# Patient Record
Sex: Female | Born: 1935 | Race: Black or African American | Hispanic: No | Smoking: Former smoker
Health system: Southern US, Community
[De-identification: ages and names within clinical notes are randomized; demographics above are authoritative.]

## PROBLEM LIST (undated history)

## (undated) DIAGNOSIS — I10 Essential (primary) hypertension: Secondary | ICD-10-CM

## (undated) DIAGNOSIS — M1990 Unspecified osteoarthritis, unspecified site: Secondary | ICD-10-CM

## (undated) DIAGNOSIS — J42 Unspecified chronic bronchitis: Secondary | ICD-10-CM

## (undated) DIAGNOSIS — Z8719 Personal history of other diseases of the digestive system: Secondary | ICD-10-CM

## (undated) DIAGNOSIS — E7800 Pure hypercholesterolemia, unspecified: Secondary | ICD-10-CM

## (undated) DIAGNOSIS — F0390 Unspecified dementia without behavioral disturbance: Secondary | ICD-10-CM

## (undated) DIAGNOSIS — M109 Gout, unspecified: Secondary | ICD-10-CM

## (undated) DIAGNOSIS — J449 Chronic obstructive pulmonary disease, unspecified: Secondary | ICD-10-CM

## (undated) DIAGNOSIS — K219 Gastro-esophageal reflux disease without esophagitis: Secondary | ICD-10-CM

## (undated) HISTORY — PX: CATARACT EXTRACTION W/ INTRAOCULAR LENS  IMPLANT, BILATERAL: SHX1307

## (undated) HISTORY — PX: HIP FRACTURE SURGERY: SHX118

---
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
## 1998-10-09 ENCOUNTER — Other Ambulatory Visit: Admission: RE | Admit: 1998-10-09 | Discharge: 1998-10-09 | Payer: Self-pay | Admitting: Internal Medicine

## 1999-02-05 ENCOUNTER — Ambulatory Visit (HOSPITAL_COMMUNITY): Admission: RE | Admit: 1999-02-05 | Discharge: 1999-02-05 | Payer: Self-pay | Admitting: Internal Medicine

## 2001-09-07 ENCOUNTER — Other Ambulatory Visit: Admission: RE | Admit: 2001-09-07 | Discharge: 2001-09-07 | Payer: Self-pay | Admitting: Internal Medicine

## 2001-09-08 ENCOUNTER — Encounter: Payer: Self-pay | Admitting: Internal Medicine

## 2001-09-08 ENCOUNTER — Encounter: Admission: RE | Admit: 2001-09-08 | Discharge: 2001-09-08 | Payer: Self-pay | Admitting: Internal Medicine

## 2004-08-13 ENCOUNTER — Ambulatory Visit (HOSPITAL_COMMUNITY): Admission: RE | Admit: 2004-08-13 | Discharge: 2004-08-13 | Payer: Self-pay | Admitting: *Deleted

## 2004-09-27 ENCOUNTER — Emergency Department (HOSPITAL_COMMUNITY): Admission: EM | Admit: 2004-09-27 | Discharge: 2004-09-27 | Payer: Self-pay | Admitting: Emergency Medicine

## 2005-01-22 ENCOUNTER — Inpatient Hospital Stay (HOSPITAL_COMMUNITY): Admission: EM | Admit: 2005-01-22 | Discharge: 2005-01-24 | Payer: Self-pay | Admitting: Emergency Medicine

## 2005-10-07 ENCOUNTER — Encounter: Admission: RE | Admit: 2005-10-07 | Discharge: 2005-10-07 | Payer: Self-pay | Admitting: Internal Medicine

## 2006-11-12 ENCOUNTER — Inpatient Hospital Stay (HOSPITAL_COMMUNITY): Admission: EM | Admit: 2006-11-12 | Discharge: 2006-11-17 | Payer: Self-pay | Admitting: Emergency Medicine

## 2007-01-29 ENCOUNTER — Encounter: Admission: RE | Admit: 2007-01-29 | Discharge: 2007-01-29 | Payer: Self-pay | Admitting: Internal Medicine

## 2007-07-09 ENCOUNTER — Emergency Department (HOSPITAL_COMMUNITY): Admission: EM | Admit: 2007-07-09 | Discharge: 2007-07-09 | Payer: Self-pay | Admitting: Family Medicine

## 2007-07-12 ENCOUNTER — Inpatient Hospital Stay (HOSPITAL_COMMUNITY): Admission: EM | Admit: 2007-07-12 | Discharge: 2007-07-14 | Payer: Self-pay | Admitting: Emergency Medicine

## 2008-04-16 ENCOUNTER — Emergency Department (HOSPITAL_COMMUNITY): Admission: EM | Admit: 2008-04-16 | Discharge: 2008-04-16 | Payer: Self-pay | Admitting: Emergency Medicine

## 2009-09-23 ENCOUNTER — Emergency Department (HOSPITAL_COMMUNITY): Admission: EM | Admit: 2009-09-23 | Discharge: 2009-09-23 | Payer: Self-pay | Admitting: Emergency Medicine

## 2010-01-22 ENCOUNTER — Encounter (INDEPENDENT_AMBULATORY_CARE_PROVIDER_SITE_OTHER): Payer: Self-pay | Admitting: Internal Medicine

## 2010-01-22 ENCOUNTER — Ambulatory Visit (HOSPITAL_COMMUNITY): Admission: RE | Admit: 2010-01-22 | Discharge: 2010-01-22 | Payer: Self-pay | Admitting: Internal Medicine

## 2010-09-08 ENCOUNTER — Encounter: Payer: Self-pay | Admitting: Internal Medicine

## 2010-10-11 ENCOUNTER — Emergency Department (HOSPITAL_COMMUNITY): Payer: Medicare HMO

## 2010-10-11 ENCOUNTER — Encounter (HOSPITAL_COMMUNITY): Payer: Self-pay | Admitting: Radiology

## 2010-10-11 ENCOUNTER — Inpatient Hospital Stay (HOSPITAL_COMMUNITY)
Admission: EM | Admit: 2010-10-11 | Discharge: 2010-10-15 | DRG: 195 | Disposition: A | Discharge status: Home Health | Payer: Medicare HMO | Attending: Internal Medicine | Admitting: Internal Medicine

## 2010-10-11 DIAGNOSIS — M109 Gout, unspecified: Secondary | ICD-10-CM | POA: Diagnosis present

## 2010-10-11 DIAGNOSIS — D638 Anemia in other chronic diseases classified elsewhere: Secondary | ICD-10-CM | POA: Diagnosis present

## 2010-10-11 DIAGNOSIS — I10 Essential (primary) hypertension: Secondary | ICD-10-CM | POA: Diagnosis present

## 2010-10-11 DIAGNOSIS — J449 Chronic obstructive pulmonary disease, unspecified: Secondary | ICD-10-CM | POA: Diagnosis present

## 2010-10-11 DIAGNOSIS — E876 Hypokalemia: Secondary | ICD-10-CM | POA: Diagnosis present

## 2010-10-11 DIAGNOSIS — E785 Hyperlipidemia, unspecified: Secondary | ICD-10-CM | POA: Diagnosis present

## 2010-10-11 DIAGNOSIS — J189 Pneumonia, unspecified organism: Principal | ICD-10-CM | POA: Diagnosis present

## 2010-10-11 DIAGNOSIS — J4489 Other specified chronic obstructive pulmonary disease: Secondary | ICD-10-CM | POA: Diagnosis present

## 2010-10-11 HISTORY — DX: Essential (primary) hypertension: I10

## 2010-10-11 LAB — COMPREHENSIVE METABOLIC PANEL
Albumin: 3.2 g/dL — ABNORMAL LOW (ref 3.5–5.2)
CO2: 25 mEq/L (ref 19–32)
Calcium: 9.6 mg/dL (ref 8.4–10.5)
Chloride: 101 mEq/L (ref 96–112)
GFR calc Af Amer: 48 mL/min — ABNORMAL LOW (ref >60)
GFR calc non Af Amer: 40 mL/min — ABNORMAL LOW (ref >60)
Total Bilirubin: 1 mg/dL (ref 0.3–1.2)
Total Protein: 6.7 g/dL (ref 6.0–8.3)

## 2010-10-11 LAB — URINE MICROSCOPIC-ADD ON

## 2010-10-11 LAB — RAPID URINE DRUG SCREEN, HOSP PERFORMED
Barbiturates: NOT DETECTED
Cocaine: NOT DETECTED
Opiates: NOT DETECTED

## 2010-10-11 LAB — CBC
HCT: 35.4 % — ABNORMAL LOW (ref 36.0–46.0)
MCV: 90.1 fL (ref 78.0–100.0)
Platelets: 289 10*3/uL (ref 150–400)
RDW: 14 % (ref 11.5–15.5)
WBC: 9.9 10*3/uL (ref 4.0–10.5)

## 2010-10-11 LAB — URINALYSIS, ROUTINE W REFLEX MICROSCOPIC
Hgb urine dipstick: NEGATIVE
Ketones, ur: 15 mg/dL — AB
Nitrite: NEGATIVE
Urine Glucose, Fasting: NEGATIVE mg/dL

## 2010-10-11 LAB — DIFFERENTIAL
Basophils Absolute: 0 10*3/uL (ref 0.0–0.1)
Lymphs Abs: 1.2 10*3/uL (ref 0.7–4.0)
Monocytes Relative: 11 % (ref 3–12)

## 2010-10-11 LAB — MRSA PCR SCREENING: MRSA by PCR: NEGATIVE

## 2010-10-11 LAB — TROPONIN I: Troponin I: 0.03 ng/mL (ref 0.00–0.06)

## 2010-10-11 LAB — POCT CARDIAC MARKERS: Myoglobin, poc: 131 ng/mL (ref 12–200)

## 2010-10-11 LAB — CK TOTAL AND CKMB (NOT AT ARMC): CK, MB: 1.8 ng/mL (ref 0.3–4.0)

## 2010-10-12 LAB — CBC
Hemoglobin: 10.1 g/dL — ABNORMAL LOW (ref 12.0–15.0)
MCH: 29.2 pg (ref 26.0–34.0)
MCHC: 32.7 g/dL (ref 30.0–36.0)
MCV: 89.3 fL (ref 78.0–100.0)
Platelets: 252 10*3/uL (ref 150–400)
RDW: 14.1 % (ref 11.5–15.5)
WBC: 9.4 10*3/uL (ref 4.0–10.5)

## 2010-10-12 LAB — BASIC METABOLIC PANEL: Calcium: 8.9 mg/dL (ref 8.4–10.5)

## 2010-10-12 LAB — CARDIAC PANEL(CRET KIN+CKTOT+MB+TROPI)
Relative Index: INVALID (ref 0.0–2.5)
Total CK: 44 U/L (ref 7–177)
Total CK: 51 U/L (ref 7–177)

## 2010-10-12 LAB — HEMOGLOBIN A1C: Mean Plasma Glucose: 108 mg/dL (ref <117)

## 2010-10-12 LAB — LIPID PANEL
HDL: 71 mg/dL (ref >39)
LDL Cholesterol: 60 mg/dL (ref 0–99)
Total CHOL/HDL Ratio: 2 RATIO
Triglycerides: 52 mg/dL (ref <150)

## 2010-10-13 LAB — VITAMIN B12: Vitamin B-12: 285 pg/mL (ref 211–911)

## 2010-10-13 LAB — IRON AND TIBC: Iron: 28 ug/dL — ABNORMAL LOW (ref 42–135)

## 2010-10-13 LAB — RETICULOCYTES
RBC.: 3.65 MIL/uL — ABNORMAL LOW (ref 3.87–5.11)
Retic Ct Pct: 1 % (ref 0.4–3.1)

## 2010-10-13 LAB — FOLATE: Folate: >20 ng/mL

## 2010-10-14 LAB — CBC
Platelets: 294 10*3/uL (ref 150–400)
RBC: 3.5 MIL/uL — ABNORMAL LOW (ref 3.87–5.11)
WBC: 6.2 10*3/uL (ref 4.0–10.5)

## 2010-10-14 LAB — BASIC METABOLIC PANEL
Chloride: 107 mEq/L (ref 96–112)
GFR calc Af Amer: >60 mL/min (ref >60)
Potassium: 4 mEq/L (ref 3.5–5.1)

## 2010-10-14 LAB — MAGNESIUM: Magnesium: 1.9 mg/dL (ref 1.5–2.5)

## 2010-10-15 ENCOUNTER — Inpatient Hospital Stay (HOSPITAL_COMMUNITY): Payer: Medicare HMO

## 2010-10-31 NOTE — Discharge Summary (Signed)
[name], [name]              ACCOUNT NO.:  [id number]  MEDICAL RECORD NO.:  [id number]           PATIENT TYPE:  I  LOCATION:  5526                         FACILITY:  [facility]  PHYSICIAN:  Kathlen Mody, MD       DATE OF BIRTH:  1923/07/21  DATE OF ADMISSION:  10/11/2010 DATE OF DISCHARGE:  10/15/2010                              DISCHARGE SUMMARY   PRIMARY CARE PHYSICIAN:  Massie Maroon, MD  DISCHARGE DIAGNOSES: 1. Community-acquired pneumonia. 2. Hypokalemia. 3. Hypertension. 4. Hyperlipidemia. 5. Gout. 6. Anemia of chronic disease.  DISCHARGE MEDICATIONS: 1. Docusate 100 mg twice daily. 2. Avelox 400 mg for 4 more days. 3. MiraLax 17 g as needed. 4. Allopurinol 300 mg 1-1/2 tablet daily. 5. Amlodipine 5 mg half tablet daily. 6. Aspirin 325 mg one tablet daily. 7. Atenolol 25 mg twice daily. 8. Atrovent inhaler 2 puffs inhalations twice daily as needed. 9. Diovan 320 mg one tablet daily. 10.Hydrochlorothiazide 12.5 mg one capsule daily. 11.Simvastatin 40 mg 1 tablet daily.  PERTINENT LABS:  On admission, the patient had a CBC significant for hemoglobin of 11.4, hematocrit of 35.4.  Point-of-care cardiac markers negative.  Comprehensive metabolic panel showed glucose of 110, creatinine of 1.07.  Urinalysis significant for small leukocytes.  Urine drug screen negative.  MRSA PCR negative. TSH within normal limits. Hemoglobin A1c 5.4.  Anemia panel showed ferritin of 220.  On the day of discharge, the patient's CBC showed a hemoglobin of 10.3, hematocrit of 31.4, WBC count of 6.2, platelets of 294.  Basic metabolic panel within normal limits.  RADIOLOGY:  The patient had a chest x-ray on October 11, 2010, showed focal areas of abnormal density overlying the descending thoracic aorta in the lateral view, patchy area of infiltrate.  CT chest without contrast showed focal consolidation in the medial and upper lobe appearance compatible with multifocal bronchopneumonia.   Followup imaging is suggested after completion of therapy to document resolution.  BRIEF HOSPITAL COURSE:  An 77 year old lady with past medical history of hypertension, hyperlipidemia, anemia, bronchitis, gout, was admitted for cough, shortness of breath, and chest x-ray was found to have multifocal areas of bronchopneumonia, started on Avelox.  She also had a CT chest to further identify the abnormal chest x-ray, which showed multiple areas of bronchopneumonia with hospitalization.  Her cough and shortness of breath have been improved and she was discharged with 4 more days of Avelox to complete the course of antibiotic.  Hypertension controlled with her home medications of Diovan, hydrochlorothiazide, amlodipine.  Hyperlipidemia.  Continue with simvastatin.  Adrenal insufficiency.  She was slightly dehydrated which resolved after IV hydration and her creatinine normalized.  Anemia.  Anemia most likely secondary to her anemia of chronic disease with normal folate, B12, retic count, and ferritin.  PHYSICAL EXAMINATION:  VITAL SIGNS:  On the day of discharge, the patient's vitals are temperature of 98.2, pulse of 76, blood pressure 150/95, respirations 18, saturating 99% on room air. GENERAL:  She is alert, afebrile, oriented x3. CARDIOVASCULAR:  S1-S2 heard.  No rubs, murmurs.  No gallops. RESPIRATORY:  Good air entry bilaterally. ABDOMEN:  Soft,  nontender, nondistended. Good bowel sounds. EXTREMITIES:  No pedal edema.  FOLLOWUP:  The patient was recommended to follow up with her PCP in about 1-2 weeks and also recommended to get a repeat chest x-ray to see the resolution of her pneumonia.  The patient also went to PT/OT evaluation who recommended discharge home with home PT.          ______________________________ Kathlen Mody, MD     VA/MEDQ  D:  10/30/2010  T:  10/31/2010  Job:  [id number]  Electronically Signed by Kathlen Mody MD on 10/31/2010 12:18:30 PM

## 2010-11-14 NOTE — H&P (Signed)
[name], [name]              ACCOUNT NO.:  [id number]  MEDICAL RECORD NO.:  [id number]           PATIENT TYPE:  E  LOCATION:  [facility]                         FACILITY:  [facility]  PHYSICIAN:  Peggye Pitt, M.D. DATE OF BIRTH:  1923/06/26  DATE OF ADMISSION:  10/11/2010 DATE OF DISCHARGE:                             HISTORY & PHYSICAL   PRIMARY CARE PHYSICIAN:  Massie Maroon, MD  CHIEF COMPLAINT:  Chest pain, cough and shortness of breath.  HISTORY OF PRESENT ILLNESS:  [name] is a very pleasant 77 year old African American lady who has a history significant for hypertension, chronic bronchitis and gout who presents to the hospital today for chest pain and shortness of breath.  The patient states her symptoms started about 10 days ago when she started having a nonproductive cough.  She attributed this to her chronic bronchitis and did not pay much attention to it.  However over the last 2 days, she has started to have a cough productive of greenish sputum.  Congestive with this, she noted chest pain "all over her chest" with no specific radiation, but she noticed that it was worse with deep inspiration.  Chest pain has no relation to exertion.  She denies any fevers or chills.  Denies any recent travel or sick contacts.  ALLERGIES:  She has stated allergies to PENICILLIN.  PAST MEDICAL HISTORY: 1. Significant for hypertension. 2. Chronic bronchitis. 3. Gouty arthropathy. 4. Hyperlipidemia.  HOME MEDICATIONS: 1. Norvasc 5 mg daily. 2. Atrovent inhaler 2 puffs twice daily as needed for shortness of     breath. 3. Atenolol 25 mg twice daily. 4. Allopurinol 300 mg one and half tablet daily. 5. Tylenol No. 3 one tablet every 8 hours as needed for pain. 6. Diovan 320 mg daily. 7. Hydrochlorothiazide 12.5 mg daily. 8. Simvastatin 40 mg daily. 9. Aspirin 325 mg daily.  SOCIAL HISTORY:  She denies any alcohol or illicit drug use.  She used to be a former smoker of about  a pack a day for many years, but quit 3 years ago.  FAMILY HISTORY:  States that the mother and father had hypertension, but otherwise nonsignificant.  REVIEW OF SYSTEMS:  Negative except as mentioned in history of present illness.  PHYSICAL EXAM:  VITAL SIGNS:  On admission, blood pressure 93/60, heart rate 74, respirations 16, sats of 97%, on room air and temperature of 97.6. GENERAL:  She is alert, awake, oriented x3, does not appear to be in any distress.  She is pleasant and cooperative with my exam.  She can speak in full sentences without any obvious respiratory distress. HEENT:  Normocephalic, atraumatic.  Pupils are equally round and reactive to light and accommodation.  She has intact extraocular movements. NECK:  Supple.  No JVD, no lymphadenopathy, no bruits, no goiter. HEART:  Regular rate and rhythm.  I cannot auscultate any murmurs, rubs or gallops. LUNGS:  She does not have any wheezes, but she does have significant rhonchi, most noticeable at the left upper lung fields. ABDOMEN:  Soft, nontender, nondistended.  Positive bowel sounds. EXTREMITIES:  No clubbing, cyanosis or edema with positive pedal  pulses. NEUROLOGIC:  Grossly intact and nonfocal although I have not ambulated her.  LABS ON ADMISSION: 1. Sodium 137, potassium 3.5, chloride 101, bicarb 25, BUN 16,     creatinine 1.27, glucose of 110.  All of her LFTs are within normal     limits with the exception of a slightly low albumin of 3.2.  WBC is     9.9, hemoglobin 11.4 and platelets of 289.  First set of point of     care markers is negative.  Urinalysis is negative.  A chest x-ray     that shows a questionable pneumonia versus mass. 2. She had an EKG that showed normal sinus rhythm at a rate of around     78.  She has normal axis and no acute ST or T-wave changes.  ASSESSMENT AND PLAN: 1. Possible pneumonia.  She does have some clinical criteria for     pneumonia including her productive cough.  With  her chest x-ray     findings, I will go ahead and treat her with Avelox 400 mg daily.     I will ask for sputum cultures.  Question of lung mass.  I will go     ahead and get a CT scan of the chest to further identify. 2. Hypotension.  The patient's blood pressure upon arrival was 93/60     that went up to 106/76 after 500 mL of saline in the ED.  At this     point, I will elect to withhold all of her antihypertensive     medications until her blood pressure rises. 3. Hyperlipidemia.  We will check a fasting lipid profile.  We will     continue her simvastatin. 4. Chronic kidney disease.  Upon review of her records, it appears     that her creatinine has been on the 1.2-1.4 range since 2009.  I     will not pursue any further workup for her kidney disease at this     time. 5. Prophylaxis.  I will place her on Lovenox for DVT prophylaxis.     Peggye Pitt, M.D.     EH/MEDQ  D:  10/11/2010  T:  10/11/2010  Job:  [id number]  cc:   Massie Maroon, MD  Electronically Signed by Peggye Pitt M.D. on 11/14/2010 07:19:59 AM

## 2010-12-31 NOTE — Discharge Summary (Signed)
NAMEEUNICE, WINECOFF              ACCOUNT NO.:  [id number]   MEDICAL RECORD NO.:  [id number]          PATIENT TYPE:  INP   LOCATION:  5714                         FACILITY:  [facility]   PHYSICIAN:  Wilson Singer, M.D.DATE OF BIRTH:  [birth year]   DATE OF ADMISSION:  07/11/2007  DATE OF DISCHARGE:  07/14/2007                               DISCHARGE SUMMARY   FINAL DIAGNOSES:  1. Left hand cellulitis secondary to methicillin-resistant      staphylococcus aureus.  2. Hypertension, controlled.   CONDITION ON DISCHARGE:  Stable.   MEDICATIONS ON DISCHARGE:  1. Atenolol 25 mg daily.  2. Caduet 5/20 one tablet daily.  3. Micardis 80 mg daily.  4. Allopurinol 300 mg daily.  5. Zoloft 50 mg daily.  6. Doxycycline 100 mg b.i.d. for another 1 week.   HISTORY:  This 77 year old lady came in with swelling and redness and  pain of the left hand for 4 days prior to admission.  Please see initial  history and physical examination by Dr. Della Goo.   HOSPITAL PROGRESS:  The patient was admitted and put on intravenous  vancomycin as the antibiotic of choice.  There was a question as to  whether she also had gout.  However, the culture from the incision and  drainage that was done in the emergency room proved to be MRSA which was  sensitive to doxycycline and vancomycin.  She had already restarted  doxycycline prior to admission by her primary care physician, and once  the area was incised and drained, her hand did improve considerably.  Today, she is feeling well.  She is afebrile, hemodynamically stable,  and the left hand looks much improved with some pus draining still.   FINAL DISPOSITION:  I have told her to continue with another 5-7 days of  doxycycline, and she has a course of the doxycycline already from her  primary care physician which she must finish.  She also has an  appointment to see her primary care physician in the next few days, and  he may need to give her an  extended course.  In the meantime, she should  continue wound care dressing changes every day.      Wilson Singer, M.D.  Electronically Signed     NCG/MEDQ  D:  07/14/2007  T:  07/14/2007  Job:  [id number]

## 2010-12-31 NOTE — H&P (Signed)
NAMETERRIAH, [name]              ACCOUNT NO.:  [id number]   MEDICAL RECORD NO.:  [id number]          PATIENT TYPE:  INP   LOCATION:  5705                         FACILITY:  [facility]   PHYSICIAN:  Della Goo, M.D. DATE OF BIRTH:  09/29/1922   DATE OF ADMISSION:  07/12/2007  DATE OF DISCHARGE:                              HISTORY & PHYSICAL   PRIMARY CARE PHYSICIAN:  Massie Maroon, MD   CHIEF COMPLAINT:  Increased redness, swelling, pain left hand.   HISTORY OF PRESENT ILLNESS:  This is an 77 year old female presenting to  the emergency department for reevaluation of a left hand cellulitis.  She reports the redness and pain has been worsening and has been going  on for 4 days.  She was seen at an urgent care 2 days ago, evaluated  placed on antibiotic therapy of doxycycline.  However, the redness and  pain and swelling continued to worsen.  She came to the emergency  department for reevaluation and was seen and evaluated by the emergency  room physician, Dr. Adriana Simas, who performed an incision and drainage on the  area, expressing a copious amount of yellowish purulent material.  The  patient was placed on IV vancomycin therapy and referred for admission.   PAST MEDICAL HISTORY:  1. Hypertension.  2. Gout.  3. Eczema.   PAST SURGICAL HISTORY:  History of an appendectomy.   MEDICATIONS:  Atenolol, allopurinol, Caduet, Tylenol #3, Albuterol,  Zoloft and doxycycline.  The patient does not know the dosages of her  medications.   ALLERGIES:  PENICILLIN.   SOCIAL HISTORY:  The patient is currently a nonsmoker.  She quit in  March 2008, and she is a nondrinker.   FAMILY HISTORY:  Positive for hypertension and positive for cancer,  unknown type in her mother.   REVIEW OF SYSTEMS:  The patient has had no nausea, vomiting, diarrhea,  fevers, chills, shortness of breath or chest pain.   PHYSICAL EXAMINATION:  GENERAL:  This is an 77 year old well-nourished,  well-developed thin  female, in no acute distress.  VITAL SIGNS:  Temperature 97.0, blood pressure 176/95, heart rate 74,  respirations 20, O2 saturations 100% on room air.  HEENT:  Normocephalic, atraumatic.  Pupils equally round, there is arcus  senilis present bilaterally.  Extraocular muscles are intact.  Funduscopic benign.  There is no scleral icterus.  Oropharynx is clear.  NECK:  Supple, full range of motion.  No thyromegaly, adenopathy,  jugular venous distention.  CARDIOVASCULAR:  Regular rate and rhythm.  No murmurs, gallops or rubs.  LUNGS:  Clear to auscultation bilaterally.  ABDOMEN:  Positive bowel sounds, soft, nontender, nondistended.  EXTREMITIES: Without cyanosis, clubbing or edema except for the left  hand.  There is marked erythema between the thumb and first digit areas  in the soft tissue between the two digits.  In that area is an area of  purple ecchymosis.  She has full range of motion of all of her digits of  the left hand.  NEUROLOGIC:  Examination alert and oriented x3.  There are no focal  deficits.   LABORATORY STUDIES:  White blood cell count 6.5, hemoglobin 11.8,  hematocrit 36.3, platelets 448, neutrophils 63%, lymphocytes 25%.  Sodium 138, potassium 3.2, chloride 104.  BUN 27, creatinine 1.4,  glucose 91.  X-ray of the left hand reveals osteoporosis and erosions at  the bases of the first and second metacarpal suspicious for gout.   ASSESSMENT:  An 77 year old female being admitted with:  1. Cellulitis/abscess of the left hand.  2. Hypertension.  3. Hypokalemia.  4. History of gout.   PLAN:  The patient will continue on IV antibiotic therapy of vancomycin.  A uric acid level will be sent to check for a possible gout flare up.  The patient's potassium will be repleted.  Her home medications will be  verified and DVT and GI prophylaxis will be ordered.      Della Goo, M.D.  Electronically Signed     HJ/MEDQ  D:  07/12/2007  T:  07/13/2007  Job:   [id number]   cc:   Massie Maroon, MD

## 2011-01-03 NOTE — H&P (Signed)
[name], [name]              ACCOUNT NO.:  [id number]   MEDICAL RECORD NO.:  [id number]          PATIENT TYPE:  INP   LOCATION:  3729                         FACILITY:  [facility]   PHYSICIAN:  Hillery Aldo, M.D.   DATE OF BIRTH:  06-28-1923   DATE OF ADMISSION:  11/12/2006  DATE OF DISCHARGE:                              HISTORY & PHYSICAL   PRIMARY CARE PHYSICIAN:  Dr. Lendell Caprice.   CHIEF COMPLAINT:  Dyspnea, cough.   HISTORY OF PRESENT ILLNESS:  The patient is an 77 year old female who  presents to the emergency department with chief complaint of cough and  progressive dyspnea.  The patient saw Dr. Lendell Caprice on Monday and was put  on prednisone at that time.  He subsequently feels worse with  intermittent fever, moist cough, and bilateral flank pain.  She denies  any chest pain.  She has had some nausea and diminished appetite.  On  initial evaluation in the emergency department, she is found to have  evidence of pneumonia on chest x-ray and is therefore being admitted for  further treatment.   PAST MEDICAL HISTORY:  1. Hypertension.  2. Gout.  3. Dyslipidemia.  4. Chronic obstructive pulmonary disease.  5. Ongoing tobacco abuse.  6. History of steroid-induced hyperglycemia.  7. Remote appendectomy.   FAMILY HISTORY:  The patient's mother died at 60 from cancer of the  uterus.  The patient's father died at 2 from stroke.  He has a history  of hypertension.  She has two siblings who have had hyperlipidemia.   SOCIAL HISTORY:  The patient is widowed for 10 years.  She currently  lives alone.  She smokes one half to one third pack of tobacco daily.  She denies any alcohol use.  She has no children.   ALLERGIES:  PENICILLIN CAUSES A RASH.   CURRENT MEDICATIONS:  1. Prednisone 5 mg taper.  2. Atenolol 25 mg daily.  3. Allopurinol 450 mg daily.  4. Hydrochlorothiazide 25 mg daily.  5. Lipitor 10 mg daily.  6. Tylenol with Codeine p.r.n.  7. Vicodin p.r.n.   REVIEW  OF SYSTEMS:  As noted in the elements of the HPI above.  Otherwise, the patient does complain of chronic constipation.  She has  not had any melena or hematochezia.  She has not had any dysuria.   PHYSICAL EXAM:  VITAL SIGNS :  Temperature 100.6, pulse 98, respirations  28, blood pressure 120/74, O2 saturation 92% on room air.  GENERAL:  This is a frail, elderly thin female who is in no acute  distress.  HEENT:  Normocephalic, atraumatic.  PERRL.  Capillary EOMI.  Oropharynx  reveals dry mucous membranes.  There is arcus senilis bilaterally.  NECK:  Supple, no thyromegaly, no lymphadenopathy, no jugular venous  distension.  CHEST:  The patient has coarse expiratory wheezes and scattered rhonchi  in both lung fields.  HEART:  Tachycardiac rate, regular rhythm.  No murmurs, rubs, or  gallops.  ABDOMEN:  Soft, nontender, nondistended with normoactive bowel sounds.  EXTREMITIES:  No clubbing, edema, cyanosis.  SKIN:  Warm and dry.  No rashes.  NEUROLOGIC:  The patient is alert and oriented x3.  Cranial nerves II-  XII grossly intact.  Nonfocal.   DATA REVIEW:  CHEST X-RAY:  Shows multifocal airspace disease within the  left lung, concerning for infection.  There is also changes of chronic  interstitial lung disease.  Follow-up radiography is recommended to rule  out neoplasm.   A 12-lead EKG shows normal sinus rhythm at 100 beats per minute.  There  is biphasic P-waves, consistent with left atrial enlargement.  There are  T-wave inversions in lead III, aVF, V5 and V6 concerning for  inferolateral ischemia.   LABORATORY DATA:  White blood cell count is 10.3, hemoglobin 13, and  hematocrit 39.5, platelets 282.  Sodium is 126, potassium 3.9, chloride  101, bicarb 26.8, BUN 34, creatinine 1.2, glucose 121.   ASSESSMENT/PLAN:  1. Acute infectious exacerbation of chronic obstructive pulmonary      disease/left lung pneumonia:  We will admit the patient and start      her on Avelox, given  her allergy to penicillin.  We will attempt to      obtain sputum Gram stain and culture.  Will obtain blood cultures      x2.  Will start her on Solu-Medrol at 60 mg IV q.6 h for her      significant bronchospasm.  Will start her on bronchodilator      therapy, Mucinex, and Robitussin p.r.n.  2. Hyponatremia:  The patient's hyponatremia may be reflective of her      acute lung process, or it could be a harbinger of an underlying      malignancy.  At this juncture, I will start her on normal saline to      correct her sodium and monitor her electrolytes closely.  She will      likely need a CT scan of the chest at some point during this      hospitalization to rule out an underlying neoplasm, given her      history of tobacco abuse.  3. T-wave inversions in the inferolateral leads on 12-lead EKG:  The      patient is not complaining of any chest pain.  Nevertheless, I will      start her on aspirin therapy and cycle her cardiac enzymes q.8 h      x3.  Will also check her TSH level and BNP level.  Will obtain a 12-      lead EKG again in the morning.  If indicated, would consider      cardiology consultation.  4. Dehydration:  The patient does have evidence of mild dehydration,      based on her BUN to creatinine ratio.  I will start her on normal      saline at 75 mL an hour for 2 liters, and then KVO her fluids.  5. History of steroid-induced hyperglycemia:  Will check the patient's      CBG, q.a.c. and h.s. and cover her with sliding scale insulin as      needed.  6. Hypertension:  Will hold the patient's hydrochlorothiazide, given      her dehydration.  Would continue the atenolol but if her      bronchospasm does not respond, would hold this as well.  7. Dyslipidemia:  Will continue the patient's Lipitor at 10 mg daily.      Will check a fasting lipid panel in the      morning to ensure that she is  adequately controlled. 8. Gout:  Continue the patient's allopurinol at her usual dose  and      Tylenol with Codeine as needed for joint pain.  9. Prophylaxis:  Initiate GI prophylaxis with Protonix and DVT      prophylaxis with Lovenox.      Hillery Aldo, M.D.  Electronically Signed     CR/MEDQ  D:  11/12/2006  T:  11/12/2006  Job:  [id number]   cc:   Dr. Lendell Caprice

## 2011-01-03 NOTE — Op Note (Signed)
NAMEAMIT, MELOY              ACCOUNT NO.:  [id number]   MEDICAL RECORD NO.:  [id number]          PATIENT TYPE:  AMB   LOCATION:  ENDO                         FACILITY:  [facility]   PHYSICIAN:  Georgiana Spinner, M.D.    DATE OF BIRTH:  [birth year]   DATE OF PROCEDURE:  08/13/2004  DATE OF DISCHARGE:                                 OPERATIVE REPORT   PROCEDURE:  Upper endoscopy.   ENDOSCOPIST:  Georgiana Spinner, M.D.   ANESTHESIA:  Demerol 30 mg, Versed 5 mg.   PROCEDURE:  With the patient mildly sedated in the left lateral decubitus  position, the Olympus videoscopic endoscope was inserted in the mouth and  passed under direct vision through the esophagus, which appeared normal,  into the stomach.  Fundus, body and antrum appeared somewhat atrophic.  Duodenal bulb was visualized and appeared normal.  From this point, the  endoscope was slowly withdrawn, taking circumferential views of the duodenal  mucosa until the endoscope had been pulled back and the scope had been  placed in the retroflexion to view the stomach from below.  The endoscope  was straightened and withdrawn, taking circumferential views of the  remaining gastric and esophageal mucosa.  The patient's vital signs and  pulse oximetry remained stable.  The patient tolerated the procedure well  without apparent complications.   FINDINGS:  Atrophic stomach, otherwise, unremarkable exam.   PLAN:  Proceed to colonoscopy.       GMO/MEDQ  D:  08/13/2004  T:  08/13/2004  Job:  [id number]

## 2011-01-03 NOTE — Op Note (Signed)
NAMEELINA, STRENG              ACCOUNT NO.:  [id number]   MEDICAL RECORD NO.:  [id number]          PATIENT TYPE:  AMB   LOCATION:  ENDO                         FACILITY:  [facility]   PHYSICIAN:  Georgiana Spinner, M.D.    DATE OF BIRTH:  [birth year]   DATE OF PROCEDURE:  DATE OF DISCHARGE:                                 OPERATIVE REPORT   PROCEDURE:  Colonoscopy.   ENDOSCOPIST:  Georgiana Spinner, M.D.   INDICATIONS:  Colon cancer screening.   ANESTHESIA:  Demerol 20, Versed 2.5 mg.   DESCRIPTION OF PROCEDURE:  With the patient mildly sedated in the left  lateral decubitus position, the Olympus videoscopic colonoscope was inserted  in the rectum and passed under direct vision through a tortuous colon to the  cecum, identified by the ileocecal valve and base of cecum.  The appendiceal  orifice was visualized at one point.  From this point, total hip replacement  colonoscopy was slowly withdrawn taking circumferential views of the colonic  mucosa stopping only in the rectum which appeared normal on direct and  retroflexed view.  The endoscope was straightened and withdrawn.  The  patient's vital signs and pulse oximeter remained stable.  The patient  tolerated the procedure well without apparent complications.   FINDINGS:  Somewhat floppy colon but otherwise an unremarkable examination  other than pandiverticulosis.   PLAN:  Have the patient follow up with me as an outpatient as needed.       GMO/MEDQ  D:  08/13/2004  T:  08/13/2004  Job:  [id number]

## 2011-01-03 NOTE — Discharge Summary (Signed)
[name], [name]              ACCOUNT NO.:  [id number]   MEDICAL RECORD NO.:  [id number]          PATIENT TYPE:  INP   LOCATION:  3729                         FACILITY:  [facility]   PHYSICIAN:  Hillery Aldo, M.D.   DATE OF BIRTH:  Dec 28, 1922   DATE OF ADMISSION:  11/12/2006  DATE OF DISCHARGE:  11/17/2006                               DISCHARGE SUMMARY   PRIMARY CARE PHYSICIAN:  Marcy Salvo C. Lendell Caprice, M.D.   DISCHARGE DIAGNOSES:  1. Pneumonia.  2. Chronic obstructive pulmonary disease with acute exacerbation.  3. Hyponatremia.  4. Electrocardiogram changes, cardiac enzymes negative.  5. Dehydration.  6. Steroid-induced hyperglycemia.  7. Dyslipidemia.  8. Mild normocytic anemia.  9. Gout.  10.Hypokalemia.  11.Hypertension.   DISCHARGE MEDICATIONS:  1. Prednisone taper, start at 60 mg daily and decrease by 10 mg daily      until off.  2. Avelox 400 mg daily x5 more days.  3. Atenolol 25 mg b.i.d.  4. Allopurinol 450 mg daily.  5. Hydrochlorothiazide 25 mg daily.  6. Lipitor 10 mg daily.  7. Tylenol with Codeine p.r.n.  8. Desoximetasone 0.25% cream to hands b.i.d.   CONSULTATIONS:  None.   PROCEDURES AND DIAGNOSTIC STUDIES:  1. Chest x-ray on November 12, 2006 showed multifocal airspace disease      within the left lung concerning for infection.  Followup imaging      recommended to ensure resolution as well as to rule out underlying      malignancy.  Chronic interstitial lung disease.  2. CT scan of the chest on November 13, 2006 showed patchy opacities      throughout both lungs, most severe in the left lower lobe.  There      was minimal cavitation suspected in a left upper lobe opacity.      Favored etiology was considered to be infection.  Less likely      differential considerations included interstitial lung diseases      such as organizing pneumonia.  Recommendations were for a minimal      radiographic followup until clearance.  Specifically, we recommend      a  CT scan be performed once the patient has completed antibiotic      therapy and is clinically improved to confirm resolution.  No      findings to suggest typical presentation of primary bronchogenic      carcinoma or uterine cancer metastasis.  Nonspecific subpleural      reticulation with an upper lobe predominance.  Changes of COPD.      Mild common duct dilatation, incompletely imaged.  When correlated      with abdominal complaints and bilirubin level, there was no      evidence for acute cholecystitis.  There is mild precarinal      adenopathy which was thought to be reactive.   DISCHARGE LABORATORY DATA:  White blood cell count was 8.1, hemoglobin  12.1, hematocrit 35.9, platelets 429.  Sodium was 136, potassium 4.0,  chloride 96, bicarb 30, BUN 21, creatinine 0.93, glucose 119.   HOSPITAL COURSE BY PROBLEM:  Problem 1.  Pneumonia with an acute COPD  exacerbation due to underlying infection.  The patient was admitted and  put empirically on IV Avelox given her allergy to penicillins.  Blood  cultures were obtained x2 and were negative.  Attempts to culture sputum  were not able to be performed secondary to the patient's inability to  provide a sample.  She was also put on nebulized bronchodilator therapy,  Mucinex, Robitussin, and IV Solu-Medrol which was rapidly tapered.  The  patient's respiratory status improved dramatically with these  treatments, and at this time she is stable for discharge home.  Again, a  followup CT is recommended after completion of antibiotic therapy and  resolution of her symptoms to ensure that her x-ray findings have  cleared.   Problem 2.  Hyponatremia.  The patient was mildly hyponatremic on  admission with a sodium of 126.  She was put on IV fluid rehydration  with normal saline and this corrected rapidly and has remained corrected  even though the patient is no longer on IV fluids.   Problem 3.  EKG changes.  The patient did have some  abnormalities on her  12-lead EKG.  Specifically, she had nonspecific T-wave changes.  Due to  this, cardiac enzymes were cycled q.8h. x3 and were negative.  The  patient did not have any complaints of chest pain.   Problem 4.  Dehydration.  The patient did have an elevated BUN to  creatinine ratio thought to be secondary to mild dehydration.  Her  initial BUN and creatinine were 34 and 1.2, respectively.  This has  improved with IV fluid rehydration, and she is likely back to her  baseline in terms of renal function.   Problem 5.  Steroid-induced hyperglycemia.  The patient did have mild  hyperglycemia exacerbated by steroids.  She was given sliding scale  insulin before meals and at bedtime to address this issue.  Her glycemic  control should return to normal once her steroids are tapered off.   Problem 6.  Dyslipidemia.  The patient's fasting lipid panel was  checked, and she was found to have excellent control on statin therapy.  Her total cholesterol was 131, triglycerides 112, HDL 66, and LDL 43.   Problem 7.  Normocytic anemia.  The patient had very mild normocytic  anemia which actually corrected prior to discharge.  Further  surveillance can be done by her primary care physician.   Problem 8.  Gouty arthritis.  The patient was continued on her usual  allopurinol therapy and did not have any acute flares of her gouty  arthritis during the course of her hospitalization.   Problem 9.  Hypokalemia.  The patient's potassium reached a nadir of  3.2.  She was appropriately repleted, and her discharge potassium is  4.0.   Problem 10.  Hypertension.  The patient did have inadequately controlled  blood pressure, prompting [place] to increase her atenolol dose to 25 mg  b.i.d.  Additionally, her hydrochlorothiazide was initially held  secondary to dehydration.  This was restarted.  Her discharge blood pressure is 153/90.  She will need close followup with her primary care  physician to  ensure that her blood pressure normalizes once her steroids  have been tapered off.   DISPOSITION:  The patient is stable for discharge home.  She will be  sent home with a rolling walker, physical therapy, occupational therapy,  home health nursing and an aid to help with the transition.  Hillery Aldo, M.D.  Electronically Signed     CR/MEDQ  D:  11/17/2006  T:  11/17/2006  Job:  [id number]   cc:   Janae Bridgeman. Eloise Harman., M.D.

## 2011-01-03 NOTE — Discharge Summary (Signed)
[name], [name]              ACCOUNT NO.:  [id number]   MEDICAL RECORD NO.:  [id number]          PATIENT TYPE:  INP   LOCATION:  6733                         FACILITY:  [facility]   PHYSICIAN:  Mobolaji B. Bakare, M.D.DATE OF BIRTH:  18-Dec-1922   DATE OF ADMISSION:  01/22/2005  DATE OF DISCHARGE:  01/24/2005                                 DISCHARGE SUMMARY   PRIMARY CARE PHYSICIAN:  Dr. Dimas Alexandria   FINAL DIAGNOSES:  1.  Right middle lobe and lower lobe pneumonia.  2.  Tobacco abuse.  3.  Chronic obstructive pulmonary disease exacerbation.  4.  Hyperglycemia secondary to prednisone and hyperkalemia.   SECONDARY DIAGNOSES:  1.  Hypertension.  2.  Hyperlipidemia.   CHIEF COMPLAINT:  Shortness of breath for 3 days.   BRIEF HISTORY:  [name] is a pleasant 77 year old African-American  female who presents with shortness of breath for 3 days prior to  presentation.  There was no accompanying fever, no GI symptoms, no chest  pain.  She had a chest x-ray which showed right middle and lower lobe  infiltrate compatible with pneumonia and she was admitted for further  treatment.   PERTINENT PHYSICAL FINDINGS:  INITIAL VITALS:  Blood pressure of 52/90,  subsequently improved.  Pulse 92, respiratory rate 24, O2 saturations of 94%  on room air.  GENERAL:  On examination she is younger than stated age, was not in any  acute respiratory distress.  RESPIRATORY:  Main findings was on respiratory examination.  There was  bibasilar crackles and bilateral respiratory wheeze.  CVS:  S1 and S2 tachycardia.  ABDOMEN:  Benign.  EXTREMITIES:  Without edema or cyanosis.   PERTINENT LABORATORY FINDINGS:  Sodium 133, potassium 3.5, chloride 102, BUN  11, glucose 112, hemoglobin 10.6, hematocrit 40, creatinine 1.2, white blood  cells 6.3, platelets 322, albumin 3.5, AST 20, ALT 11, alkaline phosphatase  70, total bilirubin 1.7.   HOSPITAL COURSE:  [name] was admitted for IV  antibiotics and  nebulization.  She was started on Avelox and IV Solu-Medrol with around-the-  clock nebulization.  She was counseled on smoking cessation.  Patient was  receptive to quitting smoking and she was started on nicotine patch.  Overall she did well and was discharged home in a stable condition on the  following medications:  Avelox 400 mg p.o. once daily, Tussionex 5 mL p.o.  b.i.d. for 5 days, Combivent inhaler two puffs four times a day, clonidine  0.3 mg p.o. at bedtime, __________ p.r.n., Atenolol 25 mg one p.o. once  daily, Lipitor 10 mg one once daily, Allopurinol __________, Daypro 600 mg  use as needed, nicotine patch 21 mg one p.o. once daily, prednisone taper.  Diet:  Low salt diet, low cholesterol diet.  Follow up with Dr. Lendell Caprice in  1-2 weeks.   ADDENDUM:  Patient was evaluated for Pneumovax vaccine and she was unsure of  when she last took Pneumovax; she thinks it was within the past 10 years.  In any case, she was offered Pneumovax vaccine and she refused.  I  encouraged her to follow up  with Dr. Lendell Caprice in the office.       MBB/MEDQ  D:  02/01/2005  T:  02/01/2005  Job:  [id number]   cc:   Janae Bridgeman. Eloise Harman., M.D.  [address]  [place]  [place] [address]  Fax: [phone]

## 2011-01-03 NOTE — H&P (Signed)
NAMELASONYA, HUBNER NO.:  [id number]   MEDICAL RECORD NO.:  [id number]          PATIENT TYPE:  EMS   LOCATION:  MAJO                         FACILITY:  [facility]   PHYSICIAN:  Mallory Shirk, MD     DATE OF BIRTH:  1922/12/06   DATE OF ADMISSION:  01/21/2005  DATE OF DISCHARGE:                                HISTORY & PHYSICAL   CHIEF COMPLAINT:  Shortness of breath x2 days.   HISTORY OF PRESENT ILLNESS:  [name] is a very pleasant 77 year old  African-American woman who comes in with a complaint of shortness of breath,  worsening for the past two days.  The patient states that yesterday morning,  she had trouble catching her breath.  Of note, the patient still smokes  about one pack of cigarettes per day.  She is not on home oxygen.  The  patient has a history of chronic bronchitis and asthma.  The patient has no  other complaints at the present time.  No nausea, vomiting, diarrhea, no  fever, no chills or any other symptoms.   MEDICATIONS ON ADMISSION:  1.  Lasix.  2.  Clonidine.  3.  Atenolol.  4.  Allopurinol.  5.  Lipitor.  Doses unclear at this time.   PAST MEDICAL HISTORY:  1.  Gout.  2.  Asthma.  3.  Bronchitis.  4.  Hypertension.  5.  Hyperlipidemia.   ALLERGIES:  PENICILLIN.   FAMILY HISTORY:  Significant for cancer in mother, hypertension and CVA in  father, hyperlipidemia in one brother and one sister.   SOCIAL HISTORY:  The patient lives with her nephew.  Smokes one pack of  cigarettes per day.  No alcohol or drug use.   REVIEW OF SYSTEMS:  Other than HPI, negative.  More than 10 systems  reviewed.   PHYSICAL EXAM ON ADMISSION:  Blood pressure 152/90, subsequent reading  106/63, pulse 92, respirations 24, saturations 94%.  GENERAL:  Very pleasant, elderly African-American woman lying in bed in no  acute distress.  HEENT:  Normocephalic, atraumatic.  PERRL, arcus senilis noted, sclerae  anicteric.  NECK:  Supple.  No LAD.  No  JVD.  LUNGS:  Bibasilar crackles.  Bilateral wheezing.  Good air entry  bilaterally.  CARDIOVASCULAR:  S1 plus S2, tachycardic.  ABDOMEN:  Soft, positive bowel sounds, no tenderness.  No masses.  EXTREMITIES:  No cyanosis, clubbing or edema.  NEUROLOGIC:  Cranial nerves II-XII grossly intact.  Sensory and motor within  normal limits.  No focal deficits seen.   LABS:  Chest x-ray shows the right middle/lower lobe air space disease  consistent with pneumonia.  COPD pattern.   LABS ON ADMISSION:  Sodium 133, potassium 3.5, chloride 102, BUN 11, glucose  112.  Hemoglobin 13.6, hematocrit 40.0.  Creatinine 1.2.  WBC 6.0,  hemoglobin 12.9, hematocrit 38.0, platelets 322.   Albumin 3.5.  AST 20, ALT 11, alkaline phosphatase 70, total bilirubin 0.7.   ASSESSMENT/PLAN:  An 77 year old African-American woman admitted with  shortness of breath x2 days.   Problem 1. Pneumonia.  The patient is started on  Avalox 400 mg IV daily.  The patient is also on oxygen by nasal cannula.   Problem 2. Chronic obstructive pulmonary disease exacerbation.  Solu-Medrol  with a loading dose of 125 mg IV, then 60 mg IV q.6 has been started.  The  patient also is on albuterol and Atrovent nebs q.4h.  She will be given  guaifenesin for symptomatic relief of cough.  Blood cultures x2 have been  drawn.   Problem 3. Hypertension.  The patient states that she is on Clonidine,  however dose  unknown.  We will call Dr. Pincus Sanes office in the morning to  find out her correct dose of Clonidine.  She is on Atenolol 50 mg p.o. daily  which has been resumed.  We will obtain her Clonidine dose from Dr.  Lendell Caprice.   Problem 4. Gout.  Her allopurinol has been resumed at 300 mg p.o. daily.   Problem 5. Hyperlipidemia.  Lipitor has been resumed at 20 mg p.o. daily.  We will check a fasting lipid profile.   Problem 6. Prophylaxis.  The patient is on Protonix 40 mg p.o. daily for GI  prophylaxis.  SVDs for DVT  prophylaxis.   DISPOSITION:  After resolution of her acute symptoms, the patient will be  discharged home.      GDK/MEDQ  D:  01/22/2005  T:  01/22/2005  Job:  [id number]   cc:   Janae Bridgeman. Eloise Harman., M.D.  [address]  [place]  [place] [address]  Fax: [phone]

## 2011-05-27 LAB — BASIC METABOLIC PANEL
BUN: 23
Calcium: 10.3
Calcium: 9.4
Creatinine, Ser: 0.99
GFR calc Af Amer: >60
GFR calc non Af Amer: 39 — ABNORMAL LOW
GFR calc non Af Amer: 54 — ABNORMAL LOW
Glucose, Bld: 63 — ABNORMAL LOW
Sodium: 141

## 2011-05-27 LAB — DIFFERENTIAL
Basophils Absolute: 0
Basophils Relative: 1
Eosinophils Absolute: 0.3
Eosinophils Relative: 4
Lymphocytes Relative: 25
Lymphs Abs: 1.6
Monocytes Absolute: 0.5
Monocytes Relative: 8
Neutro Abs: 4.1
Neutrophils Relative %: 63

## 2011-05-27 LAB — I-STAT 8, (EC8 V) (CONVERTED LAB)
Acid-Base Excess: 2
BUN: 27 — ABNORMAL HIGH
Chloride: 104
HCT: 40
Potassium: 3.2 — ABNORMAL LOW
pCO2, Ven: 43.7 — ABNORMAL LOW
pH, Ven: 7.405 — ABNORMAL HIGH

## 2011-05-27 LAB — CBC
HCT: 36.3
Hemoglobin: 11.8 — ABNORMAL LOW
MCHC: 32.3
MCHC: 33
MCV: 88.4
Platelets: 351
Platelets: 448 — ABNORMAL HIGH
RBC: 3.57 — ABNORMAL LOW
RBC: 4.11
RDW: 15.5
RDW: 15.6 — ABNORMAL HIGH
WBC: 6
WBC: 6.5
WBC: 6.8

## 2011-05-27 LAB — WOUND CULTURE

## 2011-05-27 LAB — URIC ACID: Uric Acid, Serum: 6.2

## 2011-05-27 LAB — POCT I-STAT CREATININE
Creatinine, Ser: 1.4 — ABNORMAL HIGH
Operator id: 151321

## 2011-05-27 LAB — TSH: TSH: 2.356

## 2011-05-27 LAB — SEDIMENTATION RATE: Sed Rate: 40 — ABNORMAL HIGH

## 2011-11-14 ENCOUNTER — Other Ambulatory Visit: Payer: Self-pay | Admitting: Internal Medicine

## 2011-11-14 DIAGNOSIS — Z1231 Encounter for screening mammogram for malignant neoplasm of breast: Secondary | ICD-10-CM

## 2011-11-21 ENCOUNTER — Ambulatory Visit: Payer: Medicare HMO

## 2011-12-10 ENCOUNTER — Ambulatory Visit: Payer: Medicare HMO

## 2011-12-29 ENCOUNTER — Ambulatory Visit: Payer: Medicare HMO

## 2012-01-06 ENCOUNTER — Ambulatory Visit: Payer: Medicare HMO

## 2012-11-23 ENCOUNTER — Encounter (HOSPITAL_COMMUNITY): Payer: Self-pay | Admitting: Emergency Medicine

## 2012-11-23 ENCOUNTER — Emergency Department (HOSPITAL_COMMUNITY)
Admission: EM | Admit: 2012-11-23 | Discharge: 2012-11-23 | Disposition: A | Discharge status: Home / Self Care | Payer: Medicare HMO | Attending: Emergency Medicine | Admitting: Emergency Medicine

## 2012-11-23 DIAGNOSIS — M109 Gout, unspecified: Secondary | ICD-10-CM | POA: Insufficient documentation

## 2012-11-23 DIAGNOSIS — Y929 Unspecified place or not applicable: Secondary | ICD-10-CM | POA: Insufficient documentation

## 2012-11-23 DIAGNOSIS — Z79899 Other long term (current) drug therapy: Secondary | ICD-10-CM | POA: Insufficient documentation

## 2012-11-23 DIAGNOSIS — W06XXXA Fall from bed, initial encounter: Secondary | ICD-10-CM | POA: Insufficient documentation

## 2012-11-23 DIAGNOSIS — Y9389 Activity, other specified: Secondary | ICD-10-CM | POA: Insufficient documentation

## 2012-11-23 DIAGNOSIS — I10 Essential (primary) hypertension: Secondary | ICD-10-CM | POA: Insufficient documentation

## 2012-11-23 DIAGNOSIS — Z8739 Personal history of other diseases of the musculoskeletal system and connective tissue: Secondary | ICD-10-CM | POA: Insufficient documentation

## 2012-11-23 DIAGNOSIS — Z7982 Long term (current) use of aspirin: Secondary | ICD-10-CM | POA: Insufficient documentation

## 2012-11-23 DIAGNOSIS — S0101XA Laceration without foreign body of scalp, initial encounter: Secondary | ICD-10-CM

## 2012-11-23 DIAGNOSIS — S0100XA Unspecified open wound of scalp, initial encounter: Secondary | ICD-10-CM | POA: Insufficient documentation

## 2012-11-23 DIAGNOSIS — S0990XA Unspecified injury of head, initial encounter: Secondary | ICD-10-CM | POA: Insufficient documentation

## 2012-11-23 HISTORY — DX: Gout, unspecified: M10.9

## 2012-11-23 HISTORY — DX: Unspecified osteoarthritis, unspecified site: M19.90

## 2012-11-23 NOTE — ED Notes (Signed)
Pt reports a rollaway bed fell on her head. Neg LOC or blood thinners. Denies head/neck pain. Small lac approx 2cm noted to top of head. Pt alert, oriented x4, NAD.

## 2012-11-23 NOTE — ED Provider Notes (Signed)
History     CSN: [id number]  Arrival date & time 11/23/12  1405   First MD Initiated Contact with Patient 11/23/12 1503      Chief Complaint  Patient presents with  . Head Injury    The history is provided by the patient.  patient reports she was struck in the top of her head when a roll away bed fell and hit her in the head.  No loss consciousness.  She's not on any anticoagulants.  She has no headache at this time.  She reports a small laceration the top of her scalp that was bleeding but is no longer bleeding.  No neck pain.  No weakness of her upper lower extremities.  No other complaints.  Her symptoms are mild in severity.  Past Medical History  Diagnosis Date  . Hypertension   . Arthritis   . Gout     No past surgical history on file.  No family history on file.  History  Substance Use Topics  . Smoking status: Not on file  . Smokeless tobacco: Not on file  . Alcohol Use: Not on file    OB History   Grav Para Term Preterm Abortions TAB SAB Ect Mult Living                  Review of Systems  All other systems reviewed and are negative.    Allergies  Penicillins  Home Medications   Current Outpatient Rx  Name  Route  Sig  Dispense  Refill  . acetaminophen-codeine (TYLENOL #3) 300-30 MG per tablet   Oral   Take 1 tablet by mouth every 6 (six) hours as needed for pain.         Marland Kitchen allopurinol (ZYLOPRIM) 300 MG tablet   Oral   Take 150 mg by mouth 2 (two) times daily.         Marland Kitchen amLODipine (NORVASC) 5 MG tablet   Oral   Take 5 mg by mouth daily.         Marland Kitchen aspirin EC 81 MG tablet   Oral   Take 81 mg by mouth daily.         Marland Kitchen atenolol (TENORMIN) 25 MG tablet   Oral   Take 25 mg by mouth 2 (two) times daily.         . hydrochlorothiazide (MICROZIDE) 12.5 MG capsule   Oral   Take 12.5 mg by mouth daily.         Marland Kitchen losartan (COZAAR) 100 MG tablet   Oral   Take 100 mg by mouth daily.         Marland Kitchen oxyCODONE-acetaminophen  (PERCOCET/ROXICET) 5-325 MG per tablet   Oral   Take 1 tablet by mouth 2 (two) times daily.          . simvastatin (ZOCOR) 40 MG tablet   Oral   Take 40 mg by mouth every evening.           BP 152/80  Pulse 66  Temp(Src) 97.8 F (36.6 C) (Oral)  Resp 18  SpO2 92%  Physical Exam  Nursing note and vitals reviewed. Constitutional: She is oriented to person, place, and time. She appears well-developed and well-nourished. No distress.  HENT:  Head: Normocephalic and atraumatic.  Small 0.5 cm laceration to the top of her scalp.  No active bleeding.  Eyes: EOM are normal.  Neck: Normal range of motion. Neck supple.  No cervical tenderness  Cardiovascular: Normal rate,  regular rhythm and normal heart sounds.   Pulmonary/Chest: Effort normal and breath sounds normal.  Abdominal: Soft. She exhibits no distension. There is no tenderness.  Musculoskeletal: Normal range of motion.  Neurological: She is alert and oriented to person, place, and time.  Skin: Skin is warm and dry.  Psychiatric: She has a normal mood and affect. Judgment normal.    ED Course  Procedures (including critical care time)  LACERATION REPAIR Performed by: Lyanne Co Consent: Verbal consent obtained. Risks and benefits: risks, benefits and alternatives were discussed Patient identity confirmed: provided demographic data Time out performed prior to procedure Prepped and Draped in normal sterile fashion Wound explored Laceration Location:  scalp Laceration Length: 0.5 cm No Foreign Bodies seen or palpated Anesthesia: None  Irrigation method: syringe Amount of cleaning: standard Skin closure: Staple  Number of sutures or staples: 1  Technique: Staple  Patient tolerance: Patient tolerated the procedure well with no immediate complications.   Labs Reviewed - No data to display No results found.   1. Minor head injury, initial encounter   2. Scalp laceration, initial encounter       MDM   No indication for CT head.  Laceration repaired with one single staple.  Discharge home in good condition.  Head injury warnings given.  No neck tenderness or pain.  No other complaints.        Lyanne Co, MD 11/23/12 [phone]

## 2012-11-23 NOTE — ED Notes (Signed)
Reports was hit in head by a roll away bed. Denies LOC. Small lac to top of head, bleeding presently controlled. Denies pain, dizziness, blurred vision, n/v/, gait steady.

## 2012-12-02 ENCOUNTER — Emergency Department (HOSPITAL_COMMUNITY): Payer: Medicare HMO

## 2012-12-02 ENCOUNTER — Encounter (HOSPITAL_COMMUNITY): Payer: Self-pay

## 2012-12-02 ENCOUNTER — Inpatient Hospital Stay (HOSPITAL_COMMUNITY)
Admission: EM | Admit: 2012-12-02 | Discharge: 2012-12-06 | DRG: 871 | Disposition: A | Discharge status: Home Health | Payer: Medicare HMO | Attending: Internal Medicine | Admitting: Internal Medicine

## 2012-12-02 DIAGNOSIS — Z880 Allergy status to penicillin: Secondary | ICD-10-CM

## 2012-12-02 DIAGNOSIS — A413 Sepsis due to Hemophilus influenzae: Principal | ICD-10-CM

## 2012-12-02 DIAGNOSIS — Z87891 Personal history of nicotine dependence: Secondary | ICD-10-CM

## 2012-12-02 DIAGNOSIS — R0602 Shortness of breath: Secondary | ICD-10-CM

## 2012-12-02 DIAGNOSIS — M109 Gout, unspecified: Secondary | ICD-10-CM

## 2012-12-02 DIAGNOSIS — Z79899 Other long term (current) drug therapy: Secondary | ICD-10-CM

## 2012-12-02 DIAGNOSIS — I10 Essential (primary) hypertension: Secondary | ICD-10-CM

## 2012-12-02 DIAGNOSIS — E876 Hypokalemia: Secondary | ICD-10-CM | POA: Diagnosis present

## 2012-12-02 DIAGNOSIS — J189 Pneumonia, unspecified organism: Secondary | ICD-10-CM

## 2012-12-02 DIAGNOSIS — D72829 Elevated white blood cell count, unspecified: Secondary | ICD-10-CM | POA: Diagnosis present

## 2012-12-02 DIAGNOSIS — Z7982 Long term (current) use of aspirin: Secondary | ICD-10-CM

## 2012-12-02 DIAGNOSIS — D72819 Decreased white blood cell count, unspecified: Secondary | ICD-10-CM

## 2012-12-02 DIAGNOSIS — A419 Sepsis, unspecified organism: Secondary | ICD-10-CM | POA: Diagnosis present

## 2012-12-02 LAB — POCT I-STAT, CHEM 8
Chloride: 98 mEq/L (ref 96–112)
HCT: 35 % — ABNORMAL LOW (ref 36.0–46.0)
Potassium: 3.4 mEq/L — ABNORMAL LOW (ref 3.5–5.1)

## 2012-12-02 LAB — URINALYSIS, ROUTINE W REFLEX MICROSCOPIC
Glucose, UA: NEGATIVE mg/dL
Nitrite: NEGATIVE
Specific Gravity, Urine: 1.018 (ref 1.005–1.030)
pH: 5.5 (ref 5.0–8.0)

## 2012-12-02 LAB — CBC WITH DIFFERENTIAL/PLATELET
Basophils Relative: 0 % (ref 0–1)
Eosinophils Absolute: 0 10*3/uL (ref 0.0–0.7)
Eosinophils Relative: 0 % (ref 0–5)
Lymphs Abs: 1.1 10*3/uL (ref 0.7–4.0)
MCH: 29.5 pg (ref 26.0–34.0)
MCHC: 33.9 g/dL (ref 30.0–36.0)
MCV: 87 fL (ref 78.0–100.0)
Neutrophils Relative %: 85 % — ABNORMAL HIGH (ref 43–77)
Platelets: 236 10*3/uL (ref 150–400)
RBC: 3.86 MIL/uL — ABNORMAL LOW (ref 3.87–5.11)
RDW: 14.4 % (ref 11.5–15.5)

## 2012-12-02 LAB — POCT I-STAT 3, ART BLOOD GAS (G3+)
Acid-Base Excess: 4 mmol/L — ABNORMAL HIGH (ref 0.0–2.0)
Bicarbonate: 27.5 mEq/L — ABNORMAL HIGH (ref 20.0–24.0)
Patient temperature: 98.6
TCO2: 29 mmol/L (ref 0–100)
pH, Arterial: 7.499 — ABNORMAL HIGH (ref 7.350–7.450)

## 2012-12-02 LAB — CBC
HCT: 31.5 % — ABNORMAL LOW (ref 36.0–46.0)
MCHC: 33.7 g/dL (ref 30.0–36.0)
MCV: 87 fL (ref 78.0–100.0)
Platelets: 231 10*3/uL (ref 150–400)
RDW: 14.2 % (ref 11.5–15.5)
WBC: 20.6 10*3/uL — ABNORMAL HIGH (ref 4.0–10.5)

## 2012-12-02 LAB — COMPREHENSIVE METABOLIC PANEL
ALT: 11 U/L (ref 0–35)
Albumin: 3 g/dL — ABNORMAL LOW (ref 3.5–5.2)
Calcium: 9.6 mg/dL (ref 8.4–10.5)
GFR calc Af Amer: 64 mL/min — ABNORMAL LOW (ref >90)
Glucose, Bld: 109 mg/dL — ABNORMAL HIGH (ref 70–99)
Sodium: 136 mEq/L (ref 135–145)
Total Protein: 6.6 g/dL (ref 6.0–8.3)

## 2012-12-02 LAB — URINE MICROSCOPIC-ADD ON

## 2012-12-02 LAB — MRSA PCR SCREENING: MRSA by PCR: NEGATIVE

## 2012-12-02 LAB — POCT I-STAT TROPONIN I

## 2012-12-02 LAB — CG4 I-STAT (LACTIC ACID): Lactic Acid, Venous: 1.65 mmol/L (ref 0.5–2.2)

## 2012-12-02 MED ORDER — DEXTROSE 5 % IV SOLN
500.0000 mg | INTRAVENOUS | Status: DC
  Administered 2012-12-02 – 2012-12-03 (×2): 500 mg via INTRAVENOUS
  Filled 2012-12-02 (×3): qty 500

## 2012-12-02 MED ORDER — ATORVASTATIN CALCIUM 20 MG PO TABS
20.0000 mg | ORAL_TABLET | Freq: Every day | ORAL | Status: DC
  Administered 2012-12-03 – 2012-12-05 (×3): 20 mg via ORAL
  Filled 2012-12-02 (×4): qty 1

## 2012-12-02 MED ORDER — POTASSIUM CHLORIDE CRYS ER 20 MEQ PO TBCR
60.0000 meq | EXTENDED_RELEASE_TABLET | Freq: Once | ORAL | Status: AC
  Administered 2012-12-02: 60 meq via ORAL
  Filled 2012-12-02: qty 3

## 2012-12-02 MED ORDER — OXYCODONE-ACETAMINOPHEN 5-325 MG PO TABS
1.0000 | ORAL_TABLET | Freq: Three times a day (TID) | ORAL | Status: DC | PRN
  Administered 2012-12-05: 1 via ORAL
  Filled 2012-12-02 (×2): qty 1

## 2012-12-02 MED ORDER — GUAIFENESIN ER 600 MG PO TB12
600.0000 mg | ORAL_TABLET | Freq: Two times a day (BID) | ORAL | Status: DC
  Administered 2012-12-02 – 2012-12-06 (×8): 600 mg via ORAL
  Filled 2012-12-02 (×9): qty 1

## 2012-12-02 MED ORDER — SODIUM CHLORIDE 0.9 % IV SOLN
Freq: Once | INTRAVENOUS | Status: AC
  Administered 2012-12-02: 16:00:00 via INTRAVENOUS

## 2012-12-02 MED ORDER — ALBUTEROL SULFATE (5 MG/ML) 0.5% IN NEBU
2.5000 mg | INHALATION_SOLUTION | RESPIRATORY_TRACT | Status: DC | PRN
  Administered 2012-12-03: 2.5 mg via RESPIRATORY_TRACT
  Filled 2012-12-02: qty 0.5

## 2012-12-02 MED ORDER — ALLOPURINOL 150 MG HALF TABLET
150.0000 mg | ORAL_TABLET | Freq: Two times a day (BID) | ORAL | Status: DC
  Administered 2012-12-02 – 2012-12-06 (×8): 150 mg via ORAL
  Filled 2012-12-02 (×9): qty 1

## 2012-12-02 MED ORDER — ATENOLOL 25 MG PO TABS
25.0000 mg | ORAL_TABLET | Freq: Two times a day (BID) | ORAL | Status: DC
  Administered 2012-12-02 – 2012-12-06 (×8): 25 mg via ORAL
  Filled 2012-12-02 (×9): qty 1

## 2012-12-02 MED ORDER — DEXTROSE 5 % IV SOLN
1.0000 g | INTRAVENOUS | Status: DC
  Administered 2012-12-02 – 2012-12-05 (×4): 1 g via INTRAVENOUS
  Filled 2012-12-02 (×5): qty 10

## 2012-12-02 MED ORDER — ASPIRIN EC 81 MG PO TBEC
81.0000 mg | DELAYED_RELEASE_TABLET | Freq: Every day | ORAL | Status: DC
  Administered 2012-12-02 – 2012-12-06 (×5): 81 mg via ORAL
  Filled 2012-12-02 (×5): qty 1

## 2012-12-02 MED ORDER — IPRATROPIUM BROMIDE 0.02 % IN SOLN
0.5000 mg | Freq: Four times a day (QID) | RESPIRATORY_TRACT | Status: DC
  Administered 2012-12-02 – 2012-12-03 (×3): 0.5 mg via RESPIRATORY_TRACT
  Filled 2012-12-02 (×3): qty 2.5

## 2012-12-02 MED ORDER — SODIUM CHLORIDE 0.9 % IV SOLN
INTRAVENOUS | Status: DC
Start: 2012-12-02 — End: 2012-12-04
  Administered 2012-12-02 – 2012-12-04 (×4): via INTRAVENOUS

## 2012-12-02 MED ORDER — SIMVASTATIN 40 MG PO TABS
40.0000 mg | ORAL_TABLET | Freq: Every evening | ORAL | Status: DC
  Administered 2012-12-02: 40 mg via ORAL
  Filled 2012-12-02: qty 1

## 2012-12-02 MED ORDER — HYDROCHLOROTHIAZIDE 12.5 MG PO CAPS
12.5000 mg | ORAL_CAPSULE | Freq: Every day | ORAL | Status: DC
Start: 2012-12-03 — End: 2012-12-06
  Administered 2012-12-03 – 2012-12-06 (×4): 12.5 mg via ORAL
  Filled 2012-12-02 (×4): qty 1

## 2012-12-02 MED ORDER — AMLODIPINE BESYLATE 5 MG PO TABS
5.0000 mg | ORAL_TABLET | Freq: Every day | ORAL | Status: DC
  Administered 2012-12-03 – 2012-12-06 (×4): 5 mg via ORAL
  Filled 2012-12-02 (×4): qty 1

## 2012-12-02 MED ORDER — HEPARIN SODIUM (PORCINE) 5000 UNIT/ML IJ SOLN
5000.0000 [IU] | Freq: Three times a day (TID) | INTRAMUSCULAR | Status: DC
  Administered 2012-12-02 – 2012-12-06 (×12): 5000 [IU] via SUBCUTANEOUS
  Filled 2012-12-02 (×15): qty 1

## 2012-12-02 NOTE — H&P (Signed)
[facility] History and Physical  [name]:[id number] DOB: [birth year] DOA: 12/02/2012  Referring physician: Carleene Cooper III, MD PCP: Pearson Grippe, MD   Chief Complaint: Pneumonia  HPI: [name] is a 79 y.o. female with basilar dose of hypertension and gouty arthritis came to the hospital because of shortness of breath. Patient said she was in her usual she felt to last night she started to have shortness of breath associated with minimally productive cough. Patient also has pleurisy with some left-sided chest pain on inspiration and cough. She denies any wheezing, denies any sputum production. Upon initial evaluation in the emergency department chest x-ray showed lingular pneumonia and her CBC showed WBCs of 19.1.  Review of Systems: Constitutional: negative for anorexia, fevers and sweats Eyes: negative for irritation, redness and visual disturbance Ears, nose, mouth, throat, and face: negative for earaches, epistaxis, nasal congestion and sore throat Respiratory: Per history of present illness  Cardiovascular: negative for chest pain, dyspnea, lower extremity edema, orthopnea, palpitations and syncope Gastrointestinal: negative for abdominal pain, constipation, diarrhea, melena, nausea and vomiting Genitourinary:negative for dysuria, frequency and hematuria Hematologic/lymphatic: negative for bleeding, easy bruising and lymphadenopathy Musculoskeletal:negative for arthralgias, muscle weakness and stiff joints Neurological: negative for coordination problems, gait problems, headaches and weakness Endocrine: negative for diabetic symptoms including polydipsia, polyuria and weight loss Allergic/Immunologic: negative for anaphylaxis, hay fever and urticaria   Past Medical History  Diagnosis Date  . Hypertension   . Arthritis   . Gout    Past Surgical History  Procedure Laterality Date  . Appendectomy     Social History:  reports that she has quit smoking. She  does not have any smokeless tobacco history on file. She reports that she does not drink alcohol or use illicit drugs. Lives alone at home, she was ambulatory prior to get sick  Allergies  Allergen Reactions  . Penicillins Rash    Family History  Problem Relation Age of Onset  . Cancer Mother   . Stroke Father     Prior to Admission medications   Medication Sig Start Date End Date Taking? Authorizing Provider  acetaminophen-codeine (TYLENOL #3) 300-30 MG per tablet Take 1 tablet by mouth every 6 (six) hours as needed for pain.   Yes Historical Provider, MD  allopurinol (ZYLOPRIM) 300 MG tablet Take 150 mg by mouth 2 (two) times daily.   Yes Historical Provider, MD  amLODipine (NORVASC) 5 MG tablet Take 5 mg by mouth daily.   Yes Historical Provider, MD  aspirin EC 81 MG tablet Take 81 mg by mouth daily.   Yes Historical Provider, MD  Aspirin-Acetaminophen-Caffeine (GOODY HEADACHE PO) Take 1 packet by mouth 2 (two) times daily as needed.   Yes Historical Provider, MD  atenolol (TENORMIN) 25 MG tablet Take 25 mg by mouth 2 (two) times daily.   Yes Historical Provider, MD  hydrochlorothiazide (MICROZIDE) 12.5 MG capsule Take 12.5 mg by mouth daily.   Yes Historical Provider, MD  losartan (COZAAR) 100 MG tablet Take 100 mg by mouth daily.   Yes Historical Provider, MD  oxyCODONE-acetaminophen (PERCOCET/ROXICET) 5-325 MG per tablet Take 1 tablet by mouth 2 (two) times daily.    Yes Historical Provider, MD  simvastatin (ZOCOR) 40 MG tablet Take 40 mg by mouth every evening.   Yes Historical Provider, MD   Physical Exam: Filed Vitals:   12/02/12 1530 12/02/12 1600 12/02/12 1630 12/02/12 1735  BP: 124/67 116/63 115/61 117/61  Pulse: 92 88 89 92  Temp: 101  F (38.3 C)     TempSrc: Oral     Resp: 28 32 34 18  SpO2: 95% 94% 94% 97%   General appearance: alert, cooperative and no distress  Head: Normocephalic, without obvious abnormality, atraumatic  Eyes: conjunctivae/corneas clear.  PERRL, EOM's intact. Fundi benign.  Nose: Nares normal. Septum midline. Mucosa normal. No drainage or sinus tenderness.  Throat: lips, mucosa, and tongue normal; teeth and gums normal  Neck: Supple, no masses, no cervical lymphadenopathy, no JVD appreciated, no meningeal signs Resp: Scattered rhonchi bilateral special of the left lung field  Chest wall: no tenderness  Cardio: regular rate and rhythm, S1, S2 normal, no murmur, click, rub or gallop  GI: soft, non-tender; bowel sounds normal; no masses, no organomegaly  Extremities: extremities normal, atraumatic, no cyanosis or edema  Skin: Skin color, texture, turgor normal. No rashes or lesions  Neurologic: Alert and oriented X 3, normal strength and tone. Normal symmetric reflexes. Normal coordination and gait   Labs on Admission:  Basic Metabolic Panel:  Recent Labs Lab 12/02/12 1540 12/02/12 1619  NA 136 137  K 3.4* 3.4*  CL 96 98  CO2 30  --   GLUCOSE 109* 110*  BUN 15 14  CREATININE 0.91 1.00  CALCIUM 9.6  --    Liver Function Tests:  Recent Labs Lab 12/02/12 1540  AST 17  ALT 11  ALKPHOS 71  BILITOT 0.6  PROT 6.6  ALBUMIN 3.0*   No results found for this basename: LIPASE, AMYLASE,  in the last 168 hours No results found for this basename: AMMONIA,  in the last 168 hours CBC:  Recent Labs Lab 12/02/12 1540 12/02/12 1619  WBC 19.1*  --   NEUTROABS 16.2*  --   HGB 11.4* 11.9*  HCT 33.6* 35.0*  MCV 87.0  --   PLT 236  --    Cardiac Enzymes: No results found for this basename: CKTOTAL, CKMB, CKMBINDEX, TROPONINI,  in the last 168 hours  BNP (last 3 results) No results found for this basename: PROBNP,  in the last 8760 hours CBG: No results found for this basename: GLUCAP,  in the last 168 hours  Radiological Exams on Admission: Dg Chest 2 View  12/02/2012  *RADIOLOGY REPORT*  Clinical Data: Fever, cough, congestion, history of pneumonia  CHEST - 2 VIEW  Comparison: CT chest of 10/11/2010, chest x-ray  of the same date, and chest x-ray of 04/16/2008  Findings:  There is now a new opacity within the left mid lung which appears to involve the left upper lobe/lingula and is most consistent with pneumonia.  However this opacity is somewhat mass- like and continued follow-up is recommended to ensure clearing. There is a small left pleural effusion blunting the posterior costophrenic angle.  The right lung is now clear.  Mediastinal contours are stable.  No bony abnormality is seen.  Mild cardiomegaly is stable  IMPRESSION:  1.  New opacity in the anterior left lobe - lingula most consistent with pneumonia in view of the rapid appearance.  Recommend follow- up to ensure complete clearing. 2.  Tiny left pleural effusion. 3.  Stable mild cardiomegaly.   Original Report Authenticated By: Dwyane Dee, M.D.     EKG: Independently reviewed.   Assessment/Plan Principal Problem:   CAP (community acquired pneumonia) Active Problems:   SOB (shortness of breath)   HTN (hypertension)   Pneumonia -Community acquired pneumonia, affecting the lingula. -Patient started on Rocephin and azithromycin in the emergency department with continued. -Supportive  management with inhaled bronchodilators, mucolytics and oxygen as needed. -Check urine for strep antigen, Legionella and screen for HIV.  Hypertension -Restart home medications, patient blood pressure is reasonably controlled.  Hypokalemia -Mild hypokalemia with potassium 3.4. -Replace with oral supplements. Check BMP in a.m.  Code Status: Full code Family Communication: No family available at the time of this interview Disposition Plan: Inpatient, telemetry, anticipate length of stay of more than 2 midnights.  Time spent: 70 minutes  [facility] A [facility] Pager [phone]  If 7PM-7AM, please contact night-coverage [email or web address] Password [facility] 12/02/2012, 5:53 PM

## 2012-12-02 NOTE — ED Notes (Signed)
MD at bedside.Dr Davidson 

## 2012-12-02 NOTE — ED Provider Notes (Addendum)
History     CSN: [id number]  Arrival date & time 12/02/12  1418   First MD Initiated Contact with Patient 12/02/12 1502      Chief Complaint  Patient presents with  . Fever  . Cough    (Consider location/radiation/quality/duration/timing/severity/associated sxs/prior treatment) HPI Comments: Patient is an 79 year old woman who had a cough and left pleuritic chest pain all last night. She was too weak to get up, and urinated on herself this morning. Therefore, her sister called EMS and she was brought to [facility] for evaluation. Review of her old charts shows she was admitted for pneumonia in February of 2012.  Patient is a 79 y.o. female presenting with fever and cough. The history is provided by the patient and medical records. No language interpreter was used.  Fever Max temp prior to arrival:  EMS that her temp went to 102. Temperature source: Unknown. Severity:  Moderate Onset quality:  Sudden Duration:  12 hours Timing:  Constant Progression:  Unchanged Chronicity:  New Relieved by:  Nothing Worsened by:  Nothing tried Ineffective treatments:  None tried Associated symptoms: chest pain and cough   Associated symptoms comment:  Weakness, urinary incontinence. Chest pain:    Chest pain quality: Left anterolateral chest pain, worse when she takes a deep breath or coughs.   Severity:  Moderate Cough Associated symptoms: chest pain and fever     Past Medical History  Diagnosis Date  . Hypertension   . Arthritis   . Gout     Past Surgical History  Procedure Laterality Date  . Appendectomy      No family history on file.  History  Substance Use Topics  . Smoking status: Former Games developer  . Smokeless tobacco: Not on file  . Alcohol Use: No    OB History   Grav Para Term Preterm Abortions TAB SAB Ect Mult Living                  Review of Systems  Constitutional: Positive for fever.  Respiratory: Positive for cough.   Cardiovascular: Positive for  chest pain.    Allergies  Penicillins  Home Medications   Current Outpatient Rx  Name  Route  Sig  Dispense  Refill  . acetaminophen-codeine (TYLENOL #3) 300-30 MG per tablet   Oral   Take 1 tablet by mouth every 6 (six) hours as needed for pain.         Marland Kitchen allopurinol (ZYLOPRIM) 300 MG tablet   Oral   Take 150 mg by mouth 2 (two) times daily.         Marland Kitchen amLODipine (NORVASC) 5 MG tablet   Oral   Take 5 mg by mouth daily.         Marland Kitchen aspirin EC 81 MG tablet   Oral   Take 81 mg by mouth daily.         . Aspirin-Acetaminophen-Caffeine (GOODY HEADACHE PO)   Oral   Take 1 packet by mouth 2 (two) times daily as needed.         Marland Kitchen atenolol (TENORMIN) 25 MG tablet   Oral   Take 25 mg by mouth 2 (two) times daily.         . hydrochlorothiazide (MICROZIDE) 12.5 MG capsule   Oral   Take 12.5 mg by mouth daily.         Marland Kitchen losartan (COZAAR) 100 MG tablet   Oral   Take 100 mg by mouth daily.         Marland Kitchen  oxyCODONE-acetaminophen (PERCOCET/ROXICET) 5-325 MG per tablet   Oral   Take 1 tablet by mouth 2 (two) times daily.          . simvastatin (ZOCOR) 40 MG tablet   Oral   Take 40 mg by mouth every evening.           BP 125/69  Pulse 97  Temp(Src) 98.8 F (37.1 C) (Oral)  Resp 26  SpO2 94%  Physical Exam  Nursing note and vitals reviewed. Constitutional: She is oriented to person, place, and time.  Elderly woman, frail appearance, tachypneic.  HENT:  Head: Normocephalic and atraumatic.  Right Ear: External ear normal.  Left Ear: External ear normal.  Mouth/Throat: Oropharynx is clear and moist.  Eyes: Conjunctivae and EOM are normal. Pupils are equal, round, and reactive to light. No scleral icterus.  Neck: Normal range of motion. No JVD present.  Cardiovascular: Normal rate, regular rhythm and normal heart sounds.   Pulmonary/Chest:  Rhonchi over L base.  Abdominal: Soft. Bowel sounds are normal. There is no tenderness.  Musculoskeletal: Normal  range of motion. She exhibits no edema and no tenderness.  Lymphadenopathy:    She has no cervical adenopathy.  Neurological: She is alert and oriented to person, place, and time.  No sensory or motor deficit.  Skin: Skin is warm and dry.  Psychiatric: She has a normal mood and affect. Her behavior is normal.    ED Course  Procedures (including critical care time)  Labs Reviewed  CBC WITH DIFFERENTIAL  COMPREHENSIVE METABOLIC PANEL  URINALYSIS, ROUTINE W REFLEX MICROSCOPIC   3:24 PM  Date: 12/02/2012  Rate: 101  Rhythm: sinus tachycardia  QRS Axis: normal  Intervals: normal QRS:  Left ventricular hypertrophy  ST/T Wave abnormalities: Inverted T waves in lateral precordial leads.  Conduction Disutrbances:none  Narrative Interpretation: Abnormal EKG  Old EKG Reviewed: unchanged    Dg Chest 2 View  12/02/2012  *RADIOLOGY REPORT*  Clinical Data: Fever, cough, congestion, history of pneumonia  CHEST - 2 VIEW  Comparison: CT chest of 10/11/2010, chest x-ray of the same date, and chest x-ray of 04/16/2008  Findings:  There is now a new opacity within the left mid lung which appears to involve the left upper lobe/lingula and is most consistent with pneumonia.  However this opacity is somewhat mass- like and continued follow-up is recommended to ensure clearing. There is a small left pleural effusion blunting the posterior costophrenic angle.  The right lung is now clear.  Mediastinal contours are stable.  No bony abnormality is seen.  Mild cardiomegaly is stable  IMPRESSION:  1.  New opacity in the anterior left lobe - lingula most consistent with pneumonia in view of the rapid appearance.  Recommend follow- up to ensure complete clearing. 2.  Tiny left pleural effusion. 3.  Stable mild cardiomegaly.   Original Report Authenticated By: Dwyane Dee, M.D.    Results for orders placed during the hospital encounter of 12/02/12  CBC WITH DIFFERENTIAL      Result Value Range   WBC 19.1 (*) 4.0  - 10.5 K/uL   RBC 3.86 (*) 3.87 - 5.11 MIL/uL   Hemoglobin 11.4 (*) 12.0 - 15.0 g/dL   HCT 96.0 (*) 45.4 - 09.8 %   MCV 87.0  78.0 - 100.0 fL   MCH 29.5  26.0 - 34.0 pg   MCHC 33.9  30.0 - 36.0 g/dL   RDW 11.9  14.7 - 82.9 %   Platelets 236  150 -  400 K/uL   Neutrophils Relative 85 (*) 43 - 77 %   Neutro Abs 16.2 (*) 1.7 - 7.7 K/uL   Lymphocytes Relative 6 (*) 12 - 46 %   Lymphs Abs 1.1  0.7 - 4.0 K/uL   Monocytes Relative 9  3 - 12 %   Monocytes Absolute 1.7 (*) 0.1 - 1.0 K/uL   Eosinophils Relative 0  0 - 5 %   Eosinophils Absolute 0.0  0.0 - 0.7 K/uL   Basophils Relative 0  0 - 1 %   Basophils Absolute 0.0  0.0 - 0.1 K/uL  COMPREHENSIVE METABOLIC PANEL      Result Value Range   Sodium 136  135 - 145 mEq/L   Potassium 3.4 (*) 3.5 - 5.1 mEq/L   Chloride 96  96 - 112 mEq/L   CO2 30  19 - 32 mEq/L   Glucose, Bld 109 (*) 70 - 99 mg/dL   BUN 15  6 - 23 mg/dL   Creatinine, Ser 1.19  0.50 - 1.10 mg/dL   Calcium 9.6  8.4 - 14.7 mg/dL   Total Protein 6.6  6.0 - 8.3 g/dL   Albumin 3.0 (*) 3.5 - 5.2 g/dL   AST 17  0 - 37 U/L   ALT 11  0 - 35 U/L   Alkaline Phosphatase 71  39 - 117 U/L   Total Bilirubin 0.6  0.3 - 1.2 mg/dL   GFR calc non Af Amer 55 (*) >90 mL/min   GFR calc Af Amer 64 (*) >90 mL/min  POCT I-STAT, CHEM 8      Result Value Range   Sodium 137  135 - 145 mEq/L   Potassium 3.4 (*) 3.5 - 5.1 mEq/L   Chloride 98  96 - 112 mEq/L   BUN 14  6 - 23 mg/dL   Creatinine, Ser 8.29  0.50 - 1.10 mg/dL   Glucose, Bld 562 (*) 70 - 99 mg/dL   Calcium, Ion 1.30  8.65 - 1.30 mmol/L   TCO2 29  0 - 100 mmol/L   Hemoglobin 11.9 (*) 12.0 - 15.0 g/dL   HCT 78.4 (*) 69.6 - 29.5 %  CG4 I-STAT (LACTIC ACID)      Result Value Range   Lactic Acid, Venous 1.65  0.5 - 2.2 mmol/L  POCT I-STAT TROPONIN I      Result Value Range   Troponin i, poc 0.00  0.00 - 0.08 ng/mL   Comment 3            5:13 PM Lab workup shows pneumonia in left midlung field.  Case discussed with Dr. Arthor Captain of  [facility], who will admit pt to [facility] to a telemetry bed. Rx with ceftriaxone and azithromycin.   IMP:  Community acquired pneumonia.      Carleene Cooper III, MD 12/02/12 1718      Carleene Cooper III, MD 12/14/12 1011

## 2012-12-02 NOTE — Progress Notes (Signed)
PHARMACIST - PHYSICIAN COMMUNICATION  CONCERNING:  Statin  DESCRIPTION & RECOMMENDATION: Simvastatin 40mg  daily ordered concomitantly with amlodipine.   FDA recommends limiting doses to max simvastatin 20mg  daily when used with amlodipine due to increased risk of rhabdomyolysis. Statin order has been changed to atorvastatin to avoid this interaction.   Benjaman Pott, PharmD, BCPS 12/02/2012   10:40 PM

## 2012-12-02 NOTE — ED Notes (Signed)
Coming from home. Productive cough for 2 weeks. Fever, chills, pain underneath left breast with inspiration.

## 2012-12-03 DIAGNOSIS — M109 Gout, unspecified: Secondary | ICD-10-CM

## 2012-12-03 DIAGNOSIS — D72819 Decreased white blood cell count, unspecified: Secondary | ICD-10-CM | POA: Diagnosis present

## 2012-12-03 LAB — HIV ANTIBODY (ROUTINE TESTING W REFLEX): HIV: NONREACTIVE

## 2012-12-03 LAB — CBC
HCT: 30.1 % — ABNORMAL LOW (ref 36.0–46.0)
Hemoglobin: 10.4 g/dL — ABNORMAL LOW (ref 12.0–15.0)
RBC: 3.45 MIL/uL — ABNORMAL LOW (ref 3.87–5.11)
WBC: 16.1 10*3/uL — ABNORMAL HIGH (ref 4.0–10.5)

## 2012-12-03 LAB — BASIC METABOLIC PANEL
BUN: 16 mg/dL (ref 6–23)
Chloride: 102 mEq/L (ref 96–112)
GFR calc Af Amer: 65 mL/min — ABNORMAL LOW (ref >90)
GFR calc non Af Amer: 56 mL/min — ABNORMAL LOW (ref >90)
Potassium: 4.2 mEq/L (ref 3.5–5.1)
Sodium: 135 mEq/L (ref 135–145)

## 2012-12-03 MED ORDER — IPRATROPIUM BROMIDE 0.02 % IN SOLN
0.5000 mg | Freq: Two times a day (BID) | RESPIRATORY_TRACT | Status: DC
  Administered 2012-12-03 – 2012-12-06 (×6): 0.5 mg via RESPIRATORY_TRACT
  Filled 2012-12-03 (×6): qty 2.5

## 2012-12-03 MED ORDER — LORAZEPAM 0.5 MG PO TABS: 0.5000 mg | ORAL_TABLET | Freq: Once | ORAL | Status: DC

## 2012-12-03 MED ORDER — ALBUTEROL SULFATE (5 MG/ML) 0.5% IN NEBU
2.5000 mg | INHALATION_SOLUTION | Freq: Two times a day (BID) | RESPIRATORY_TRACT | Status: DC
  Administered 2012-12-03 – 2012-12-06 (×6): 2.5 mg via RESPIRATORY_TRACT
  Filled 2012-12-03 (×6): qty 0.5

## 2012-12-03 NOTE — Progress Notes (Signed)
Patient states she was having some difficulty breathing. RN went to check on patient and patient was making herself hyperventilate. Patient was given a breathing treatment via mask and provider on call was contacted about hyperventilation. A dose of ativan was ordered for the patient. When primary RN went to administer medicine patient had already fallen asleep. Will continue to monitor.   Marcelyn Bruins RN

## 2012-12-03 NOTE — Progress Notes (Addendum)
[facility] PROGRESS NOTE  [name] JWJ:[id number] DOB: October 23, 1922 DOA: 12/02/2012 PCP: Pearson Grippe, MD  HPI/Subjective: Feels much better, denies shortness of breath or pleurisy  Assessment/Plan:  Pneumonia  -Community acquired pneumonia, affecting the lingula.  -Patient started on Rocephin and azithromycin in the emergency department with continued.  -Supportive management with inhaled bronchodilators, mucolytics and oxygen as needed.  -Negative urine strep antigen and HIV screening.  Leukocytosis -This is likely secondary to the pneumonia. -Wbc's count decreased from 20.6 to 16.1 today.  Hypertension  -Restart home medications, patient blood pressure is reasonably controlled.   Hypokalemia  -Mild hypokalemia with potassium 3.4.  -Replace with oral supplements. Check BMP in a.m.   Code Status: Full code Family Communication:  Disposition Plan: Remains inpatient, telemetry   Consultants:  None  Procedures:  None  Antibiotics:  On Rocephin and azithromycin for community-acquired pneumonia  Objective: Filed Vitals:   12/02/12 1830 12/02/12 1932 12/02/12 2013 12/03/12 0556  BP: 115/62  119/58 116/66  Pulse: 85  84 80  Temp: 99.6 F (37.6 C)  98.3 F (36.8 C) 99 F (37.2 C)  TempSrc: Oral  Oral Oral  Resp: 27  24 20   Height:  5\' 5"  (1.651 m)    Weight:   48.6 kg (107 lb 2.3 oz)   SpO2: 98%  98% 92%    Intake/Output Summary (Last 24 hours) at 12/03/12 1001 Last data filed at 12/03/12 0807  Gross per 24 hour  Intake   1050 ml  Output    550 ml  Net    500 ml   Filed Weights   12/02/12 2013  Weight: 48.6 kg (107 lb 2.3 oz)    Exam:  General: Alert and awake, oriented x3, not in any acute distress. HEENT: anicteric sclera, pupils reactive to light and accommodation, EOMI CVS: S1-S2 clear, no murmur rubs or gallops Chest: clear to auscultation bilaterally, no wheezing, rales or rhonchi Abdomen: soft nontender, nondistended, normal  bowel sounds, no organomegaly Extremities: no cyanosis, clubbing or edema noted bilaterally Neuro: Cranial nerves II-XII intact, no focal neurological deficits  Data Reviewed: Basic Metabolic Panel:  Recent Labs Lab 12/02/12 1540 12/02/12 1619 12/03/12 0605  NA 136 137 135  K 3.4* 3.4* 4.2  CL 96 98 102  CO2 30  --  26  GLUCOSE 109* 110* 97  BUN 15 14 16   CREATININE 0.91 1.00 0.90  CALCIUM 9.6  --  8.7   Liver Function Tests:  Recent Labs Lab 12/02/12 1540  AST 17  ALT 11  ALKPHOS 71  BILITOT 0.6  PROT 6.6  ALBUMIN 3.0*   No results found for this basename: LIPASE, AMYLASE,  in the last 168 hours No results found for this basename: AMMONIA,  in the last 168 hours CBC:  Recent Labs Lab 12/02/12 1540 12/02/12 1619 12/02/12 2003 12/03/12 0605  WBC 19.1*  --  20.6* 16.1*  NEUTROABS 16.2*  --   --   --   HGB 11.4* 11.9* 10.6* 10.4*  HCT 33.6* 35.0* 31.5* 30.1*  MCV 87.0  --  87.0 87.2  PLT 236  --  231 210   Cardiac Enzymes: No results found for this basename: CKTOTAL, CKMB, CKMBINDEX, TROPONINI,  in the last 168 hours BNP (last 3 results) No results found for this basename: PROBNP,  in the last 8760 hours CBG: No results found for this basename: GLUCAP,  in the last 168 hours  Recent Results (from the past 240 hour(s))  MRSA PCR  SCREENING     Status: None   Collection Time    12/02/12  7:50 PM      Result Value Range Status   MRSA by PCR NEGATIVE  NEGATIVE Final   Comment:            The GeneXpert MRSA Assay (FDA     approved for NASAL specimens     only), is one component of a     comprehensive MRSA colonization     surveillance program. It is not     intended to diagnose MRSA     infection nor to guide or     monitor treatment for     MRSA infections.     Studies: Dg Chest 2 View  12/02/2012  *RADIOLOGY REPORT*  Clinical Data: Fever, cough, congestion, history of pneumonia  CHEST - 2 VIEW  Comparison: CT chest of 10/11/2010, chest x-ray of  the same date, and chest x-ray of 04/16/2008  Findings:  There is now a new opacity within the left mid lung which appears to involve the left upper lobe/lingula and is most consistent with pneumonia.  However this opacity is somewhat mass- like and continued follow-up is recommended to ensure clearing. There is a small left pleural effusion blunting the posterior costophrenic angle.  The right lung is now clear.  Mediastinal contours are stable.  No bony abnormality is seen.  Mild cardiomegaly is stable  IMPRESSION:  1.  New opacity in the anterior left lobe - lingula most consistent with pneumonia in view of the rapid appearance.  Recommend follow- up to ensure complete clearing. 2.  Tiny left pleural effusion. 3.  Stable mild cardiomegaly.   Original Report Authenticated By: Dwyane Dee, M.D.     Scheduled Meds: . allopurinol  150 mg Oral BID  . amLODipine  5 mg Oral Daily  . aspirin EC  81 mg Oral Daily  . atenolol  25 mg Oral BID  . atorvastatin  20 mg Oral q1800  . azithromycin  500 mg Intravenous Q24H  . cefTRIAXone (ROCEPHIN)  IV  1 g Intravenous Q24H  . guaiFENesin  600 mg Oral BID  . heparin  5,000 Units Subcutaneous Q8H  . hydrochlorothiazide  12.5 mg Oral Daily  . ipratropium  0.5 mg Nebulization QID  . LORazepam  0.5 mg Oral Once   Continuous Infusions: . sodium chloride 150 mL/hr at 12/03/12 0549    Principal Problem:   CAP (community acquired pneumonia) Active Problems:   SOB (shortness of breath)   HTN (hypertension)    Time spent: 35 minutes    [facility] A  [facility] Pager [phone]. If 7PM-7AM, please contact night-coverage at [email or web address], password [facility] 12/03/2012, 10:01 AM  LOS: 1 day

## 2012-12-03 NOTE — Progress Notes (Signed)
Utilization review completed. Prajwal Fellner, RN, BSN. 

## 2012-12-03 NOTE — Evaluation (Addendum)
Occupational Therapy Evaluation Patient Details Name: [name] MRN: [id number] DOB: May 02, 1923 Today's Date: 12/03/2012 Time: 7829-5621 OT Time Calculation (min): 22 min  OT Assessment / Plan / Recommendation Clinical Impression  Pt is an 79 yr old female admitted with PNA and weakness.  Pt overall needs supervision for simulated selfcare tasks.  Only able to tolerate limited activity secondary to SOB and fatigue.  Feel she will benefit from acute care OT to help increase overall independence and endurance.  Feel pt will benefit from initial supervision if discharged home soon.  Pt lives ion a 2 level apartment with 15 steps up to get to her bedroom and bath.  Her neice has stated that the pt can stay with her if needed.  Feel she will benefit from [facility] at discharge.    OT Assessment  Patient needs continued OT Services    Follow Up Recommendations  Home health OT    Barriers to Discharge Decreased caregiver support    Equipment Recommendations  3 in 1 bedside comode;Tub/shower seat       Frequency  Min 2X/week    Precautions / Restrictions Precautions Precautions: Fall Restrictions Weight Bearing Restrictions: No   Pertinent Vitals/Pain O2 sats 91% on 2Ls at rest decreasing to 86% with activity on 2Ls    ADL  Eating/Feeding: Simulated;Independent Where Assessed - Eating/Feeding: Chair Grooming: Simulated;Set up Where Assessed - Grooming: Unsupported sitting Upper Body Bathing: Simulated;Set up Where Assessed - Upper Body Bathing: Unsupported sitting Lower Body Bathing: Simulated;Supervision/safety Where Assessed - Lower Body Bathing: Supported sit to stand Upper Body Dressing: Simulated;Set up Where Assessed - Upper Body Dressing: Unsupported sitting Lower Body Dressing: Simulated Where Assessed - Lower Body Dressing: Unsupported sitting Toilet Transfer: Simulated;Supervision/safety Toilet Transfer Method: Stand pivot Toilet Transfer Equipment: Other (comment)  (simulated to bedside chair, pt declined need to toilet.) Toileting - Clothing Manipulation and Hygiene: Simulated;Supervision/safety Where Assessed - Toileting Clothing Manipulation and Hygiene: Sit to stand from 3-in-1 or toilet Equipment Used: Other (comment) (O2) Transfers/Ambulation Related to ADLs: Pt supervision for taking 3-4 steps to the bedside chair.  Pt unable to walk any further during this session. ADL Comments: Pt with limited endurance and SOB with minimal exertion.  Pt's O2 sats decreased to 86% on 2Ls with transfer to bedside chair and were at 91% with rest.      OT Diagnosis: Generalized weakness  OT Problem List: Decreased strength;Decreased activity tolerance;Cardiopulmonary status limiting activity;Decreased knowledge of use of DME or AE OT Treatment Interventions: Self-care/ADL training;Therapeutic activities;Energy conservation;Patient/family education;DME and/or AE instruction;Balance training   OT Goals Acute Rehab OT Goals OT Goal Formulation: With patient Time For Goal Achievement: 12/17/12 Potential to Achieve Goals: Good ADL Goals Pt Will Perform Grooming: with modified independence;Standing at sink ADL Goal: Grooming - Progress: Goal set today Pt Will Perform Lower Body Bathing: with modified independence;Sit to stand from bed;Unsupported ADL Goal: Lower Body Bathing - Progress: Goal set today Pt Will Perform Lower Body Dressing: with modified independence;Sit to stand from bed ADL Goal: Lower Body Dressing - Progress: Goal set today Pt Will Transfer to Toilet: with modified independence;with DME;3-in-1 ADL Goal: Toilet Transfer - Progress: Goal set today Pt Will Perform Toileting - Clothing Manipulation: with modified independence;Sitting on 3-in-1 or toilet;Standing ADL Goal: Toileting - Clothing Manipulation - Progress: Goal set today Pt Will Perform Toileting - Hygiene: with modified independence;Sitting on 3-in-1 or toilet ADL Goal: Toileting - Hygiene  - Progress: Goal set today Pt Will Perform Tub/Shower Transfer: with modified  independence;Ambulation;Shower seat with back;with DME ADL Goal: Tub/Shower Transfer - Progress: Goal set today Miscellaneous OT Goals Miscellaneous OT Goal #1: Pt will independently verbalize at least 3 energy conservation strategies for selfcare tasks. OT Goal: Miscellaneous Goal #1 - Progress: Goal set today  Visit Information  Last OT Received On: 12/03/12 Assistance Needed: +1    Subjective Data  Subjective: I can't walk I get too SOB.   Prior Functioning     Home Living Lives With: Alone Available Help at Discharge: Family;Other (Comment) (Pt's neice say she can stay with them.) Type of Home: Apartment Home Layout: Two level;Bed/bath upstairs Alternate Level Stairs-Number of Steps: 15 Bathroom Shower/Tub: Engineer, manufacturing systems: Standard Bathroom Accessibility: Yes Home Adaptive Equipment: None Additional Comments: Pt would have to get up 15 steps to get to her bedroom and bathroom if she went home alone. Prior Function Level of Independence: Independent Able to Take Stairs?: Reciprically Driving: No Vocation: Retired Musician: No difficulties Dominant Hand: Right         Vision/Perception Vision - History Baseline Vision: No visual deficits Patient Visual Report: No change from baseline Vision - Assessment Eye Alignment: Within Functional Limits Vision Assessment: Vision not tested Perception Perception: Within Functional Limits Praxis Praxis: Intact   Cognition  Cognition Arousal/Alertness: Awake/alert Behavior During Therapy: WFL for tasks assessed/performed Overall Cognitive Status: Within Functional Limits for tasks assessed    Extremity/Trunk Assessment Right Upper Extremity Assessment RUE ROM/Strength/Tone: Within functional levels RUE Sensation: WFL - Light Touch RUE Coordination: WFL - gross/fine motor Left Upper Extremity  Assessment LUE ROM/Strength/Tone: Within functional levels LUE Sensation: WFL - Light Touch LUE Coordination: WFL - gross/fine motor Trunk Assessment Trunk Assessment: Normal     Mobility Bed Mobility Bed Mobility: Supine to Sit Supine to Sit: 5: Supervision;With rails Transfers Transfers: Sit to Stand;Stand to Sit Sit to Stand: 5: Supervision;With upper extremity assist;From bed Stand to Sit: 5: Supervision;With upper extremity assist;To chair/3-in-1        Balance Balance Balance Assessed: Yes Dynamic Standing Balance Dynamic Standing - Balance Support: No upper extremity supported Dynamic Standing - Level of Assistance: 5: Stand by assistance   End of Session OT - End of Session Activity Tolerance: Patient limited by fatigue Patient left: in chair;with call bell/phone within reach;with family/visitor present Nurse Communication: Mobility status     Minetta Krisher OTR/L Pager number [phone] 12/03/2012, 2:07 PM

## 2012-12-04 DIAGNOSIS — D72819 Decreased white blood cell count, unspecified: Secondary | ICD-10-CM

## 2012-12-04 LAB — BASIC METABOLIC PANEL
Calcium: 8.9 mg/dL (ref 8.4–10.5)
Creatinine, Ser: 0.68 mg/dL (ref 0.50–1.10)
GFR calc non Af Amer: 76 mL/min — ABNORMAL LOW (ref >90)
Sodium: 137 mEq/L (ref 135–145)

## 2012-12-04 LAB — CBC
MCV: 87.3 fL (ref 78.0–100.0)
Platelets: 198 10*3/uL (ref 150–400)
RBC: 3.3 MIL/uL — ABNORMAL LOW (ref 3.87–5.11)
RDW: 14.3 % (ref 11.5–15.5)
WBC: 13.3 10*3/uL — ABNORMAL HIGH (ref 4.0–10.5)

## 2012-12-04 MED ORDER — AZITHROMYCIN 500 MG PO TABS
500.0000 mg | ORAL_TABLET | ORAL | Status: DC
  Administered 2012-12-04 – 2012-12-05 (×2): 500 mg via ORAL
  Filled 2012-12-04 (×3): qty 1

## 2012-12-04 MED ORDER — POTASSIUM CHLORIDE CRYS ER 20 MEQ PO TBCR
40.0000 meq | EXTENDED_RELEASE_TABLET | Freq: Four times a day (QID) | ORAL | Status: AC
  Administered 2012-12-04 (×2): 40 meq via ORAL
  Filled 2012-12-04 (×2): qty 2

## 2012-12-04 NOTE — Evaluation (Signed)
Physical Therapy Evaluation Patient Details Name: [name] MRN: [id number] DOB: 05/29/1923 Today's Date: 12/04/2012 Time: 0910-0920 PT Time Calculation (min): 10 min  PT Assessment / Plan / Recommendation Clinical Impression  Pt adm with PNA.  Currently pt limited by SOB.  Needs skilled PT to maximize I and safety so pt can be dc'd to sister's house.  Expect pt will make steady progress with activity tolerance and mobility. Recommend HHPT at dc.    PT Assessment  Patient needs continued PT services    Follow Up Recommendations  Home health PT    Does the patient have the potential to tolerate intense rehabilitation      Barriers to Discharge        Equipment Recommendations  None recommended by PT    Recommendations for Other Services     Frequency Min 3X/week    Precautions / Restrictions Precautions Precautions: Fall   Pertinent Vitals/Pain SaO2 97% on 2L      Mobility  Transfers Sit to Stand: 5: Supervision;With upper extremity assist;With armrests;From chair/3-in-1 Stand to Sit: 5: Supervision;With upper extremity assist;To chair/3-in-1;With armrests Ambulation/Gait Ambulation/Gait Assistance: Not tested (comment) (Pt refused due to SOB.)    Exercises     PT Diagnosis: Difficulty walking  PT Problem List: Decreased activity tolerance;Decreased balance;Decreased mobility PT Treatment Interventions: DME instruction;Gait training;Patient/family education;Stair training;Functional mobility training;Therapeutic activities;Therapeutic exercise;Balance training   PT Goals Acute Rehab PT Goals PT Goal Formulation: With patient Time For Goal Achievement: 12/11/12 Potential to Achieve Goals: Good Pt will go Sit to Stand: with modified independence PT Goal: Sit to Stand - Progress: Goal set today Pt will go Stand to Sit: with modified independence PT Goal: Stand to Sit - Progress: Goal set today Pt will Ambulate: 51 - 150 feet;with modified independence;with  least restrictive assistive device PT Goal: Ambulate - Progress: Goal set today Pt will Go Up / Down Stairs: 3-5 stairs;with supervision PT Goal: Up/Down Stairs - Progress: Goal set today  Visit Information  Last PT Received On: 12/04/12 Assistance Needed: +1    Subjective Data  Subjective: Pt stated that she was too SOB to walk any. Patient Stated Goal: Go to sister's home.   Prior Functioning  Home Living Lives With: Alone Available Help at Discharge: Family;Other (Comment) Type of Home: Apartment Home Layout: Two level;Bed/bath upstairs Alternate Level Stairs-Number of Steps: 15 Bathroom Shower/Tub: Engineer, manufacturing systems: Standard Bathroom Accessibility: Yes Additional Comments: Pt would have to get up 15 steps to get to her bedroom and bathroom if she went home alone. Pt plans to go to sister's house. Prior Function Level of Independence: Independent Able to Take Stairs?: Reciprically Driving: No Vocation: Retired Musician: No difficulties Dominant Hand: Right    Cognition  Cognition Arousal/Alertness: Awake/alert Behavior During Therapy: WFL for tasks assessed/performed Overall Cognitive Status: Within Functional Limits for tasks assessed    Extremity/Trunk Assessment Right Lower Extremity Assessment RLE ROM/Strength/Tone: WFL for tasks assessed Left Lower Extremity Assessment LLE ROM/Strength/Tone: WFL for tasks assessed   Balance Static Standing Balance Static Standing - Balance Support: No upper extremity supported Static Standing - Level of Assistance: 5: Stand by assistance  End of Session PT - End of Session Activity Tolerance: Patient limited by fatigue Patient left: in chair;with call bell/phone within reach  GP     [facility] 12/04/2012, 9:54 AM  [facility] PT [phone]

## 2012-12-04 NOTE — Progress Notes (Signed)
[facility] PROGRESS NOTE  [name] WUJ:[id number] DOB: 05-Aug-1923 DOA: 12/02/2012 PCP: Pearson Grippe, MD  HPI/Subjective: Has some shortness of breath, continue current management.  Assessment/Plan:  Pneumonia  -Community acquired pneumonia, affecting the lingula.  -Patient started on Rocephin and azithromycin in the emergency department with continued.  -Supportive management with inhaled bronchodilators, mucolytics and oxygen as needed.  -Negative urine strep antigen and HIV screening.  Leukocytosis -This is likely secondary to the pneumonia. -Wbc's count decreased from 20.6 to 13.3 today.  Hypertension  -Restart home medications, patient blood pressure is reasonably controlled.   Hypokalemia  -Mild hypokalemia with potassium 3.4.  -Replace with oral supplements. Check BMP in a.m.   Code Status: Full code Family Communication:  Disposition Plan: Remains inpatient, telemetry   Consultants:  None  Procedures:  None  Antibiotics:  On Rocephin and azithromycin for community-acquired pneumonia  Objective: Filed Vitals:   12/03/12 1715 12/03/12 2107 12/04/12 0418 12/04/12 0906  BP:  118/74 148/76   Pulse: 80 77 83   Temp:  98.2 F (36.8 C) 97.9 F (36.6 C)   TempSrc:  Oral Oral   Resp:   18   Height:      Weight:      SpO2: 91% 98% 92% 94%    Intake/Output Summary (Last 24 hours) at 12/04/12 1051 Last data filed at 12/04/12 0730  Gross per 24 hour  Intake   4785 ml  Output   1050 ml  Net   3735 ml   Filed Weights   12/02/12 2013  Weight: 48.6 kg (107 lb 2.3 oz)    Exam:  General: Alert and awake, oriented x3, not in any acute distress. HEENT: anicteric sclera, pupils reactive to light and accommodation, EOMI CVS: S1-S2 clear, no murmur rubs or gallops Chest: clear to auscultation bilaterally, no wheezing, rales or rhonchi Abdomen: soft nontender, nondistended, normal bowel sounds, no organomegaly Extremities: no cyanosis, clubbing  or edema noted bilaterally Neuro: Cranial nerves II-XII intact, no focal neurological deficits  Data Reviewed: Basic Metabolic Panel:  Recent Labs Lab 12/02/12 1540 12/02/12 1619 12/03/12 0605 12/04/12 0607  NA 136 137 135 137  K 3.4* 3.4* 4.2 3.4*  CL 96 98 102 104  CO2 30  --  26 24  GLUCOSE 109* 110* 97 103*  BUN 15 14 16 11   CREATININE 0.91 1.00 0.90 0.68  CALCIUM 9.6  --  8.7 8.9   Liver Function Tests:  Recent Labs Lab 12/02/12 1540  AST 17  ALT 11  ALKPHOS 71  BILITOT 0.6  PROT 6.6  ALBUMIN 3.0*   No results found for this basename: LIPASE, AMYLASE,  in the last 168 hours No results found for this basename: AMMONIA,  in the last 168 hours CBC:  Recent Labs Lab 12/02/12 1540 12/02/12 1619 12/02/12 2003 12/03/12 0605 12/04/12 0607  WBC 19.1*  --  20.6* 16.1* 13.3*  NEUTROABS 16.2*  --   --   --   --   HGB 11.4* 11.9* 10.6* 10.4* 9.9*  HCT 33.6* 35.0* 31.5* 30.1* 28.8*  MCV 87.0  --  87.0 87.2 87.3  PLT 236  --  231 210 198   Cardiac Enzymes: No results found for this basename: CKTOTAL, CKMB, CKMBINDEX, TROPONINI,  in the last 168 hours BNP (last 3 results) No results found for this basename: PROBNP,  in the last 8760 hours CBG: No results found for this basename: GLUCAP,  in the last 168 hours  Recent Results (from the  past 240 hour(s))  MRSA PCR SCREENING     Status: None   Collection Time    12/02/12  7:50 PM      Result Value Range Status   MRSA by PCR NEGATIVE  NEGATIVE Final   Comment:            The GeneXpert MRSA Assay (FDA     approved for NASAL specimens     only), is one component of a     comprehensive MRSA colonization     surveillance program. It is not     intended to diagnose MRSA     infection nor to guide or     monitor treatment for     MRSA infections.  CULTURE, BLOOD (ROUTINE X 2)     Status: None   Collection Time    12/02/12  7:52 PM      Result Value Range Status   Specimen Description BLOOD RIGHT HAND   Final    Special Requests     Final   Value: BOTTLES DRAWN AEROBIC AND ANAEROBIC 10CC AER,7CC ANA   Culture  Setup Time 12/03/2012 01:47   Final   Culture     Final   Value: GRAM NEGATIVE COCCOBACILLI     Note: Gram Stain Report Called to,Read Back By and Verified With: TY SULLIVAN AT 0302 12/04/12 BY SNOLO   Report Status PENDING   Incomplete  CULTURE, BLOOD (ROUTINE X 2)     Status: None   Collection Time    12/02/12  8:04 PM      Result Value Range Status   Specimen Description BLOOD RIGHT ARM   Final   Special Requests BOTTLES DRAWN AEROBIC AND ANAEROBIC 10CC   Final   Culture  Setup Time 12/03/2012 01:47   Final   Culture     Final   Value: GRAM NEGATIVE COCCOBACILLI     Note: Gram Stain Report Called to,Read Back By and Verified With: TY SULLIVAN AT 0303 12/04/12 BY SNOLO   Report Status PENDING   Incomplete     Studies: Dg Chest 2 View  12/02/2012  *RADIOLOGY REPORT*  Clinical Data: Fever, cough, congestion, history of pneumonia  CHEST - 2 VIEW  Comparison: CT chest of 10/11/2010, chest x-ray of the same date, and chest x-ray of 04/16/2008  Findings:  There is now a new opacity within the left mid lung which appears to involve the left upper lobe/lingula and is most consistent with pneumonia.  However this opacity is somewhat mass- like and continued follow-up is recommended to ensure clearing. There is a small left pleural effusion blunting the posterior costophrenic angle.  The right lung is now clear.  Mediastinal contours are stable.  No bony abnormality is seen.  Mild cardiomegaly is stable  IMPRESSION:  1.  New opacity in the anterior left lobe - lingula most consistent with pneumonia in view of the rapid appearance.  Recommend follow- up to ensure complete clearing. 2.  Tiny left pleural effusion. 3.  Stable mild cardiomegaly.   Original Report Authenticated By: Dwyane Dee, M.D.     Scheduled Meds: . albuterol  2.5 mg Nebulization BID  . allopurinol  150 mg Oral BID  . amLODipine  5  mg Oral Daily  . aspirin EC  81 mg Oral Daily  . atenolol  25 mg Oral BID  . atorvastatin  20 mg Oral q1800  . azithromycin  500 mg Oral Q24H  . cefTRIAXone (ROCEPHIN)  IV  1  g Intravenous Q24H  . guaiFENesin  600 mg Oral BID  . heparin  5,000 Units Subcutaneous Q8H  . hydrochlorothiazide  12.5 mg Oral Daily  . ipratropium  0.5 mg Nebulization BID  . LORazepam  0.5 mg Oral Once   Continuous Infusions: . sodium chloride 150 mL/hr at 12/04/12 0423    Principal Problem:   CAP (community acquired pneumonia) Active Problems:   SOB (shortness of breath)   HTN (hypertension)   Leukocytopenia, unspecified    Time spent: 35 minutes    [facility]  [facility] Pager [phone]. If 7PM-7AM, please contact night-coverage at [email or web address], password [facility] 12/04/2012, 10:51 AM  LOS: 2 days

## 2012-12-05 LAB — CBC
MCV: 87 fL (ref 78.0–100.0)
Platelets: 246 10*3/uL (ref 150–400)
RBC: 3.54 MIL/uL — ABNORMAL LOW (ref 3.87–5.11)
RDW: 14.1 % (ref 11.5–15.5)
WBC: 9.4 10*3/uL (ref 4.0–10.5)

## 2012-12-05 LAB — BASIC METABOLIC PANEL
CO2: 27 mEq/L (ref 19–32)
Calcium: 9.4 mg/dL (ref 8.4–10.5)
Chloride: 100 mEq/L (ref 96–112)
Creatinine, Ser: 0.76 mg/dL (ref 0.50–1.10)
GFR calc Af Amer: 85 mL/min — ABNORMAL LOW (ref >90)
Sodium: 133 mEq/L — ABNORMAL LOW (ref 135–145)

## 2012-12-05 MED ORDER — WHITE PETROLATUM GEL
Status: AC
  Administered 2012-12-05: 11:00:00
  Filled 2012-12-05: qty 5

## 2012-12-05 MED ORDER — SIMETHICONE 80 MG PO CHEW
80.0000 mg | CHEWABLE_TABLET | Freq: Three times a day (TID) | ORAL | Status: DC | PRN
  Administered 2012-12-05 – 2012-12-06 (×3): 80 mg via ORAL
  Filled 2012-12-05 (×4): qty 1

## 2012-12-05 NOTE — Progress Notes (Signed)
[facility] PROGRESS NOTE  ALEXY BRINGLE ZOX:[id number] DOB: 17-Apr-1923 DOA: 12/02/2012 PCP: Pearson Grippe, MD  HPI/Subjective: Feels much better since yesterday, denies any SOB.  Assessment/Plan:  Pneumonia  -Community acquired pneumonia, affecting the lingula.  -Patient started on Rocephin and azithromycin in the emergency department with continued.  -Supportive management with inhaled bronchodilators, mucolytics and oxygen as needed.  -Negative urine strep antigen and HIV screening.  Leukocytosis -This is likely secondary to the pneumonia. -Wbc's count decreased from 20.6 to 13.3 today.  Hypertension  -Restart home medications, patient blood pressure is reasonably controlled.   Hypokalemia  -Mild hypokalemia with potassium 3.4. Improved with oral supplementation -Replace with oral supplements. Check BMP in a.m.   Code Status: Full code Family Communication:  Disposition Plan: Remains inpatient, telemetry   Consultants:  None  Procedures:  None  Antibiotics:  On Rocephin and azithromycin for community-acquired pneumonia  Objective: Filed Vitals:   12/04/12 2154 12/05/12 0545 12/05/12 0928 12/05/12 1038  BP: 124/66 154/82  124/66  Pulse: 76 68  79  Temp: 97.6 F (36.4 C) 97.7 F (36.5 C)    TempSrc: Oral Oral    Resp: 18 18    Height:      Weight:      SpO2: 100% 94% 98%     Intake/Output Summary (Last 24 hours) at 12/05/12 1058 Last data filed at 12/05/12 0900  Gross per 24 hour  Intake    212 ml  Output   1300 ml  Net  -1088 ml   Filed Weights   12/02/12 2013  Weight: 48.6 kg (107 lb 2.3 oz)    Exam:  General: Alert and awake, oriented x3, not in any acute distress. HEENT: anicteric sclera, pupils reactive to light and accommodation, EOMI CVS: S1-S2 clear, no murmur rubs or gallops Chest: clear to auscultation bilaterally, no wheezing, rales or rhonchi Abdomen: soft nontender, nondistended, normal bowel sounds, no  organomegaly Extremities: no cyanosis, clubbing or edema noted bilaterally Neuro: Cranial nerves II-XII intact, no focal neurological deficits  Data Reviewed: Basic Metabolic Panel:  Recent Labs Lab 12/02/12 1540 12/02/12 1619 12/03/12 0605 12/04/12 0607 12/05/12 0545  NA 136 137 135 137 133*  K 3.4* 3.4* 4.2 3.4* 3.9  CL 96 98 102 104 100  CO2 30  --  26 24 27   GLUCOSE 109* 110* 97 103* 97  BUN 15 14 16 11 11   CREATININE 0.91 1.00 0.90 0.68 0.76  CALCIUM 9.6  --  8.7 8.9 9.4   Liver Function Tests:  Recent Labs Lab 12/02/12 1540  AST 17  ALT 11  ALKPHOS 71  BILITOT 0.6  PROT 6.6  ALBUMIN 3.0*   No results found for this basename: LIPASE, AMYLASE,  in the last 168 hours No results found for this basename: AMMONIA,  in the last 168 hours CBC:  Recent Labs Lab 12/02/12 1540 12/02/12 1619 12/02/12 2003 12/03/12 0605 12/04/12 0607 12/05/12 0545  WBC 19.1*  --  20.6* 16.1* 13.3* 9.4  NEUTROABS 16.2*  --   --   --   --   --   HGB 11.4* 11.9* 10.6* 10.4* 9.9* 10.2*  HCT 33.6* 35.0* 31.5* 30.1* 28.8* 30.8*  MCV 87.0  --  87.0 87.2 87.3 87.0  PLT 236  --  231 210 198 246   Cardiac Enzymes: No results found for this basename: CKTOTAL, CKMB, CKMBINDEX, TROPONINI,  in the last 168 hours BNP (last 3 results) No results found for this basename: PROBNP,  in  the last 8760 hours CBG: No results found for this basename: GLUCAP,  in the last 168 hours  Recent Results (from the past 240 hour(s))  MRSA PCR SCREENING     Status: None   Collection Time    12/02/12  7:50 PM      Result Value Range Status   MRSA by PCR NEGATIVE  NEGATIVE Final   Comment:            The GeneXpert MRSA Assay (FDA     approved for NASAL specimens     only), is one component of a     comprehensive MRSA colonization     surveillance program. It is not     intended to diagnose MRSA     infection nor to guide or     monitor treatment for     MRSA infections.  CULTURE, BLOOD (ROUTINE X 2)      Status: None   Collection Time    12/02/12  7:52 PM      Result Value Range Status   Specimen Description BLOOD RIGHT HAND   Final   Special Requests     Final   Value: BOTTLES DRAWN AEROBIC AND ANAEROBIC 10CC AER,7CC ANA   Culture  Setup Time 12/03/2012 01:47   Final   Culture     Final   Value: GRAM NEGATIVE COCCOBACILLI     Note: Gram Stain Report Called to,Read Back By and Verified With: TY SULLIVAN AT 0302 12/04/12 BY SNOLO   Report Status PENDING   Incomplete  CULTURE, BLOOD (ROUTINE X 2)     Status: None   Collection Time    12/02/12  8:04 PM      Result Value Range Status   Specimen Description BLOOD RIGHT ARM   Final   Special Requests BOTTLES DRAWN AEROBIC AND ANAEROBIC 10CC   Final   Culture  Setup Time 12/03/2012 01:47   Final   Culture     Final   Value: GRAM NEGATIVE COCCOBACILLI     Note: Gram Stain Report Called to,Read Back By and Verified With: TY SULLIVAN AT 0303 12/04/12 BY SNOLO   Report Status PENDING   Incomplete     Studies: No results found.  Scheduled Meds: . albuterol  2.5 mg Nebulization BID  . allopurinol  150 mg Oral BID  . amLODipine  5 mg Oral Daily  . aspirin EC  81 mg Oral Daily  . atenolol  25 mg Oral BID  . atorvastatin  20 mg Oral q1800  . azithromycin  500 mg Oral Q24H  . cefTRIAXone (ROCEPHIN)  IV  1 g Intravenous Q24H  . guaiFENesin  600 mg Oral BID  . heparin  5,000 Units Subcutaneous Q8H  . hydrochlorothiazide  12.5 mg Oral Daily  . ipratropium  0.5 mg Nebulization BID  . LORazepam  0.5 mg Oral Once   Continuous Infusions:    Principal Problem:   CAP (community acquired pneumonia) Active Problems:   SOB (shortness of breath)   HTN (hypertension)   Leukocytopenia, unspecified    Time spent: 35 minutes    [facility]  [facility] Pager [phone]. If 7PM-7AM, please contact night-coverage at [email or web address], password [facility] 12/05/2012, 10:58 AM  LOS: 3 days

## 2012-12-05 NOTE — Progress Notes (Signed)
Removed one staple from patients head.  Madelin Rear RN, MSN, [employer]

## 2012-12-06 DIAGNOSIS — A413 Sepsis due to Hemophilus influenzae: Secondary | ICD-10-CM | POA: Diagnosis present

## 2012-12-06 MED ORDER — LEVOFLOXACIN 750 MG PO TABS: 750.0000 mg | ORAL_TABLET | Freq: Every day | ORAL | Status: DC

## 2012-12-06 MED ORDER — OXYCODONE-ACETAMINOPHEN 5-325 MG PO TABS: 1.0000 | ORAL_TABLET | Freq: Two times a day (BID) | ORAL | Status: DC

## 2012-12-06 NOTE — Progress Notes (Signed)
   CARE MANAGEMENT NOTE 12/06/2012  Patient:  LINETTE, GUNDERSON   Account Number:  [id number]  Date Initiated:  12/06/2012  Documentation initiated by:  [facility]  Subjective/Objective Assessment:   CAP     Action/Plan:   HH vs SNF, lives at home alone   Anticipated DC Date:  12/06/2012   Anticipated DC Plan:  HOME W HOME HEALTH SERVICES      DC Planning Services  CM consult      [facility] Choice  HOME HEALTH   Choice offered to / List presented to:  C-1 Patient        HH arranged  HH-2 PT  HH-3 OT  HH-1 RN      [facility] agency  [facility]   Status of service:  Completed, signed off Medicare Important Message given?   (If response is "NO", the following Medicare IM given date fields will be blank) Date Medicare IM given:   Date Additional Medicare IM given:    Discharge Disposition:  HOME W HOME HEALTH SERVICES  Per UR Regulation:    If discussed at Long Length of Stay Meetings, dates discussed:    Comments:  12/06/2012 1030 NCM spoke to pt and states she is going to stay with sister at her nephew's home, [address], [place] [phone]. States she has walker at home. She is wanting a cane, but explained it is not covered by insurance. States her sister has several canes at home. Contacted [facility] rep with referral. Isidoro Donning RN CCM Case Mgmt phone [phone]  sister -Gwendolyn Grant

## 2012-12-06 NOTE — Progress Notes (Signed)
Physical Therapy Treatment Patient Details Name: [name] MRN: [id number] DOB: [birth year] Today's Date: 12/06/2012 Time: 4098-1191 PT Time Calculation (min): 25 min  PT Assessment / Plan / Recommendation Comments on Treatment Session  Pt is able to ambulate well with O2 sats maintained > 93% at all times on RA.  She has some gait istability that is corrected with sue of straight cane.  Anticipate she will continue to progress in gait tolerance as she recovers from illness. Feel patient is too hight level for home health PT, but recommend straight cane for ambulation safety    Follow Up Recommendations  No PT follow up     Does the patient have the potential to tolerate intense rehabilitation     Barriers to Discharge        Equipment Recommendations  Cane    Recommendations for Other Services    Frequency     Plan Discharge plan needs to be updated;Frequency remains appropriate;Equipment recommendations need to be updated    Precautions / Restrictions Precautions Precautions: Fall   Pertinent Vitals/Pain No c/o pain, but does c/o fatigue at end of ambulation    Mobility  Bed Mobility Details for Bed Mobility Assistance: Pt sitting up in chair Transfers Sit to Stand: With upper extremity assist;With armrests;From chair/3-in-1;7: Independent Stand to Sit: With upper extremity assist;To chair/3-in-1;With armrests;7: Independent Ambulation/Gait Ambulation/Gait Assistance: 6: Modified independent (Device/Increase time);5: Supervision (mod I with stratight can, supervision with no device) Ambulation Distance (Feet): 350 Feet (300 with none, 50 with straight cane) Assistive device: Straight cane Ambulation/Gait Assistance Details: supervision with not device due to occasional ataxia "I feel a lttle tottery" Gait Pattern: Within Functional Limits Gait velocity: self selects decreased speed General Gait Details: pt carries cane in right hand with aternating pattern to assist  with balance stability in gait.  Pt is able to change gait velocity without loss of balance Stairs: No Wheelchair Mobility Wheelchair Mobility: No  O2 sat on RA at rest:  95% O2 sat on RA with ambulation: 93%    Exercises Other Exercises Other Exercises: repeated sit to stand x 3 repetitions   PT Diagnosis:    PT Problem List:   PT Treatment Interventions:     PT Goals Acute Rehab PT Goals PT Goal Formulation: With patient Time For Goal Achievement: 12/11/12 Potential to Achieve Goals: Good Pt will go Sit to Stand: with modified independence PT Goal: Sit to Stand - Progress: Met Pt will go Stand to Sit: with modified independence PT Goal: Stand to Sit - Progress: Met Pt will Ambulate: 51 - 150 feet;with modified independence;with least restrictive assistive device PT Goal: Ambulate - Progress: Met Pt will Go Up / Down Stairs: 3-5 stairs;with supervision  Visit Information  Last PT Received On: 12/06/12 Assistance Needed: +1    Subjective Data  Subjective: "I believe this cane will help me" Patient Stated Goal: Go to sister's home.   Cognition  Cognition Arousal/Alertness: Awake/alert Behavior During Therapy: WFL for tasks assessed/performed Overall Cognitive Status: Within Functional Limits for tasks assessed    Balance     End of Session PT - End of Session Activity Tolerance: Patient limited by fatigue Patient left: in chair;with call bell/phone within reach Nurse Communication: Mobility status   GP  Rosey Bath K. [place],  [phone] 12/06/2012, 10:20 AM

## 2012-12-06 NOTE — Discharge Summary (Signed)
Physician Discharge Summary  [name] RUE:[id number] DOB: 10-03-1922 DOA: 12/02/2012  PCP: Pearson Grippe, MD  Admit date: 12/02/2012 Discharge date: 12/06/2012  Time spent: 40 minutes  Recommendations for Outpatient Follow-up:  1. Followup with primary care physician with one week  Discharge Diagnoses:  Principal Problem:   CAP (community acquired pneumonia) Active Problems:   SOB (shortness of breath)   HTN (hypertension)   Leukocytopenia, unspecified   Discharge Condition: Stable  Diet recommendation: Regular diet  Filed Weights   12/02/12 2013  Weight: 48.6 kg (107 lb 2.3 oz)    History of present illness:  [name] is a 79 y.o. female with basilar dose of hypertension and gouty arthritis came to the hospital because of shortness of breath. Patient said she was in her usual she felt to last night she started to have shortness of breath associated with minimally productive cough. Patient also has pleurisy with some left-sided chest pain on inspiration and cough. She denies any wheezing, denies any sputum production. Upon initial evaluation in the emergency department chest x-ray showed lingular pneumonia and her CBC showed WBCs of 19.1.  Hospital Course:   1. Haemophilus influenzae bacteremia: Likely secondary to her pneumonia, patient was presented with WBC of 20.6 and shortness of breath as well as pneumonia. Treated initially with Rocephin, the blood culture comes back positive for H. influenzae, none beta-lactamase producing. Although that the first line for treatment is high-dose amoxicillin, patient is allergic to penicillin so high dose of Levaquin was used instead, total 14 days.  2. Pneumonia: Community acquired pneumonia affecting the lingula, as mentioned above patient's IV initial Rocephin and azithromycin, supportive management with inhaled bronchodilators, mucolytics and oxygen as needed. Patient was negative for urine Streptococcus antigen, Legionella and  HIV screening. As mentioned above blood culture comes back positive for H. influenzae. Patient will be on antibiotics for a total of 2 weeks.  3. Leukocytosis: Patient presented with WBCs of 20.6, likely part of early sepsis picture. WBC count went down to normal on the day of discharge.  4. Hypertension: Patient did not have any hypotension, her home medications all continued throughout this hospital stay.  Procedures:  None  Consultations:  PT/OT: recommended home health service.  Discharge Exam: Filed Vitals:   12/05/12 2009 12/05/12 2156 12/06/12 0510 12/06/12 0942  BP:  130/63 146/79 142/71  Pulse:  73 77 72  Temp:  97.9 F (36.6 C) 98.1 F (36.7 C)   TempSrc:  Oral Oral   Resp:  18 18   Height:      Weight:      SpO2: 90% 91% 90% 93%   General: Alert and awake, oriented x3, not in any acute distress. HEENT: anicteric sclera, pupils reactive to light and accommodation, EOMI CVS: S1-S2 clear, no murmur rubs or gallops Chest: clear to auscultation bilaterally, no wheezing, rales or rhonchi Abdomen: soft nontender, nondistended, normal bowel sounds, no organomegaly Extremities: no cyanosis, clubbing or edema noted bilaterally Neuro: Cranial nerves II-XII intact, no focal neurological deficits  Discharge Instructions  Discharge Orders   Future Orders Complete By Expires     Increase activity slowly  As directed         Medication List    TAKE these medications       acetaminophen-codeine 300-30 MG per tablet  Commonly known as:  TYLENOL #3  Take 1 tablet by mouth every 6 (six) hours as needed for pain.     allopurinol 300 MG tablet  Commonly  known as:  ZYLOPRIM  Take 150 mg by mouth 2 (two) times daily.     amLODipine 5 MG tablet  Commonly known as:  NORVASC  Take 5 mg by mouth daily.     aspirin EC 81 MG tablet  Take 81 mg by mouth daily.     atenolol 25 MG tablet  Commonly known as:  TENORMIN  Take 25 mg by mouth 2 (two) times daily.     GOODY  HEADACHE PO  Take 1 packet by mouth 2 (two) times daily as needed.     hydrochlorothiazide 12.5 MG capsule  Commonly known as:  MICROZIDE  Take 12.5 mg by mouth daily.     levofloxacin 750 MG tablet  Commonly known as:  LEVAQUIN  Take 1 tablet (750 mg total) by mouth daily.     losartan 100 MG tablet  Commonly known as:  COZAAR  Take 100 mg by mouth daily.     oxyCODONE-acetaminophen 5-325 MG per tablet  Commonly known as:  PERCOCET/ROXICET  Take 1 tablet by mouth 2 (two) times daily.     simvastatin 40 MG tablet  Commonly known as:  ZOCOR  Take 40 mg by mouth every evening.           Follow-up Information   Follow up with Pearson Grippe, MD In 1 week.   Contact information:   [address] [place] [address] [phone]        The results of significant diagnostics from this hospitalization (including imaging, microbiology, ancillary and laboratory) are listed below for reference.    Significant Diagnostic Studies: Dg Chest 2 View  12/02/2012  *RADIOLOGY REPORT*  Clinical Data: Fever, cough, congestion, history of pneumonia  CHEST - 2 VIEW  Comparison: CT chest of 10/11/2010, chest x-ray of the same date, and chest x-ray of 04/16/2008  Findings:  There is now a new opacity within the left mid lung which appears to involve the left upper lobe/lingula and is most consistent with pneumonia.  However this opacity is somewhat mass- like and continued follow-up is recommended to ensure clearing. There is a small left pleural effusion blunting the posterior costophrenic angle.  The right lung is now clear.  Mediastinal contours are stable.  No bony abnormality is seen.  Mild cardiomegaly is stable  IMPRESSION:  1.  New opacity in the anterior left lobe - lingula most consistent with pneumonia in view of the rapid appearance.  Recommend follow- up to ensure complete clearing. 2.  Tiny left pleural effusion. 3.  Stable mild cardiomegaly.   Original Report  Authenticated By: Dwyane Dee, M.D.     Microbiology: Recent Results (from the past 240 hour(s))  MRSA PCR SCREENING     Status: None   Collection Time    12/02/12  7:50 PM      Result Value Range Status   MRSA by PCR NEGATIVE  NEGATIVE Final   Comment:            The GeneXpert MRSA Assay (FDA     approved for NASAL specimens     only), is one component of a     comprehensive MRSA colonization     surveillance program. It is not     intended to diagnose MRSA     infection nor to guide or     monitor treatment for     MRSA infections.  CULTURE, BLOOD (ROUTINE X 2)     Status: None   Collection Time  12/02/12  7:52 PM      Result Value Range Status   Specimen Description BLOOD RIGHT HAND   Final   Special Requests     Final   Value: BOTTLES DRAWN AEROBIC AND ANAEROBIC 10CC AER,7CC ANA   Culture  Setup Time 12/03/2012 01:47   Final   Culture     Final   Value: HAEMOPHILUS INFLUENZAE     Note: BETA LACTAMASE NEGATIVE     Note: Gram Stain Report Called to,Read Back By and Verified With: TY SULLIVAN AT 0302 12/04/12 BY SNOLO   Report Status PENDING   Incomplete  CULTURE, BLOOD (ROUTINE X 2)     Status: None   Collection Time    12/02/12  8:04 PM      Result Value Range Status   Specimen Description BLOOD RIGHT ARM   Final   Special Requests BOTTLES DRAWN AEROBIC AND ANAEROBIC 10CC   Final   Culture  Setup Time 12/03/2012 01:47   Final   Culture     Final   Value: HAEMOPHILUS INFLUENZAE     Note: BETA LACTAMASE NEGATIVE CRITICAL RESULT CALLED TO, READ BACK BY AND VERIFIED WITH: JESSI WESSELINK @ 1610 12/06/12 BY KRAWS     Note: Gram Stain Report Called to,Read Back By and Verified With: TY SULLIVAN AT 0303 12/04/12 BY SNOLO   Report Status PENDING   Incomplete     Labs: Basic Metabolic Panel:  Recent Labs Lab 12/02/12 1540 12/02/12 1619 12/03/12 0605 12/04/12 0607 12/05/12 0545  NA 136 137 135 137 133*  K 3.4* 3.4* 4.2 3.4* 3.9  CL 96 98 102 104 100  CO2 30  --  26  24 27   GLUCOSE 109* 110* 97 103* 97  BUN 15 14 16 11 11   CREATININE 0.91 1.00 0.90 0.68 0.76  CALCIUM 9.6  --  8.7 8.9 9.4   Liver Function Tests:  Recent Labs Lab 12/02/12 1540  AST 17  ALT 11  ALKPHOS 71  BILITOT 0.6  PROT 6.6  ALBUMIN 3.0*   No results found for this basename: LIPASE, AMYLASE,  in the last 168 hours No results found for this basename: AMMONIA,  in the last 168 hours CBC:  Recent Labs Lab 12/02/12 1540 12/02/12 1619 12/02/12 2003 12/03/12 0605 12/04/12 0607 12/05/12 0545  WBC 19.1*  --  20.6* 16.1* 13.3* 9.4  NEUTROABS 16.2*  --   --   --   --   --   HGB 11.4* 11.9* 10.6* 10.4* 9.9* 10.2*  HCT 33.6* 35.0* 31.5* 30.1* 28.8* 30.8*  MCV 87.0  --  87.0 87.2 87.3 87.0  PLT 236  --  231 210 198 246   Cardiac Enzymes: No results found for this basename: CKTOTAL, CKMB, CKMBINDEX, TROPONINI,  in the last 168 hours BNP: BNP (last 3 results) No results found for this basename: PROBNP,  in the last 8760 hours CBG: No results found for this basename: GLUCAP,  in the last 168 hours     Signed:  Adonis Yim A  [facility] 12/06/2012, 11:00 AM

## 2012-12-21 LAB — CULTURE, BLOOD (ROUTINE X 2)

## 2013-06-09 ENCOUNTER — Encounter (HOSPITAL_COMMUNITY): Payer: Self-pay | Admitting: Emergency Medicine

## 2013-06-09 ENCOUNTER — Inpatient Hospital Stay (HOSPITAL_COMMUNITY)
Admission: EM | Admit: 2013-06-09 | Discharge: 2013-06-14 | DRG: 193 | Disposition: A | Discharge status: Skilled Nursing Facility | Payer: Medicare HMO | Attending: Internal Medicine | Admitting: Internal Medicine

## 2013-06-09 ENCOUNTER — Emergency Department (HOSPITAL_COMMUNITY): Payer: Medicare HMO

## 2013-06-09 DIAGNOSIS — M109 Gout, unspecified: Secondary | ICD-10-CM | POA: Diagnosis present

## 2013-06-09 DIAGNOSIS — E876 Hypokalemia: Secondary | ICD-10-CM | POA: Diagnosis present

## 2013-06-09 DIAGNOSIS — I10 Essential (primary) hypertension: Secondary | ICD-10-CM | POA: Diagnosis present

## 2013-06-09 DIAGNOSIS — R509 Fever, unspecified: Secondary | ICD-10-CM

## 2013-06-09 DIAGNOSIS — J209 Acute bronchitis, unspecified: Secondary | ICD-10-CM | POA: Diagnosis present

## 2013-06-09 DIAGNOSIS — Z79899 Other long term (current) drug therapy: Secondary | ICD-10-CM

## 2013-06-09 DIAGNOSIS — D72819 Decreased white blood cell count, unspecified: Secondary | ICD-10-CM | POA: Diagnosis present

## 2013-06-09 DIAGNOSIS — Z681 Body mass index (BMI) 19 or less, adult: Secondary | ICD-10-CM

## 2013-06-09 DIAGNOSIS — R0602 Shortness of breath: Secondary | ICD-10-CM

## 2013-06-09 DIAGNOSIS — E43 Unspecified severe protein-calorie malnutrition: Secondary | ICD-10-CM | POA: Diagnosis present

## 2013-06-09 DIAGNOSIS — M129 Arthropathy, unspecified: Secondary | ICD-10-CM | POA: Diagnosis present

## 2013-06-09 DIAGNOSIS — J189 Pneumonia, unspecified organism: Principal | ICD-10-CM | POA: Diagnosis present

## 2013-06-09 DIAGNOSIS — Z87891 Personal history of nicotine dependence: Secondary | ICD-10-CM

## 2013-06-09 DIAGNOSIS — A413 Sepsis due to Hemophilus influenzae: Secondary | ICD-10-CM

## 2013-06-09 LAB — COMPREHENSIVE METABOLIC PANEL
ALT: 10 U/L (ref 0–35)
AST: 18 U/L (ref 0–37)
Alkaline Phosphatase: 74 U/L (ref 39–117)
CO2: 27 mEq/L (ref 19–32)
Calcium: 9.9 mg/dL (ref 8.4–10.5)
Chloride: 97 mEq/L (ref 96–112)
GFR calc Af Amer: 60 mL/min — ABNORMAL LOW (ref >90)
Glucose, Bld: 101 mg/dL — ABNORMAL HIGH (ref 70–99)
Potassium: 3.2 mEq/L — ABNORMAL LOW (ref 3.5–5.1)
Sodium: 136 mEq/L (ref 135–145)
Total Bilirubin: 1 mg/dL (ref 0.3–1.2)
Total Protein: 7.1 g/dL (ref 6.0–8.3)

## 2013-06-09 LAB — URINE MICROSCOPIC-ADD ON

## 2013-06-09 LAB — URINALYSIS, ROUTINE W REFLEX MICROSCOPIC
Glucose, UA: NEGATIVE mg/dL
Ketones, ur: 15 mg/dL — AB
Nitrite: NEGATIVE
Specific Gravity, Urine: 1.02 (ref 1.005–1.030)
pH: 5.5 (ref 5.0–8.0)

## 2013-06-09 LAB — CBC
HCT: 37.8 % (ref 36.0–46.0)
Hemoglobin: 12.3 g/dL (ref 12.0–15.0)
RDW: 14.4 % (ref 11.5–15.5)
WBC: 11.4 10*3/uL — ABNORMAL HIGH (ref 4.0–10.5)

## 2013-06-09 LAB — LACTIC ACID, PLASMA: Lactic Acid, Venous: 1.1 mmol/L (ref 0.5–2.2)

## 2013-06-09 MED ORDER — HYDROCODONE-ACETAMINOPHEN 5-325 MG PO TABS
1.0000 | ORAL_TABLET | ORAL | Status: DC | PRN
  Administered 2013-06-10 – 2013-06-13 (×5): 1 via ORAL
  Filled 2013-06-09 (×3): qty 1
  Filled 2013-06-09: qty 2
  Filled 2013-06-09: qty 1

## 2013-06-09 MED ORDER — ATENOLOL 25 MG PO TABS
25.0000 mg | ORAL_TABLET | Freq: Two times a day (BID) | ORAL | Status: DC
  Administered 2013-06-09 – 2013-06-14 (×10): 25 mg via ORAL
  Filled 2013-06-09 (×11): qty 1

## 2013-06-09 MED ORDER — ACETAMINOPHEN 650 MG RE SUPP: 650.0000 mg | Freq: Four times a day (QID) | RECTAL | Status: DC | PRN

## 2013-06-09 MED ORDER — ALLOPURINOL 300 MG PO TABS
450.0000 mg | ORAL_TABLET | Freq: Every day | ORAL | Status: DC
  Administered 2013-06-09 – 2013-06-13 (×5): 450 mg via ORAL
  Filled 2013-06-09 (×6): qty 1

## 2013-06-09 MED ORDER — SODIUM CHLORIDE 0.9 % IV BOLUS (SEPSIS)
75.0000 mL | Freq: Once | INTRAVENOUS | Status: AC
  Administered 2013-06-09: 75 mL via INTRAVENOUS

## 2013-06-09 MED ORDER — ALBUTEROL SULFATE HFA 108 (90 BASE) MCG/ACT IN AERS
2.0000 | INHALATION_SPRAY | Freq: Three times a day (TID) | RESPIRATORY_TRACT | Status: DC
  Administered 2013-06-10 – 2013-06-12 (×9): 2 via RESPIRATORY_TRACT
  Filled 2013-06-09: qty 6.7

## 2013-06-09 MED ORDER — AMLODIPINE BESYLATE 10 MG PO TABS
10.0000 mg | ORAL_TABLET | Freq: Every day | ORAL | Status: DC
  Administered 2013-06-09: 21:00:00 10 mg via ORAL
  Filled 2013-06-09 (×2): qty 1

## 2013-06-09 MED ORDER — DEXTROSE 5 % IV SOLN
1.0000 g | INTRAVENOUS | Status: DC
  Administered 2013-06-10 – 2013-06-13 (×4): 1 g via INTRAVENOUS
  Filled 2013-06-09 (×5): qty 10

## 2013-06-09 MED ORDER — DEXTROSE 5 % IV SOLN
1.0000 g | Freq: Once | INTRAVENOUS | Status: DC
  Administered 2013-06-09: 1 g via INTRAVENOUS
  Filled 2013-06-09: qty 10

## 2013-06-09 MED ORDER — LOSARTAN POTASSIUM 50 MG PO TABS
100.0000 mg | ORAL_TABLET | Freq: Every day | ORAL | Status: DC
  Administered 2013-06-10 – 2013-06-14 (×5): 100 mg via ORAL
  Filled 2013-06-09 (×5): qty 2

## 2013-06-09 MED ORDER — GUAIFENESIN-DM 100-10 MG/5ML PO SYRP
5.0000 mL | ORAL_SOLUTION | ORAL | Status: DC | PRN
  Administered 2013-06-11 – 2013-06-14 (×2): 5 mL via ORAL
  Filled 2013-06-09 (×2): qty 5

## 2013-06-09 MED ORDER — ONDANSETRON HCL 4 MG PO TABS: 4.0000 mg | ORAL_TABLET | Freq: Four times a day (QID) | ORAL | Status: DC | PRN

## 2013-06-09 MED ORDER — ALBUTEROL SULFATE (5 MG/ML) 0.5% IN NEBU
2.5000 mg | INHALATION_SOLUTION | RESPIRATORY_TRACT | Status: DC | PRN
  Administered 2013-06-14: 2.5 mg via RESPIRATORY_TRACT
  Filled 2013-06-09: qty 0.5

## 2013-06-09 MED ORDER — ACETAMINOPHEN 325 MG PO TABS
650.0000 mg | ORAL_TABLET | Freq: Four times a day (QID) | ORAL | Status: DC | PRN
  Administered 2013-06-10 – 2013-06-14 (×2): 650 mg via ORAL
  Filled 2013-06-09 (×3): qty 2

## 2013-06-09 MED ORDER — HEPARIN SODIUM (PORCINE) 5000 UNIT/ML IJ SOLN
5000.0000 [IU] | Freq: Three times a day (TID) | INTRAMUSCULAR | Status: DC
  Administered 2013-06-09 – 2013-06-14 (×15): 5000 [IU] via SUBCUTANEOUS
  Filled 2013-06-09 (×17): qty 1

## 2013-06-09 MED ORDER — ASPIRIN EC 81 MG PO TBEC
81.0000 mg | DELAYED_RELEASE_TABLET | Freq: Every day | ORAL | Status: DC
  Administered 2013-06-09 – 2013-06-14 (×6): 81 mg via ORAL
  Filled 2013-06-09 (×6): qty 1

## 2013-06-09 MED ORDER — HYDRALAZINE HCL 20 MG/ML IJ SOLN
10.0000 mg | Freq: Four times a day (QID) | INTRAMUSCULAR | Status: DC | PRN
  Administered 2013-06-13: 06:00:00 10 mg via INTRAVENOUS
  Filled 2013-06-09 (×2): qty 1

## 2013-06-09 MED ORDER — CHLORHEXIDINE GLUCONATE 0.12 % MT SOLN
15.0000 mL | Freq: Two times a day (BID) | OROMUCOSAL | Status: DC
  Administered 2013-06-09 – 2013-06-13 (×9): 15 mL via OROMUCOSAL
  Filled 2013-06-09 (×12): qty 15

## 2013-06-09 MED ORDER — ATORVASTATIN CALCIUM 20 MG PO TABS
20.0000 mg | ORAL_TABLET | Freq: Every day | ORAL | Status: DC
  Administered 2013-06-09 – 2013-06-13 (×5): 20 mg via ORAL
  Filled 2013-06-09 (×6): qty 1

## 2013-06-09 MED ORDER — SIMVASTATIN 40 MG PO TABS: 40.0000 mg | ORAL_TABLET | Freq: Every day | ORAL | Status: DC

## 2013-06-09 MED ORDER — POLYETHYLENE GLYCOL 3350 17 G PO PACK
17.0000 g | PACK | Freq: Every day | ORAL | Status: DC | PRN
  Filled 2013-06-09: qty 1

## 2013-06-09 MED ORDER — INFLUENZA VAC SPLIT QUAD 0.5 ML IM SUSP
0.5000 mL | INTRAMUSCULAR | Status: AC
  Administered 2013-06-10: 11:00:00 0.5 mL via INTRAMUSCULAR
  Filled 2013-06-09: qty 0.5

## 2013-06-09 MED ORDER — ONDANSETRON HCL 4 MG/2ML IJ SOLN: 4.0000 mg | Freq: Four times a day (QID) | INTRAMUSCULAR | Status: DC | PRN

## 2013-06-09 MED ORDER — DEXTROSE 5 % IV SOLN: 500.0000 mg | INTRAVENOUS | Status: DC

## 2013-06-09 MED ORDER — ALUM & MAG HYDROXIDE-SIMETH 200-200-20 MG/5ML PO SUSP
30.0000 mL | Freq: Four times a day (QID) | ORAL | Status: DC | PRN
  Administered 2013-06-12 – 2013-06-13 (×2): 30 mL via ORAL
  Filled 2013-06-09 (×2): qty 30

## 2013-06-09 MED ORDER — SODIUM CHLORIDE 0.9 % IV BOLUS (SEPSIS)
500.0000 mL | Freq: Once | INTRAVENOUS | Status: AC
  Administered 2013-06-09: 500 mL via INTRAVENOUS

## 2013-06-09 MED ORDER — ALBUTEROL SULFATE HFA 108 (90 BASE) MCG/ACT IN AERS: 2.0000 | INHALATION_SPRAY | Freq: Four times a day (QID) | RESPIRATORY_TRACT | Status: DC

## 2013-06-09 MED ORDER — AZITHROMYCIN 500 MG IV SOLR: 500.0000 mg | Freq: Once | INTRAVENOUS | Status: DC

## 2013-06-09 MED ORDER — SODIUM CHLORIDE 0.9 % IJ SOLN
3.0000 mL | Freq: Two times a day (BID) | INTRAMUSCULAR | Status: DC
  Administered 2013-06-09 – 2013-06-13 (×5): 3 mL via INTRAVENOUS

## 2013-06-09 MED ORDER — MORPHINE SULFATE 2 MG/ML IJ SOLN: 1.0000 mg | INTRAMUSCULAR | Status: DC | PRN

## 2013-06-09 MED ORDER — DEXTROSE 5 % IV SOLN
500.0000 mg | INTRAVENOUS | Status: DC
  Administered 2013-06-09 – 2013-06-11 (×3): 500 mg via INTRAVENOUS
  Filled 2013-06-09 (×4): qty 500

## 2013-06-09 MED ORDER — SODIUM CHLORIDE 0.9 % IV BOLUS (SEPSIS): 1000.0000 mL | Freq: Once | INTRAVENOUS | Status: DC

## 2013-06-09 MED ORDER — BIOTENE DRY MOUTH MT LIQD
15.0000 mL | Freq: Two times a day (BID) | OROMUCOSAL | Status: DC
  Administered 2013-06-10 – 2013-06-13 (×5): 15 mL via OROMUCOSAL

## 2013-06-09 NOTE — ED Notes (Addendum)
C/o SOB, cough x 2 weeks became prod 1 week ago. Breath sounds clear upper airways with crackles in bases L>R. No pedal edema noted. Presently denies SOB. Resp e/u, no distress. Denies CP Reports chills started yesterday. States this is pneumonia, "because I've had it before".

## 2013-06-09 NOTE — ED Notes (Signed)
C/o SOB, prod cough x 3 days.

## 2013-06-09 NOTE — ED Notes (Signed)
Admitting MD at bedside.

## 2013-06-09 NOTE — H&P (Signed)
[facility]                                                                                    Patient Demographics  [name], is a 79 y.o. female  MRN: [id number]   DOB - 1923-02-25  Admit Date - 06/09/2013  Outpatient Primary MD for the patient is Pearson Grippe, MD   With History of -  Past Medical History  Diagnosis Date  . Hypertension   . Arthritis   . Gout       Past Surgical History  Procedure Laterality Date  . Appendectomy      in for   Chief Complaint  Patient presents with  . Shortness of Breath  . Cough     HPI  [name]  is a 79 y.o. female, history of bronchitis, quit smoking more than 7 years ago, hypertension, gout, arthritis whose in fairly good health for her age comes in to the hospital with about one week history of worsening cough, she says she has a baseline cough but it is significantly worse, she also started experiencing fevers and chills since yesterday, some exertional shortness of breath, denies any travel, no chest pain or palpitations, no abdominal pain no diarrhea, no blood in stool or urine, no focal weakness. Came to the ER where workup is consistent with early pneumonia versus acute bronchitis and I was called to admit the patient.    Review of Systems    In addition to the HPI above,  Positive Fever-chills, No Headache, No changes with Vision or hearing, No problems swallowing food or Liquids, No Chest pain, positive dry Cough and mild exertional Shortness of Breath, No Abdominal pain, No Nausea or Vommitting, Bowel movements are regular, No Blood in stool or Urine, No dysuria, No new skin rashes or bruises, No new joints pains-aches,  No new weakness, tingling, numbness in any extremity, No recent weight gain or loss, No polyuria, polydypsia or polyphagia, No significant Mental Stressors.  A full 10 point Review of Systems was done, except as stated above, all other Review of Systems were negative.   Social  History History  Substance Use Topics  . Smoking status: Former Games developer  . Smokeless tobacco: Not on file  . Alcohol Use: No      Family History Family History  Problem Relation Age of Onset  . Cancer Mother   . Stroke Father       Prior to Admission medications   Medication Sig Start Date End Date Taking? Authorizing Provider  acetaminophen-codeine (TYLENOL #3) 300-30 MG per tablet Take 1 tablet by mouth every 6 (six) hours as needed (gout pain).    Yes Historical Provider, MD  albuterol (PROVENTIL HFA;VENTOLIN HFA) 108 (90 BASE) MCG/ACT inhaler Inhale 2 puffs into the lungs every 6 (six) hours as needed for wheezing or shortness of breath.   Yes Historical Provider, MD  allopurinol (ZYLOPRIM) 300 MG tablet Take 450 mg by mouth at bedtime.    Yes Historical Provider, MD  amLODipine (NORVASC) 5 MG tablet Take 5 mg by mouth daily.   Yes Historical Provider, MD  Aspirin-Acetaminophen-Caffeine (GOODY HEADACHE PO) Take 1 packet by  mouth 3 (three) times daily as needed (pain).    Yes Historical Provider, MD  atenolol (TENORMIN) 25 MG tablet Take 25 mg by mouth 2 (two) times daily.   Yes Historical Provider, MD  hydrochlorothiazide (MICROZIDE) 12.5 MG capsule Take 12.5 mg by mouth daily.   Yes Historical Provider, MD  oxyCODONE-acetaminophen (PERCOCET/ROXICET) 5-325 MG per tablet Take 1 tablet by mouth 2 (two) times daily. 12/06/12  Yes Clydia Llano, MD  simvastatin (ZOCOR) 40 MG tablet Take 40 mg by mouth at bedtime.    Yes Historical Provider, MD  losartan (COZAAR) 100 MG tablet Take 100 mg by mouth daily.    Historical Provider, MD    Allergies  Allergen Reactions  . Penicillins Rash    Physical Exam  Vitals  Blood pressure 178/82, pulse 83, temperature 102.3 F (39.1 C), temperature source Rectal, resp. rate 24, height 5\' 5"  (1.651 m), weight 45.813 kg (101 lb), SpO2 96.00%.   1. General frail elderly African American female lying in bed in NAD,     2. Normal affect and  insight, Not Suicidal or Homicidal, Awake Alert, Oriented X 3.  3. No F.N deficits, ALL C.Nerves Intact, Strength 5/5 all 4 extremities, Sensation intact all 4 extremities, Plantars down going.  4. Ears and Eyes appear Normal, Conjunctivae clear, PERRLA. Moist Oral Mucosa.  5. Supple Neck, No JVD, No cervical lymphadenopathy appriciated, No Carotid Bruits.  6. Symmetrical Chest wall movement, Good air movement bilaterally, coarse bilateral breath sounds  7. RRR, No Gallops, Rubs or Murmurs, No Parasternal Heave.  8. Positive Bowel Sounds, Abdomen Soft, Non tender, No organomegaly appriciated,No rebound -guarding or rigidity.  9.  No Cyanosis, Normal Skin Turgor, No Skin Rash or Bruise.  10. Good muscle tone,  joints appear normal , no effusions, Normal ROM.  11. No Palpable Lymph Nodes in Neck or Axillae     Data Review  CBC  Recent Labs Lab 06/09/13 1543  WBC 11.4*  HGB 12.3  HCT 37.8  PLT 218  MCV 90.9  MCH 29.6  MCHC 32.5  RDW 14.4   ------------------------------------------------------------------------------------------------------------------  Chemistries   Recent Labs Lab 06/09/13 1543  NA 136  K 3.2*  CL 97  CO2 27  GLUCOSE 101*  BUN 19  CREATININE 0.95  CALCIUM 9.9  AST 18  ALT 10  ALKPHOS 74  BILITOT 1.0   ------------------------------------------------------------------------------------------------------------------ estimated creatinine clearance is 29.6 ml/min (by C-G formula based on Cr of 0.95). ------------------------------------------------------------------------------------------------------------------ No results found for this basename: TSH, T4TOTAL, FREET3, T3FREE, THYROIDAB,  in the last 72 hours   Coagulation profile No results found for this basename: INR, PROTIME,  in the last 168 hours ------------------------------------------------------------------------------------------------------------------- No results found for  this basename: DDIMER,  in the last 72 hours -------------------------------------------------------------------------------------------------------------------  Cardiac Enzymes No results found for this basename: CK, CKMB, TROPONINI, MYOGLOBIN,  in the last 168 hours ------------------------------------------------------------------------------------------------------------------ No components found with this basename: POCBNP,    ---------------------------------------------------------------------------------------------------------------  Urinalysis    Component Value Date/Time   COLORURINE YELLOW 12/02/2012 2036   APPEARANCEUR CLEAR 12/02/2012 2036   LABSPEC 1.018 12/02/2012 2036   PHURINE 5.5 12/02/2012 2036   GLUCOSEU NEGATIVE 12/02/2012 2036   HGBUR TRACE* 12/02/2012 2036   BILIRUBINUR SMALL* 12/02/2012 2036   KETONESUR NEGATIVE 12/02/2012 2036   PROTEINUR NEGATIVE 12/02/2012 2036   UROBILINOGEN 1.0 12/02/2012 2036   NITRITE NEGATIVE 12/02/2012 2036   LEUKOCYTESUR TRACE* 12/02/2012 2036    ----------------------------------------------------------------------------------------------------------------  Imaging results:   Dg Chest 2 View (if Patient  Has Fever And/or Copd)  06/09/2013   CLINICAL DATA:  Shortness of breath And cough  EXAM: CHEST  2 VIEW  COMPARISON:  12/02/2012  FINDINGS: The cardiac shadow is stable. Previously seen infiltrates have resolved in the interval. Diffuse chronic fibrotic changes are noted. No sizable effusion or focal infiltrate is seen. No acute bony abnormality is noted.  IMPRESSION: Chronic changes without acute abnormality.   Electronically Signed   By: Alcide Clever M.D.   On: 06/09/2013 16:49    My personal review of EKG: Rhythm NSR, T wave inversions in lateral leads which are similar to previous EKG changes    Assessment & Plan    1. Early community acquired pneumonia versus acute bronchitis. Admit the patient to telemetry bed, sputum and blood  cultures, IV Rocephin and azithromycin, nebulizer treatments and oxygen as needed. Will add chest PT and pulmonary toilet toilet.    2. Hypertension. Continue home medications which include beta blocker, ARB, Norvasc dose will be increased for better control.    3. Gout on allopurinol which will be continued no acute issues.     DVT Prophylaxis Heparin    AM Labs Ordered, also please review Full Orders  Family Communication: Admission, patients condition and plan of care including tests being ordered have been discussed with the patient who indicates understanding and agree with the plan and Code Status.  Code Status full  Likely DC to  Home  Condition GUARDED   Time spent in minutes : 35    Vertie Dibbern K M.D on 06/09/2013 at 5:33 PM  Between 7am to 7pm - Pager - [phone]  After 7pm go to [email or web address] - password [email or web address]  And look for the night coverage person covering me after hours  [facility] Office  [phone]

## 2013-06-09 NOTE — ED Provider Notes (Signed)
CSN: [id number]     Arrival date & time 06/09/13  1520 History   First MD Initiated Contact with Patient 06/09/13 1521     Chief Complaint  Patient presents with  . Shortness of Breath  . Cough   (Consider location/radiation/quality/duration/timing/severity/associated sxs/prior Treatment) HPI Onset was three days ago, gradual, constant, worsening.  The pain is mild. Modifying factors: pain worse with cough.  Associated symptoms: chills last night.  Recent medical care: none. Pt reports having pneumonia last year and this feels the same.    Past Medical History  Diagnosis Date  . Hypertension   . Arthritis   . Gout    Past Surgical History  Procedure Laterality Date  . Appendectomy     Family History  Problem Relation Age of Onset  . Cancer Mother   . Stroke Father    History  Substance Use Topics  . Smoking status: Former Games developer  . Smokeless tobacco: Not on file  . Alcohol Use: No   OB History   Grav Para Term Preterm Abortions TAB SAB Ect Mult Living                 Review of Systems Constitutional: Negative for fever.  Eyes: Negative for vision loss.  ENT: Negative for difficulty swallowing.  Cardiovascular: Negative for leg swelling. Respiratory: Negative for respiratory distress.  Gastrointestinal:  Negative for vomiting.  Genitourinary: Negative for inability to void.  Musculoskeletal: Negative for gait problem.  Integumentary: Negative for rash.  Neurological: Negative for new focal weakness.     Allergies  Penicillins  Home Medications   Current Outpatient Rx  Name  Route  Sig  Dispense  Refill  . acetaminophen-codeine (TYLENOL #3) 300-30 MG per tablet   Oral   Take 1 tablet by mouth every 6 (six) hours as needed for pain.         Marland Kitchen allopurinol (ZYLOPRIM) 300 MG tablet   Oral   Take 150 mg by mouth 2 (two) times daily.         Marland Kitchen amLODipine (NORVASC) 5 MG tablet   Oral   Take 5 mg by mouth daily.         Marland Kitchen aspirin EC 81 MG tablet    Oral   Take 81 mg by mouth daily.         . Aspirin-Acetaminophen-Caffeine (GOODY HEADACHE PO)   Oral   Take 1 packet by mouth 2 (two) times daily as needed.         Marland Kitchen atenolol (TENORMIN) 25 MG tablet   Oral   Take 25 mg by mouth 2 (two) times daily.         . hydrochlorothiazide (MICROZIDE) 12.5 MG capsule   Oral   Take 12.5 mg by mouth daily.         Marland Kitchen levofloxacin (LEVAQUIN) 750 MG tablet   Oral   Take 1 tablet (750 mg total) by mouth daily.   10 tablet   0   . losartan (COZAAR) 100 MG tablet   Oral   Take 100 mg by mouth daily.         Marland Kitchen oxyCODONE-acetaminophen (PERCOCET/ROXICET) 5-325 MG per tablet   Oral   Take 1 tablet by mouth 2 (two) times daily.   30 tablet   0   . simvastatin (ZOCOR) 40 MG tablet   Oral   Take 40 mg by mouth every evening.          BP 163/83  Pulse 88  Temp(Src) 102.3 F (39.1 C) (Rectal)  Resp 22  Ht 5\' 5"  (1.651 m)  Wt 101 lb (45.813 kg)  BMI 16.81 kg/m2  SpO2 94% Physical Exam Nursing note and vitals reviewed.  Constitutional: Pt is alert and appears stated age. Eyes: No injection, no scleral icterus. HENT: Atraumatic, airway open without erythema or exudate.  Respiratory: No respiratory distress. Equal breathing bilaterally. Rales in bilateral bases. No wheezing.  Cardiovascular: Normal rate. Extremities warm and well perfused.  Abdomen: Soft, non-tender. MSK: Extremities are atraumatic without deformity. Skin: No rash, no wounds.   Neuro: No motor nor sensory deficit.     ED Course  Procedures (including critical care time) Labs Review Labs Reviewed  CBC - Abnormal; Notable for the following:    WBC 11.4 (*)    All other components within normal limits  COMPREHENSIVE METABOLIC PANEL - Abnormal; Notable for the following:    Potassium 3.2 (*)    Glucose, Bld 101 (*)    GFR calc non Af Amer 52 (*)    GFR calc Af Amer 60 (*)    All other components within normal limits  CULTURE, BLOOD (ROUTINE X 2)   CULTURE, BLOOD (ROUTINE X 2)  URINE CULTURE  LACTIC ACID, PLASMA  URINALYSIS, ROUTINE W REFLEX MICROSCOPIC  POCT I-STAT TROPONIN I   Imaging Review Dg Chest 2 View (if Patient Has Fever And/or Copd)  06/09/2013   CLINICAL DATA:  Shortness of breath And cough  EXAM: CHEST  2 VIEW  COMPARISON:  12/02/2012  FINDINGS: The cardiac shadow is stable. Previously seen infiltrates have resolved in the interval. Diffuse chronic fibrotic changes are noted. No sizable effusion or focal infiltrate is seen. No acute bony abnormality is noted.  IMPRESSION: Chronic changes without acute abnormality.   Electronically Signed   By: Alcide Clever M.D.   On: 06/09/2013 16:49    EKG Interpretation     Ventricular Rate:  94 PR Interval:  156 QRS Duration: 77 QT Interval:  362 QTC Calculation: 453 R Axis:   9 Text Interpretation:  Sinus rhythm Probable left atrial enlargement LVH with secondary repolarization abnormality Anterior Q waves, possibly due to LVH Flipped T waves laterally, inferiorly, similar to prior            MDM   1. CAP (community acquired pneumonia)   2. Gouty arthritis   3. HTN (hypertension)   4. Leukocytopenia, unspecified   5. SOB (shortness of breath)   6. Fever   7. Shortness of breath    79 y.o. female w/ PMHx of HTN presents w/ SOB, cough, fever, concerned for PNA. Not c/w ACS or PE. Pt without signs of severe sepsis. Due to concern for PNA IV rocephin, IV azithro given after blood cx drawn. IVF given.    EKG without ischemic changes. Troponin neg. CBC with leukocytosis. Lactic acid low. CMP without renal or kidney failure. K slightly low. CXR without new consolidation. UA pending.   Called hospitalist for admission. Pt stable on re-eval.    I independently viewed, interpreted, and used in my medical decision making all ordered lab and imaging tests. Medical Decision Making discussed with ED attending Dagmar Hait, MD     Charm Barges, MD 06/09/13  1726

## 2013-06-09 NOTE — ED Provider Notes (Signed)
I saw and evaluated the patient, reviewed the resident's note and I agree with the findings and plan.  EKG Interpretation     Ventricular Rate:  94 PR Interval:  156 QRS Duration: 77 QT Interval:  362 QTC Calculation: 453 R Axis:   9 Text Interpretation:  Sinus rhythm Probable left atrial enlargement LVH with secondary repolarization abnormality Anterior Q waves, possibly due to LVH Flipped T waves laterally, inferiorly, similar to prior            79 year old female here with fatigue, shortness of breath. She recent had pneumonia 6 months ago and felt like this previously. Denies fever. Denies chest pain. Here febrile rectally, normotensive, normal sinus rhythm on the monitor, normal O2 waveform and 92% on room air. Patient has diffuse rhonchi in her lungs. She is in no acute respiratory distress. Labs show mild white count. She was given community acquired antibiotics for pneumonia based on clinical findings, however her chest x-ray did not reveal pneumonia. Cultures were drawn prior to this. Patient admitted to medicine.  Dagmar Hait, MD 06/09/13 Paulo Fruit

## 2013-06-09 NOTE — Progress Notes (Signed)
ANTIBIOTIC CONSULT NOTE - INITIAL  Pharmacy Consult for azithromycin/ceftriaxone Indication: CAP  Allergies  Allergen Reactions  . Penicillins Rash    Patient Measurements: Height: 5\' 5"  (165.1 cm) Weight: 101 lb (45.813 kg) IBW/kg (Calculated) : 57  Vital Signs: Temp: 101.7 F (38.7 C) (10/23 1815) Temp src: Oral (10/23 1815) BP: 168/76 mmHg (10/23 1815) Pulse Rate: 88 (10/23 1815) Intake/Output from previous day:   Intake/Output from this shift: Total I/O In: 500 [I.V.:500] Out: -   Labs:  Recent Labs  06/09/13 1543  WBC 11.4*  HGB 12.3  PLT 218  CREATININE 0.95   Estimated Creatinine Clearance: 29.6 ml/min (by C-G formula based on Cr of 0.95). No results found for this basename: VANCOTROUGH, VANCOPEAK, VANCORANDOM, GENTTROUGH, GENTPEAK, GENTRANDOM, TOBRATROUGH, TOBRAPEAK, TOBRARND, AMIKACINPEAK, AMIKACINTROU, AMIKACIN,  in the last 72 hours   Microbiology: No results found for this or any previous visit (from the past 720 hour(s)).  Medical History: Past Medical History  Diagnosis Date  . Hypertension   . Arthritis   . Gout     Assessment: 79 yo female here with worsened cough, fever and chills to start azithromycin 500mg  IV q24hr and rocephin 1gm IV q24hr for CAP.   abx 10/23 rocephin 10/23 azithromycin  Cx 10/23 resp  Plan:  -No antibiotic dose adjustments needed -Will follow cultures and clinical progress  Harland German, Pharm D 06/09/2013 6:54 PM

## 2013-06-09 NOTE — ED Notes (Signed)
Patient transported to X-ray 

## 2013-06-10 DIAGNOSIS — R509 Fever, unspecified: Secondary | ICD-10-CM

## 2013-06-10 LAB — URINE CULTURE
Colony Count: 70000
Special Requests: NORMAL

## 2013-06-10 LAB — MAGNESIUM: Magnesium: 1.7 mg/dL (ref 1.5–2.5)

## 2013-06-10 LAB — BASIC METABOLIC PANEL
CO2: 26 mEq/L (ref 19–32)
Calcium: 8.9 mg/dL (ref 8.4–10.5)
Creatinine, Ser: 0.97 mg/dL (ref 0.50–1.10)
GFR calc Af Amer: 59 mL/min — ABNORMAL LOW (ref >90)
GFR calc non Af Amer: 51 mL/min — ABNORMAL LOW (ref >90)
Sodium: 135 mEq/L (ref 135–145)

## 2013-06-10 LAB — CBC
HCT: 32.5 % — ABNORMAL LOW (ref 36.0–46.0)
MCV: 90.8 fL (ref 78.0–100.0)
Platelets: 210 10*3/uL (ref 150–400)
RDW: 14.2 % (ref 11.5–15.5)
WBC: 12 10*3/uL — ABNORMAL HIGH (ref 4.0–10.5)

## 2013-06-10 LAB — EXPECTORATED SPUTUM ASSESSMENT W GRAM STAIN, RFLX TO RESP C

## 2013-06-10 MED ORDER — BOOST / RESOURCE BREEZE PO LIQD
1.0000 | Freq: Two times a day (BID) | ORAL | Status: DC
  Administered 2013-06-10 – 2013-06-14 (×9): 1 via ORAL

## 2013-06-10 MED ORDER — POTASSIUM CHLORIDE CRYS ER 10 MEQ PO TBCR
30.0000 meq | EXTENDED_RELEASE_TABLET | ORAL | Status: DC
  Administered 2013-06-10: 30 meq via ORAL
  Filled 2013-06-10 (×2): qty 1

## 2013-06-10 MED ORDER — POTASSIUM CHLORIDE CRYS ER 20 MEQ PO TBCR
40.0000 meq | EXTENDED_RELEASE_TABLET | Freq: Two times a day (BID) | ORAL | Status: DC
  Administered 2013-06-10 – 2013-06-14 (×9): 40 meq via ORAL
  Filled 2013-06-10 (×9): qty 2

## 2013-06-10 MED ORDER — MAGNESIUM OXIDE 400 (241.3 MG) MG PO TABS
800.0000 mg | ORAL_TABLET | Freq: Once | ORAL | Status: AC
  Administered 2013-06-10: 800 mg via ORAL
  Filled 2013-06-10 (×2): qty 2

## 2013-06-10 MED ORDER — AMLODIPINE BESYLATE 5 MG PO TABS
5.0000 mg | ORAL_TABLET | Freq: Every day | ORAL | Status: DC
  Filled 2013-06-10: qty 1

## 2013-06-10 NOTE — Progress Notes (Signed)
NURSING PROGRESS NOTE  DIERRA RIESGO [id number] Admission Data: 06/10/2013 6:12 AM Attending Provider: Maretta Bees, MD PCP:KIM, Fayrene Fearing, MD Code Status: full code  [name] is a 79 y.o. female patient admitted from ED:  -No acute distress noted.  -No complaints of shortness of breath.  -No complaints of chest pain.   Cardiac Monitoring: Box # [id number] in place. Cardiac monitor yields:normal sinus rhythm.  Blood pressure 100/60, pulse 61, temperature 98.5 F (36.9 C), temperature source Oral, resp. rate 10, height 5\' 5"  (1.651 m), weight 46.1 kg (101 lb 10.1 oz), SpO2 94.00%.   IV Fluids:  IV in place, occlusive dsg intact without redness, IV cath forearm right, condition patent and no redness normal saline.   Allergies:  Penicillins  Past Medical History:   has a past medical history of Hypertension; Arthritis; and Gout.  Past Surgical History:   has past surgical history that includes Appendectomy.  Social History:   reports that she has quit smoking. She does not have any smokeless tobacco history on file. She reports that she does not drink alcohol or use illicit drugs.  Skin: intact   Patient/Family orientated to room. Information packet given to patient/family. Admission inpatient armband information verified with patient/family to include name and date of birth and placed on patient arm. Side rails up x 2, fall assessment and education completed with patient/family. Patient/family able to verbalize understanding of risk associated with falls and verbalized understanding to call for assistance before getting out of bed. Call light within reach. Patient/family able to voice and demonstrate understanding of unit orientation instructions.    Will continue to evaluate and treat per MD orders.

## 2013-06-10 NOTE — Progress Notes (Signed)
[facility]                                                                                Patient Demographics  [name], is a 79 y.o. female, DOB - [birth year], ZOX:[id number]  Admit date - 06/09/2013   Admitting Physician Leroy Sea, MD  Outpatient Primary MD for the patient is Pearson Grippe, MD  LOS - 1   Chief Complaint  Patient presents with  . Shortness of Breath  . Cough        Assessment & Plan    1. Early community acquired pneumonia versus acute bronchitis. Stable on telemetry bed will dc tele, follow sputum and blood cultures, continue IV Rocephin and azithromycin, nebulizer treatments and oxygen as needed. Will add chest PT and pulmonary toilet toilet.      2. Hypertension. Continue home medications which include beta blocker, ARB, Norvasc with parameters.     3. Gout on allopurinol which will be continued no acute issues.     4.Hypokalemia - replaced, recheck with Mag in the am.     Code Status: Full  Family Communication:    Disposition Plan: Home   Procedures     Consults      Medications  Scheduled Meds: . albuterol  2 puff Inhalation TID  . allopurinol  450 mg Oral QHS  . amLODipine  10 mg Oral Daily  . antiseptic oral rinse  15 mL Mouth Rinse q12n4p  . aspirin EC  81 mg Oral Daily  . atenolol  25 mg Oral BID  . atorvastatin  20 mg Oral QHS  . azithromycin (ZITHROMAX) 500 MG IVPB  500 mg Intravenous Q24H  . cefTRIAXone (ROCEPHIN)  IV  1 g Intravenous Q24H  . chlorhexidine  15 mL Mouth Rinse BID  . heparin  5,000 Units Subcutaneous Q8H  . influenza vac split quadrivalent PF  0.5 mL Intramuscular Tomorrow-1000  . losartan  100 mg Oral Daily  . magnesium oxide  800 mg Oral Once  . potassium chloride  40 mEq Oral BID  . sodium chloride  75 mL Intravenous Once  . sodium chloride  3 mL Intravenous Q12H   Continuous Infusions:  PRN Meds:.acetaminophen, acetaminophen, albuterol, alum & mag hydroxide-simeth,  guaiFENesin-dextromethorphan, hydrALAZINE, HYDROcodone-acetaminophen, morphine injection, ondansetron (ZOFRAN) IV, ondansetron, polyethylene glycol  DVT Prophylaxis  Heparin    Lab Results  Component Value Date   PLT 210 06/10/2013    Antibiotics     Anti-infectives   Start     Dose/Rate Route Frequency Ordered Stop   06/10/13 1815  cefTRIAXone (ROCEPHIN) 1 g in dextrose 5 % 50 mL IVPB     1 g 100 mL/hr over 30 Minutes Intravenous Every 24 hours 06/09/13 1857     06/10/13 1630  azithromycin (ZITHROMAX) 500 mg in dextrose 5 % 250 mL IVPB  Status:  Discontinued     500 mg 250 mL/hr over 60 Minutes Intravenous Every 24 hours 06/09/13 1857 06/09/13 1927   06/09/13 2000  azithromycin (ZITHROMAX) 500 mg in dextrose 5 % 250 mL IVPB     500 mg 250 mL/hr over 60 Minutes Intravenous Every 24 hours 06/09/13 1927  06/09/13 1630  cefTRIAXone (ROCEPHIN) 1 g in dextrose 5 % 50 mL IVPB  Status:  Discontinued     1 g 100 mL/hr over 30 Minutes Intravenous  Once 06/09/13 1622 06/09/13 1843   06/09/13 1630  azithromycin (ZITHROMAX) 500 mg in dextrose 5 % 250 mL IVPB  Status:  Discontinued     500 mg 250 mL/hr over 60 Minutes Intravenous  Once 06/09/13 1622 06/09/13 1857          Subjective:   [name] today has, No headache, No chest pain, No abdominal pain - No Nausea, No new weakness tingling or numbness, improved Cough - SOB.    Objective:   Filed Vitals:   06/10/13 0117 06/10/13 0217 06/10/13 0500 06/10/13 0502  BP:    100/60  Pulse:    61  Temp: 101.2 F (38.4 C) 99.6 F (37.6 C)  98.5 F (36.9 C)  TempSrc: Oral Oral  Oral  Resp:    10  Height:      Weight:   46.1 kg (101 lb 10.1 oz)   SpO2:    94%    Wt Readings from Last 3 Encounters:  06/10/13 46.1 kg (101 lb 10.1 oz)  12/02/12 48.6 kg (107 lb 2.3 oz)     Intake/Output Summary (Last 24 hours) at 06/10/13 0841 Last data filed at 06/10/13 1610  Gross per 24 hour  Intake   1550 ml  Output    150 ml  Net    1400 ml    Exam Awake Alert, Oriented X 3, No new F.N deficits, Normal affect Brethren.AT,PERRAL Supple Neck,No JVD, No cervical lymphadenopathy appriciated.  Symmetrical Chest wall movement, Good air movement bilaterally, Coarse Bilat B sounds RRR,No Gallops,Rubs or new Murmurs, No Parasternal Heave +ve B.Sounds, Abd Soft, Non tender, No organomegaly appriciated, No rebound - guarding or rigidity. No Cyanosis, Clubbing or edema, No new Rash or bruise      Data Review   Micro Results Recent Results (from the past 240 hour(s))  MRSA PCR SCREENING     Status: None   Collection Time    06/10/13  2:02 AM      Result Value Range Status   MRSA by PCR NEGATIVE  NEGATIVE Final   Comment:            The GeneXpert MRSA Assay (FDA     approved for NASAL specimens     only), is one component of a     comprehensive MRSA colonization     surveillance program. It is not     intended to diagnose MRSA     infection nor to guide or     monitor treatment for     MRSA infections.    Radiology Reports Dg Chest 2 View (if Patient Has Fever And/or Copd)  06/09/2013   CLINICAL DATA:  Shortness of breath And cough  EXAM: CHEST  2 VIEW  COMPARISON:  12/02/2012  FINDINGS: The cardiac shadow is stable. Previously seen infiltrates have resolved in the interval. Diffuse chronic fibrotic changes are noted. No sizable effusion or focal infiltrate is seen. No acute bony abnormality is noted.  IMPRESSION: Chronic changes without acute abnormality.   Electronically Signed   By: Alcide Clever M.D.   On: 06/09/2013 16:49    CBC  Recent Labs Lab 06/09/13 1543 06/10/13 0430  WBC 11.4* 12.0*  HGB 12.3 10.5*  HCT 37.8 32.5*  PLT 218 210  MCV 90.9 90.8  MCH 29.6 29.3  MCHC 32.5 32.3  RDW 14.4 14.2    Chemistries   Recent Labs Lab 06/09/13 1543 06/10/13 0430  NA 136 135  K 3.2* 2.8*  CL 97 99  CO2 27 26  GLUCOSE 101* 93  BUN 19 18  CREATININE 0.95 0.97  CALCIUM 9.9 8.9  MG  --  1.7  AST 18  --    ALT 10  --   ALKPHOS 74  --   BILITOT 1.0  --    ------------------------------------------------------------------------------------------------------------------ estimated creatinine clearance is 29.2 ml/min (by C-G formula based on Cr of 0.97). ------------------------------------------------------------------------------------------------------------------ No results found for this basename: HGBA1C,  in the last 72 hours ------------------------------------------------------------------------------------------------------------------ No results found for this basename: CHOL, HDL, LDLCALC, TRIG, CHOLHDL, LDLDIRECT,  in the last 72 hours ------------------------------------------------------------------------------------------------------------------ No results found for this basename: TSH, T4TOTAL, FREET3, T3FREE, THYROIDAB,  in the last 72 hours ------------------------------------------------------------------------------------------------------------------ No results found for this basename: VITAMINB12, FOLATE, FERRITIN, TIBC, IRON, RETICCTPCT,  in the last 72 hours  Coagulation profile No results found for this basename: INR, PROTIME,  in the last 168 hours  No results found for this basename: DDIMER,  in the last 72 hours  Cardiac Enzymes No results found for this basename: CK, CKMB, TROPONINI, MYOGLOBIN,  in the last 168 hours ------------------------------------------------------------------------------------------------------------------ No components found with this basename: POCBNP,      Time Spent in minutes   35   Steffon Gladu K M.D on 06/10/2013 at 8:41 AM  Between 7am to 7pm - Pager - [phone]  After 7pm go to [email or web address] - password [email or web address]  And look for the night coverage person covering for me after hours  [facility] Office  [phone]

## 2013-06-10 NOTE — Progress Notes (Signed)
Utilization Review Completed.[name] T10/24/2014  

## 2013-06-10 NOTE — Progress Notes (Signed)
INITIAL NUTRITION ASSESSMENT  DOCUMENTATION CODES Per approved criteria  -Severe malnutrition in the context of chronic illness -Underweight   INTERVENTION: Add Resource Breeze po BID, each supplement provides 250 kcal and 9 grams of protein. Recommend Regular diet to liberalize PO choices. RD to continue to follow nutrition care plan.  NUTRITION DIAGNOSIS: Inadequate oral intake related to variable appetite as evidenced by pt report and weight loss.   Goal: Intake to meet >90% of estimated nutrition needs.  Monitor:  weight trends, lab trends, I/O's, PO intake, supplement tolerance  Reason for Assessment: Malnutrition Screening Tool  79 y.o. female  Admitting Dx: CAP vs acute bronchitis  ASSESSMENT: PMHx significant for bronchitis, HTN, gout, arthritis. Admitted with SOB and fevers. Work-up reveals CAP vs acute bronchitis.  Pt confirms that she has had ongoing weight loss. States that her usual weight prior to weight loss was 126 lb however she does not remember when she last weighed that amount. Pt is extremely thin with visible fat and muscle wasting. She tells me that her sister told her "the only thing holding your bones together is your skin." Pt does not like Ensure but does like juice, will add Resource Breeze.  Pt states that she wants more salt to she can eat her food, her eggs were untouched because she couldn't salt them.  Nutrition Focused Physical Exam:  Subcutaneous Fat:  Orbital Region: moderate wasting Upper Arm Region: severe wasting Thoracic and Lumbar Region: severe wasting  Muscle:  Temple Region: severe wasting Clavicle Bone Region: severe wasting Clavicle and Acromion Bone Region: severe wasting Scapular Bone Region: severe wasting Dorsal Hand: severe wasting Patellar Region: severe wasting Anterior Thigh Region: severe wasting Posterior Calf Region: severe wasting  Edema: n/a  Pt meets criteria for severe MALNUTRITION in the context of  chronic illness as evidenced by severe muscle and fat mass loss.  Potassium is trending down and presently low at 2.8. Magnesium WNL.   Height: Ht Readings from Last 1 Encounters:  06/09/13 5\' 5"  (1.651 m)    Weight: Wt Readings from Last 1 Encounters:  06/10/13 101 lb 10.1 oz (46.1 kg)    Ideal Body Weight: 125 lb  % Ideal Body Weight: 81%  Wt Readings from Last 10 Encounters:  06/10/13 101 lb 10.1 oz (46.1 kg)  12/02/12 107 lb 2.3 oz (48.6 kg)    Usual Body Weight: 107 lb  % Usual Body Weight: 94%  BMI:  Body mass index is 16.91 kg/(m^2). Underweight  Estimated Nutritional Needs: Kcal: 1400 - 1600 Protein: 55 - 70 g Fluid: 1.4 - 1.6 liters daily  Skin: intact  Diet Order: Cardiac  EDUCATION NEEDS: -No education needs identified at this time   Intake/Output Summary (Last 24 hours) at 06/10/13 0901 Last data filed at 06/10/13 0614  Gross per 24 hour  Intake   1550 ml  Output    150 ml  Net   1400 ml    Last BM: 10/22  Labs:   Recent Labs Lab 06/09/13 1543 06/10/13 0430  NA 136 135  K 3.2* 2.8*  CL 97 99  CO2 27 26  BUN 19 18  CREATININE 0.95 0.97  CALCIUM 9.9 8.9  MG  --  1.7  GLUCOSE 101* 93    CBG (last 3)  No results found for this basename: GLUCAP,  in the last 72 hours  Scheduled Meds: . albuterol  2 puff Inhalation TID  . allopurinol  450 mg Oral QHS  . antiseptic oral rinse  15 mL Mouth Rinse q12n4p  . aspirin EC  81 mg Oral Daily  . atenolol  25 mg Oral BID  . atorvastatin  20 mg Oral QHS  . azithromycin (ZITHROMAX) 500 MG IVPB  500 mg Intravenous Q24H  . cefTRIAXone (ROCEPHIN)  IV  1 g Intravenous Q24H  . chlorhexidine  15 mL Mouth Rinse BID  . heparin  5,000 Units Subcutaneous Q8H  . influenza vac split quadrivalent PF  0.5 mL Intramuscular Tomorrow-1000  . losartan  100 mg Oral Daily  . magnesium oxide  800 mg Oral Once  . potassium chloride  40 mEq Oral BID  . sodium chloride  75 mL Intravenous Once  . sodium  chloride  3 mL Intravenous Q12H    Continuous Infusions:   Past Medical History  Diagnosis Date  . Hypertension   . Arthritis   . Gout     Past Surgical History  Procedure Laterality Date  . Appendectomy      Jarold Motto MS, RD, LDN Pager: [phone] After-hours pager: [phone]

## 2013-06-10 NOTE — Progress Notes (Signed)
   CARE MANAGEMENT NOTE 06/10/2013  Patient:  [name], [name]   Account Number:  [id number]  Date Initiated:  06/10/2013  Documentation initiated by:  [facility]  Subjective/Objective Assessment:     Action/Plan:   lives with nephew, [name] # [phone]   Anticipated DC Date:     Anticipated DC Plan:  SKILLED NURSING FACILITY  In-house referral  Clinical Social Worker      DC Planning Services  CM consult      Choice offered to / List presented to:             Status of service:  In process, will continue to follow Medicare Important Message given?   (If response is "NO", the following Medicare IM given date fields will be blank) Date Medicare IM given:   Date Additional Medicare IM given:    Discharge Disposition:    Per UR Regulation:    If discussed at Long Length of Stay Meetings, dates discussed:    Comments:  06/10/2013 1600 NCM spoke to pt and states she will be willing to go to SNF-rehab for a couple of weeks. Isidoro Donning RN CCM Case Mgmt phone [phone]

## 2013-06-10 NOTE — Progress Notes (Addendum)
Met with Mrs Gazzola at bedside to explain and discuss [facility] Care Management services. States her son Jillyn Hidden) lives with her and her other son lives next door.(Rob). She reports she has a home health agency that comes out to see her. However, she does not know the correct name of the agency. She thought it was [facility]. However, confirmed with [facility] that patient has not been active with them since June. Also patient states she uses transportation services through the church but does not know how to contact them. Son, Jillyn Hidden, who is at bedside has not added much to the conversation in terms of what resources patient is using, such as transportation and home health. Consents were signed however. Explained to patient that [facility] Care Management will not replace any services arranged by inpatient case management or inpatient social work. Left [facility] Care Management packet and contact information. Wil make inpatient RNCM aware of this bedside encounter. She will receive post hospital discharge call and will be evaluated for monthly home visits. She could benefit from PT assessment as well.  Raiford Noble, MSN-Ed, RN,BSN- [facility] Liaison-[phone]

## 2013-06-10 NOTE — Evaluation (Signed)
Physical Therapy Evaluation Patient Details Name: [name] MRN: [id number] DOB: 10/14/1922 Today's Date: 06/10/2013 Time: 9528-4132 PT Time Calculation (min): 12 min  PT Assessment / Plan / Recommendation History of Present Illness  Pt adm with early PNA vs acute bronchitis.  Clinical Impression  Pt admitted with above. Pt currently with functional limitations due to the deficits listed below (see PT Problem List).  Pt will benefit from skilled PT to increase their independence and safety with mobility to allow discharge to least restrictive environment.  Currently feel ST-SNF is best option.  If patient improves rapidly or has increased family support may be able to return home.     PT Assessment  Patient needs continued PT services    Follow Up Recommendations  SNF    Does the patient have the potential to tolerate intense rehabilitation      Barriers to Discharge Decreased caregiver support      Equipment Recommendations  Other (comment) (To be determined.)    Recommendations for Other Services     Frequency Min 3X/week    Precautions / Restrictions Precautions Precautions: Fall   Pertinent Vitals/Pain SaO2 87% on RA after amb.      Mobility  Transfers Transfers: Sit to Stand;Stand to Sit Sit to Stand: 4: Min guard;With upper extremity assist;With armrests;From chair/3-in-1 Stand to Sit: 4: Min guard;With upper extremity assist;With armrests;To chair/3-in-1 Ambulation/Gait Ambulation/Gait Assistance: 4: Min assist Ambulation Distance (Feet): 80 Feet Assistive device: None Ambulation/Gait Assistance Details: Unsteady and assist needed for balance. Gait Pattern: Step-through pattern;Decreased step length - right;Decreased step length - left;Shuffle;Narrow base of support Gait velocity: decr    Exercises     PT Diagnosis: Difficulty walking;Generalized weakness  PT Problem List: Decreased strength;Decreased activity tolerance;Decreased balance;Decreased  mobility PT Treatment Interventions: DME instruction;Gait training;Functional mobility training;Therapeutic activities;Therapeutic exercise;Balance training;Patient/family education     PT Goals(Current goals can be found in the care plan section) Acute Rehab PT Goals Patient Stated Goal: Get stronger. PT Goal Formulation: With patient Time For Goal Achievement: 06/17/13 Potential to Achieve Goals: Good  Visit Information  Last PT Received On: 06/10/13 Assistance Needed: +1 History of Present Illness: Pt adm with early PNA vs acute bronchitis.       Prior Functioning  Home Living Family/patient expects to be discharged to:: Private residence Living Arrangements: Alone Available Help at Discharge: Family;Available PRN/intermittently Type of Home: Apartment Home Layout: One level Prior Function Level of Independence: Independent Dominant Hand: Right    Cognition  Cognition Arousal/Alertness: Awake/alert Behavior During Therapy: WFL for tasks assessed/performed Overall Cognitive Status: Within Functional Limits for tasks assessed    Extremity/Trunk Assessment Upper Extremity Assessment Upper Extremity Assessment: Generalized weakness Lower Extremity Assessment Lower Extremity Assessment: Generalized weakness   Balance Balance Balance Assessed: Yes Static Standing Balance Static Standing - Balance Support: No upper extremity supported Static Standing - Level of Assistance: 4: Min assist  End of Session PT - End of Session Activity Tolerance: Patient limited by fatigue Patient left: in chair;with call bell/phone within reach Nurse Communication: Mobility status;Other (comment) (decr SaO2 with amb.)  GP     [name] 06/10/2013, 10:16 AM  [facility] PT [phone]

## 2013-06-11 LAB — MAGNESIUM: Magnesium: 1.9 mg/dL (ref 1.5–2.5)

## 2013-06-11 MED ORDER — DEXTROSE 5 % IV SOLN: 500.0000 mg | INTRAVENOUS | Status: AC

## 2013-06-11 NOTE — Progress Notes (Signed)
[facility]                                                                                Patient Demographics  [name], is a 79 y.o. female, DOB - 1923-06-03, WUJ:[id number]  Admit date - 06/09/2013   Admitting Physician Leroy Sea, MD  Outpatient Primary MD for the patient is Pearson Grippe, MD  LOS - 2   Chief Complaint  Patient presents with  . Shortness of Breath  . Cough        Assessment & Plan    1. Early community acquired pneumonia versus acute bronchitis. Stable on telemetry bed will dc tele, follow sputum and blood cultures, continue IV Rocephin and azithromycin, nebulizer treatments and oxygen as needed. Will add chest PT and pulmonary toilet toilet. Much improved.     2. Hypertension. Continue home medications which include beta blocker, ARB, Norvasc with parameters.     3. Gout on allopurinol which will be continued no acute issues.     4.Hypokalemia - replaced and stable.     Code Status: Full  Family Communication:    Disposition Plan: SNF   Procedures     Consults      Medications  Scheduled Meds: . albuterol  2 puff Inhalation TID  . allopurinol  450 mg Oral QHS  . antiseptic oral rinse  15 mL Mouth Rinse q12n4p  . aspirin EC  81 mg Oral Daily  . atenolol  25 mg Oral BID  . atorvastatin  20 mg Oral QHS  . azithromycin (ZITHROMAX) 500 MG IVPB  500 mg Intravenous Q24H  . azithromycin (ZITHROMAX) 500 MG IVPB  500 mg Intravenous NOW  . cefTRIAXone (ROCEPHIN)  IV  1 g Intravenous Q24H  . chlorhexidine  15 mL Mouth Rinse BID  . feeding supplement (RESOURCE BREEZE)  1 Container Oral BID BM  . heparin  5,000 Units Subcutaneous Q8H  . losartan  100 mg Oral Daily  . potassium chloride  40 mEq Oral BID  . sodium chloride  3 mL Intravenous Q12H   Continuous Infusions:  PRN Meds:.acetaminophen, acetaminophen, albuterol, alum & mag hydroxide-simeth, guaiFENesin-dextromethorphan, hydrALAZINE, HYDROcodone-acetaminophen,  morphine injection, ondansetron (ZOFRAN) IV, ondansetron, polyethylene glycol  DVT Prophylaxis  Heparin    Lab Results  Component Value Date   PLT 210 06/10/2013    Antibiotics     Anti-infectives   Start     Dose/Rate Route Frequency Ordered Stop   06/11/13 0045  azithromycin (ZITHROMAX) 500 mg in dextrose 5 % 250 mL IVPB     500 mg 250 mL/hr over 60 Minutes Intravenous NOW 06/11/13 0041 06/12/13 0045   06/10/13 1815  cefTRIAXone (ROCEPHIN) 1 g in dextrose 5 % 50 mL IVPB     1 g 100 mL/hr over 30 Minutes Intravenous Every 24 hours 06/09/13 1857     06/10/13 1630  azithromycin (ZITHROMAX) 500 mg in dextrose 5 % 250 mL IVPB  Status:  Discontinued     500 mg 250 mL/hr over 60 Minutes Intravenous Every 24 hours 06/09/13 1857 06/09/13 1927   06/09/13 2000  azithromycin (ZITHROMAX) 500 mg in dextrose 5 % 250 mL IVPB  500 mg 250 mL/hr over 60 Minutes Intravenous Every 24 hours 06/09/13 1927     06/09/13 1630  cefTRIAXone (ROCEPHIN) 1 g in dextrose 5 % 50 mL IVPB  Status:  Discontinued     1 g 100 mL/hr over 30 Minutes Intravenous  Once 06/09/13 1622 06/09/13 1843   06/09/13 1630  azithromycin (ZITHROMAX) 500 mg in dextrose 5 % 250 mL IVPB  Status:  Discontinued     500 mg 250 mL/hr over 60 Minutes Intravenous  Once 06/09/13 1622 06/09/13 1857          Subjective:   [name] today has, No headache, No chest pain, No abdominal pain - No Nausea, No new weakness tingling or numbness, improved Cough - SOB.    Objective:   Filed Vitals:   06/10/13 2143 06/11/13 0500 06/11/13 0518 06/11/13 0950  BP:   133/68   Pulse:   90   Temp:   97.3 F (36.3 C)   TempSrc:   Oral   Resp:   20   Height:      Weight:  47.373 kg (104 lb 7 oz)    SpO2: 97%  97% 95%    Wt Readings from Last 3 Encounters:  06/11/13 47.373 kg (104 lb 7 oz)  12/02/12 48.6 kg (107 lb 2.3 oz)     Intake/Output Summary (Last 24 hours) at 06/11/13 1033 Last data filed at 06/10/13 1850  Gross per 24  hour  Intake      0 ml  Output    175 ml  Net   -175 ml    Exam Awake Alert, Oriented X 3, No new F.N deficits, Normal affect Walker.AT,PERRAL Supple Neck,No JVD, No cervical lymphadenopathy appriciated.  Symmetrical Chest wall movement, Good air movement bilaterally, Coarse Bilat B sounds but much better RRR,No Gallops,Rubs or new Murmurs, No Parasternal Heave +ve B.Sounds, Abd Soft, Non tender, No organomegaly appriciated, No rebound - guarding or rigidity. No Cyanosis, Clubbing or edema, No new Rash or bruise      Data Review   Micro Results Recent Results (from the past 240 hour(s))  URINE CULTURE     Status: None   Collection Time    06/09/13  5:38 PM      Result Value Range Status   Specimen Description URINE, CLEAN CATCH   Final   Special Requests Normal   Final   Culture  Setup Time     Final   Value: 06/10/2013 01:45     Performed at [facility] Count     Final   Value: 70,000 COLONIES/ML     Performed at [facility]   Culture     Final   Value: Multiple bacterial morphotypes present, none predominant. Suggest appropriate recollection if clinically indicated.     Performed at [facility]   Report Status 06/10/2013 FINAL   Final  MRSA PCR SCREENING     Status: None   Collection Time    06/10/13  2:02 AM      Result Value Range Status   MRSA by PCR NEGATIVE  NEGATIVE Final   Comment:            The GeneXpert MRSA Assay (FDA     approved for NASAL specimens     only), is one component of a     comprehensive MRSA colonization     surveillance program. It is not     intended to diagnose MRSA  infection nor to guide or     monitor treatment for     MRSA infections.  CULTURE, EXPECTORATED SPUTUM-ASSESSMENT     Status: None   Collection Time    06/10/13  9:30 AM      Result Value Range Status   Specimen Description SPUTUM   Final   Special Requests NONE   Final   Sputum evaluation     Final   Value: THIS SPECIMEN IS  ACCEPTABLE. RESPIRATORY CULTURE REPORT TO FOLLOW.   Report Status 06/10/2013 FINAL   Final  CULTURE, RESPIRATORY (NON-EXPECTORATED)     Status: None   Collection Time    06/10/13  9:30 AM      Result Value Range Status   Specimen Description SPUTUM   Final   Special Requests NONE   Final   Gram Stain PENDING   Incomplete   Culture     Final   Value: NORMAL OROPHARYNGEAL FLORA     Performed at [facility]   Report Status PENDING   Incomplete    Radiology Reports Dg Chest 2 View (if Patient Has Fever And/or Copd)  06/09/2013   CLINICAL DATA:  Shortness of breath And cough  EXAM: CHEST  2 VIEW  COMPARISON:  12/02/2012  FINDINGS: The cardiac shadow is stable. Previously seen infiltrates have resolved in the interval. Diffuse chronic fibrotic changes are noted. No sizable effusion or focal infiltrate is seen. No acute bony abnormality is noted.  IMPRESSION: Chronic changes without acute abnormality.   Electronically Signed   By: Alcide Clever M.D.   On: 06/09/2013 16:49    CBC  Recent Labs Lab 06/09/13 1543 06/10/13 0430  WBC 11.4* 12.0*  HGB 12.3 10.5*  HCT 37.8 32.5*  PLT 218 210  MCV 90.9 90.8  MCH 29.6 29.3  MCHC 32.5 32.3  RDW 14.4 14.2    Chemistries   Recent Labs Lab 06/09/13 1543 06/10/13 0430 06/11/13 0601  NA 136 135  --   K 3.2* 2.8* 4.2  CL 97 99  --   CO2 27 26  --   GLUCOSE 101* 93  --   BUN 19 18  --   CREATININE 0.95 0.97  --   CALCIUM 9.9 8.9  --   MG  --  1.7 1.9  AST 18  --   --   ALT 10  --   --   ALKPHOS 74  --   --   BILITOT 1.0  --   --    ------------------------------------------------------------------------------------------------------------------ estimated creatinine clearance is 30 ml/min (by C-G formula based on Cr of 0.97). ------------------------------------------------------------------------------------------------------------------ No results found for this basename: HGBA1C,  in the last 72  hours ------------------------------------------------------------------------------------------------------------------ No results found for this basename: CHOL, HDL, LDLCALC, TRIG, CHOLHDL, LDLDIRECT,  in the last 72 hours ------------------------------------------------------------------------------------------------------------------ No results found for this basename: TSH, T4TOTAL, FREET3, T3FREE, THYROIDAB,  in the last 72 hours ------------------------------------------------------------------------------------------------------------------ No results found for this basename: VITAMINB12, FOLATE, FERRITIN, TIBC, [name], RETICCTPCT,  in the last 72 hours  Coagulation profile No results found for this basename: INR, PROTIME,  in the last 168 hours  No results found for this basename: DDIMER,  in the last 72 hours  Cardiac Enzymes No results found for this basename: CK, CKMB, TROPONINI, MYOGLOBIN,  in the last 168 hours ------------------------------------------------------------------------------------------------------------------ No components found with this basename: POCBNP,      Time Spent in minutes   35   Susa Raring K M.D on 06/11/2013 at 10:33 AM  Between 7am to 7pm - Pager - [phone]  After 7pm go to [email or web address] - password [email or web address]  And look for the night coverage person covering for me after hours  [facility] Office  [phone]

## 2013-06-11 NOTE — Progress Notes (Addendum)
Clinical Social Work Department CLINICAL SOCIAL WORK PLACEMENT NOTE 06/11/2013  Patient:  BOSTON, CATARINO  Account Number:  [id number] Admit date:  06/09/2013  Clinical Social Worker:  Carren Rang  Date/time:  06/11/2013 02:26 PM  Clinical Social Work is seeking post-discharge placement for this patient at the following level of care:   SKILLED NURSING   (*CSW will update this form in Epic as items are completed)   06/11/2013  Patient/family provided with [facility] Department of Clinical Social Work's list of facilities offering this level of care within the geographic area requested by the patient (or if unable, by the patient's family).  06/11/2013  Patient/family informed of their freedom to choose among providers that offer the needed level of care, that participate in Medicare, Medicaid or managed care program needed by the patient, have an available bed and are willing to accept the patient.  06/11/2013  Patient/family informed of [facility]' ownership interest in [facility], as well as of the fact that they are under no obligation to receive care at this facility.  PASARR submitted to EDS on 06/11/2013 PASARR number received from [employer] on 06/11/2013  FL2 transmitted to all facilities in geographic area requested by pt/family on  06/11/2013 FL2 transmitted to all facilities within larger geographic area on   Patient informed that his/her managed care company has contracts with or will negotiate with  certain facilities, including the following:     Patient/family informed of bed offers received:  06/13/2013 ([facility], [facility]) Patient chooses bed at  Physician recommends and patient chooses bed at    Patient to be transferred to  on   Patient to be transferred to facility by   The following physician request were entered in Epic:   Additional Comments:  Maree Krabbe, MSW, [employer] [phone]

## 2013-06-11 NOTE — Progress Notes (Signed)
Clinical Social Work Department BRIEF PSYCHOSOCIAL ASSESSMENT 06/11/2013  Patient:  [name], [name]     Account Number:  [id number]     Admit date:  06/09/2013  Clinical Social Worker:  Carren Rang  Date/Time:  06/11/2013 02:15 PM  Referred by:  Physician  Date Referred:  06/11/2013 Referred for  SNF Placement   Other Referral:   Interview type:  Patient Other interview type:    PSYCHOSOCIAL DATA Living Status:  ALONE Admitted from facility:   Level of care:   Primary support name:  Janalyn Rouse Primary support relationship to patient:  SIBLING Degree of support available:   Okay    CURRENT CONCERNS Current Concerns  Post-Acute Placement   Other Concerns:    SOCIAL WORK ASSESSMENT / PLAN Clinical Social Worker received referral for SNF placement at d/c. CSW introduced self and explained reason for visit. CSW explained SNF process to patient. Patient reported she is agreeable for SNF placement.  Patient stated that she is active with [facility], but would be agreeable to short term SNF- CSW will complete FL2 for MD's signature and will update patient when bed offers are received.   Assessment/plan status:  Psychosocial Support/Ongoing Assessment of Needs Other assessment/ plan:   Information/referral to community resources:   SNF information    PATIENT'S/FAMILY'S RESPONSE TO PLAN OF CARE: Patient is agreeable for SNF placement.       Maree Krabbe, MSW, [facility] [phone]

## 2013-06-12 LAB — CULTURE, RESPIRATORY W GRAM STAIN: Culture: NORMAL

## 2013-06-12 MED ORDER — AZITHROMYCIN 500 MG PO TABS
500.0000 mg | ORAL_TABLET | Freq: Every day | ORAL | Status: DC
  Administered 2013-06-12 – 2013-06-14 (×3): 500 mg via ORAL
  Filled 2013-06-12 (×4): qty 1

## 2013-06-12 NOTE — Progress Notes (Signed)
[facility]                                                                                Patient Demographics  [name], is a 79 y.o. female, DOB - 01-Feb-1923, NWG:[id number]  Admit date - 06/09/2013   Admitting Physician Leroy Sea, MD  Outpatient Primary MD for the patient is Pearson Grippe, MD  LOS - 3   Chief Complaint  Patient presents with  . Shortness of Breath  . Cough        Assessment & Plan    1. Early community acquired pneumonia versus acute bronchitis. Stable on telemetry bed will dc tele, follow sputum and blood cultures, continue IV Rocephin and azithromycin, nebulizer treatments and oxygen as needed. Will add chest PT and pulmonary toilet toilet. Much improved.     2. Hypertension. Continue home medications which include beta blocker, ARB, Norvasc with parameters.     3. Gout on allopurinol which will be continued no acute issues.     4.Hypokalemia - replaced and stable.     Code Status: Full  Family Communication:    Disposition Plan: SNF   Procedures     Consults      Medications  Scheduled Meds: . albuterol  2 puff Inhalation TID  . allopurinol  450 mg Oral QHS  . antiseptic oral rinse  15 mL Mouth Rinse q12n4p  . aspirin EC  81 mg Oral Daily  . atenolol  25 mg Oral BID  . atorvastatin  20 mg Oral QHS  . azithromycin (ZITHROMAX) 500 MG IVPB  500 mg Intravenous Q24H  . cefTRIAXone (ROCEPHIN)  IV  1 g Intravenous Q24H  . chlorhexidine  15 mL Mouth Rinse BID  . feeding supplement (RESOURCE BREEZE)  1 Container Oral BID BM  . heparin  5,000 Units Subcutaneous Q8H  . losartan  100 mg Oral Daily  . potassium chloride  40 mEq Oral BID  . sodium chloride  3 mL Intravenous Q12H   Continuous Infusions:  PRN Meds:.acetaminophen, acetaminophen, albuterol, alum & mag hydroxide-simeth, guaiFENesin-dextromethorphan, hydrALAZINE, HYDROcodone-acetaminophen, morphine injection, ondansetron (ZOFRAN) IV, ondansetron,  polyethylene glycol  DVT Prophylaxis  Heparin    Lab Results  Component Value Date   PLT 210 06/10/2013    Antibiotics     Anti-infectives   Start     Dose/Rate Route Frequency Ordered Stop   06/11/13 0045  azithromycin (ZITHROMAX) 500 mg in dextrose 5 % 250 mL IVPB     500 mg 250 mL/hr over 60 Minutes Intravenous NOW 06/11/13 0041 06/12/13 0045   06/10/13 1815  cefTRIAXone (ROCEPHIN) 1 g in dextrose 5 % 50 mL IVPB     1 g 100 mL/hr over 30 Minutes Intravenous Every 24 hours 06/09/13 1857     06/10/13 1630  azithromycin (ZITHROMAX) 500 mg in dextrose 5 % 250 mL IVPB  Status:  Discontinued     500 mg 250 mL/hr over 60 Minutes Intravenous Every 24 hours 06/09/13 1857 06/09/13 1927   06/09/13 2000  azithromycin (ZITHROMAX) 500 mg in dextrose 5 % 250 mL IVPB     500 mg 250 mL/hr over 60 Minutes Intravenous Every 24 hours 06/09/13  1927     06/09/13 1630  cefTRIAXone (ROCEPHIN) 1 g in dextrose 5 % 50 mL IVPB  Status:  Discontinued     1 g 100 mL/hr over 30 Minutes Intravenous  Once 06/09/13 1622 06/09/13 1843   06/09/13 1630  azithromycin (ZITHROMAX) 500 mg in dextrose 5 % 250 mL IVPB  Status:  Discontinued     500 mg 250 mL/hr over 60 Minutes Intravenous  Once 06/09/13 1622 06/09/13 1857          Subjective:   [name] today has, No headache, No chest pain, No abdominal pain - No Nausea, No new weakness tingling or numbness, improved Cough - SOB.    Objective:   Filed Vitals:   06/11/13 1507 06/11/13 2107 06/11/13 2114 06/12/13 0520  BP: 126/67 123/69  154/76  Pulse: 71 68  63  Temp: 97.8 F (36.6 C) 97.7 F (36.5 C)  98 F (36.7 C)  TempSrc: Oral Oral  Oral  Resp: 20 18  18   Height:      Weight:    47.401 kg (104 lb 8 oz)  SpO2: 100% 99% 99% 93%    Wt Readings from Last 3 Encounters:  06/12/13 47.401 kg (104 lb 8 oz)  12/02/12 48.6 kg (107 lb 2.3 oz)    No intake or output data in the 24 hours ending 06/12/13 0904  Exam Awake Alert, Oriented X 3, No  new F.N deficits, Normal affect [place].AT,PERRAL Supple Neck,No JVD, No cervical lymphadenopathy appriciated.  Symmetrical Chest wall movement, Good air movement bilaterally, Coarse Bilat B sounds but much better RRR,No Gallops,Rubs or new Murmurs, No Parasternal Heave +ve B.Sounds, Abd Soft, Non tender, No organomegaly appriciated, No rebound - guarding or rigidity. No Cyanosis, Clubbing or edema, No new Rash or bruise      Data Review   Micro Results Recent Results (from the past 240 hour(s))  URINE CULTURE     Status: None   Collection Time    06/09/13  5:38 PM      Result Value Range Status   Specimen Description URINE, CLEAN CATCH   Final   Special Requests Normal   Final   Culture  Setup Time     Final   Value: 06/10/2013 01:45     Performed at [facility] Count     Final   Value: 70,000 COLONIES/ML     Performed at [facility]   Culture     Final   Value: Multiple bacterial morphotypes present, none predominant. Suggest appropriate recollection if clinically indicated.     Performed at [facility]   Report Status 06/10/2013 FINAL   Final  CULTURE, BLOOD (ROUTINE X 2)     Status: None   Collection Time    06/09/13  6:00 PM      Result Value Range Status   Specimen Description BLOOD ARM RIGHT   Final   Special Requests BOTTLES DRAWN AEROBIC ONLY 10CC   Final   Culture  Setup Time     Final   Value: 06/10/2013 01:44     Performed at [facility]   Culture     Final   Value:        BLOOD CULTURE RECEIVED NO GROWTH TO DATE CULTURE WILL BE HELD FOR 5 DAYS BEFORE ISSUING A FINAL NEGATIVE REPORT     Performed at [facility]   Report Status PENDING   Incomplete  CULTURE, BLOOD (ROUTINE X  2)     Status: None   Collection Time    06/09/13  6:08 PM      Result Value Range Status   Specimen Description BLOOD ARM LEFT   Final   Special Requests BOTTLES DRAWN AEROBIC ONLY 10CC   Final   Culture  Setup Time     Final   Value:  06/10/2013 01:44     Performed at [facility]   Culture     Final   Value:        BLOOD CULTURE RECEIVED NO GROWTH TO DATE CULTURE WILL BE HELD FOR 5 DAYS BEFORE ISSUING A FINAL NEGATIVE REPORT     Performed at [facility]   Report Status PENDING   Incomplete  MRSA PCR SCREENING     Status: None   Collection Time    06/10/13  2:02 AM      Result Value Range Status   MRSA by PCR NEGATIVE  NEGATIVE Final   Comment:            The GeneXpert MRSA Assay (FDA     approved for NASAL specimens     only), is one component of a     comprehensive MRSA colonization     surveillance program. It is not     intended to diagnose MRSA     infection nor to guide or     monitor treatment for     MRSA infections.  CULTURE, EXPECTORATED SPUTUM-ASSESSMENT     Status: None   Collection Time    06/10/13  9:30 AM      Result Value Range Status   Specimen Description SPUTUM   Final   Special Requests NONE   Final   Sputum evaluation     Final   Value: THIS SPECIMEN IS ACCEPTABLE. RESPIRATORY CULTURE REPORT TO FOLLOW.   Report Status 06/10/2013 FINAL   Final  CULTURE, RESPIRATORY (NON-EXPECTORATED)     Status: None   Collection Time    06/10/13  9:30 AM      Result Value Range Status   Specimen Description SPUTUM   Final   Special Requests NONE   Final   Gram Stain     Final   Value: MODERATE WBC PRESENT, PREDOMINANTLY PMN     FEW SQUAMOUS EPITHELIAL CELLS PRESENT     RARE GRAM POSITIVE COCCI IN PAIRS     RARE GRAM POSITIVE RODS     Performed at [facility]   Culture     Final   Value: NORMAL OROPHARYNGEAL FLORA     Performed at [facility]   Report Status PENDING   Incomplete    Radiology Reports Dg Chest 2 View (if Patient Has Fever And/or Copd)  06/09/2013   CLINICAL DATA:  Shortness of breath And cough  EXAM: CHEST  2 VIEW  COMPARISON:  12/02/2012  FINDINGS: The cardiac shadow is stable. Previously seen infiltrates have resolved in the interval. Diffuse  chronic fibrotic changes are noted. No sizable effusion or focal infiltrate is seen. No acute bony abnormality is noted.  IMPRESSION: Chronic changes without acute abnormality.   Electronically Signed   By: Alcide Clever M.D.   On: 06/09/2013 16:49    CBC  Recent Labs Lab 06/09/13 1543 06/10/13 0430  WBC 11.4* 12.0*  HGB 12.3 10.5*  HCT 37.8 32.5*  PLT 218 210  MCV 90.9 90.8  MCH 29.6 29.3  MCHC 32.5 32.3  RDW 14.4 14.2  Chemistries   Recent Labs Lab 06/09/13 1543 06/10/13 0430 06/11/13 0601  NA 136 135  --   K 3.2* 2.8* 4.2  CL 97 99  --   CO2 27 26  --   GLUCOSE 101* 93  --   BUN 19 18  --   CREATININE 0.95 0.97  --   CALCIUM 9.9 8.9  --   MG  --  1.7 1.9  AST 18  --   --   ALT 10  --   --   ALKPHOS 74  --   --   BILITOT 1.0  --   --    ------------------------------------------------------------------------------------------------------------------ estimated creatinine clearance is 30 ml/min (by C-G formula based on Cr of 0.97). ------------------------------------------------------------------------------------------------------------------ No results found for this basename: HGBA1C,  in the last 72 hours ------------------------------------------------------------------------------------------------------------------ No results found for this basename: CHOL, HDL, LDLCALC, TRIG, CHOLHDL, LDLDIRECT,  in the last 72 hours ------------------------------------------------------------------------------------------------------------------ No results found for this basename: TSH, T4TOTAL, FREET3, T3FREE, THYROIDAB,  in the last 72 hours ------------------------------------------------------------------------------------------------------------------ No results found for this basename: VITAMINB12, FOLATE, FERRITIN, TIBC, IRON, RETICCTPCT,  in the last 72 hours  Coagulation profile No results found for this basename: INR, PROTIME,  in the last 168 hours  No results  found for this basename: DDIMER,  in the last 72 hours  Cardiac Enzymes No results found for this basename: CK, CKMB, TROPONINI, MYOGLOBIN,  in the last 168 hours ------------------------------------------------------------------------------------------------------------------ No components found with this basename: POCBNP,      Time Spent in minutes   35   Elbie Statzer K M.D on 06/12/2013 at 9:04 AM  Between 7am to 7pm - Pager - [phone]  After 7pm go to [email or web address] - password [email or web address]  And look for the night coverage person covering for me after hours  [facility] Office  [phone]

## 2013-06-12 NOTE — Progress Notes (Signed)
Pt ambulated in hallway with supervision only, for 350 ft. Pt was steady on feet and tolerated ambulation very well.  Peter Congo RN

## 2013-06-13 MED ORDER — POLYETHYLENE GLYCOL 3350 17 G PO PACK: 17.0000 g | PACK | Freq: Two times a day (BID) | ORAL | Status: DC | PRN

## 2013-06-13 MED ORDER — AZITHROMYCIN 500 MG PO TABS: 500.0000 mg | ORAL_TABLET | Freq: Every day | ORAL | Status: DC

## 2013-06-13 MED ORDER — BISACODYL 10 MG RE SUPP
10.0000 mg | Freq: Every day | RECTAL | Status: DC
  Administered 2013-06-13: 10 mg via RECTAL
  Filled 2013-06-13: qty 1

## 2013-06-13 MED ORDER — ALBUTEROL SULFATE (5 MG/ML) 0.5% IN NEBU: 2.5000 mg | INHALATION_SOLUTION | RESPIRATORY_TRACT | Status: DC | PRN

## 2013-06-13 MED ORDER — HYDRALAZINE HCL 25 MG PO TABS
25.0000 mg | ORAL_TABLET | Freq: Three times a day (TID) | ORAL | Status: DC
  Administered 2013-06-13 (×2): 25 mg via ORAL
  Filled 2013-06-13 (×4): qty 1

## 2013-06-13 MED ORDER — DOCUSATE SODIUM 100 MG PO CAPS
200.0000 mg | ORAL_CAPSULE | Freq: Two times a day (BID) | ORAL | Status: DC
  Administered 2013-06-13 – 2013-06-14 (×3): 200 mg via ORAL
  Filled 2013-06-13 (×4): qty 2

## 2013-06-13 MED ORDER — POLYETHYLENE GLYCOL 3350 17 G PO PACK
17.0000 g | PACK | Freq: Two times a day (BID) | ORAL | Status: DC
  Administered 2013-06-13 – 2013-06-14 (×3): 17 g via ORAL
  Filled 2013-06-13 (×4): qty 1

## 2013-06-13 MED ORDER — HYDRALAZINE HCL 25 MG PO TABS: 25.0000 mg | ORAL_TABLET | Freq: Three times a day (TID) | ORAL | Status: DC

## 2013-06-13 MED ORDER — ALBUTEROL SULFATE HFA 108 (90 BASE) MCG/ACT IN AERS: 2.0000 | INHALATION_SPRAY | Freq: Four times a day (QID) | RESPIRATORY_TRACT | Status: DC | PRN

## 2013-06-13 MED ORDER — AMLODIPINE BESYLATE 10 MG PO TABS: 10.0000 mg | ORAL_TABLET | Freq: Every day | ORAL | Status: DC

## 2013-06-13 MED ORDER — BOOST / RESOURCE BREEZE PO LIQD: 1.0000 | Freq: Two times a day (BID) | ORAL | Status: DC

## 2013-06-13 NOTE — Evaluation (Signed)
Physical Therapy Evaluation Patient Details Name: [name] MRN: [id number] DOB: [birth year] Today's Date: 06/13/2013 Time: 0865-7846 PT Time Calculation (min): 8 min  PT Assessment / Plan / Recommendation History of Present Illness  Pt adm with early PNA vs acute bronchitis.  Clinical Impression  Patient lives alone and does not have adequate support to safely discharge home due to decr balance and activity tolerance. Continue to recommend skilled nursing facility for post-acute therapy needs.     PT Assessment       Follow Up Recommendations  SNF    Does the patient have the potential to tolerate intense rehabilitation      Barriers to Discharge        Equipment Recommendations  None recommended by PT    Recommendations for Other Services     Frequency Min 3X/week    Precautions / Restrictions Precautions Precautions: Fall   Pertinent Vitals/Pain See flow sheet.      Mobility  Transfers Sit to Stand: 4: Min guard;With upper extremity assist;With armrests;From chair/3-in-1 Stand to Sit: 4: Min guard;With upper extremity assist;With armrests;To chair/3-in-1 Details for Transfer Assistance: Assist for balance. Ambulation/Gait Ambulation/Gait Assistance: 4: Min assist Ambulation Distance (Feet): 150 Feet Assistive device: None Ambulation/Gait Assistance Details: Unsteady and assist needed for balance. Gait Pattern: Step-through pattern;Decreased step length - right;Decreased step length - left;Narrow base of support Gait velocity: decr    Exercises     PT Diagnosis:    PT Problem List:   PT Treatment Interventions:       PT Goals(Current goals can be found in the care plan section)    Visit Information  Last PT Received On: 06/13/13 Assistance Needed: +1 History of Present Illness: Pt adm with early PNA vs acute bronchitis.       Prior Functioning       Cognition  Cognition Arousal/Alertness: Awake/alert Behavior During Therapy: WFL for  tasks assessed/performed Overall Cognitive Status: Within Functional Limits for tasks assessed    Extremity/Trunk Assessment     Balance Static Standing Balance Static Standing - Balance Support: No upper extremity supported Static Standing - Level of Assistance: 4: Min assist  End of Session PT - End of Session Activity Tolerance: Patient tolerated treatment well Patient left: in chair;with call bell/phone within reach Nurse Communication: Mobility status  GP     [name] 06/13/2013, 2:16 PM  [employer] PT [phone]

## 2013-06-13 NOTE — Clinical Social Work Note (Signed)
CSW submitted clinicals to [facility] for insurance authorization.  Vickii Penna, LCSWA [phone]  Clinical Social Work

## 2013-06-13 NOTE — Clinical Documentation Improvement (Signed)
THIS DOCUMENT IS NOT A PERMANENT PART OF THE MEDICAL RECORD  Please update your documentation with the medical record to reflect your response to this query. If you need help knowing how to do this please call [phone].  06/13/13   To Dr. Stanford Scotland. [facility],  In a better effort to capture your patient's severity of illness, reflect appropriate length of stay and utilization of resources, a review of the patient medical record has revealed the following indicators.    Based on your clinical judgment, please clarify and document in a progress note and/or discharge summary the clinical condition associated with the following supporting information:  In responding to this query please exercise your independent judgment.  The fact that a query is asked, does not imply that any particular answer is desired or expected.  Per nutrition consult patient meets criteria for " Severe malnutrition in the context of chronic illness/Underweight '  If this description is appropriate please document corresponding diagnoses.  Note Nutritional assessment 06/10/13 Thank you    Possible Clinical Conditions?  Severe Malnutrition   Protein Calorie Malnutrition Severe Protein Calorie Malnutrition  Other Condition Cannot clinically determine   And   Underweight Cachexia   Other Condition Cannot clinically determine        BMI 16.91 kg  Nutrition Consult :NUTRITION DIAGNOSIS:  Inadequate oral intake related to variable appetite as evidenced by pt report and weight loss previous wt 126 lbs current 101 lbs    You may use possible, probable, or suspect with inpatient documentation. possible, probable, suspected diagnoses MUST be documented at the time of discharge  Reviewed: additional documentation in the medical record  Thank Ar[facility] [place]  Clinical Documentation Specialist: [phone] Health Information Management [place]

## 2013-06-13 NOTE — Discharge Summary (Addendum)
[facility]                                                                                   [name], is a 79 y.o. female  DOB 20-Sep-1922  MRN [id number].  Admission date:  06/09/2013  Admitting Physician  Leroy Sea, MD  Discharge Date:  06/14/2013   Primary MD  Pearson Grippe, MD  Recommendations for primary care physician for things to follow:    Follow CBC BMP closely, repeat 2 view chest x-ray in a week.   Admission Diagnosis  Shortness of breath [786.05] Gouty arthritis [274.00] HTN (hypertension) [401.9] SOB (shortness of breath) [786.05] Leukocytopenia, unspecified [288.50] CAP (community acquired pneumonia) [486] Fever [780.60]  Discharge Diagnosis   community-acquired pneumonia  Active Problems:   CAP (community acquired pneumonia)   SOB (shortness of breath)   HTN (hypertension)   Gouty arthritis   Leukocytopenia, unspecified      Past Medical History  Diagnosis Date  . Hypertension   . Arthritis   . Gout     Past Surgical History  Procedure Laterality Date  . Appendectomy       Discharge Condition: Stable       Follow-up Information   Follow up with Pearson Grippe, MD. Schedule an appointment as soon as possible for a visit in 1 week.   Specialty:  Internal Medicine   Contact information:   [address] [place] [address] [phone]         Consults obtained -     Discharge Medications      Medication List    STOP taking these medications       GOODY HEADACHE PO     hydrochlorothiazide 12.5 MG capsule  Commonly known as:  MICROZIDE      TAKE these medications       acetaminophen-codeine 300-30 MG per tablet  Commonly known as:  TYLENOL #3  Take 1 tablet by mouth every 6 (six) hours as needed (gout pain).     albuterol (5 MG/ML) 0.5% nebulizer solution  Commonly known as:  PROVENTIL  Take 0.5 mLs (2.5 mg total) by nebulization every 2 (two) hours as needed for wheezing.     albuterol 108 (90 BASE) MCG/ACT inhaler  Commonly known as:  PROVENTIL HFA;VENTOLIN HFA  Inhale 2 puffs into the lungs every 6 (six) hours as needed for wheezing or shortness of breath.     allopurinol 300 MG tablet  Commonly known as:  ZYLOPRIM  Take 450 mg by mouth at bedtime.     amLODipine 10 MG tablet  Commonly known as:  NORVASC  Take 1 tablet (10 mg total) by mouth daily.     atenolol 25 MG tablet  Commonly known as:  TENORMIN  Take 25 mg by mouth 2 (two) times daily.     azithromycin 500 MG tablet  Commonly known as:  ZITHROMAX  Take 1 tablet (500 mg total) by mouth daily. 4 more days     feeding supplement (RESOURCE BREEZE) Liqd  Take 1 Container by mouth 2 (two) times daily between meals.     hydrALAZINE 10 MG tablet  Commonly  known as:  APRESOLINE  Take 1 tablet (10 mg total) by mouth every 8 (eight) hours.     losartan 100 MG tablet  Commonly known as:  COZAAR  Take 100 mg by mouth daily.     OVER THE COUNTER MEDICATION  Place 1 drop into both eyes 2 (two) times daily as needed (dry eyes).     oxaprozin 600 MG tablet  Commonly known as:  DAYPRO  Take 1,200 mg by mouth daily as needed (gout).     oxyCODONE-acetaminophen 5-325 MG per tablet  Commonly known as:  PERCOCET/ROXICET  Take 1 tablet by mouth 2 (two) times daily.     polyethylene glycol packet  Commonly known as:  MIRALAX / GLYCOLAX  Take 17 g by mouth 2 (two) times daily as needed (constipation).     simvastatin 40 MG tablet  Commonly known as:  ZOCOR  Take 40 mg by mouth at bedtime.         Diet and Activity recommendation: See Discharge Instructions below   Discharge Instructions      Follow with Primary MD Pearson Grippe, MD in 7 days   Get CBC, CMP, checked 7 days by Primary MD and again as instructed by your Primary MD. get a 2 view chest x-ray done in 1 week  Get Medicines reviewed and adjusted.  Please request your Prim.MD to go over all Hospital Tests and Procedure/Radiological  results at the follow up, please get all Hospital records sent to your Prim MD by signing hospital release before you go home.  Activity: As tolerated with Full fall precautions use walker/cane & assistance as needed   Diet: Heart healthy with Aspiration precautions.  For Heart failure patients - Check your Weight same time everyday, if you gain over 2 pounds, or you develop in leg swelling, experience more shortness of breath or chest pain, call your Primary MD immediately. Follow Cardiac Low Salt Diet and 1.8 lit/day fluid restriction.  Disposition SNF  If you experience worsening of your admission symptoms, develop shortness of breath, life threatening emergency, suicidal or homicidal thoughts you must seek medical attention immediately by calling 911 or calling your MD immediately  if symptoms less severe.  You Must read complete instructions/literature along with all the possible adverse reactions/side effects for all the Medicines you take and that have been prescribed to you. Take any new Medicines after you have completely understood and accpet all the possible adverse reactions/side effects.   Do not drive and provide baby sitting services if your were admitted for syncope or siezures until you have seen by Primary MD or a Neurologist and advised to do so again.  Do not drive when taking Pain medications.    Do not take more than prescribed Pain, Sleep and Anxiety Medications  Special Instructions: If you have smoked or chewed Tobacco  in the last 2 yrs please stop smoking, stop any regular Alcohol  and or any Recreational drug use.  Wear Seat belts while driving.   Please note  You were cared for by a hospitalist during your hospital stay. If you have any questions about your discharge medications or the care you received while you were in the hospital after you are discharged, you can call the unit and asked to speak with the hospitalist on call if the hospitalist that took  care of you is not available. Once you are discharged, your primary care physician will handle any further medical issues. Please note that NO REFILLS for  any discharge medications will be authorized once you are discharged, as it is imperative that you return to your primary care physician (or establish a relationship with a primary care physician if you do not have one) for your aftercare needs so that they can reassess your need for medications and monitor your lab values.    Major procedures and Radiology Reports - PLEASE review detailed and final reports for all details, in brief -       Dg Chest 2 View (if Patient Has Fever And/or Copd)  06/09/2013   CLINICAL DATA:  Shortness of breath And cough  EXAM: CHEST  2 VIEW  COMPARISON:  12/02/2012  FINDINGS: The cardiac shadow is stable. Previously seen infiltrates have resolved in the interval. Diffuse chronic fibrotic changes are noted. No sizable effusion or focal infiltrate is seen. No acute bony abnormality is noted.  IMPRESSION: Chronic changes without acute abnormality.   Electronically Signed   By: Alcide Clever M.D.   On: 06/09/2013 16:49    Micro Results      Recent Results (from the past 240 hour(s))  URINE CULTURE     Status: None   Collection Time    06/09/13  5:38 PM      Result Value Range Status   Specimen Description URINE, CLEAN CATCH   Final   Special Requests Normal   Final   Culture  Setup Time     Final   Value: 06/10/2013 01:45     Performed at [facility] Count     Final   Value: 70,000 COLONIES/ML     Performed at [facility]   Culture     Final   Value: Multiple bacterial morphotypes present, none predominant. Suggest appropriate recollection if clinically indicated.     Performed at [facility]   Report Status 06/10/2013 FINAL   Final  CULTURE, BLOOD (ROUTINE X 2)     Status: None   Collection Time    06/09/13  6:00 PM      Result Value Range Status   Specimen  Description BLOOD ARM RIGHT   Final   Special Requests BOTTLES DRAWN AEROBIC ONLY 10CC   Final   Culture  Setup Time     Final   Value: 06/10/2013 01:44     Performed at [facility]   Culture     Final   Value:        BLOOD CULTURE RECEIVED NO GROWTH TO DATE CULTURE WILL BE HELD FOR 5 DAYS BEFORE ISSUING A FINAL NEGATIVE REPORT     Performed at [facility]   Report Status PENDING   Incomplete  CULTURE, BLOOD (ROUTINE X 2)     Status: None   Collection Time    06/09/13  6:08 PM      Result Value Range Status   Specimen Description BLOOD ARM LEFT   Final   Special Requests BOTTLES DRAWN AEROBIC ONLY 10CC   Final   Culture  Setup Time     Final   Value: 06/10/2013 01:44     Performed at [facility]   Culture     Final   Value:        BLOOD CULTURE RECEIVED NO GROWTH TO DATE CULTURE WILL BE HELD FOR 5 DAYS BEFORE ISSUING A FINAL NEGATIVE REPORT     Performed at [facility]   Report Status PENDING   Incomplete  MRSA PCR SCREENING  Status: None   Collection Time    06/10/13  2:02 AM      Result Value Range Status   MRSA by PCR NEGATIVE  NEGATIVE Final   Comment:            The GeneXpert MRSA Assay (FDA     approved for NASAL specimens     only), is one component of a     comprehensive MRSA colonization     surveillance program. It is not     intended to diagnose MRSA     infection nor to guide or     monitor treatment for     MRSA infections.  CULTURE, EXPECTORATED SPUTUM-ASSESSMENT     Status: None   Collection Time    06/10/13  9:30 AM      Result Value Range Status   Specimen Description SPUTUM   Final   Special Requests NONE   Final   Sputum evaluation     Final   Value: THIS SPECIMEN IS ACCEPTABLE. RESPIRATORY CULTURE REPORT TO FOLLOW.   Report Status 06/10/2013 FINAL   Final  CULTURE, RESPIRATORY (NON-EXPECTORATED)     Status: None   Collection Time    06/10/13  9:30 AM      Result Value Range Status   Specimen Description  SPUTUM   Final   Special Requests NONE   Final   Gram Stain     Final   Value: MODERATE WBC PRESENT, PREDOMINANTLY PMN     FEW SQUAMOUS EPITHELIAL CELLS PRESENT     RARE GRAM POSITIVE COCCI IN PAIRS     RARE GRAM POSITIVE RODS     Performed at [facility]   Culture     Final   Value: NORMAL OROPHARYNGEAL FLORA     Performed at [facility]   Report Status 06/12/2013 FINAL   Final     History of present illness and  Hospital Course:     Kindly see H&P for history of present illness and admission details, please review complete Labs, Consult reports and Test reports for all details in brief [name], is a 79 y.o. female, patient with history of hypertension, gout, DJD who he lives alone and is remarkably healthy for her age presented to the hospital with a few day history of cough shortness of breath and fevers, she was diagnosed with early community-acquired pneumonia versus acute bronchitis and placed on empiric antibiotics for the same. With supportive care she showed good improvement, she is now symptom-free, afebrile feeling much better, she's not short of breath and not requiring any oxygen. She will be placed on azithromycin for 4 more days and sent to rehabilitation. Will request rehabilitation staff to check CBC BMP along with a 2 view chest x-ray in 5-7 days' time.   Her blood pressure was slightly elevated hence her blood pressure medications have been adjusted as below for better control. Kindly monitor blood pressure and titrate medications as needed.    Addendum - Insurance denied SNF placed, to home with HHPT, RN and Aide.   Moderate to severe protein children malnutrition placed on protein supplements.    Today   Subjective:   [name] today has no headache,no chest abdominal pain,no new weakness tingling or numbness, feels much better wants to Rehab.  Objective:   Blood pressure 143/58, pulse 66, temperature 97.5 F (36.4 C),  temperature source Oral, resp. rate 24, height 5\' 5"  (1.651 m), weight 45.632 kg (100 lb 9.6  oz), SpO2 94.00%.   Intake/Output Summary (Last 24 hours) at 06/14/13 1025 Last data filed at 06/13/13 2227  Gross per 24 hour  Intake    120 ml  Output      0 ml  Net    120 ml    Exam Awake Alert, Oriented *3, No new F.N deficits, Normal affect [place].AT,PERRAL Supple Neck,No JVD, No cervical lymphadenopathy appriciated.  Symmetrical Chest wall movement, Good air movement bilaterally, CTAB RRR,No Gallops,Rubs or new Murmurs, No Parasternal Heave +ve B.Sounds, Abd Soft, Non tender, No organomegaly appriciated, No rebound -guarding or rigidity. No Cyanosis, Clubbing or edema, No new Rash or bruise  Data Review   CBC w Diff: Lab Results  Component Value Date   WBC 12.0* 06/10/2013   HGB 10.5* 06/10/2013   HCT 32.5* 06/10/2013   PLT 210 06/10/2013   LYMPHOPCT 6* 12/02/2012   MONOPCT 9 12/02/2012   EOSPCT 0 12/02/2012   BASOPCT 0 12/02/2012    CMP: Lab Results  Component Value Date   NA 135 06/10/2013   K 4.2 06/11/2013   CL 99 06/10/2013   CO2 26 06/10/2013   BUN 18 06/10/2013   CREATININE 0.97 06/10/2013   PROT 7.1 06/09/2013   ALBUMIN 3.5 06/09/2013   BILITOT 1.0 06/09/2013   ALKPHOS 74 06/09/2013   AST 18 06/09/2013   ALT 10 06/09/2013  .   Total Time in preparing paper work, data evaluation and todays exam - 35 minutes  Leroy Sea M.D on 06/14/2013 at 10:25 AM  [facility] Office  [phone]

## 2013-06-13 NOTE — Progress Notes (Signed)
Patient talks during sleep and screamed out several times thinking someone was in the room or something was after her.  When patient woke up, she was aware of her surroundings but was having nightmares.  Will continue to monitor.  Macarthur Critchley, RN

## 2013-06-13 NOTE — Plan of Care (Signed)
Problem: Phase II Progression Outcomes Goal: Dyspnea controlled w/progressive activity Outcome: Completed/Met Date Met:  06/13/13 Patient ambulated down the hall and around the room with no shortness of breath

## 2013-06-14 MED ORDER — WHITE PETROLATUM GEL
Status: AC
  Filled 2013-06-14: qty 5

## 2013-06-14 MED ORDER — HYDRALAZINE HCL 10 MG PO TABS
10.0000 mg | ORAL_TABLET | Freq: Three times a day (TID) | ORAL | Status: DC
  Administered 2013-06-14: 12:00:00 10 mg via ORAL
  Filled 2013-06-14 (×3): qty 1

## 2013-06-14 MED ORDER — HYDRALAZINE HCL 10 MG PO TABS: 10.0000 mg | ORAL_TABLET | Freq: Three times a day (TID) | ORAL | Status: DC

## 2013-06-14 NOTE — Progress Notes (Signed)
CSW spoke to patient about SNF options. Patient stated she wanted CSW to follow up with [facility]. CSW followed up with [facility] and sent updated clinicals to [facility] for SNF- auth. CSW awaiting SNF auth at this point, anticipated today.  Maree Krabbe, MSW, [facility]ors [phone]

## 2013-06-14 NOTE — Care Management Note (Addendum)
    Page 1 of 2   06/14/2013     3:57:20 PM   CARE MANAGEMENT NOTE 06/14/2013  Patient:  [name], [name]   Account Number:  [id number]  Date Initiated:  06/10/2013  Documentation initiated by:  [facility]  Subjective/Objective Assessment:   dx sob,  admit- pt has rolling walker at home.     Action/Plan:   lives with nephew, Jillyn Hidden # [phone]   Anticipated DC Date:  06/14/2013   Anticipated DC Plan:  SKILLED NURSING FACILITY  In-house referral  Clinical Social Worker      DC Associate Professor  CM consult      [facility] Choice  HOME HEALTH   Choice offered to / List presented to:  C-1 Patient        HH arranged  HH-1 RN  HH-2 PT  HH-3 OT  HH-6 SOCIAL WORKER      HH agency  [facility]   Status of service:  Completed, signed off Medicare Important Message given?   (If response is "NO", the following Medicare IM given date fields will be blank) Date Medicare IM given:   Date Additional Medicare IM given:    Discharge Disposition:  HOME W HOME HEALTH SERVICES  Per UR Regulation:  Reviewed for med. necessity/level of care/duration of stay  If discussed at Long Length of Stay Meetings, dates discussed:    Comments:  06/14/13 15:33 Letha Cape RN, BSN [phone] per CSW , patient's insurance states looks like patient will be denied for SNF since she is walking 150 ft without ast.  CSW informed patient of this information and patient decided to go home with [facility] services.  NCM informed, and spoke with patient and she chose [facility] for [facility], PT, OT, and Child psychotherapist. Referral made to [facility].  Soc will begin 24-48 hrs post discharge.   Informed MD.  06/10/2013 1600 NCM spoke to pt and states she will be willing to go to SNF-rehab for a couple of weeks. Isidoro Donning RN CCM Case Mgmt phone [phone]

## 2013-06-14 NOTE — Progress Notes (Signed)
CSW received phone call from [facility] stating that there is a high chance that the patient will be denied SNF authorization because "she is able to ambulate a far distance without assistance." CSW explained this information to the patient who stated she will just go home with Home Health instead of SNF. Patient states she will call family to see if they can pick her up. CSW referred this on to CM and MD. CSW signing off at this point. Please re consult if CSW needs arise.  Maree Krabbe, MSW, [facility] [phone]

## 2013-06-14 NOTE — Progress Notes (Deleted)
Clinical Social Work Department CLINICAL SOCIAL WORK PLACEMENT NOTE 06/14/2013  Patient:  [name], [name]  Account Number:  [id number] Admit date:  06/09/2013  Clinical Social Worker:  Carren Rang  Date/time:  06/11/2013 02:26 PM  Clinical Social Work is seeking post-discharge placement for this patient at the following level of care:   SKILLED NURSING   (*CSW will update this form in Epic as items are completed)   06/11/2013  Patient/family provided with [facility] Department of Clinical Social Work's list of facilities offering this level of care within the geographic area requested by the patient (or if unable, by the patient's family).  06/11/2013  Patient/family informed of their freedom to choose among providers that offer the needed level of care, that participate in Medicare, Medicaid or managed care program needed by the patient, have an available bed and are willing to accept the patient.  06/11/2013  Patient/family informed of [facility]' ownership interest in [facility], as well as of the fact that they are under no obligation to receive care at this facility.  PASARR submitted to EDS on 06/11/2013 PASARR number received from EDS on 06/11/2013  FL2 transmitted to all facilities in geographic area requested by pt/family on  06/11/2013 FL2 transmitted to all facilities within larger geographic area on   Patient informed that his/her managed care company has contracts with or will negotiate with  certain facilities, including the following:     Patient/family informed of bed offers received:  06/14/2013 Patient chooses bed at [facility] Physician recommends and patient chooses bed at    Patient to be transferred to [facility] on  06/14/2013 Patient to be transferred to facility by EMS  The following physician request were entered in Epic:   Additional Comments:  Maree Krabbe, MSW,  [facility] [phone]

## 2013-06-14 NOTE — Progress Notes (Signed)
[name] to be D/C'd Home with [facility] per MD order.  Discussed with the patient and all questions fully answered.    Medication List    STOP taking these medications       GOODY HEADACHE PO     hydrochlorothiazide 12.5 MG capsule  Commonly known as:  MICROZIDE      TAKE these medications       acetaminophen-codeine 300-30 MG per tablet  Commonly known as:  TYLENOL #3  Take 1 tablet by mouth every 6 (six) hours as needed (gout pain).     albuterol (5 MG/ML) 0.5% nebulizer solution  Commonly known as:  PROVENTIL  Take 0.5 mLs (2.5 mg total) by nebulization every 2 (two) hours as needed for wheezing.     albuterol 108 (90 BASE) MCG/ACT inhaler  Commonly known as:  PROVENTIL HFA;VENTOLIN HFA  Inhale 2 puffs into the lungs every 6 (six) hours as needed for wheezing or shortness of breath.     allopurinol 300 MG tablet  Commonly known as:  ZYLOPRIM  Take 450 mg by mouth at bedtime.     amLODipine 10 MG tablet  Commonly known as:  NORVASC  Take 1 tablet (10 mg total) by mouth daily.     atenolol 25 MG tablet  Commonly known as:  TENORMIN  Take 25 mg by mouth 2 (two) times daily.     azithromycin 500 MG tablet  Commonly known as:  ZITHROMAX  Take 1 tablet (500 mg total) by mouth daily. 4 more days     feeding supplement (RESOURCE BREEZE) Liqd  Take 1 Container by mouth 2 (two) times daily between meals.     hydrALAZINE 10 MG tablet  Commonly known as:  APRESOLINE  Take 1 tablet (10 mg total) by mouth every 8 (eight) hours.     losartan 100 MG tablet  Commonly known as:  COZAAR  Take 100 mg by mouth daily.     OVER THE COUNTER MEDICATION  Place 1 drop into both eyes 2 (two) times daily as needed (dry eyes).     oxaprozin 600 MG tablet  Commonly known as:  DAYPRO  Take 1,200 mg by mouth daily as needed (gout).     oxyCODONE-acetaminophen 5-325 MG per tablet  Commonly known as:  PERCOCET/ROXICET  Take 1 tablet by mouth 2 (two) times daily.     polyethylene  glycol packet  Commonly known as:  MIRALAX / GLYCOLAX  Take 17 g by mouth 2 (two) times daily as needed (constipation).     simvastatin 40 MG tablet  Commonly known as:  ZOCOR  Take 40 mg by mouth at bedtime.        VVS, Skin clean, dry and intact without evidence of skin break down, no evidence of skin tears noted. IV catheter discontinued intact. Site without signs and symptoms of complications. Dressing and pressure applied.  An After Visit Summary was printed and given to the patient. Follow up appointments , new prescriptions and medication administration times given. Went over PNA handout and s/s of PNA in geriatric pts. All questions answered Patient escorted via WC, and D/C home via private auto.  Cindra Eves, RN 06/14/2013 4:36 PM

## 2013-06-16 LAB — CULTURE, BLOOD (ROUTINE X 2)
Culture: NO GROWTH
Culture: NO GROWTH

## 2013-07-06 ENCOUNTER — Other Ambulatory Visit: Payer: Self-pay | Admitting: Internal Medicine

## 2013-10-07 ENCOUNTER — Encounter (HOSPITAL_COMMUNITY): Payer: Self-pay | Admitting: Emergency Medicine

## 2013-10-07 ENCOUNTER — Emergency Department (HOSPITAL_COMMUNITY): Payer: Medicare HMO

## 2013-10-07 ENCOUNTER — Inpatient Hospital Stay (HOSPITAL_COMMUNITY)
Admission: EM | Admit: 2013-10-07 | Discharge: 2013-10-17 | DRG: 193 | Disposition: A | Discharge status: Skilled Nursing Facility | Payer: Medicare HMO | Attending: Internal Medicine | Admitting: Internal Medicine

## 2013-10-07 DIAGNOSIS — A419 Sepsis, unspecified organism: Secondary | ICD-10-CM

## 2013-10-07 DIAGNOSIS — E785 Hyperlipidemia, unspecified: Secondary | ICD-10-CM | POA: Diagnosis present

## 2013-10-07 DIAGNOSIS — Z681 Body mass index (BMI) 19 or less, adult: Secondary | ICD-10-CM

## 2013-10-07 DIAGNOSIS — K219 Gastro-esophageal reflux disease without esophagitis: Secondary | ICD-10-CM | POA: Diagnosis present

## 2013-10-07 DIAGNOSIS — E876 Hypokalemia: Secondary | ICD-10-CM

## 2013-10-07 DIAGNOSIS — M109 Gout, unspecified: Secondary | ICD-10-CM

## 2013-10-07 DIAGNOSIS — R0602 Shortness of breath: Secondary | ICD-10-CM

## 2013-10-07 DIAGNOSIS — I10 Essential (primary) hypertension: Secondary | ICD-10-CM

## 2013-10-07 DIAGNOSIS — J180 Bronchopneumonia, unspecified organism: Principal | ICD-10-CM | POA: Diagnosis present

## 2013-10-07 DIAGNOSIS — K449 Diaphragmatic hernia without obstruction or gangrene: Secondary | ICD-10-CM | POA: Diagnosis present

## 2013-10-07 DIAGNOSIS — A492 Hemophilus influenzae infection, unspecified site: Secondary | ICD-10-CM | POA: Diagnosis present

## 2013-10-07 DIAGNOSIS — M129 Arthropathy, unspecified: Secondary | ICD-10-CM | POA: Diagnosis present

## 2013-10-07 DIAGNOSIS — J9600 Acute respiratory failure, unspecified whether with hypoxia or hypercapnia: Secondary | ICD-10-CM

## 2013-10-07 DIAGNOSIS — Z87891 Personal history of nicotine dependence: Secondary | ICD-10-CM

## 2013-10-07 DIAGNOSIS — R7881 Bacteremia: Secondary | ICD-10-CM

## 2013-10-07 DIAGNOSIS — I079 Rheumatic tricuspid valve disease, unspecified: Secondary | ICD-10-CM | POA: Diagnosis present

## 2013-10-07 DIAGNOSIS — A413 Sepsis due to Hemophilus influenzae: Secondary | ICD-10-CM

## 2013-10-07 DIAGNOSIS — E43 Unspecified severe protein-calorie malnutrition: Secondary | ICD-10-CM

## 2013-10-07 DIAGNOSIS — J189 Pneumonia, unspecified organism: Secondary | ICD-10-CM

## 2013-10-07 DIAGNOSIS — B963 Hemophilus influenzae [H. influenzae] as the cause of diseases classified elsewhere: Secondary | ICD-10-CM | POA: Diagnosis present

## 2013-10-07 DIAGNOSIS — J441 Chronic obstructive pulmonary disease with (acute) exacerbation: Secondary | ICD-10-CM

## 2013-10-07 DIAGNOSIS — D72819 Decreased white blood cell count, unspecified: Secondary | ICD-10-CM

## 2013-10-07 HISTORY — DX: Gastro-esophageal reflux disease without esophagitis: K21.9

## 2013-10-07 HISTORY — DX: Personal history of other diseases of the digestive system: Z87.19

## 2013-10-07 LAB — CBC WITH DIFFERENTIAL/PLATELET
BASOS PCT: 0 % (ref 0–1)
Basophils Absolute: 0 10*3/uL (ref 0.0–0.1)
EOS PCT: 0 % (ref 0–5)
Eosinophils Absolute: 0 10*3/uL (ref 0.0–0.7)
HCT: 35.6 % — ABNORMAL LOW (ref 36.0–46.0)
Hemoglobin: 12.1 g/dL (ref 12.0–15.0)
LYMPHS PCT: 4 % — AB (ref 12–46)
Lymphs Abs: 0.5 10*3/uL — ABNORMAL LOW (ref 0.7–4.0)
MCH: 29.9 pg (ref 26.0–34.0)
MCHC: 34 g/dL (ref 30.0–36.0)
MCV: 87.9 fL (ref 78.0–100.0)
MONO ABS: 0.8 10*3/uL (ref 0.1–1.0)
MONOS PCT: 6 % (ref 3–12)
NEUTROS PCT: 90 % — AB (ref 43–77)
Neutro Abs: 12.2 10*3/uL — ABNORMAL HIGH (ref 1.7–7.7)
PLATELETS: 288 10*3/uL (ref 150–400)
RBC: 4.05 MIL/uL (ref 3.87–5.11)
RDW: 13.9 % (ref 11.5–15.5)
WBC MORPHOLOGY: INCREASED
WBC: 13.5 10*3/uL — AB (ref 4.0–10.5)

## 2013-10-07 LAB — URINALYSIS, ROUTINE W REFLEX MICROSCOPIC
Bilirubin Urine: NEGATIVE
GLUCOSE, UA: NEGATIVE mg/dL
KETONES UR: NEGATIVE mg/dL
Leukocytes, UA: NEGATIVE
NITRITE: NEGATIVE
PROTEIN: NEGATIVE mg/dL
Specific Gravity, Urine: 1.02 (ref 1.005–1.030)
Urobilinogen, UA: 1 mg/dL (ref 0.0–1.0)
pH: 5.5 (ref 5.0–8.0)

## 2013-10-07 LAB — I-STAT CHEM 8, ED
BUN: 31 mg/dL — ABNORMAL HIGH (ref 6–23)
CREATININE: 1.1 mg/dL (ref 0.50–1.10)
Calcium, Ion: 1.29 mmol/L (ref 1.13–1.30)
Chloride: 101 mEq/L (ref 96–112)
Glucose, Bld: 125 mg/dL — ABNORMAL HIGH (ref 70–99)
HCT: 35 % — ABNORMAL LOW (ref 36.0–46.0)
HEMOGLOBIN: 11.9 g/dL — AB (ref 12.0–15.0)
POTASSIUM: 2.9 meq/L — AB (ref 3.7–5.3)
SODIUM: 140 meq/L (ref 137–147)
TCO2: 24 mmol/L (ref 0–100)

## 2013-10-07 LAB — I-STAT TROPONIN, ED: TROPONIN I, POC: 0 ng/mL (ref 0.00–0.08)

## 2013-10-07 LAB — CBC
HEMATOCRIT: 34.3 % — AB (ref 36.0–46.0)
HEMOGLOBIN: 11.4 g/dL — AB (ref 12.0–15.0)
MCH: 29.5 pg (ref 26.0–34.0)
MCHC: 33.2 g/dL (ref 30.0–36.0)
MCV: 88.9 fL (ref 78.0–100.0)
Platelets: 294 10*3/uL (ref 150–400)
RBC: 3.86 MIL/uL — AB (ref 3.87–5.11)
RDW: 14 % (ref 11.5–15.5)
WBC: 10.3 10*3/uL (ref 4.0–10.5)

## 2013-10-07 LAB — URINE MICROSCOPIC-ADD ON

## 2013-10-07 LAB — CREATININE, SERUM
Creatinine, Ser: 1.06 mg/dL (ref 0.50–1.10)
GFR calc non Af Amer: 45 mL/min — ABNORMAL LOW (ref >90)
GFR, EST AFRICAN AMERICAN: 53 mL/min — AB (ref >90)

## 2013-10-07 LAB — I-STAT CG4 LACTIC ACID, ED: LACTIC ACID, VENOUS: 1.37 mmol/L (ref 0.5–2.2)

## 2013-10-07 MED ORDER — SODIUM CHLORIDE 0.9 % IJ SOLN
3.0000 mL | INTRAMUSCULAR | Status: DC | PRN
  Administered 2013-10-13: 3 mL via INTRAVENOUS

## 2013-10-07 MED ORDER — METHYLPREDNISOLONE SODIUM SUCC 40 MG IJ SOLR
40.0000 mg | Freq: Two times a day (BID) | INTRAMUSCULAR | Status: DC
  Administered 2013-10-07 – 2013-10-08 (×2): 40 mg via INTRAVENOUS
  Filled 2013-10-07 (×4): qty 1

## 2013-10-07 MED ORDER — OXYCODONE-ACETAMINOPHEN 5-325 MG PO TABS
1.0000 | ORAL_TABLET | Freq: Two times a day (BID) | ORAL | Status: DC | PRN
  Administered 2013-10-09 – 2013-10-17 (×9): 1 via ORAL
  Filled 2013-10-07 (×9): qty 1

## 2013-10-07 MED ORDER — POLYETHYLENE GLYCOL 3350 17 G PO PACK
17.0000 g | PACK | Freq: Two times a day (BID) | ORAL | Status: DC | PRN
  Administered 2013-10-13: 17 g via ORAL
  Filled 2013-10-07: qty 1

## 2013-10-07 MED ORDER — ATENOLOL 25 MG PO TABS
25.0000 mg | ORAL_TABLET | Freq: Two times a day (BID) | ORAL | Status: DC
  Filled 2013-10-07 (×3): qty 1

## 2013-10-07 MED ORDER — DEXTROSE 5 % IV SOLN
1.0000 g | INTRAVENOUS | Status: DC
  Administered 2013-10-07: 1 g via INTRAVENOUS
  Filled 2013-10-07 (×2): qty 10

## 2013-10-07 MED ORDER — LOSARTAN POTASSIUM 50 MG PO TABS
100.0000 mg | ORAL_TABLET | Freq: Every day | ORAL | Status: DC
  Administered 2013-10-07: 100 mg via ORAL
  Filled 2013-10-07 (×3): qty 2

## 2013-10-07 MED ORDER — POLYETHYLENE GLYCOL 3350 17 G PO PACK: 17.0000 g | PACK | Freq: Every day | ORAL | Status: DC | PRN

## 2013-10-07 MED ORDER — SIMVASTATIN 40 MG PO TABS
40.0000 mg | ORAL_TABLET | Freq: Every day | ORAL | Status: DC
  Administered 2013-10-08 – 2013-10-16 (×10): 40 mg via ORAL
  Filled 2013-10-07 (×12): qty 1

## 2013-10-07 MED ORDER — SODIUM CHLORIDE 0.9 % IV BOLUS (SEPSIS)
1000.0000 mL | Freq: Once | INTRAVENOUS | Status: AC
  Administered 2013-10-07: 1000 mL via INTRAVENOUS

## 2013-10-07 MED ORDER — VANCOMYCIN HCL IN DEXTROSE 1-5 GM/200ML-% IV SOLN
1000.0000 mg | Freq: Once | INTRAVENOUS | Status: AC
  Administered 2013-10-07: 1000 mg via INTRAVENOUS
  Filled 2013-10-07: qty 200

## 2013-10-07 MED ORDER — VANCOMYCIN HCL 500 MG IV SOLR: 500.0000 mg | INTRAVENOUS | Status: DC

## 2013-10-07 MED ORDER — AZITHROMYCIN 500 MG PO TABS
500.0000 mg | ORAL_TABLET | ORAL | Status: DC
  Administered 2013-10-07: 500 mg via ORAL
  Filled 2013-10-07 (×2): qty 1

## 2013-10-07 MED ORDER — ONDANSETRON HCL 4 MG PO TABS: 4.0000 mg | ORAL_TABLET | Freq: Four times a day (QID) | ORAL | Status: DC | PRN

## 2013-10-07 MED ORDER — ONDANSETRON HCL 4 MG/2ML IJ SOLN: 4.0000 mg | Freq: Four times a day (QID) | INTRAMUSCULAR | Status: DC | PRN

## 2013-10-07 MED ORDER — POTASSIUM CHLORIDE CRYS ER 20 MEQ PO TBCR
40.0000 meq | EXTENDED_RELEASE_TABLET | Freq: Two times a day (BID) | ORAL | Status: AC
  Administered 2013-10-08 (×2): 40 meq via ORAL
  Filled 2013-10-07 (×2): qty 2

## 2013-10-07 MED ORDER — ALLOPURINOL 300 MG PO TABS
450.0000 mg | ORAL_TABLET | Freq: Every day | ORAL | Status: DC
  Administered 2013-10-08 (×2): 450 mg via ORAL
  Filled 2013-10-07 (×3): qty 1

## 2013-10-07 MED ORDER — ACETAMINOPHEN 325 MG PO TABS: 650.0000 mg | ORAL_TABLET | Freq: Four times a day (QID) | ORAL | Status: DC | PRN

## 2013-10-07 MED ORDER — ALBUTEROL SULFATE (2.5 MG/3ML) 0.083% IN NEBU
2.5000 mg | INHALATION_SOLUTION | Freq: Four times a day (QID) | RESPIRATORY_TRACT | Status: DC | PRN
  Administered 2013-10-12: 2.5 mg via RESPIRATORY_TRACT
  Filled 2013-10-07 (×2): qty 3

## 2013-10-07 MED ORDER — DEXTROSE 5 % IV SOLN
1.0000 g | Freq: Once | INTRAVENOUS | Status: AC
  Administered 2013-10-07: 1 g via INTRAVENOUS
  Filled 2013-10-07: qty 1

## 2013-10-07 MED ORDER — POTASSIUM CHLORIDE CRYS ER 20 MEQ PO TBCR
40.0000 meq | EXTENDED_RELEASE_TABLET | Freq: Once | ORAL | Status: AC
  Administered 2013-10-07: 40 meq via ORAL
  Filled 2013-10-07: qty 2

## 2013-10-07 MED ORDER — POTASSIUM CHLORIDE 10 MEQ/100ML IV SOLN
10.0000 meq | Freq: Once | INTRAVENOUS | Status: AC
  Administered 2013-10-07: 10 meq via INTRAVENOUS
  Filled 2013-10-07: qty 100

## 2013-10-07 MED ORDER — ALBUTEROL SULFATE HFA 108 (90 BASE) MCG/ACT IN AERS: 2.0000 | INHALATION_SPRAY | Freq: Four times a day (QID) | RESPIRATORY_TRACT | Status: DC | PRN

## 2013-10-07 MED ORDER — HEPARIN SODIUM (PORCINE) 5000 UNIT/ML IJ SOLN
5000.0000 [IU] | Freq: Three times a day (TID) | INTRAMUSCULAR | Status: DC
  Administered 2013-10-07 – 2013-10-17 (×30): 5000 [IU] via SUBCUTANEOUS
  Filled 2013-10-07 (×34): qty 1

## 2013-10-07 MED ORDER — HYDRALAZINE HCL 10 MG PO TABS
10.0000 mg | ORAL_TABLET | Freq: Three times a day (TID) | ORAL | Status: DC
  Filled 2013-10-07 (×5): qty 1

## 2013-10-07 MED ORDER — SODIUM CHLORIDE 0.9 % IV SOLN: 250.0000 mL | INTRAVENOUS | Status: DC | PRN

## 2013-10-07 MED ORDER — AMLODIPINE BESYLATE 10 MG PO TABS
10.0000 mg | ORAL_TABLET | Freq: Every day | ORAL | Status: DC
  Administered 2013-10-07: 10 mg via ORAL
  Filled 2013-10-07 (×2): qty 1

## 2013-10-07 MED ORDER — ACETAMINOPHEN 650 MG RE SUPP: 650.0000 mg | Freq: Four times a day (QID) | RECTAL | Status: DC | PRN

## 2013-10-07 MED ORDER — SODIUM CHLORIDE 0.9 % IJ SOLN
3.0000 mL | Freq: Two times a day (BID) | INTRAMUSCULAR | Status: DC
Start: 2013-10-07 — End: 2013-10-17
  Administered 2013-10-08 – 2013-10-15 (×7): 3 mL via INTRAVENOUS

## 2013-10-07 NOTE — ED Notes (Signed)
Phlebotomy paged

## 2013-10-07 NOTE — ED Notes (Signed)
Lindsay BradfordKimberly with Pharmacy confirmed Vancomycin compatible with Potassium.

## 2013-10-07 NOTE — ED Provider Notes (Signed)
Medical screening examination/treatment/procedure(s) were performed by non-physician practitioner and as supervising physician I was immediately available for consultation/collaboration.  EKG Interpretation    Date/Time:  Friday October 07 2013 10:40:29 EST Ventricular Rate:  96 PR Interval:  157 QRS Duration: 93 QT Interval:  312 QTC Calculation: 394 R Axis:   23 Text Interpretation:  Sinus rhythm LVH with secondary repolarization abnormality Minimal ST elevation, inferior leads Artifact in lead(s) I II III aVR aVL aVF V1 V2 V3 V4 V5 V6 extesnive artifact Artifact Confirmed by Martavion Couper  MD, Kazia Grisanti ([id number]) on 10/07/2013 10:53:37 AM            Results for orders placed during the hospital encounter of 10/07/13  CBC WITH DIFFERENTIAL      Result Value Ref Range   WBC 13.5 (*) 4.0 - 10.5 K/uL   RBC 4.05  3.87 - 5.11 MIL/uL   Hemoglobin 12.1  12.0 - 15.0 g/dL   HCT 16.135.6 (*) 09.636.0 - 04.546.0 %   MCV 87.9  78.0 - 100.0 fL   MCH 29.9  26.0 - 34.0 pg   MCHC 34.0  30.0 - 36.0 g/dL   RDW 40.913.9  81.111.5 - 91.415.5 %   Platelets 288  150 - 400 K/uL   Neutrophils Relative % 90 (*) 43 - 77 %   Lymphocytes Relative 4 (*) 12 - 46 %   Monocytes Relative 6  3 - 12 %   Eosinophils Relative 0  0 - 5 %   Basophils Relative 0  0 - 1 %   Neutro Abs 12.2 (*) 1.7 - 7.7 K/uL   Lymphs Abs 0.5 (*) 0.7 - 4.0 K/uL   Monocytes Absolute 0.8  0.1 - 1.0 K/uL   Eosinophils Absolute 0.0  0.0 - 0.7 K/uL   Basophils Absolute 0.0  0.0 - 0.1 K/uL   WBC Morphology INCREASED BANDS (>20% BANDS)    I-STAT CHEM 8, ED      Result Value Ref Range   Sodium 140  137 - 147 mEq/L   Potassium 2.9 (*) 3.7 - 5.3 mEq/L   Chloride 101  96 - 112 mEq/L   BUN 31 (*) 6 - 23 mg/dL   Creatinine, Ser 7.821.10  0.50 - 1.10 mg/dL   Glucose, Bld 956125 (*) 70 - 99 mg/dL   Calcium, Ion 2.131.29  0.861.13 - 1.30 mmol/L   TCO2 24  0 - 100 mmol/L   Hemoglobin 11.9 (*) 12.0 - 15.0 g/dL   HCT 57.835.0 (*) 46.936.0 - 62.946.0 %  I-STAT TROPOININ, ED      Result Value Ref  Range   Troponin i, poc 0.00  0.00 - 0.08 ng/mL   Comment 3           I-STAT CG4 LACTIC ACID, ED      Result Value Ref Range   Lactic Acid, Venous 1.37  0.5 - 2.2 mmol/L   Dg Chest 2 View  10/07/2013   CLINICAL DATA:  Cough.  Chest congestion.  Chest pain.  EXAM: CHEST  2 VIEW  COMPARISON:  06/09/2013  FINDINGS: Heart size is normal. The left lung shows chronic scarring. There is a infiltrate and volume loss in the right lower lobe consistent with bronchopneumonia. Right upper lobe remains clear. No effusions. No significant bony finding.  IMPRESSION: Background pattern of chronic pulmonary scarring. Bronchopneumonia within the right lower lobe with consolidation and volume loss.   Electronically Signed   By: Paulina FusiMark  Shogry M.D.   On: 10/07/2013 11:58  Patient states seen by me. Patient clinical symptoms seem to be consistent with a bronchitis or pneumonia. X-ray confirms pneumonia. Patient with recent admission this will be a hospital-acquired pneumonia. Blood cultures done. Started on protocol antibiotics. Patient's room air sats were always above 90% in the low 90s. Patient a little bit somnolent and fatigued appearing. A little bit of tachypnea. Patient will be admitted.  Shelda Jakes, MD 10/07/13 [phone]

## 2013-10-07 NOTE — H&P (Signed)
[facility] History and Physical  Lindsay HammingCallie B [name] UJW:[id number]:[id number] DOB: 07/10/1923 DOA: 10/07/2013  Referring physician: Dr.Zackowski PCP: Lindsay GrippeKIM, [name], MD   Chief Complaint: Pleuritic chest pain  HPI: [name] is a 80 y.o. female  Past medical history of hypertension arthritis and gout recently treated for bronchitis, was brought to the emergency room pleuritic chest pain she said that after she started antibiotics she was feeling better but then 3 days prior to admission she started developing pleuritic chest pain cough sneezing and felt very weak. She stated home by herself was able to ambulate with difficulty and started to feel sleepy. She spent the last night prior to admission not able to sleep due to cough. She reports feeling some lightheadedness denies any fevers chills nausea vomiting or diarrhea.  In the ED: Basic metabolic panel was done that showed mild hypokalemia with a mild increase in her creatinine, a white count of 13 with an ANC of 12.2 chest x-ray showed right lower lobe consolidation.   Review of Systems:  Constitutional:  No weight loss, night sweats, Fevers, chills, fatigue.  HEENT:  No headaches, Difficulty swallowing,Tooth/dental problems,Sore throat,  No sneezing, itching, ear ache, nasal congestion, post nasal drip,  Cardio-vascular:  No chest pain, Orthopnea, PND, swelling in lower extremities, anasarca, dizziness, palpitations  GI:  No heartburn, indigestion, abdominal pain, nausea, vomiting, diarrhea, change in bowel habits, loss of appetite  Resp:  No coughing up of blood.No change in color of mucus.No wheezing.No chest wall deformity  Skin:  no rash or lesions.  GU:  no dysuria, change in color of urine, no urgency or frequency. No flank pain.  Musculoskeletal:  No joint pain or swelling. No decreased range of motion. No back pain.  Psych:  No change in mood or affect. No depression or anxiety. No memory loss.   Past Medical History    Diagnosis Date  . Hypertension   . Arthritis   . Gout    Past Surgical History  Procedure Laterality Date  . Appendectomy     Social History:  reports that she has quit smoking. She does not have any smokeless tobacco history on file. She reports that she does not drink alcohol or use illicit drugs.  Allergies  Allergen Reactions  . Penicillins Rash    Family History  Problem Relation Age of Onset  . Cancer Mother   . Stroke Father      Prior to Admission medications   Medication Sig Start Date End Date Taking? Authorizing Provider  amLODipine (NORVASC) 10 MG tablet Take 1 tablet (10 mg total) by mouth daily. 06/13/13  Yes Leroy SeaPrashant K Singh, MD  atenolol (TENORMIN) 25 MG tablet Take 25 mg by mouth 2 (two) times daily.   Yes Historical Provider, MD  hydrochlorothiazide (MICROZIDE) 12.5 MG capsule Take 12.5 mg by mouth daily.   Yes Historical Provider, MD  losartan (COZAAR) 100 MG tablet Take 100 mg by mouth daily.   Yes Historical Provider, MD  simvastatin (ZOCOR) 40 MG tablet Take 40 mg by mouth at bedtime.    Yes Historical Provider, MD  acetaminophen-codeine (TYLENOL #3) 300-30 MG per tablet Take 1 tablet by mouth every 6 (six) hours as needed (gout pain).     Historical Provider, MD  albuterol (PROVENTIL HFA;VENTOLIN HFA) 108 (90 BASE) MCG/ACT inhaler Inhale 2 puffs into the lungs every 6 (six) hours as needed for wheezing or shortness of breath.    Historical Provider, MD  albuterol (PROVENTIL) (5 MG/ML) 0.5% nebulizer  solution Take 0.5 mLs (2.5 mg total) by nebulization every 2 (two) hours as needed for wheezing. 06/13/13   Leroy Sea, MD  allopurinol (ZYLOPRIM) 300 MG tablet Take 450 mg by mouth at bedtime.     Historical Provider, MD  azithromycin (ZITHROMAX) 500 MG tablet Take 1 tablet (500 mg total) by mouth daily. 4 more days 06/13/13   Leroy Sea, MD  feeding supplement, RESOURCE BREEZE, (RESOURCE BREEZE) LIQD Take 1 Container by mouth 2 (two) times daily  between meals. 06/13/13   Leroy Sea, MD  hydrALAZINE (APRESOLINE) 10 MG tablet Take 1 tablet (10 mg total) by mouth every 8 (eight) hours. 06/14/13   Leroy Sea, MD  OVER THE COUNTER MEDICATION Place 1 drop into both eyes 2 (two) times daily as needed (dry eyes).    Historical Provider, MD  oxaprozin (DAYPRO) 600 MG tablet Take 1,200 mg by mouth daily as needed (gout).    Historical Provider, MD  oxyCODONE-acetaminophen (PERCOCET/ROXICET) 5-325 MG per tablet Take 1 tablet by mouth 2 (two) times daily. 12/06/12   Clydia Llano, MD  polyethylene glycol (MIRALAX / GLYCOLAX) packet Take 17 g by mouth 2 (two) times daily as needed (constipation). 06/13/13   Leroy Sea, MD   Physical Exam: Filed Vitals:   10/07/13 1330  BP: 116/61  Pulse: 97  Temp:   Resp: 16    BP 116/61  Pulse 97  Temp(Src) 98.4 F (36.9 C) (Oral)  Resp 16  Ht 5\' 5"  (1.651 m)  Wt 47.174 kg (104 lb)  BMI 17.31 kg/m2  SpO2 95%  General:  Appears calm and comfortable Eyes: PERRL, normal lids, irises & conjunctiva ENT: grossly normal hearing, lips & tongue Neck: no LAD, masses or thyromegaly + JVD Cardiovascular: RRR, no m/r/g. No LE edema. Telemetry: SR, no arrhythmias  Respiratory: Moderate air movement with rhonchi and wheezing bilaterally. Abdomen: soft, ntnd Skin: no rash or induration seen on limited exam Musculoskeletal: grossly normal tone  Neurologic: grossly non-focal. Generalized weakness.          Labs on Admission:  Basic Metabolic Panel:  Recent Labs Lab 10/07/13 1145  NA 140  K 2.9*  CL 101  GLUCOSE 125*  BUN 31*  CREATININE 1.10   Liver Function Tests: No results found for this basename: AST, ALT, ALKPHOS, BILITOT, PROT, ALBUMIN,  in the last 168 hours No results found for this basename: LIPASE, AMYLASE,  in the last 168 hours No results found for this basename: AMMONIA,  in the last 168 hours CBC:  Recent Labs Lab 10/07/13 1129 10/07/13 1145  WBC 13.5*  --     NEUTROABS 12.2*  --   HGB 12.1 11.9*  HCT 35.6* 35.0*  MCV 87.9  --   PLT 288  --    Cardiac Enzymes: No results found for this basename: CKTOTAL, CKMB, CKMBINDEX, TROPONINI,  in the last 168 hours  BNP (last 3 results) No results found for this basename: PROBNP,  in the last 8760 hours CBG: No results found for this basename: GLUCAP,  in the last 168 hours  Radiological Exams on Admission: Dg Chest 2 View  10/07/2013   CLINICAL DATA:  Cough.  Chest congestion.  Chest pain.  EXAM: CHEST  2 VIEW  COMPARISON:  06/09/2013  FINDINGS: Heart size is normal. The left lung shows chronic scarring. There is a infiltrate and volume loss in the right lower lobe consistent with bronchopneumonia. Right upper lobe remains clear. No effusions. No significant  bony finding.  IMPRESSION: Background pattern of chronic pulmonary scarring. Bronchopneumonia within the right lower lobe with consolidation and volume loss.   Electronically Signed   By: Paulina Fusi M.D.   On: 10/07/2013 11:58    EKG: Independently reviewed. Normal sinus rhythm with early repolchanges  Assessment/Plan  Acute respiratory failure due to Community acquired pneumonia/  COPD with exacerbation: - Sart IV rocephin and azithromycin. Sputum cultures. - Mild wheezing of physical exam start IV solumedrol, and inhalers. - Has JVD on physical exam check a 2-d echo. - KVO IV fluid, d/c HCTZ. - Cont atenolol.  Hypokalemia/HTN: - Unclear etiology - Check urine sodium and cr. - Hold diuretics, replete   Code Status: full Family Communication: none Disposition Plan: inpatient  Time spent: 70 minutes  Marinda Elk [facility] Pager [phone]

## 2013-10-07 NOTE — Progress Notes (Addendum)
ANTIBIOTIC CONSULT NOTE - INITIAL  Pharmacy Consult for vancomycin, renal adjust antibiotics, cefepime Indication: pneumonia  Allergies  Allergen Reactions  . Penicillins Rash    Patient Measurements: Height: 5\' 5"  (165.1 cm) Weight: 104 lb (47.174 kg) IBW/kg (Calculated) : 57   Vital Signs: Temp: 98.4 F (36.9 C) (02/20 1223) Temp src: Oral (02/20 1046) BP: 110/66 mmHg (02/20 1046) Pulse Rate: 88 (02/20 1119) Intake/Output from previous day:   Intake/Output from this shift:    Labs:  Recent Labs  10/07/13 1129 10/07/13 1145  WBC 13.5*  --   HGB 12.1 11.9*  PLT 288  --   CREATININE  --  1.10   Estimated Creatinine Clearance: 26.3 ml/min (by C-G formula based on Cr of 1.1). No results found for this basename: VANCOTROUGH, VANCOPEAK, VANCORANDOM, GENTTROUGH, GENTPEAK, GENTRANDOM, TOBRATROUGH, TOBRAPEAK, TOBRARND, AMIKACINPEAK, AMIKACINTROU, AMIKACIN,  in the last 72 hours   Microbiology: No results found for this or any previous visit (from the past 720 hour(s)).  Medical History: Past Medical History  Diagnosis Date  . Hypertension   . Arthritis   . Gout     Medications:  See med rec Assessment: 80 yo lady to start vancomycin for PNA.  Her CrCl ~26 ml/min.  Goal of Therapy:  Vancomycin trough level 15-20 mcg/ml  Plan:  Vancomycin 1000 mg IV X 1 then 500 mg IV q24 hours Cefepime 1gm IV q24 hours F/u renal function, clinical course and cultures  Seay, Lora Poteet 10/07/2013,12:38 PM   Addendum: Spoke with Dr. York SpanielBuriev who wants to continue vancomycin for at least one more day.  -Plan: -vancomcyin 500 mg IV q 24 hours -continue cefepime 1 g IV q 24 hours -F/u renal function, clinical course and cultures  Zai Chmiel Clinical Pharmacist-Resident

## 2013-10-07 NOTE — ED Provider Notes (Signed)
CSN: [id number]     Arrival date & time 10/07/13  1027 History   First MD Initiated Contact with Patient 10/07/13 1040     Chief Complaint  Patient presents with  . Chest Pain     (Consider location/radiation/quality/duration/timing/severity/associated sxs/prior Treatment) HPI  80 year old female with history of hypertension, arthritis, gout who was brought here via EMS from home for evaluation of pleuritic chest pain. Patient states she was treated with bronchitis and pneumonia in November of last year, which she was hospitalized for several days.. She says she has been doing well but for the past 3 days she has had pleuritic chest pain, productive cough, runny nose, sneezing, and feeling very tired and weak. She stayed at home by herself and usually able to ambulate but she was having difficulty sleeping last night due to her persistent coughing and no pleuritic chest pain. Reports feeling lightheadedness and dizziness. Denies fever but endorsed chills. Denies headache, diaphoresis, dyspnea on exertion, nausea vomiting diarrhea, abdominal pain, dysuria, or rash. Denies any numbness. No prior history of heart attack. Patient is a former smoker.  Past Medical History  Diagnosis Date  . Hypertension   . Arthritis   . Gout    Past Surgical History  Procedure Laterality Date  . Appendectomy     Family History  Problem Relation Age of Onset  . Cancer Mother   . Stroke Father    History  Substance Use Topics  . Smoking status: Former Games developer  . Smokeless tobacco: Not on file  . Alcohol Use: No   OB History   Grav Para Term Preterm Abortions TAB SAB Ect Mult Living                 Review of Systems  All other systems reviewed and are negative.      Allergies  Penicillins  Home Medications   Current Outpatient Rx  Name  Route  Sig  Dispense  Refill  . amLODipine (NORVASC) 10 MG tablet   Oral   Take 1 tablet (10 mg total) by mouth daily.         Marland Kitchen atenolol  (TENORMIN) 25 MG tablet   Oral   Take 25 mg by mouth 2 (two) times daily.         . hydrochlorothiazide (MICROZIDE) 12.5 MG capsule   Oral   Take 12.5 mg by mouth daily.         Marland Kitchen losartan (COZAAR) 100 MG tablet   Oral   Take 100 mg by mouth daily.         . simvastatin (ZOCOR) 40 MG tablet   Oral   Take 40 mg by mouth at bedtime.          Marland Kitchen acetaminophen-codeine (TYLENOL #3) 300-30 MG per tablet   Oral   Take 1 tablet by mouth every 6 (six) hours as needed (gout pain).          Marland Kitchen albuterol (PROVENTIL HFA;VENTOLIN HFA) 108 (90 BASE) MCG/ACT inhaler   Inhalation   Inhale 2 puffs into the lungs every 6 (six) hours as needed for wheezing or shortness of breath.         Marland Kitchen albuterol (PROVENTIL) (5 MG/ML) 0.5% nebulizer solution   Nebulization   Take 0.5 mLs (2.5 mg total) by nebulization every 2 (two) hours as needed for wheezing.   20 mL   12   . allopurinol (ZYLOPRIM) 300 MG tablet   Oral   Take 450 mg by mouth  at bedtime.          Marland Kitchen azithromycin (ZITHROMAX) 500 MG tablet   Oral   Take 1 tablet (500 mg total) by mouth daily. 4 more days      0   . feeding supplement, RESOURCE BREEZE, (RESOURCE BREEZE) LIQD   Oral   Take 1 Container by mouth 2 (two) times daily between meals.      0   . hydrALAZINE (APRESOLINE) 10 MG tablet   Oral   Take 1 tablet (10 mg total) by mouth every 8 (eight) hours.         Marland Kitchen OVER THE COUNTER MEDICATION   Both Eyes   Place 1 drop into both eyes 2 (two) times daily as needed (dry eyes).         Marland Kitchen oxaprozin (DAYPRO) 600 MG tablet   Oral   Take 1,200 mg by mouth daily as needed (gout).         Marland Kitchen oxyCODONE-acetaminophen (PERCOCET/ROXICET) 5-325 MG per tablet   Oral   Take 1 tablet by mouth 2 (two) times daily.   30 tablet   0   . polyethylene glycol (MIRALAX / GLYCOLAX) packet   Oral   Take 17 g by mouth 2 (two) times daily as needed (constipation).   14 each   0    BP 110/66  Pulse 94  Temp(Src) 98.8 F  (37.1 C) (Oral)  Resp 29  Ht 5\' 5"  (1.651 m)  Wt 104 lb (47.174 kg)  BMI 17.31 kg/m2  SpO2 96% Physical Exam  Nursing note and vitals reviewed. Constitutional: She is oriented to person, place, and time.  Frail-appearing female, lethargic, but arousable.  HENT:  Mouth/Throat: No oropharyngeal exudate.  Mouth is dry.  Eyes: Conjunctivae and EOM are normal. Pupils are equal, round, and reactive to light.  Neck: Neck supple. No JVD present.  Cardiovascular: Intact distal pulses.   Tachycardia without murmurs, rubs, or gallops  Pulmonary/Chest:  Tachypneic, with accessory muscle use. Bilateral pulmonary rales heard.  Abdominal: There is no tenderness.  Musculoskeletal: She exhibits no edema.  Bilateral lower extremities without palpable cord, erythema, edema, negative Homans sign.  Neurological: She is alert and oriented to person, place, and time.  Patient is drowsy but easily arousable and able to answer all questions appropriately. Globally weak but able to move all 4 extremities  Skin: No rash noted.  Psychiatric: She has a normal mood and affect.    ED Course  Procedures (including critical care time)  11:15 AM Patient with pleuritic chest pain and productive cough. Prior hospitalization for the acquired pneumonia in November. On exam patient does have rales heard at lung bases, in mild respiratory distress. Workup initiated.  Care discussed with Dr. Deretha Emory.  12:01 PM Patient's left remarkable for a potassium of 2.9. She is currently on diuretic which may contribute to hypokalemia. EKG without U-wave.  Supplementation given.    12:11 PM Chest x-ray shows evidence of bronchopneumonia within right lower lobe. Since pt was recently hospitalized, we'll treat for hospital-acquired pneumonia with cefepime and and per protocol. Blood cultures ordered. Will admit for further management  1:39 PM I have consulted with [facility], Dr. Robb Matar who will admit to med surg, team  10, under his care.    Labs Review Labs Reviewed  CBC WITH DIFFERENTIAL - Abnormal; Notable for the following:    WBC 13.5 (*)    HCT 35.6 (*)    Neutrophils Relative % 90 (*)  Lymphocytes Relative 4 (*)    Neutro Abs 12.2 (*)    Lymphs Abs 0.5 (*)    All other components within normal limits  I-STAT CHEM 8, ED - Abnormal; Notable for the following:    Potassium 2.9 (*)    BUN 31 (*)    Glucose, Bld 125 (*)    Hemoglobin 11.9 (*)    HCT 35.0 (*)    All other components within normal limits  CULTURE, BLOOD (ROUTINE X 2)  CULTURE, BLOOD (ROUTINE X 2)  URINALYSIS, ROUTINE W REFLEX MICROSCOPIC  I-STAT TROPOININ, ED  I-STAT CG4 LACTIC ACID, ED   Imaging Review Dg Chest 2 View  10/07/2013   CLINICAL DATA:  Cough.  Chest congestion.  Chest pain.  EXAM: CHEST  2 VIEW  COMPARISON:  06/09/2013  FINDINGS: Heart size is normal. The left lung shows chronic scarring. There is a infiltrate and volume loss in the right lower lobe consistent with bronchopneumonia. Right upper lobe remains clear. No effusions. No significant bony finding.  IMPRESSION: Background pattern of chronic pulmonary scarring. Bronchopneumonia within the right lower lobe with consolidation and volume loss.   Electronically Signed   By: Paulina FusiMark  Shogry M.D.   On: 10/07/2013 11:58    EKG Interpretation    Date/Time:  Friday October 07 2013 10:40:29 EST Ventricular Rate:  96 PR Interval:  157 QRS Duration: 93 QT Interval:  312 QTC Calculation: 394 R Axis:   23 Text Interpretation:  Sinus rhythm LVH with secondary repolarization abnormality Minimal ST elevation, inferior leads Artifact in lead(s) I II III aVR aVL aVF V1 V2 V3 V4 V5 V6 extesnive artifact Artifact Confirmed by ZACKOWSKI  MD, SCOTT ([id number]) on 10/07/2013 10:53:37 AM            MDM   Final diagnoses:  HCAP (healthcare-associated pneumonia)  Hypokalemia    BP 124/65  Pulse 97  Temp(Src) 98.4 F (36.9 C) (Oral)  Resp 39  Ht 5\' 5"  (1.651 m)  Wt  104 lb (47.174 kg)  BMI 17.31 kg/m2  SpO2 96%  I have reviewed nursing notes and vital signs. I personally reviewed the imaging tests through PACS system  I reviewed available ER/hospitalization records thought the EMR      Fayrene HelperBowie Dellanira Dillow, [place] JerseyPA-C 10/07/13 1348

## 2013-10-07 NOTE — ED Notes (Signed)
Pt presents via [facility] from home with c/o of chest pain x 2 hours that mainly occurs during coughing. Pt recently treated for Bronchitis and Pneumonia.  Pt O2 of 94% on room air.  Pt has bilateral rhonchi.

## 2013-10-07 NOTE — Progress Notes (Signed)
Sputum cup left with pt. Pt does not feel she cant produce sputum at this time. Explained to pt that if able to produce sputum to notify RN.

## 2013-10-08 DIAGNOSIS — M109 Gout, unspecified: Secondary | ICD-10-CM

## 2013-10-08 DIAGNOSIS — I369 Nonrheumatic tricuspid valve disorder, unspecified: Secondary | ICD-10-CM

## 2013-10-08 DIAGNOSIS — R0602 Shortness of breath: Secondary | ICD-10-CM

## 2013-10-08 LAB — INFLUENZA PANEL BY PCR (TYPE A & B)
H1N1 flu by pcr: NOT DETECTED
Influenza A By PCR: NEGATIVE
Influenza B By PCR: NEGATIVE

## 2013-10-08 LAB — COMPREHENSIVE METABOLIC PANEL
ALBUMIN: 2.4 g/dL — AB (ref 3.5–5.2)
ALK PHOS: 63 U/L (ref 39–117)
ALT: 14 U/L (ref 0–35)
AST: 25 U/L (ref 0–37)
BILIRUBIN TOTAL: 0.6 mg/dL (ref 0.3–1.2)
BUN: 36 mg/dL — ABNORMAL HIGH (ref 6–23)
CHLORIDE: 103 meq/L (ref 96–112)
CO2: 23 mEq/L (ref 19–32)
Calcium: 9.1 mg/dL (ref 8.4–10.5)
Creatinine, Ser: 1.17 mg/dL — ABNORMAL HIGH (ref 0.50–1.10)
GFR calc Af Amer: 47 mL/min — ABNORMAL LOW (ref >90)
GFR calc non Af Amer: 40 mL/min — ABNORMAL LOW (ref >90)
Glucose, Bld: 110 mg/dL — ABNORMAL HIGH (ref 70–99)
Potassium: 4.1 mEq/L (ref 3.7–5.3)
Sodium: 139 mEq/L (ref 137–147)
Total Protein: 5.7 g/dL — ABNORMAL LOW (ref 6.0–8.3)

## 2013-10-08 LAB — CBC
HCT: 31.3 % — ABNORMAL LOW (ref 36.0–46.0)
Hemoglobin: 10.4 g/dL — ABNORMAL LOW (ref 12.0–15.0)
MCH: 29.4 pg (ref 26.0–34.0)
MCHC: 33.2 g/dL (ref 30.0–36.0)
MCV: 88.4 fL (ref 78.0–100.0)
Platelets: 269 10*3/uL (ref 150–400)
RBC: 3.54 MIL/uL — AB (ref 3.87–5.11)
RDW: 13.9 % (ref 11.5–15.5)
WBC: 16.4 10*3/uL — ABNORMAL HIGH (ref 4.0–10.5)

## 2013-10-08 LAB — PRO B NATRIURETIC PEPTIDE: PRO B NATRI PEPTIDE: 4063 pg/mL — AB (ref 0–450)

## 2013-10-08 LAB — HIV ANTIBODY (ROUTINE TESTING W REFLEX): HIV: NONREACTIVE

## 2013-10-08 MED ORDER — SODIUM CHLORIDE 0.9 % IV BOLUS (SEPSIS)
1000.0000 mL | Freq: Once | INTRAVENOUS | Status: AC
  Administered 2013-10-08: 1000 mL via INTRAVENOUS

## 2013-10-08 MED ORDER — SODIUM CHLORIDE 0.9 % IV SOLN: 250.0000 mL | INTRAVENOUS | Status: DC | PRN

## 2013-10-08 MED ORDER — SODIUM CHLORIDE 0.9 % IV BOLUS (SEPSIS)
250.0000 mL | INTRAVENOUS | Status: DC | PRN
  Administered 2013-10-08: 250 mL via INTRAVENOUS

## 2013-10-08 MED ORDER — VANCOMYCIN HCL 500 MG IV SOLR
500.0000 mg | INTRAVENOUS | Status: DC
  Administered 2013-10-08: 500 mg via INTRAVENOUS
  Filled 2013-10-08 (×3): qty 500

## 2013-10-08 MED ORDER — STARCH (THICKENING) PO POWD
ORAL | Status: DC | PRN
  Filled 2013-10-08: qty 227

## 2013-10-08 MED ORDER — POTASSIUM CHLORIDE IN NACL 20-0.9 MEQ/L-% IV SOLN
INTRAVENOUS | Status: DC
  Administered 2013-10-08: 18:00:00 via INTRAVENOUS
  Administered 2013-10-09 (×2): 1000 mL via INTRAVENOUS
  Filled 2013-10-08 (×5): qty 1000

## 2013-10-08 MED ORDER — ATENOLOL 12.5 MG HALF TABLET
12.5000 mg | ORAL_TABLET | Freq: Every day | ORAL | Status: DC
  Administered 2013-10-09 – 2013-10-17 (×9): 12.5 mg via ORAL
  Filled 2013-10-08 (×10): qty 1

## 2013-10-08 MED ORDER — DEXTROSE 5 % IV SOLN
1.0000 g | INTRAVENOUS | Status: DC
  Administered 2013-10-08 – 2013-10-10 (×3): 1 g via INTRAVENOUS
  Filled 2013-10-08 (×3): qty 1

## 2013-10-08 MED ORDER — SODIUM CHLORIDE 0.9 % IV BOLUS (SEPSIS)
250.0000 mL | Freq: Once | INTRAVENOUS | Status: AC
  Administered 2013-10-08: 250 mL via INTRAVENOUS

## 2013-10-08 NOTE — Progress Notes (Signed)
Physical Therapy Evaluation Patient Details Name: [name] MRN: [id number] DOB: [birth year] Today's Date: 10/08/2013 Time: 8341-9622 PT Time Calculation (min): 18 min  PT Assessment / Plan / Recommendation History of Present Illness  Patient is an 80 yo female with medical history of hypertension arthritis and gout. She was recently treated for bronchitis, was brought to the emergency room pleuritic chest pain. She said that after she started antibiotics she was feeling better but then 3 days prior to admission she started developing pleuritic chest pain cough sneezing and felt very weak.  Patient admitted with CAP, COPD exacerbation, hypokalcemia.  Clinical Impression  Patient presents with problems listed below.  Will benefit from acute PT to maximize independence prior to discharge.  Patient very weak, with DOE of 4/4.  Patient lives alone.  Recommend SNF for continued therapy at discharge.    PT Assessment  Patient needs continued PT services    Follow Up Recommendations  SNF;Supervision/Assistance - 24 hour    Does the patient have the potential to tolerate intense rehabilitation      Barriers to Discharge Decreased caregiver support;Inaccessible home environment Patient lives alone.  Has 5 steps to enter apartment.    Equipment Recommendations  None recommended by PT    Recommendations for Other Services     Frequency Min 3X/week    Precautions / Restrictions Precautions Precautions: Fall Restrictions Weight Bearing Restrictions: No   Pertinent Vitals/Pain       Mobility  Transfers Overall transfer level: Needs assistance Equipment used: 2 person hand held assist Transfers: Sit to/from Stand Sit to Stand: Min assist;+2 physical assistance General transfer comment: Verbal cues for hand placement.  Assist to rise to standing and for balance.  In standing, patient with decreased balance, and increased dyspnea of 4/4.  Returned to sitting.  Remained on O2 during  session.    Exercises     PT Diagnosis: Difficulty walking;Generalized weakness  PT Problem List: Decreased strength;Decreased activity tolerance;Decreased balance;Decreased mobility;Decreased knowledge of use of DME;Cardiopulmonary status limiting activity PT Treatment Interventions: DME instruction;Gait training;Functional mobility training;Balance training;Patient/family education     PT Goals(Current goals can be found in the care plan section) Acute Rehab PT Goals Patient Stated Goal: To get better and go home PT Goal Formulation: With patient Time For Goal Achievement: 10/22/13 Potential to Achieve Goals: Good  Visit Information  Last PT Received On: 10/08/13 Assistance Needed: +1 History of Present Illness: Patient is an 80 yo female with medical history of hypertension arthritis and gout. She was recently treated for bronchitis, was brought to the emergency room pleuritic chest pain. She said that after she started antibiotics she was feeling better but then 3 days prior to admission she started developing pleuritic chest pain cough sneezing and felt very weak.  Patient admitted with CAP, COPD exacerbation, hypokalcemia.       Prior Functioning  Home Living Family/patient expects to be discharged to:: Skilled nursing facility Living Arrangements: Alone Available Help at Discharge: Family;Available PRN/intermittently Type of Home: Apartment Home Access: Stairs to enter Entrance Stairs-Number of Steps: 5 Entrance Stairs-Rails: Right;Left Home Layout: One level Home Equipment: Walker - 2 wheels;Cane - single point Prior Function Level of Independence: Independent Communication Communication: No difficulties Dominant Hand: Right    Cognition  Cognition Arousal/Alertness: Awake/alert Behavior During Therapy: WFL for tasks assessed/performed Overall Cognitive Status: Within Functional Limits for tasks assessed    Extremity/Trunk Assessment Upper Extremity  Assessment Upper Extremity Assessment: Generalized weakness Lower Extremity Assessment Lower Extremity Assessment:  Generalized weakness   Balance Balance Overall balance assessment: Needs assistance Standing balance support: Single extremity supported Standing balance-Leahy Scale: Fair Standing balance comment: Able to stand for only 45-60 seconds.  Noted increased dyspnea - returned to sitting.  End of Session PT - End of Session Equipment Utilized During Treatment: Gait belt;Oxygen Activity Tolerance: Patient limited by fatigue Patient left: in chair;with call bell/phone within reach Nurse Communication: Mobility status  GP     Vena AustriaDavis, Arnie Clingenpeel H 10/08/2013, 4:03 PM Durenda HurtSusan H. Renaldo Fiddleravis, PT, [facility]BA Acute Rehab Services Pager [phone]

## 2013-10-08 NOTE — Progress Notes (Signed)
BP 82 systolic , 40-56 diastolic, HR 85 -87, A&ox4, no complaints.  MD notified, NS bolus of 250cc NSx2 given.  Blood culture both positive for gm negative cocci bacilli both on aerobic and nonaerobic bottles.  Will move to step down.

## 2013-10-08 NOTE — Evaluation (Signed)
Clinical/Bedside Swallow Evaluation Patient Details  Name: [name] MRN: [id number] Date of Birth: 07/21/1923  Today's Date: 10/08/2013 Time: [id number] SLP Time Calculation (min): 12 min  Past Medical History:  Past Medical History  Diagnosis Date  . Hypertension   . Arthritis   . Gout    Past Surgical History:  Past Surgical History  Procedure Laterality Date  . Appendectomy     HPI:  80 y.o. female past medical history of hypertension arthritis and gout recently treated for bronchitis, repeat pna was brought to the emergency room pleuritic chest pain she said that after she started antibiotics she was feeling better but then 3 days prior to admission she started developing pleuritic chest pain cough sneezing and felt very weak.  CXR Background pattern of chronic pulmonary scarring. Bronchopneumonia within the right lower lobe with consolidation and volume loss.  Pt. states she "gets strangled during meals every day."   Assessment / Plan / Recommendation Clinical Impression  No overt indications of oropharyngeal swallow dysfunction present.  Oral phase appeared mildly delayed in manipulation, mastication and transit.  Given pt.'s repeated pna and reports of frequent difficulty swallowing prior to admission, recommend MBS which will likely be performed 2/23 (2/22 if able).  Diet modified to Dys 3 texture and nectar thick liquids, no straws, pills whole in applesauce.    Aspiration Risk  Moderate    Diet Recommendation Dysphagia 3 (Mechanical Soft);Nectar-thick liquid   Liquid Administration via: Cup;No straw Medication Administration: Whole meds with puree Supervision: Patient able to self feed Compensations: Slow rate;Small sips/bites Postural Changes and/or Swallow Maneuvers: Seated upright 90 degrees    Other  Recommendations Recommended Consults: MBS Oral Care Recommendations: Oral care BID   Follow Up Recommendations   (TBD)    Frequency and Duration min 2x/week   2 weeks   Pertinent Vitals/Pain WDL         Swallow Study         Oral/Motor/Sensory Function Overall Oral Motor/Sensory Function: Appears within functional limits for tasks assessed   Ice Chips Ice chips: Not tested   Thin Liquid Thin Liquid: Within functional limits    Nectar Thick Nectar Thick Liquid: Not tested   Honey Thick Honey Thick Liquid: Not tested   Puree Puree: Within functional limits   Solid   GO    Solid: Impaired Oral Phase Impairments: Impaired mastication Oral Phase Functional Implications:  (slightly prolonged oral phase)       Darrow BussingLisa Willis Ayo Smoak M.Ed [employer]-SLP Pager [phone]  10/08/2013

## 2013-10-08 NOTE — Progress Notes (Addendum)
[facility] PROGRESS NOTE  Lindsay HammingCallie B [name] NWG:[id number] DOB: 08/12/1923 DOA: 10/07/2013 PCP: [name], [name], MD  Assessment/Plan: [age] y/o female with PMH of HTN, HPL, chronic bronchitis, gout,  presented with SOB, cough, fever fount to have pneumonia    1. Pneumonia/sirs/sepsis; CXR: Bronchopneumonia within the right lower lobe; h/o recurrent PNA -started on IV atx, bronchodilators, oxygen; pend cultures, influenza   2. Chronic bronchitis; probable underlying COPD -cont inhaled bronchodilators; oxygen  3. HTN, currently hypotensive hold amlodipine, hydralazine, losartan, cont BB, monitor    4. Hypo K resolved after supplementation    Addendum: blood c/s; + GRAM NEGATIVE COCCOBACILLI -cont IVF resuscitation; cont IV atx; TF to SDU for close monitoring; may need pressure support   Code Status: full Family Communication:  D/w patient, called [name],[name] Sister [phone] no answer try, later  (indicate person spoken with, relationship, and if by phone, the number) Disposition Plan: pend PT eval    Consultants:  None   Procedures:  None   Antibiotics:  Cefepime 2/20<<<<  vanc 2/20 <<<<   (indicate start date, and stop date if known)  HPI/Subjective: alert  Objective: Filed Vitals:   10/08/13 0517  BP: 89/45  Pulse: 85  Temp: 98.3 F (36.8 C)  Resp: 20    Intake/Output Summary (Last 24 hours) at 10/08/13 0951 Last data filed at 10/08/13 0900  Gross per 24 hour  Intake    600 ml  Output    100 ml  Net    500 ml   Filed Weights   10/07/13 1041  Weight: 47.174 kg (104 lb)    Exam:   General:  alert  Cardiovascular: s1,s2 rrr  Respiratory: R LL rales   Abdomen: soft, nt, nd   Musculoskeletal: no LE edema   Data Reviewed: Basic Metabolic Panel:  Recent Labs Lab 10/07/13 1145 10/07/13 1859 10/08/13 0457  NA 140  --  139  K 2.9*  --  4.1  CL 101  --  103  CO2  --   --  23  GLUCOSE 125*  --  110*  BUN 31*  --  36*  CREATININE  1.10 1.06 1.17*  CALCIUM  --   --  9.1   Liver Function Tests:  Recent Labs Lab 10/08/13 0457  AST 25  ALT 14  ALKPHOS 63  BILITOT 0.6  PROT 5.7*  ALBUMIN 2.4*   No results found for this basename: LIPASE, AMYLASE,  in the last 168 hours No results found for this basename: AMMONIA,  in the last 168 hours CBC:  Recent Labs Lab 10/07/13 1129 10/07/13 1145 10/07/13 1859 10/08/13 0457  WBC 13.5*  --  10.3 16.4*  NEUTROABS 12.2*  --   --   --   HGB 12.1 11.9* 11.4* 10.4*  HCT 35.6* 35.0* 34.3* 31.3*  MCV 87.9  --  88.9 88.4  PLT 288  --  294 269   Cardiac Enzymes: No results found for this basename: CKTOTAL, CKMB, CKMBINDEX, TROPONINI,  in the last 168 hours BNP (last 3 results) No results found for this basename: PROBNP,  in the last 8760 hours CBG: No results found for this basename: GLUCAP,  in the last 168 hours  No results found for this or any previous visit (from the past 240 hour(s)).   Studies: Dg Chest 2 View  10/07/2013   CLINICAL DATA:  Cough.  Chest congestion.  Chest pain.  EXAM: CHEST  2 VIEW  COMPARISON:  06/09/2013  FINDINGS: Heart size is normal.  The left lung shows chronic scarring. There is a infiltrate and volume loss in the right lower lobe consistent with bronchopneumonia. Right upper lobe remains clear. No effusions. No significant bony finding.  IMPRESSION: Background pattern of chronic pulmonary scarring. Bronchopneumonia within the right lower lobe with consolidation and volume loss.   Electronically Signed   By: Paulina Fusi M.D.   On: 10/07/2013 11:58    Scheduled Meds: . allopurinol  450 mg Oral QHS  . amLODipine  10 mg Oral Daily  . atenolol  25 mg Oral BID  . azithromycin  500 mg Oral Q24H  . cefTRIAXone (ROCEPHIN)  IV  1 g Intravenous Q24H  . heparin  5,000 Units Subcutaneous 3 times per day  . hydrALAZINE  10 mg Oral 3 times per day  . losartan  100 mg Oral Daily  . methylPREDNISolone (SOLU-MEDROL) injection  40 mg Intravenous Q12H   . potassium chloride  40 mEq Oral BID  . simvastatin  40 mg Oral QHS  . sodium chloride  3 mL Intravenous Q12H   Continuous Infusions:   Active Problems:   CAP (community acquired pneumonia)   Community acquired pneumonia   Acute respiratory failure   Hypokalemia   COPD with exacerbation    Time spent: >35 minutes     Esperanza Sheets  [facility] Pager [phone]. If 7PM-7AM, please contact night-coverage at [email or web address], password [facility] 10/08/2013, 9:51 AM  LOS: 1 day

## 2013-10-08 NOTE — Progress Notes (Signed)
Echocardiogram 2D Echocardiogram has been performed.  Dorothey BasemanReel, Shantara Goosby M 10/08/2013, 3:45 PM

## 2013-10-09 ENCOUNTER — Inpatient Hospital Stay (HOSPITAL_COMMUNITY): Payer: Medicare HMO

## 2013-10-09 DIAGNOSIS — R7881 Bacteremia: Secondary | ICD-10-CM

## 2013-10-09 DIAGNOSIS — D72819 Decreased white blood cell count, unspecified: Secondary | ICD-10-CM

## 2013-10-09 DIAGNOSIS — A419 Sepsis, unspecified organism: Secondary | ICD-10-CM

## 2013-10-09 LAB — LEGIONELLA ANTIGEN, URINE: Legionella Antigen, Urine: NEGATIVE

## 2013-10-09 LAB — CBC
HCT: 33.7 % — ABNORMAL LOW (ref 36.0–46.0)
HEMOGLOBIN: 11.3 g/dL — AB (ref 12.0–15.0)
MCH: 29.8 pg (ref 26.0–34.0)
MCHC: 33.5 g/dL (ref 30.0–36.0)
MCV: 88.9 fL (ref 78.0–100.0)
Platelets: 281 10*3/uL (ref 150–400)
RBC: 3.79 MIL/uL — AB (ref 3.87–5.11)
RDW: 14.2 % (ref 11.5–15.5)
WBC: 21.8 10*3/uL — ABNORMAL HIGH (ref 4.0–10.5)

## 2013-10-09 LAB — BASIC METABOLIC PANEL
BUN: 34 mg/dL — ABNORMAL HIGH (ref 6–23)
CALCIUM: 9 mg/dL (ref 8.4–10.5)
CO2: 20 meq/L (ref 19–32)
Chloride: 108 mEq/L (ref 96–112)
Creatinine, Ser: 0.93 mg/dL (ref 0.50–1.10)
GFR calc Af Amer: 62 mL/min — ABNORMAL LOW (ref >90)
GFR, EST NON AFRICAN AMERICAN: 53 mL/min — AB (ref >90)
GLUCOSE: 131 mg/dL — AB (ref 70–99)
POTASSIUM: 4.6 meq/L (ref 3.7–5.3)
Sodium: 139 mEq/L (ref 137–147)

## 2013-10-09 MED ORDER — ALLOPURINOL 150 MG HALF TABLET
150.0000 mg | ORAL_TABLET | Freq: Every day | ORAL | Status: DC
  Administered 2013-10-09 – 2013-10-16 (×8): 150 mg via ORAL
  Filled 2013-10-09 (×10): qty 1

## 2013-10-09 MED ORDER — LEVOFLOXACIN IN D5W 750 MG/150ML IV SOLN
750.0000 mg | INTRAVENOUS | Status: DC
  Administered 2013-10-09: 750 mg via INTRAVENOUS
  Filled 2013-10-09: qty 150

## 2013-10-09 NOTE — Procedures (Signed)
Objective Swallowing Evaluation: Modified Barium Swallowing Study  Patient Details  Name: [name] MRN: [id number] Date of Birth: Oct 22, 1922  Today's Date: 10/09/2013 Time: 1530-1600 SLP Time Calculation (min): 30 min  Past Medical History:  Past Medical History  Diagnosis Date  . Hypertension   . Arthritis   . Gout    Past Surgical History:  Past Surgical History  Procedure Laterality Date  . Appendectomy     HPI:  80 y/o female with PMH significant for HTN, arthritis, gout, bronchitis, and repeat PNA.  Brought to ED with pleuritic chest pain.  CXR: background pattern of chronic pulmonary scarring.  Bronchopneumonia within right lower lobe with consolidation and volume loss. Patient reports dysphagia as "getting strangled during meals every day.  MBS indicated from BSE to assess risk for aspiration due to repeated PNA.       Assessment / Plan / Recommendation Clinical Impression  Dysphagia Diagnosis: Mild oral phase dysphagia;Mild pharyngeal phase dysphagia;Mild cervical esophageal phase dysphagia.  Oral phase marked by piecemeal swallows and delayed oral transit with all liquid and solid consistencies.   Mild to moderate pharyngeal phase dysphagia with esophageal involvement.   All swallows triggered at valleculae with immediate backflow to pyriforms s/p swallow of thin, nectar, and puree consistencies.  During second swallow, backflow,from pyriforms spill in airway resulting in trace silent penetration during swallow with eventual trace aspiration with thin liquid barium by spoon, cup, and straw.  Cued throat clear and cough cleared majority of penetrates and aspirated material.  Chin tuck not effective in eliminating penetration/aspiration.  Minimal residuals in vallecular space and pyriforms s/p swallow of thin, nectar, and puree consistency. Strategies of multiple swallows effective in clearing residuals.   Appears to be osteophytes at C3 and C6 no radiologist present to  confirm.  Prominent CP muscle noted.  Brief esophageal screen indicates slow bolus transit with backflow to cervical esophagus noted.  Containment of whole barium tablet noted at LES.  Recommend to continue current diet consistency of dysphagia 3 and nectar thick liquids with full supervision with all meals.  Recommend aspiration and reflux precautions as risk remains s/p swallow due to noted backflow.  ST to continue in acute care setting for diet tolerance.  Recommend Skilled ST at next level of care.  Repeat MBS with clinical improvement prior to liquid upgrade as aspiration events silent.  May benefit from GI consult to further assess esophageal functioning.      Treatment Recommendation  Therapy as outlined in treatment plan below    Diet Recommendation Dysphagia 3 (Mechanical Soft);Nectar-thick liquid   Liquid Administration via: Cup;No straw Medication Administration: Crushed with puree Supervision: Patient able to self feed;Full supervision/cueing for compensatory strategies Compensations: Slow rate;Small sips/bites;Effortful swallow;Multiple dry swallows after each bite/sip;Clear throat intermittently Postural Changes and/or Swallow Maneuvers: Seated upright 90 degrees;Upright 30-60 min after meal    Other  Recommendations Recommended Consults: Consider GI evaluation Oral Care Recommendations: Oral care Q4 per protocol Other Recommendations: Order thickener from pharmacy;Prohibited food (jello, ice cream, thin soups);Remove water pitcher;Clarify dietary restrictions   Follow Up Recommendations  Home health SLP    Frequency and Duration min 2x/week  2 weeks       SLP Swallow Goals     General Date of Onset: 10/07/13 HPI: 80 y/o female with PMH significant for HTN, arthritis, gout, bronchitis, and repeat PNA.  Brought to ED with pleuritic chest pain.  CXR: background pattern of chronic pulmonary scarring.  Bronchopneumonia within right lower lobe with consolidation  and volume  loss. Patient reports dysphagia as "getting strangled during meals every day.  MBS indicated from BSE to assess risk for aspiration due to repeated PNA.   Type of Study: Modified Barium Swallowing Study Reason for Referral: Objectively evaluate swallowing function Previous Swallow Assessment: BSE 10/08/13 Diet Prior to this Study: Dysphagia 3 (soft);Nectar-thick liquids Temperature Spikes Noted: No Respiratory Status: Nasal cannula History of Recent Intubation: No Behavior/Cognition: Alert;Cooperative;Pleasant mood Oral Cavity - Dentition: Missing dentition Oral Motor / Sensory Function: Impaired - see Bedside swallow eval Self-Feeding Abilities: Able to feed self Patient Positioning: Upright in chair Baseline Vocal Quality: Clear Volitional Cough: Strong Volitional Swallow: Able to elicit Pharyngeal Secretions: Not observed secondary MBS    Reason for Referral Objectively evaluate swallowing function   Oral Phase Oral Preparation/Oral Phase Oral Phase: Impaired Oral - Nectar Oral - Nectar Teaspoon: Reduced posterior propulsion;Decreased velopharyngeal closure;Piecemeal swallowing;Delayed oral transit Oral - Nectar Cup: Piecemeal swallowing;Reduced posterior propulsion;Decreased velopharyngeal closure;Delayed oral transit Oral - Thin Oral - Thin Teaspoon: Incomplete tongue to palate contact;Piecemeal swallowing;Reduced posterior propulsion;Decreased velopharyngeal closure;Delayed oral transit Oral - Solids Oral - Puree: Piecemeal swallowing;Incomplete tongue to palate contact;Decreased velopharyngeal closure Oral - Mechanical Soft: Piecemeal swallowing;Decreased velopharyngeal closure;Reduced posterior propulsion;Delayed oral transit   Pharyngeal Phase Pharyngeal Phase Pharyngeal Phase: Impaired Pharyngeal - Nectar Pharyngeal - Nectar Teaspoon: Reduced pharyngeal peristalsis;Reduced anterior laryngeal mobility;Reduced laryngeal elevation;Premature spillage to valleculae;Reduced  airway/laryngeal closure;Reduced tongue base retraction;Pharyngeal residue - pyriform sinuses;Pharyngeal residue - valleculae;Pharyngeal residue - cp segment Pharyngeal - Nectar Cup: Reduced tongue base retraction;Reduced pharyngeal peristalsis;Pharyngeal residue - cp segment;Reduced anterior laryngeal mobility;Premature spillage to valleculae;Reduced airway/laryngeal closure;Reduced laryngeal elevation Pharyngeal - Thin Pharyngeal - Thin Teaspoon: Reduced pharyngeal peristalsis;Reduced tongue base retraction;Penetration/Aspiration during swallow;Penetration/Aspiration after swallow;Reduced anterior laryngeal mobility;Reduced laryngeal elevation;Premature spillage to valleculae;Reduced airway/laryngeal closure;Trace aspiration;Pharyngeal residue - pyriform sinuses Penetration/Aspiration details (thin teaspoon): Material enters airway, remains ABOVE vocal cords and not ejected out;Material enters airway, passes BELOW cords without attempt by patient to eject out (silent aspiration) Pharyngeal - Thin Cup: Reduced pharyngeal peristalsis;Reduced tongue base retraction;Penetration/Aspiration during swallow;Reduced anterior laryngeal mobility;Premature spillage to valleculae;Reduced laryngeal elevation;Reduced airway/laryngeal closure Penetration/Aspiration details (thin cup): Material enters airway, CONTACTS cords and not ejected out;Material enters airway, remains ABOVE vocal cords and not ejected out;Material enters airway, passes BELOW cords without attempt by patient to eject out (silent aspiration) Pharyngeal - Thin Straw: Reduced tongue base retraction;Reduced pharyngeal peristalsis;Reduced anterior laryngeal mobility;Reduced laryngeal elevation;Premature spillage to valleculae;Reduced airway/laryngeal closure;Trace aspiration;Pharyngeal residue - cp segment;Penetration/Aspiration during swallow Penetration/Aspiration details (thin straw): Material enters airway, passes BELOW cords without attempt by  patient to eject out (silent aspiration);Material enters airway, CONTACTS cords and not ejected out;Material enters airway, remains ABOVE vocal cords and not ejected out Pharyngeal - Solids Pharyngeal - Puree: Reduced tongue base retraction;Reduced pharyngeal peristalsis;Reduced anterior laryngeal mobility;Reduced laryngeal elevation;Reduced airway/laryngeal closure;Pharyngeal residue - valleculae;Pharyngeal residue - cp segment Pharyngeal - Mechanical Soft: Reduced tongue base retraction;Pharyngeal residue - cp segment;Delayed swallow initiation;Premature spillage to valleculae;Reduced airway/laryngeal closure;Reduced laryngeal elevation;Pharyngeal residue - valleculae  Cervical Esophageal Phase    GO    Cervical Esophageal Phase Cervical Esophageal Phase: Impaired Cervical Esophageal Phase - Solids Puree: Prominent cricopharyngeal segment;Esophageal backflow into cervical esophagus Pill: Prominent cricopharyngeal segment;Other (Comment);Reduced cricopharyngeal relaxation Cervical Esophageal Phase - Comment Cervical Esophageal Comment: [facility]         Cordon Gassett MS, CCC-SLP [phone] [facility] LLCDANKOF,Desarie Feild 10/09/2013, 8:05 PM

## 2013-10-09 NOTE — Progress Notes (Addendum)
[facility] PROGRESS NOTE  Katha HammingCallie B Fogelman [id number] DOB: 06/20/1923 DOA: 10/07/2013 PCP: [name], [name], MD  Assessment/Plan: 80 y/o female with PMH of HTN, HPL, chronic bronchitis, gout,  presented with SOB, cough, fever fount to have pneumonia    1. Pneumonia/sirs/sepsis/bacteremia; CXR: Bronchopneumonia within the right lower lobe; h/o recurrent PNA -IV atx, bronchodilators, oxygen; blood cultures Gr neg cocci pend sensitivity; influenza neg; echo LVEF 55%; -cont IVF, double atx coverage cefepime+quinolone until sensitivity is back; monitor in SDU;  D/c vanc; pend MBS   2. Chronic bronchitis; probable underlying COPD -cont inhaled bronchodilators; oxygen  3. HTN, currently hypotensive hold amlodipine, hydralazine, losartan, cont BB, monitor    4. Hypo K resolved after supplementation     Code Status: full Family Communication:  D/w patient, called [name],[name] Sister [phone] no answer try, later  (indicate person spoken with, relationship, and if by phone, the number) Disposition Plan: pend PT eval    Consultants:  None   Procedures:  None   Antibiotics:  Cefepime 2/20<<<<  vanc 2/20 <<<<   (indicate start date, and stop date if known)  HPI/Subjective: alert  Objective: Filed Vitals:   10/09/13 0352  BP: 117/71  Pulse: 91  Temp: 97.8 F (36.6 C)  Resp: 30    Intake/Output Summary (Last 24 hours) at 10/09/13 0819 Last data filed at 10/09/13 0500  Gross per 24 hour  Intake   1890 ml  Output    100 ml  Net   1790 ml   Filed Weights   10/07/13 1041 10/09/13 0352  Weight: 47.174 kg (104 lb) 50.5 kg (111 lb 5.3 oz)    Exam:   General:  alert  Cardiovascular: s1,s2 rrr  Respiratory: R LL rales   Abdomen: soft, nt, nd   Musculoskeletal: no LE edema   Data Reviewed: Basic Metabolic Panel:  Recent Labs Lab 10/07/13 1145 10/07/13 1859 10/08/13 0457 10/09/13 0318  NA 140  --  139 139  K 2.9*  --  4.1 4.6  CL 101  --  103  108  CO2  --   --  23 20  GLUCOSE 125*  --  110* 131*  BUN 31*  --  36* 34*  CREATININE 1.10 1.06 1.17* 0.93  CALCIUM  --   --  9.1 9.0   Liver Function Tests:  Recent Labs Lab 10/08/13 0457  AST 25  ALT 14  ALKPHOS 63  BILITOT 0.6  PROT 5.7*  ALBUMIN 2.4*   No results found for this basename: LIPASE, AMYLASE,  in the last 168 hours No results found for this basename: AMMONIA,  in the last 168 hours CBC:  Recent Labs Lab 10/07/13 1129 10/07/13 1145 10/07/13 1859 10/08/13 0457 10/09/13 0318  WBC 13.5*  --  10.3 16.4* 21.8*  NEUTROABS 12.2*  --   --   --   --   HGB 12.1 11.9* 11.4* 10.4* 11.3*  HCT 35.6* 35.0* 34.3* 31.3* 33.7*  MCV 87.9  --  88.9 88.4 88.9  PLT 288  --  294 269 281   Cardiac Enzymes: No results found for this basename: CKTOTAL, CKMB, CKMBINDEX, TROPONINI,  in the last 168 hours BNP (last 3 results)  Recent Labs  10/08/13 0457  PROBNP 4063.0*   CBG: No results found for this basename: GLUCAP,  in the last 168 hours  Recent Results (from the past 240 hour(s))  CULTURE, BLOOD (ROUTINE X 2)     Status: None   Collection Time  10/07/13 12:39 PM      Result Value Ref Range Status   Specimen Description BLOOD RIGHT HAND   Final   Special Requests BOTTLES DRAWN AEROBIC AND ANAEROBIC 5CC   Final   Culture  Setup Time     Final   Value: 10/07/2013 16:50     Performed at [facility]   Culture     Final   Value: GRAM NEGATIVE COCCOBACILLI     Note: Gram Stain Report Called to,Read Back By and Verified With: Isidor Holts 10/08/13 @ 12:23PM BY RUSCOE A.     Performed at [facility]   Report Status PENDING   Incomplete  CULTURE, BLOOD (ROUTINE X 2)     Status: None   Collection Time    10/07/13 12:45 PM      Result Value Ref Range Status   Specimen Description BLOOD LEFT HAND   Final   Special Requests BOTTLES DRAWN AEROBIC ONLY 5CC   Final   Culture  Setup Time     Final   Value: 10/07/2013 16:51     Performed at [facility]   Culture     Final   Value: GRAM NEGATIVE COCCOBACILLI     Note: Gram Stain Report Called to,Read Back By and Verified With: Isidor Holts 10/08/13 @ 12:23PM BY RUSCOE A.     Performed at [facility]   Report Status PENDING   Incomplete     Studies: Dg Chest 2 View  10/07/2013   CLINICAL DATA:  Cough.  Chest congestion.  Chest pain.  EXAM: CHEST  2 VIEW  COMPARISON:  06/09/2013  FINDINGS: Heart size is normal. The left lung shows chronic scarring. There is a infiltrate and volume loss in the right lower lobe consistent with bronchopneumonia. Right upper lobe remains clear. No effusions. No significant bony finding.  IMPRESSION: Background pattern of chronic pulmonary scarring. Bronchopneumonia within the right lower lobe with consolidation and volume loss.   Electronically Signed   By: Paulina Fusi M.D.   On: 10/07/2013 11:58    Scheduled Meds: . allopurinol  450 mg Oral QHS  . atenolol  12.5 mg Oral Daily  . ceFEPime (MAXIPIME) IV  1 g Intravenous Q24H  . heparin  5,000 Units Subcutaneous 3 times per day  . simvastatin  40 mg Oral QHS  . sodium chloride  3 mL Intravenous Q12H  . vancomycin  500 mg Intravenous Q24H   Continuous Infusions: . 0.9 % NaCl with KCl 20 mEq / L 1,000 mL (10/09/13 3244)    Active Problems:   CAP (community acquired pneumonia)   Community acquired pneumonia   Acute respiratory failure   Hypokalemia   COPD with exacerbation    Time spent: >35 minutes     Esperanza Sheets  [facility] Pager [phone]. If 7PM-7AM, please contact night-coverage at [email or web address], password [facility] 10/09/2013, 8:19 AM  LOS: 2 days

## 2013-10-09 NOTE — Progress Notes (Signed)
ANTIBIOTIC CONSULT NOTE - Follow Up  Pharmacy Consult for cefepime and Levaquin Indication: pneumonia  Allergies  Allergen Reactions  . Penicillins Rash    Patient Measurements: Height: 5\' 5"  (165.1 cm) Weight: 111 lb 5.3 oz (50.5 kg) IBW/kg (Calculated) : 57   Vital Signs: Temp: 97.6 F (36.4 C) (02/22 1115) Temp src: Oral (02/22 1115) BP: 134/83 mmHg (02/22 1115) Pulse Rate: 89 (02/22 1115) Intake/Output from previous day: 02/21 0701 - 02/22 0700 In: 2040 [P.O.:440; I.V.:1250; IV Piggyback:350] Out: 100 [Urine:100] Intake/Output from this shift: Total I/O In: 390 [P.O.:240; I.V.:150] Out: -   Labs:  Recent Labs  10/07/13 1859 10/08/13 0457 10/09/13 0318  WBC 10.3 16.4* 21.8*  HGB 11.4* 10.4* 11.3*  PLT 294 269 281  CREATININE 1.06 1.17* 0.93   Estimated Creatinine Clearance: 33.3 ml/min (by C-G formula based on Cr of 0.93). No results found for this basename: VANCOTROUGH, Leodis Binet, VANCORANDOM, GENTTROUGH, GENTPEAK, GENTRANDOM, TOBRATROUGH, TOBRAPEAK, TOBRARND, AMIKACINPEAK, AMIKACINTROU, AMIKACIN,  in the last 72 hours   Microbiology: Recent Results (from the past 720 hour(s))  CULTURE, BLOOD (ROUTINE X 2)     Status: None   Collection Time    10/07/13 12:39 PM      Result Value Ref Range Status   Specimen Description BLOOD RIGHT HAND   Final   Special Requests BOTTLES DRAWN AEROBIC AND ANAEROBIC 5CC   Final   Culture  Setup Time     Final   Value: 10/07/2013 16:50     Performed at [facility]   Culture     Final   Value: HAEMOPHILUS SPECIES     Note: BETA LACTAMASE NEGATIVE     Note: Gram Stain Report Called to,Read Back By and Verified With: Isidor Holts 10/08/13 @ 12:23PM BY RUSCOE A.     Performed at [facility]   Report Status PENDING   Incomplete  CULTURE, BLOOD (ROUTINE X 2)     Status: None   Collection Time    10/07/13 12:45 PM      Result Value Ref Range Status   Specimen Description BLOOD LEFT HAND   Final   Special  Requests BOTTLES DRAWN AEROBIC ONLY 5CC   Final   Culture  Setup Time     Final   Value: 10/07/2013 16:51     Performed at [facility]   Culture     Final   Value: HAEMOPHILUS SPECIES     Note: BETA LACTAMASE NEGATIVE     Note: Gram Stain Report Called to,Read Back By and Verified With: Isidor Holts 10/08/13 @ 12:23PM BY RUSCOE A.     Performed at [facility]   Report Status PENDING   Incomplete    Medical History: Past Medical History  Diagnosis Date  . Hypertension   . Arthritis   . Gout    Assessment: 80 yo F started on vancomycin and cefepime for PNA.  Vancomycin has been discontinued as blood cultures are growing gram negative organisms. Pharmacy has been consulted to add levaquin to cefepime therapy for double coverage per MD.   SCr 0.93, CrCl ~26 ml/min. Patient is afebrile, through WBC have increased (10.3 on 2/20 >> now 21.8)  Goal of Therapy:  Proper antibiotic dosing Eradication of infection  Plan:  - Continue cefepime 1gm IV q24 hours - Start Levaquin 750 mg IV q 48 hours - F/u renal function, clinical course and cultures - De-escalate antibiotics as able   Lin Hackmann C. Salathiel Ferrara, PharmD Clinical Pharmacist-Resident  Pager: [phone] Pharmacy: [phone] 10/09/2013 12:59 PM

## 2013-10-10 DIAGNOSIS — E43 Unspecified severe protein-calorie malnutrition: Secondary | ICD-10-CM | POA: Insufficient documentation

## 2013-10-10 DIAGNOSIS — A413 Sepsis due to Hemophilus influenzae: Secondary | ICD-10-CM

## 2013-10-10 LAB — CBC
HCT: 31.1 % — ABNORMAL LOW (ref 36.0–46.0)
HEMOGLOBIN: 10.2 g/dL — AB (ref 12.0–15.0)
MCH: 29.2 pg (ref 26.0–34.0)
MCHC: 32.8 g/dL (ref 30.0–36.0)
MCV: 89.1 fL (ref 78.0–100.0)
PLATELETS: 260 10*3/uL (ref 150–400)
RBC: 3.49 MIL/uL — AB (ref 3.87–5.11)
RDW: 14.3 % (ref 11.5–15.5)
WBC: 20 10*3/uL — AB (ref 4.0–10.5)

## 2013-10-10 LAB — TROPONIN I
Troponin I: <0.3 ng/mL (ref <0.30)
Troponin I: <0.3 ng/mL (ref <0.30)

## 2013-10-10 LAB — BASIC METABOLIC PANEL
BUN: 21 mg/dL (ref 6–23)
CHLORIDE: 110 meq/L (ref 96–112)
CO2: 22 mEq/L (ref 19–32)
Calcium: 9 mg/dL (ref 8.4–10.5)
Creatinine, Ser: 0.82 mg/dL (ref 0.50–1.10)
GFR calc Af Amer: 72 mL/min — ABNORMAL LOW (ref >90)
GFR calc non Af Amer: 62 mL/min — ABNORMAL LOW (ref >90)
GLUCOSE: 125 mg/dL — AB (ref 70–99)
Potassium: 4.6 mEq/L (ref 3.7–5.3)
SODIUM: 142 meq/L (ref 137–147)

## 2013-10-10 MED ORDER — DEXTROSE 5 % IV SOLN
1.0000 g | INTRAVENOUS | Status: DC
  Administered 2013-10-11 – 2013-10-12 (×2): 1 g via INTRAVENOUS
  Filled 2013-10-10 (×2): qty 10

## 2013-10-10 MED ORDER — BOOST / RESOURCE BREEZE PO LIQD
1.0000 | Freq: Two times a day (BID) | ORAL | Status: DC
  Administered 2013-10-10 – 2013-10-17 (×9): 1 via ORAL

## 2013-10-10 NOTE — Progress Notes (Addendum)
[facility] PROGRESS NOTE  Katha HammingCallie B Michel ZOX:[id number] DOB: 03/20/1923 DOA: 10/07/2013 PCP: [name], [name], MD  Assessment/Plan: 80 y/o female with PMH of HTN, HPL, chronic bronchitis, gout,  presented with SOB, cough, fever fount to have pneumonia    1. Pneumonia/sirs/sepsis/bacteremia; CXR: Bronchopneumonia within the right lower lobe; h/o recurrent PNA -echo LVEF 55%; -blood cultures +HAEMOPHILUS INFLUENZAE pend sensitivity; persistent leukocytosis; obtain ID evaluation -cont IV atx, bronchodilators, oxygen; monitor in SDU;  D/c vanc; repeat blood cultures 2/23 -BMS: Dys3 diet    2. Chronic bronchitis; probable underlying COPD -cont inhaled bronchodilators; oxygen  3. HTN, currently hypotensive hold amlodipine, hydralazine, losartan, cont BB, monitor    4. Hypo K resolved after supplementation     Code Status: full Family Communication:  D/w patient, called [name],[name] Sister [phone] no answer try, later  (indicate person spoken with, relationship, and if by phone, the number) Disposition Plan: pend PT eval    Consultants:  None   Procedures:  None   Antibiotics:  Cefepime 2/20<<<<  vanc 2/20 <<<<   (indicate start date, and stop date if known)  HPI/Subjective: alert  Objective: Filed Vitals:   10/10/13 0335  BP: 123/69  Pulse: 82  Temp: 98.1 F (36.7 C)  Resp: 24    Intake/Output Summary (Last 24 hours) at 10/10/13 0843 Last data filed at 10/10/13 0700  Gross per 24 hour  Intake   2755 ml  Output    425 ml  Net   2330 ml   Filed Weights   10/07/13 1041 10/09/13 0352 10/10/13 0335  Weight: 47.174 kg (104 lb) 50.5 kg (111 lb 5.3 oz) 51.5 kg (113 lb 8.6 oz)    Exam:   General:  alert  Cardiovascular: s1,s2 rrr  Respiratory: R LL rales   Abdomen: soft, nt, nd   Musculoskeletal: no LE edema   Data Reviewed: Basic Metabolic Panel:  Recent Labs Lab 10/07/13 1145 10/07/13 1859 10/08/13 0457 10/09/13 0318 10/10/13 0311   NA 140  --  139 139 142  K 2.9*  --  4.1 4.6 4.6  CL 101  --  103 108 110  CO2  --   --  23 20 22   GLUCOSE 125*  --  110* 131* 125*  BUN 31*  --  36* 34* 21  CREATININE 1.10 1.06 1.17* 0.93 0.82  CALCIUM  --   --  9.1 9.0 9.0   Liver Function Tests:  Recent Labs Lab 10/08/13 0457  AST 25  ALT 14  ALKPHOS 63  BILITOT 0.6  PROT 5.7*  ALBUMIN 2.4*   No results found for this basename: LIPASE, AMYLASE,  in the last 168 hours No results found for this basename: AMMONIA,  in the last 168 hours CBC:  Recent Labs Lab 10/07/13 1129 10/07/13 1145 10/07/13 1859 10/08/13 0457 10/09/13 0318 10/10/13 0311  WBC 13.5*  --  10.3 16.4* 21.8* 20.0*  NEUTROABS 12.2*  --   --   --   --   --   HGB 12.1 11.9* 11.4* 10.4* 11.3* 10.2*  HCT 35.6* 35.0* 34.3* 31.3* 33.7* 31.1*  MCV 87.9  --  88.9 88.4 88.9 89.1  PLT 288  --  294 269 281 260   Cardiac Enzymes: No results found for this basename: CKTOTAL, CKMB, CKMBINDEX, TROPONINI,  in the last 168 hours BNP (last 3 results)  Recent Labs  10/08/13 0457  PROBNP 4063.0*   CBG: No results found for this basename: GLUCAP,  in the last 168 hours  Recent Results (from the past 240 hour(s))  CULTURE, BLOOD (ROUTINE X 2)     Status: None   Collection Time    10/07/13 12:39 PM      Result Value Ref Range Status   Specimen Description BLOOD RIGHT HAND   Final   Special Requests BOTTLES DRAWN AEROBIC AND ANAEROBIC 5CC   Final   Culture  Setup Time     Final   Value: 10/07/2013 16:50     Performed at [facility]   Culture     Final   Value: HAEMOPHILUS INFLUENZAE     Note: BETA LACTAMASE NEGATIVE     Note: Gram Stain Report Called to,Read Back By and Verified With: Isidor Holts 10/08/13 @ 12:23PM BY RUSCOE A.     Performed at [facility]   Report Status PENDING   Incomplete  CULTURE, BLOOD (ROUTINE X 2)     Status: None   Collection Time    10/07/13 12:45 PM      Result Value Ref Range Status   Specimen Description  BLOOD LEFT HAND   Final   Special Requests BOTTLES DRAWN AEROBIC ONLY 5CC   Final   Culture  Setup Time     Final   Value: 10/07/2013 16:51     Performed at [facility]   Culture     Final   Value: HAEMOPHILUS INFLUENZAE     Note: BETA LACTAMASE NEGATIVE     Note: Gram Stain Report Called to,Read Back By and Verified With: Isidor Holts 10/08/13 @ 12:23PM BY RUSCOE A.     Performed at [facility]   Report Status PENDING   Incomplete     Studies: Dg Swallowing Func-speech Pathology  10/09/2013   Lacinda Axon, CCC-SLP     10/09/2013  8:28 PM Objective Swallowing Evaluation: Modified Barium Swallowing Study   Patient Details  Name: [name] MRN: [id number] Date of Birth: 07-06-1923  Today's Date: 10/09/2013 Time: 1530-1600 SLP Time Calculation (min): 30 min  Past Medical History:  Past Medical History  Diagnosis Date  . Hypertension   . Arthritis   . Gout    Past Surgical History:  Past Surgical History  Procedure Laterality Date  . Appendectomy     HPI:  80 y/o female with PMH significant for HTN, arthritis, gout,  bronchitis, and repeat PNA.  Brought to ED with pleuritic chest  pain.  CXR: background pattern of chronic pulmonary scarring.   Bronchopneumonia within right lower lobe with consolidation and  volume loss. Patient reports dysphagia as "getting strangled  during meals every day.  MBS indicated from BSE to assess risk  for aspiration due to repeated PNA.       Assessment / Plan / Recommendation Clinical Impression  Dysphagia Diagnosis: Mild oral phase dysphagia;Mild pharyngeal  phase dysphagia;Mild cervical esophageal phase dysphagia.  Oral phase marked by piecemeal swallows and delayed oral transit  with all liquid and solid consistencies.   Mild to moderate  pharyngeal phase dysphagia with esophageal involvement.   All  swallows triggered at valleculae with immediate backflow to  pyriforms s/p swallow of thin, nectar, and puree consistencies.   During second swallow,  backflow,from pyriforms spill in airway  resulting in trace silent penetration during swallow with  eventual trace aspiration with thin liquid barium by spoon, cup,  and straw.  Cued throat clear and cough cleared majority of  penetrates and aspirated material.  Chin tuck not effective in  eliminating  penetration/aspiration.  Minimal residuals in  vallecular space and pyriforms s/p swallow of thin, nectar, and  puree consistency. Strategies of multiple swallows effective in  clearing residuals.   Appears to be osteophytes at C3 and C6 no  radiologist present to confirm.  Prominent CP muscle noted.   Brief esophageal screen indicates slow bolus transit with  backflow to cervical esophagus noted.  Containment of whole  barium tablet noted at LES.  Recommend to continue current diet  consistency of dysphagia 3 and nectar thick liquids with full  supervision with all meals.  Recommend aspiration and reflux  precautions as risk remains s/p swallow due to noted backflow.   ST to continue in acute care setting for diet tolerance.   Recommend Skilled ST at next level of care.  Repeat MBS with  clinical improvement prior to liquid upgrade as aspiration events  silent.  May benefit from GI consult to further assess esophageal  functioning.      Treatment Recommendation  Therapy as outlined in treatment plan below    Diet Recommendation Dysphagia 3 (Mechanical Soft);Nectar-thick  liquid   Liquid Administration via: Cup;No straw Medication Administration: Crushed with puree Supervision: Patient able to self feed;Full supervision/cueing  for compensatory strategies Compensations: Slow rate;Small sips/bites;Effortful  swallow;Multiple dry swallows after each bite/sip;Clear throat  intermittently Postural Changes and/or Swallow Maneuvers: Seated upright 90  degrees;Upright 30-60 min after meal    Other  Recommendations Recommended Consults: Consider GI  evaluation Oral Care Recommendations: Oral care Q4 per protocol Other  Recommendations: Order thickener from pharmacy;Prohibited  food (jello, ice cream, thin soups);Remove water pitcher;Clarify  dietary restrictions   Follow Up Recommendations  Home health SLP    Frequency and Duration min 2x/week  2 weeks       SLP Swallow Goals     General Date of Onset: 10/07/13 HPI: 80 y/o female with PMH significant for HTN, arthritis, gout,  bronchitis, and repeat PNA.  Brought to ED with pleuritic chest  pain.  CXR: background pattern of chronic pulmonary scarring.   Bronchopneumonia within right lower lobe with consolidation and  volume loss. Patient reports dysphagia as "getting strangled  during meals every day.  MBS indicated from BSE to assess risk  for aspiration due to repeated PNA.   Type of Study: Modified Barium Swallowing Study Reason for Referral: Objectively evaluate swallowing function Previous Swallow Assessment: BSE 10/08/13 Diet Prior to this Study: Dysphagia 3 (soft);Nectar-thick liquids Temperature Spikes Noted: No Respiratory Status: Nasal cannula History of Recent Intubation: No Behavior/Cognition: Alert;Cooperative;Pleasant mood Oral Cavity - Dentition: Missing dentition Oral Motor / Sensory Function: Impaired - see Bedside swallow  eval Self-Feeding Abilities: Able to feed self Patient Positioning: Upright in chair Baseline Vocal Quality: Clear Volitional Cough: Strong Volitional Swallow: Able to elicit Pharyngeal Secretions: Not observed secondary MBS    Reason for Referral Objectively evaluate swallowing function   Oral Phase Oral Preparation/Oral Phase Oral Phase: Impaired Oral - Nectar Oral - Nectar Teaspoon: Reduced posterior propulsion;Decreased  velopharyngeal closure;Piecemeal swallowing;Delayed oral transit Oral - Nectar Cup: Piecemeal swallowing;Reduced posterior  propulsion;Decreased velopharyngeal closure;Delayed oral transit Oral - Thin Oral - Thin Teaspoon: Incomplete tongue to palate  contact;Piecemeal swallowing;Reduced posterior  propulsion;Decreased  velopharyngeal closure;Delayed oral transit Oral - Solids Oral - Puree: Piecemeal swallowing;Incomplete tongue to palate  contact;Decreased velopharyngeal closure Oral - Mechanical Soft: Piecemeal swallowing;Decreased  velopharyngeal closure;Reduced posterior propulsion;Delayed oral  transit   Pharyngeal Phase Pharyngeal Phase Pharyngeal Phase: Impaired Pharyngeal - Nectar Pharyngeal - Nectar Teaspoon: Reduced pharyngeal  peristalsis;Reduced anterior laryngeal mobility;Reduced laryngeal  elevation;Premature spillage to valleculae;Reduced  airway/laryngeal closure;Reduced tongue base  retraction;Pharyngeal residue - pyriform sinuses;Pharyngeal  residue - valleculae;Pharyngeal residue - cp segment Pharyngeal - Nectar Cup: Reduced tongue base retraction;Reduced  pharyngeal peristalsis;Pharyngeal residue - cp segment;Reduced  anterior laryngeal mobility;Premature spillage to  valleculae;Reduced airway/laryngeal closure;Reduced laryngeal  elevation Pharyngeal - Thin Pharyngeal - Thin Teaspoon: Reduced pharyngeal  peristalsis;Reduced tongue base retraction;Penetration/Aspiration  during swallow;Penetration/Aspiration after swallow;Reduced  anterior laryngeal mobility;Reduced laryngeal elevation;Premature  spillage to valleculae;Reduced airway/laryngeal closure;Trace  aspiration;Pharyngeal residue - pyriform sinuses Penetration/Aspiration details (thin teaspoon): Material enters  airway, remains ABOVE vocal cords and not ejected out;Material  enters airway, passes BELOW cords without attempt by patient to  eject out (silent aspiration) Pharyngeal - Thin Cup: Reduced pharyngeal peristalsis;Reduced  tongue base retraction;Penetration/Aspiration during  swallow;Reduced anterior laryngeal mobility;Premature spillage to  valleculae;Reduced laryngeal elevation;Reduced airway/laryngeal  closure Penetration/Aspiration details (thin cup): Material enters  airway, CONTACTS cords and not ejected out;Material enters  airway, remains  ABOVE vocal cords and not ejected out;Material  enters airway, passes BELOW cords without attempt by patient to  eject out (silent aspiration) Pharyngeal - Thin Straw: Reduced tongue base retraction;Reduced  pharyngeal peristalsis;Reduced anterior laryngeal  mobility;Reduced laryngeal elevation;Premature spillage to  valleculae;Reduced airway/laryngeal closure;Trace  aspiration;Pharyngeal residue - cp segment;Penetration/Aspiration  during swallow Penetration/Aspiration details (thin straw): Material enters  airway, passes BELOW cords without attempt by patient to eject  out (silent aspiration);Material enters airway, CONTACTS cords  and not ejected out;Material enters airway, remains ABOVE vocal  cords and not ejected out Pharyngeal - Solids Pharyngeal - Puree: Reduced tongue base retraction;Reduced  pharyngeal peristalsis;Reduced anterior laryngeal  mobility;Reduced laryngeal elevation;Reduced airway/laryngeal  closure;Pharyngeal residue - valleculae;Pharyngeal residue - cp  segment Pharyngeal - Mechanical Soft: Reduced tongue base  retraction;Pharyngeal residue - cp segment;Delayed swallow  initiation;Premature spillage to valleculae;Reduced  airway/laryngeal closure;Reduced laryngeal elevation;Pharyngeal  residue - valleculae  Cervical Esophageal Phase    GO    Cervical Esophageal Phase Cervical Esophageal Phase: Impaired Cervical Esophageal Phase - Solids Puree: Prominent cricopharyngeal segment;Esophageal backflow into  cervical esophagus Pill: Prominent cricopharyngeal segment;Other (Comment);Reduced  cricopharyngeal relaxation Cervical Esophageal Phase - Comment Cervical Esophageal Comment: contained at [facility] MS, CCC-SLP [phone] [facility] 10/09/2013, 8:05 PM     Scheduled Meds: . allopurinol  150 mg Oral QHS  . atenolol  12.5 mg Oral Daily  . ceFEPime (MAXIPIME) IV  1 g Intravenous Q24H  . heparin  5,000 Units Subcutaneous 3 times per day  . levofloxacin (LEVAQUIN) IV  750 mg  Intravenous Q48H  . simvastatin  40 mg Oral QHS  . sodium chloride  3 mL Intravenous Q12H   Continuous Infusions: . 0.9 % NaCl with KCl 20 mEq / L Stopped (10/10/13 0831)    Active Problems:   CAP (community acquired pneumonia)   Community acquired pneumonia   Acute respiratory failure   Hypokalemia   COPD with exacerbation    Time spent: >35 minutes     Esperanza Sheets  [facility] Pager [phone]. If 7PM-7AM, please contact night-coverage at [email or web address], password [facility] 10/10/2013, 8:43 AM  LOS: 3 days

## 2013-10-10 NOTE — Progress Notes (Addendum)
INITIAL NUTRITION ASSESSMENT  DOCUMENTATION CODES Per approved criteria  -Severe malnutrition in the context of chronic illness   INTERVENTION: Resource Breeze po BID (thickened to appropriate consistency), each supplement provides 250 kcal and 9 grams of protein RD to follow for nutrition care plan  NUTRITION DIAGNOSIS: Inadequate oral intake related to decreased appetite, acute illness as evidenced by PO intake 0-50%  Goal: Pt to meet >/= 90% of their estimated nutrition needs   Monitor:  PO & supplemental intake, weight, labs, I/O's  Reason for Assessment: Malnutrition Screening Tool Report  80 y.o. female  Admitting Dx: pneumonia/sepsis/bacteremia  ASSESSMENT: Patient with PMH of arthritis and gout recently treated for bronchitis, was brought to ER with pleuritic chest pain, cough, fever and weakness.  RD unable to interview pt; sleeping soundly and unable to wake; pt seen per Clinical Nutrition during previous hospitalization (October 2014); pt reported UBW is 126 lbs; s/p MBSS 2/22 -- pt with mild oral/pharyngreal dysphagia; pt does not like Ensure supplements, however, likes juice -- Resource Breeze supplement added per RD; this RD to add Resource at this time.  Nutrition Focused Physical Exam:   Subcutaneous Fat:  Orbital Region: moderate wasting  Upper Arm Region: severe wasting  Thoracic and Lumbar Region: severe wasting   Muscle:  Temple Region: severe wasting  Clavicle Bone Region: severe wasting  Clavicle and Acromion Bone Region: severe wasting  Scapular Bone Region: severe wasting  Dorsal Hand: severe wasting  Patellar Region: severe wasting  Anterior Thigh Region: severe wasting  Posterior Calf Region: severe wasting  Edema: none  Patient continues to meet criteria for severe malnutrition in the context of chronic illness as evidenced by severe muscle and subcutaneous fat loss.  Height: Ht Readings from Last 1 Encounters:  10/07/13 5\' 5"  (1.651 m)     Weight: Wt Readings from Last 1 Encounters:  10/10/13 113 lb 8.6 oz (51.5 kg)    Ideal Body Weight: 125 lb  % Ideal Body Weight: 90%  Wt Readings from Last 10 Encounters:  10/10/13 113 lb 8.6 oz (51.5 kg)  06/14/13 103 lb 6.3 oz (46.9 kg)  12/02/12 107 lb 2.3 oz (48.6 kg)    Usual Body Weight: 126 lb  % Usual Body Weight: 89%  BMI:  Body mass index is 18.89 kg/(m^2).  Estimated Nutritional Needs: Kcal: 1400-1600 Protein: 60-70 gm Fluid: >/= 1.5 L  Skin: Intact  Diet Order: Dysphagia -- nectar thick liquids  EDUCATION NEEDS: -No education needs identified at this time   Intake/Output Summary (Last 24 hours) at 10/10/13 1227 Last data filed at 10/10/13 1100  Gross per 24 hour  Intake   2680 ml  Output    825 ml  Net   1855 ml    Labs:   Recent Labs Lab 10/08/13 0457 10/09/13 0318 10/10/13 0311  NA 139 139 142  K 4.1 4.6 4.6  CL 103 108 110  CO2 23 20 22   BUN 36* 34* 21  CREATININE 1.17* 0.93 0.82  CALCIUM 9.1 9.0 9.0  GLUCOSE 110* 131* 125*    Scheduled Meds: . allopurinol  150 mg Oral QHS  . atenolol  12.5 mg Oral Daily  . ceFEPime (MAXIPIME) IV  1 g Intravenous Q24H  . heparin  5,000 Units Subcutaneous 3 times per day  . levofloxacin (LEVAQUIN) IV  750 mg Intravenous Q48H  . simvastatin  40 mg Oral QHS  . sodium chloride  3 mL Intravenous Q12H    Continuous Infusions:   Past Medical  History  Diagnosis Date  . Hypertension   . Arthritis   . Gout     Past Surgical History  Procedure Laterality Date  . Appendectomy      Maureen ChattersKatie Darinda [place], RD, LDN Pager #: [phone] After-Hours Pager #: [phone]

## 2013-10-10 NOTE — Progress Notes (Addendum)
Physical Therapy Treatment Patient Details Name: Lindsay HammingCallie B Stave MRN: [id number] DOB: 08/20/1922 Today's Date: 10/10/2013 Time: 0740-0800 PT Time Calculation (min): 20 min  PT Assessment / Plan / Recommendation  History of Present Illness Patient is an 80 yo female with medical history of hypertension arthritis and gout. She was recently treated for bronchitis, was brought to the emergency room pleuritic chest pain. She said that after she started antibiotics she was feeling better but then 3 days prior to admission she started developing pleuritic chest pain cough sneezing and felt very weak.  Patient admitted with CAP, COPD exacerbation, hypokalcemia.   PT Comments   Pt admitted with above. Pt currently with functional limitations due to balance and endurance deficits. Pt will benefit from skilled PT to increase their independence and safety with mobility to allow discharge to the venue listed below.   Follow Up Recommendations  SNF;Supervision/Assistance - 24 hour     Does the patient have the potential to tolerate intense rehabilitation     Barriers to Discharge        Equipment Recommendations  None recommended by PT    Recommendations for Other Services    Frequency Min 3X/week   Progress towards PT Goals Progress towards PT goals: Progressing toward goals  Plan Current plan remains appropriate    Precautions / Restrictions Precautions Precautions: Fall Restrictions Weight Bearing Restrictions: No   Pertinent Vitals/Pain 4LO2 throughout with DOE 3/4 , no pain    Mobility  Bed Mobility Overal bed mobility: Needs Assistance Bed Mobility: Supine to Sit Supine to sit: Mod assist General bed mobility comments: Assist for elevation of trunk. Transfers Overall transfer level: Needs assistance Equipment used: 1 person hand held assist Transfers: Sit to/from [employer] CorporationStand;Stand Pivot Transfers Sit to Stand: Min assist Stand pivot transfers: Mod assist General transfer comment:  Verbal cues for hand placement.  Assist to rise to standing and for balance.  In standing, patient with decreased balance, and increased dyspnea of 4/4.  Returned to sitting.  Remained on O2 during session. Ambulation/Gait Ambulation/Gait assistance: Min assist Ambulation Distance (Feet): 4 Feet Assistive device: 2 person hand held assist Gait Pattern/deviations: Step-through pattern;Decreased stride length;Shuffle;Staggering left;Staggering right;Narrow base of support;Trunk flexed Gait velocity interpretation: Below normal speed for age/gender General Gait Details: Pt ambulate a few steps to 3N1 and then to recliner.  Pt needed UE support bil.  Pt with flexed posture and posterior lean.  Needs steadying assist.  Also gets very winded.      Exercises General Exercises - Lower Extremity Ankle Circles/Pumps: AROM;Both;5 reps;Seated Long Arc Quad: AROM;Both;5 reps;Seated   PT Diagnosis:    PT Problem List:   PT Treatment Interventions:     PT Goals (current goals can now be found in the care plan section) Acute Rehab PT Goals Patient Stated Goal: To get better and go home  Visit Information  Last PT Received On: 10/10/13 Assistance Needed: +2 (for ambulation) History of Present Illness: Patient is an 80 yo female with medical history of hypertension arthritis and gout. She was recently treated for bronchitis, was brought to the emergency room pleuritic chest pain. She said that after she started antibiotics she was feeling better but then 3 days prior to admission she started developing pleuritic chest pain cough sneezing and felt very weak.  Patient admitted with CAP, COPD exacerbation, hypokalcemia.    Subjective Data  Subjective: "I want to get up." Patient Stated Goal: To get better and go home   Cognition  Cognition Arousal/Alertness:  Awake/alert Behavior During Therapy: WFL for tasks assessed/performed Overall Cognitive Status: Within Functional Limits for tasks assessed     Balance  Balance Overall balance assessment: Needs assistance;History of Falls Sitting-balance support: No upper extremity supported;Feet supported Sitting balance-Leahy Scale: Good Postural control: Posterior lean Standing balance support: Bilateral upper extremity supported;During functional activity Standing balance-Leahy Scale: Fair Standing balance comment: Pt with posterior lean in standing with static stance.  Pt needs UE support for stability.    End of Session PT - End of Session Equipment Utilized During Treatment: Gait belt;Oxygen Activity Tolerance: Patient limited by fatigue Patient left: in chair;with call bell/phone within reach Nurse Communication: Mobility status   GP     INGOLD,Dore Oquin 10/10/2013, 10:26 AM [facility] [phone] (pager)

## 2013-10-10 NOTE — Progress Notes (Signed)
Utilization Review Completed.  

## 2013-10-10 NOTE — Progress Notes (Signed)
CRITICAL VALUE ALERT  Critical value received:  10/07/13 Blood Culture grew Haemothilus Influenza  Date of notification:  10/10/13  Time of notification:  0947  Critical value read back:yes  Nurse who received alert:  Scharlene GlossJennifer May, RN   MD notified (1st page):  Dr York SpanielBuriev  Time of first page:  [phone]  MD notified (2nd page):  Time of second page:  Responding MD:  Dr York SpanielBuriev  Time MD responded: MD ordered another set blood cultures

## 2013-10-10 NOTE — Evaluation (Signed)
Occupational Therapy Evaluation Patient Details Name: [name] MRN: [id number] DOB: 06/20/1923 Today's Date: 10/10/2013 Time: [id number] OT Time Calculation (min): 26 min  OT Assessment / Plan / Recommendation History of present illness Patient is an 80 yo female with medical history of hypertension arthritis and gout. She was recently treated for bronchitis, was brought to the emergency room pleuritic chest pain. She said that after she started antibiotics she was feeling better but then 3 days prior to admission she started developing pleuritic chest pain cough sneezing and felt very weak.  Patient admitted with CAP, COPD exacerbation, hypokalcemia.   Clinical Impression   Pt presents with generalized weakness and poor activity tolerance interfering with ADL.  She requires min assist for basic transfers.  Pt will need post acute rehab prior to return home.  Will follow acutely.   OT Assessment  Patient needs continued OT Services    Follow Up Recommendations  SNF    Barriers to Discharge Decreased caregiver support;Inaccessible home environment    Equipment Recommendations       Recommendations for Other Services    Frequency  Min 2X/week    Precautions / Restrictions Precautions Precautions: Fall Restrictions Weight Bearing Restrictions: No   Pertinent Vitals/Pain On 3L 02, VSS, no pain reported    ADL  Eating/Feeding: Set up Where Assessed - Eating/Feeding: Chair Grooming: Wash/dry hands;Wash/dry face;Set up Where Assessed - Grooming: Supported sitting Upper Body Bathing: Minimal assistance Where Assessed - Upper Body Bathing: Unsupported sitting Lower Body Bathing: Moderate assistance Where Assessed - Lower Body Bathing: Unsupported sitting;Supported sit to stand Upper Body Dressing: Minimal assistance Where Assessed - Upper Body Dressing: Unsupported sitting Lower Body Dressing: Moderate assistance Where Assessed - Lower Body Dressing: Unsupported  sitting;Supported sit to stand Toilet Transfer: Minimal assistance Toilet Transfer Method: Sit to stand Toilet Transfer Equipment: Bedside commode Toileting - Clothing Manipulation and Hygiene: Minimal assistance Where Assessed - Toileting Clothing Manipulation and Hygiene: Sit to stand from 3-in-1 or toilet Equipment Used: Gait belt Transfers/Ambulation Related to ADLs: min assist chair to [facility] and back ADL Comments: Pt limited by low endurance.    OT Diagnosis: Generalized weakness  OT Problem List: Decreased strength;Decreased activity tolerance;Impaired balance (sitting and/or standing);Decreased knowledge of use of DME or AE;Cardiopulmonary status limiting activity OT Treatment Interventions: Self-care/ADL training;Energy conservation;DME and/or AE instruction;Patient/family education   OT Goals(Current goals can be found in the care plan section) Acute Rehab OT Goals Patient Stated Goal: To get better and go home OT Goal Formulation: With patient Time For Goal Achievement: 10/24/13 Potential to Achieve Goals: Good ADL Goals Pt Will Perform Grooming: with supervision;standing Pt Will Perform Upper Body Bathing: sitting;with set-up Pt Will Perform Lower Body Bathing: with supervision;sit to/from stand Pt Will Perform Upper Body Dressing: with set-up;sitting Pt Will Perform Lower Body Dressing: with supervision;sit to/from stand Pt Will Transfer to Toilet: with supervision;ambulating;regular height toilet Pt Will Perform Toileting - Clothing Manipulation and hygiene: with supervision;sit to/from stand Additional ADL Goal #1: Pt will employ purse lip breathing and energy conservation strategies in self care and mobility with minimal verbal cues.  Visit Information  Last OT Received On: 10/10/13 Assistance Needed: +1 History of Present Illness: Patient is an 80 yo female with medical history of hypertension arthritis and gout. She was recently treated for bronchitis, was brought to  the emergency room pleuritic chest pain. She said that after she started antibiotics she was feeling better but then 3 days prior to admission she started developing pleuritic chest  pain cough sneezing and felt very weak.  Patient admitted with CAP, COPD exacerbation, hypokalcemia.       Prior Functioning     Home Living Family/patient expects to be discharged to:: Skilled nursing facility Living Arrangements: Alone Available Help at Discharge: Family;Available PRN/intermittently Type of Home: Apartment Home Access: Stairs to enter Entrance Stairs-Number of Steps: 5 Entrance Stairs-Rails: Right;Left Home Layout: One level Home Equipment: Walker - 2 wheels;Cane - single point Additional Comments: Pt would have to get up 15 steps to get to her bedroom and bathroom if she went home alone. Pt plans to go to sister's house. Prior Function Level of Independence: Independent Communication Communication: No difficulties Dominant Hand: Right         Vision/Perception Vision - History Patient Visual Report: No change from baseline   Cognition  Cognition Arousal/Alertness: Awake/alert Behavior During Therapy: WFL for tasks assessed/performed Overall Cognitive Status: Within Functional Limits for tasks assessed    Extremity/Trunk Assessment Upper Extremity Assessment Upper Extremity Assessment: Generalized weakness Lower Extremity Assessment Lower Extremity Assessment: Generalized weakness Cervical / Trunk Assessment Cervical / Trunk Assessment: Normal     Mobility Bed Mobility Overal bed mobility:  (pt received up in chair) Transfers Transfers: Sit to/from Stand Sit to Stand: Min assist     Exercise     Balance Balance Overall balance assessment: Needs assistance Sitting-balance support: Feet supported Sitting balance-Leahy Scale: Good Standing balance support: No upper extremity supported Standing balance-Leahy Scale: Fair   End of Session OT - End of  Session Activity Tolerance: Patient limited by fatigue Patient left: in chair;with call bell/phone within reach Nurse Communication:  (pt with bloody nose, urinated)  GO     Evern Bio 10/10/2013, 9:29 AM [phone]

## 2013-10-11 LAB — CBC
HCT: 30.9 % — ABNORMAL LOW (ref 36.0–46.0)
Hemoglobin: 10.3 g/dL — ABNORMAL LOW (ref 12.0–15.0)
MCH: 29.5 pg (ref 26.0–34.0)
MCHC: 33.3 g/dL (ref 30.0–36.0)
MCV: 88.5 fL (ref 78.0–100.0)
PLATELETS: 232 10*3/uL (ref 150–400)
RBC: 3.49 MIL/uL — AB (ref 3.87–5.11)
RDW: 14.3 % (ref 11.5–15.5)
WBC: 15.7 10*3/uL — ABNORMAL HIGH (ref 4.0–10.5)

## 2013-10-11 NOTE — Progress Notes (Signed)
Physical Therapy Treatment Patient Details Name: [name] MRN: [id number] DOB: 1923/04/03 Today's Date: 10/11/2013 Time: 5621-3086 PT Time Calculation (min): 24 min  PT Assessment / Plan / Recommendation  History of Present Illness Patient is an 80 yo female with medical history of hypertension arthritis and gout. She was recently treated for bronchitis, was brought to the emergency room pleuritic chest pain. She said that after she started antibiotics she was feeling better but then 3 days prior to admission she started developing pleuritic chest pain cough sneezing and felt very weak.  Patient admitted with CAP, COPD exacerbation, hypokalcemia.   PT Comments   Pt admitted with above. Pt currently with functional limitations due to balance and endurance deficits.  Pt will benefit from skilled PT to increase their independence and safety with mobility to allow discharge to the venue listed below.    Follow Up Recommendations  SNF;Supervision/Assistance - 24 hour     Does the patient have the potential to tolerate intense rehabilitation     Barriers to Discharge        Equipment Recommendations  None recommended by PT    Recommendations for Other Services    Frequency Min 3X/week   Progress towards PT Goals Progress towards PT goals: Progressing toward goals  Plan Current plan remains appropriate    Precautions / Restrictions Precautions Precautions: Fall Restrictions Weight Bearing Restrictions: No   Pertinent Vitals/Pain VSS, On 3LO2 with sats >90%.  No pain.    Mobility  Bed Mobility Overal bed mobility: Needs Assistance Bed Mobility: Supine to Sit Supine to sit: Mod assist General bed mobility comments: Assist for elevation of trunk. Transfers Overall transfer level: Needs assistance Equipment used: Rolling walker (2 wheeled) Transfers: Sit to/from Stand Sit to Stand: +2 physical assistance;Mod assist General transfer comment: Verbal cues for hand placement.   Assist to rise to standing and for balance.  In standing, patient with decreased balance needing steadying assist.   Remained on O2 during session. Ambulation/Gait Ambulation/Gait assistance: Min assist;+2 physical assistance Ambulation Distance (Feet): 25 Feet Assistive device: Rolling walker (2 wheeled) Gait Pattern/deviations: Step-through pattern;Decreased stride length;Shuffle;Staggering left;Staggering right;Narrow base of support;Trunk flexed Gait velocity interpretation: Below normal speed for age/gender General Gait Details: Pt able to ambulate with RW with pretty good balance with RW.  Encouraged pt to use RW at home and pt states, "I guess I should."  Pt needs cues and assist in uncontrolled environment to maneuver RW.  After in recliner, pt pivoted to 3N1 and urinated and then back to recliner needing min steadying assist as she did not want to use RW.  She has very poor balance without RW losing balance posteriorly with transfer.      Exercises General Exercises - Lower Extremity Ankle Circles/Pumps: AROM;Both;5 reps;Seated Long Arc Quad: AROM;Both;5 reps;Seated Hip Flexion/Marching: AROM;Both;10 reps;Seated   PT Diagnosis:    PT Problem List:   PT Treatment Interventions:     PT Goals (current goals can now be found in the care plan section)    Visit Information  Last PT Received On: 10/11/13 Assistance Needed: +2 History of Present Illness: Patient is an 80 yo female with medical history of hypertension arthritis and gout. She was recently treated for bronchitis, was brought to the emergency room pleuritic chest pain. She said that after she started antibiotics she was feeling better but then 3 days prior to admission she started developing pleuritic chest pain cough sneezing and felt very weak.  Patient admitted with CAP, COPD  exacerbation, hypokalcemia.    Subjective Data  Subjective: "I want to get up."   Cognition  Cognition Arousal/Alertness: Awake/alert Behavior  During Therapy: WFL for tasks assessed/performed Overall Cognitive Status: Within Functional Limits for tasks assessed    Balance  Balance Overall balance assessment: Needs assistance;History of Falls Sitting-balance support: No upper extremity supported;Feet supported Sitting balance-Leahy Scale: Good Postural control: Posterior lean Standing balance support: Bilateral upper extremity supported;During functional activity Standing balance-Leahy Scale: Fair Standing balance comment: Needs RW for support.  Significant posterior lean.   End of Session PT - End of Session Equipment Utilized During Treatment: Gait belt;Oxygen Activity Tolerance: Patient limited by fatigue Patient left: in chair;with call bell/phone within reach Nurse Communication: Mobility status   GP     INGOLD,Conni Knighton 10/11/2013, 1:28 PM [facility] [phone])

## 2013-10-11 NOTE — Progress Notes (Addendum)
[facility] PROGRESS NOTE  [name] ZOX:[id number] DOB: 03-14-1923 DOA: 10/07/2013 PCP: Lindsay Grippe, MD  Assessment/Plan: 80 y/o female with PMH of HTN, HPL, chronic bronchitis, gout,  presented with SOB, cough, fever fount to have pneumonia, bacteremia, hypotension   1. Pneumonia/sirs/sepsis/bacteremia; CXR: Bronchopneumonia within the right lower lobe; h/o recurrent PNA -2/20: blood cultures +HAEMOPHILUS INFLUENZAE; atx changed to-->ceftriaxone on 2/23;  -repeat blood cultures 2/23 pending; leukocytosis improving, afebrile  -improving; cont IV atx, bronchodilators, oxygen;  -MBS: Dys3 diet    2. Chronic bronchitis; probable underlying COPD;  -echo LVEF 55%; -cont inhaled bronchodilators; oxygen  3. HTN, currently hypotensive hold amlodipine, hydralazine, losartan, cont BB, monitor    4. Hypo K resolved after supplementation   TF SDU to [facility] 2/24  Code Status: full Family Communication:  D/w patient, called [name] Sister [phone] no answer try, later  (indicate person spoken with, relationship, and if by phone, the number) Disposition Plan: SNF in 2-3 days     Consultants:  None   Procedures:  None   Antibiotics:  Cefepime 2/20<<<<2/23  vanc 2/20 <<<<2/23  cipro 2/2<<2/23  Ceftriaxone 2/23<<<<<   (indicate start date, and stop date if known)  HPI/Subjective: alert  Objective: Filed Vitals:   10/11/13 0300  BP: 128/71  Pulse: 82  Temp: 98.3 F (36.8 C)  Resp: 26    Intake/Output Summary (Last 24 hours) at 10/11/13 0824 Last data filed at 10/10/13 2142  Gross per 24 hour  Intake    293 ml  Output   1100 ml  Net   -807 ml   Filed Weights   10/09/13 0352 10/10/13 0335 10/11/13 0500  Weight: 50.5 kg (111 lb 5.3 oz) 51.5 kg (113 lb 8.6 oz) 51.7 kg (113 lb 15.7 oz)    Exam:   General:  alert  Cardiovascular: s1,s2 rrr  Respiratory: R LL rales   Abdomen: soft, nt, nd   Musculoskeletal: no LE edema   Data  Reviewed: Basic Metabolic Panel:  Recent Labs Lab 10/07/13 1145 10/07/13 1859 10/08/13 0457 10/09/13 0318 10/10/13 0311  NA 140  --  139 139 142  K 2.9*  --  4.1 4.6 4.6  CL 101  --  103 108 110  CO2  --   --  23 20 22   GLUCOSE 125*  --  110* 131* 125*  BUN 31*  --  36* 34* 21  CREATININE 1.10 1.06 1.17* 0.93 0.82  CALCIUM  --   --  9.1 9.0 9.0   Liver Function Tests:  Recent Labs Lab 10/08/13 0457  AST 25  ALT 14  ALKPHOS 63  BILITOT 0.6  PROT 5.7*  ALBUMIN 2.4*   No results found for this basename: LIPASE, AMYLASE,  in the last 168 hours No results found for this basename: AMMONIA,  in the last 168 hours CBC:  Recent Labs Lab 10/07/13 1129  10/07/13 1859 10/08/13 0457 10/09/13 0318 10/10/13 0311 10/11/13 0253  WBC 13.5*  --  10.3 16.4* 21.8* 20.0* 15.7*  NEUTROABS 12.2*  --   --   --   --   --   --   HGB 12.1  < > 11.4* 10.4* 11.3* 10.2* 10.3*  HCT 35.6*  < > 34.3* 31.3* 33.7* 31.1* 30.9*  MCV 87.9  --  88.9 88.4 88.9 89.1 88.5  PLT 288  --  294 269 281 260 232  < > = values in this interval not displayed. Cardiac Enzymes:  Recent Labs Lab 10/10/13 0825 10/10/13  1415 10/10/13 2022  TROPONINI <0.30 <0.30 <0.30   BNP (last 3 results)  Recent Labs  10/08/13 0457  PROBNP 4063.0*   CBG: No results found for this basename: GLUCAP,  in the last 168 hours  Recent Results (from the past 240 hour(s))  CULTURE, BLOOD (ROUTINE X 2)     Status: None   Collection Time    10/07/13 12:39 PM      Result Value Ref Range Status   Specimen Description BLOOD RIGHT HAND   Final   Special Requests BOTTLES DRAWN AEROBIC AND ANAEROBIC 5CC   Final   Culture  Setup Time     Final   Value: 10/07/2013 16:50     Performed at [facility]   Culture     Final   Value: HAEMOPHILUS INFLUENZAE     Note: BETA LACTAMASE NEGATIVE Referred to [facility] in [place], [place] Ser[place]. CRITICAL RESULT CALLED TO, READ BACK BY AND  VERIFIED WITH: [name] @ 1004 10/10/13 BY KRAWS     Note: Gram Stain Report Called to,Read Back By and Verified With: [name] 10/08/13 @ 12:23PM BY Lindsay A.     Performed at [facility]   Report Status PENDING   Incomplete  CULTURE, BLOOD (ROUTINE X 2)     Status: None   Collection Time    10/07/13 12:45 PM      Result Value Ref Range Status   Specimen Description BLOOD LEFT HAND   Final   Special Requests BOTTLES DRAWN AEROBIC ONLY 5CC   Final   Culture  Setup Time     Final   Value: 10/07/2013 16:51     Performed at [facility]   Culture     Final   Value: HAEMOPHILUS INFLUENZAE     Note: BETA LACTAMASE NEGATIVE     Note: Gram Stain Report Called to,Read Back By and Verified With: [name] 10/08/13 @ 12:23PM BY Lindsay A.     Performed at [facility]   Report Status PENDING   Incomplete     Studies: Dg Swallowing Func-speech Pathology  10/09/2013   [name], CCC-SLP     10/09/2013  8:28 PM Objective Swallowing Evaluation: Modified Barium Swallowing Study   Patient Details  Name: [name] MRN: [id number] Date of Birth: 1922/09/19  Today's Date: 10/09/2013 Time: 1530-1600 SLP Time Calculation (min): 30 min  Past Medical History:  Past Medical History  Diagnosis Date  . Hypertension   . Arthritis   . Gout    Past Surgical History:  Past Surgical History  Procedure Laterality Date  . Appendectomy     HPI:  80 y/o female with PMH significant for HTN, arthritis, gout,  bronchitis, and repeat PNA.  Brought to ED with pleuritic chest  pain.  CXR: background pattern of chronic pulmonary scarring.   Bronchopneumonia within right lower lobe with consolidation and  volume loss. Patient reports dysphagia as "getting strangled  during meals every day.  MBS indicated from BSE to assess risk  for aspiration due to repeated PNA.       Assessment / Plan / Recommendation Clinical Impression  Dysphagia Diagnosis: Mild oral phase dysphagia;Mild pharyngeal  phase  dysphagia;Mild cervical esophageal phase dysphagia.  Oral phase marked by piecemeal swallows and delayed oral transit  with all liquid and solid consistencies.   Mild to moderate  pharyngeal phase dysphagia with esophageal involvement.   All  swallows triggered at valleculae  with immediate backflow to  pyriforms s/p swallow of thin, nectar, and puree consistencies.   During second swallow, backflow,from pyriforms spill in airway  resulting in trace silent penetration during swallow with  eventual trace aspiration with thin liquid barium by spoon, cup,  and straw.  Cued throat clear and cough cleared majority of  penetrates and aspirated material.  Chin tuck not effective in  eliminating penetration/aspiration.  Minimal residuals in  vallecular space and pyriforms s/p swallow of thin, nectar, and  puree consistency. Strategies of multiple swallows effective in  clearing residuals.   Appears to be osteophytes at C3 and C6 no  radiologist present to confirm.  Prominent CP muscle noted.   Brief esophageal screen indicates slow bolus transit with  backflow to cervical esophagus noted.  Containment of whole  barium tablet noted at LES.  Recommend to continue current diet  consistency of dysphagia 3 and nectar thick liquids with full  supervision with all meals.  Recommend aspiration and reflux  precautions as risk remains s/p swallow due to noted backflow.   ST to continue in acute care setting for diet tolerance.   Recommend Skilled ST at next level of care.  Repeat MBS with  clinical improvement prior to liquid upgrade as aspiration events  silent.  [name] benefit from GI consult to further assess esophageal  functioning.      Treatment Recommendation  Therapy as outlined in treatment plan below    Diet Recommendation Dysphagia 3 (Mechanical Soft);Nectar-thick  liquid   Liquid Administration via: Cup;No straw Medication Administration: Crushed with puree Supervision: Patient able to self feed;Full supervision/cueing  for  compensatory strategies Compensations: Slow rate;Small sips/bites;Effortful  swallow;Multiple dry swallows after each bite/sip;Clear throat  intermittently Postural Changes and/or Swallow Maneuvers: Seated upright 90  degrees;Upright 30-60 min after meal    Other  Recommendations Recommended Consults: Consider GI  evaluation Oral Care Recommendations: Oral care Q4 per protocol Other Recommendations: Order thickener from pharmacy;Prohibited  food (jello, ice cream, thin soups);Remove water pitcher;Clarify  dietary restrictions   Follow Up Recommendations  Home health SLP    Frequency and Duration min 2x/week  2 weeks       SLP Swallow Goals     General Date of Onset: 10/07/13 HPI: 80 y/o female with PMH significant for HTN, arthritis, gout,  bronchitis, and repeat PNA.  Brought to ED with pleuritic chest  pain.  CXR: background pattern of chronic pulmonary scarring.   Bronchopneumonia within right lower lobe with consolidation and  volume loss. Patient reports dysphagia as "getting strangled  during meals every day.  MBS indicated from BSE to assess risk  for aspiration due to repeated PNA.   Type of Study: Modified Barium Swallowing Study Reason for Referral: Objectively evaluate swallowing function Previous Swallow Assessment: BSE 10/08/13 Diet Prior to this Study: Dysphagia 3 (soft);Nectar-thick liquids Temperature Spikes Noted: No Respiratory Status: Nasal cannula History of Recent Intubation: No Behavior/Cognition: Alert;Cooperative;Pleasant mood Oral Cavity - Dentition: Missing dentition Oral Motor / Sensory Function: Impaired - see Bedside swallow  eval Self-Feeding Abilities: Able to feed self Patient Positioning: Upright in chair Baseline Vocal Quality: Clear Volitional Cough: Strong Volitional Swallow: Able to elicit Pharyngeal Secretions: Not observed secondary MBS    Reason for Referral Objectively evaluate swallowing function   Oral Phase Oral Preparation/Oral Phase Oral Phase: Impaired Oral - Nectar Oral  - Nectar Teaspoon: Reduced posterior propulsion;Decreased  velopharyngeal closure;Piecemeal swallowing;Delayed oral transit Oral - Nectar Cup: Piecemeal swallowing;Reduced posterior  propulsion;Decreased velopharyngeal closure;Delayed oral  transit Oral - Thin Oral - Thin Teaspoon: Incomplete tongue to palate  contact;Piecemeal swallowing;Reduced posterior  propulsion;Decreased velopharyngeal closure;Delayed oral transit Oral - Solids Oral - Puree: Piecemeal swallowing;Incomplete tongue to palate  contact;Decreased velopharyngeal closure Oral - Mechanical Soft: Piecemeal swallowing;Decreased  velopharyngeal closure;Reduced posterior propulsion;Delayed oral  transit   Pharyngeal Phase Pharyngeal Phase Pharyngeal Phase: Impaired Pharyngeal - Nectar Pharyngeal - Nectar Teaspoon: Reduced pharyngeal  peristalsis;Reduced anterior laryngeal mobility;Reduced laryngeal  elevation;Premature spillage to valleculae;Reduced  airway/laryngeal closure;Reduced tongue base  retraction;Pharyngeal residue - pyriform sinuses;Pharyngeal  residue - valleculae;Pharyngeal residue - cp segment Pharyngeal - Nectar Cup: Reduced tongue base retraction;Reduced  pharyngeal peristalsis;Pharyngeal residue - cp segment;Reduced  anterior laryngeal mobility;Premature spillage to  valleculae;Reduced airway/laryngeal closure;Reduced laryngeal  elevation Pharyngeal - Thin Pharyngeal - Thin Teaspoon: Reduced pharyngeal  peristalsis;Reduced tongue base retraction;Penetration/Aspiration  during swallow;Penetration/Aspiration after swallow;Reduced  anterior laryngeal mobility;Reduced laryngeal elevation;Premature  spillage to valleculae;Reduced airway/laryngeal closure;Trace  aspiration;Pharyngeal residue - pyriform sinuses Penetration/Aspiration details (thin teaspoon): Material enters  airway, remains ABOVE vocal cords and not ejected out;Material  enters airway, passes BELOW cords without attempt by patient to  eject out (silent aspiration) Pharyngeal -  Thin Cup: Reduced pharyngeal peristalsis;Reduced  tongue base retraction;Penetration/Aspiration during  swallow;Reduced anterior laryngeal mobility;Premature spillage to  valleculae;Reduced laryngeal elevation;Reduced airway/laryngeal  closure Penetration/Aspiration details (thin cup): Material enters  airway, CONTACTS cords and not ejected out;Material enters  airway, remains ABOVE vocal cords and not ejected out;Material  enters airway, passes BELOW cords without attempt by patient to  eject out (silent aspiration) Pharyngeal - Thin Straw: Reduced tongue base retraction;Reduced  pharyngeal peristalsis;Reduced anterior laryngeal  mobility;Reduced laryngeal elevation;Premature spillage to  valleculae;Reduced airway/laryngeal closure;Trace  aspiration;Pharyngeal residue - cp segment;Penetration/Aspiration  during swallow Penetration/Aspiration details (thin straw): Material enters  airway, passes BELOW cords without attempt by patient to eject  out (silent aspiration);Material enters airway, CONTACTS cords  and not ejected out;Material enters airway, remains ABOVE vocal  cords and not ejected out Pharyngeal - Solids Pharyngeal - Puree: Reduced tongue base retraction;Reduced  pharyngeal peristalsis;Reduced anterior laryngeal  mobility;Reduced laryngeal elevation;Reduced airway/laryngeal  closure;Pharyngeal residue - valleculae;Pharyngeal residue - cp  segment Pharyngeal - Mechanical Soft: Reduced tongue base  retraction;Pharyngeal residue - cp segment;Delayed swallow  initiation;Premature spillage to valleculae;Reduced  airway/laryngeal closure;Reduced laryngeal elevation;Pharyngeal  residue - valleculae  Cervical Esophageal Phase    GO    Cervical Esophageal Phase Cervical Esophageal Phase: Impaired Cervical Esophageal Phase - Solids Puree: Prominent cricopharyngeal segment;Esophageal backflow into  cervical esophagus Pill: Prominent cricopharyngeal segment;Other (Comment);Reduced  cricopharyngeal relaxation Cervical  Esophageal Phase - Comment Cervical Esophageal Comment: contained at [facility] MS, CCC-SLP [phone] [facility] 10/09/2013, 8:05 PM     Scheduled Meds: . allopurinol  150 mg Oral QHS  . atenolol  12.5 mg Oral Daily  . cefTRIAXone (ROCEPHIN)  IV  1 g Intravenous Q24H  . feeding supplement (RESOURCE BREEZE)  1 Container Oral BID BM  . heparin  5,000 Units Subcutaneous 3 times per day  . simvastatin  40 mg Oral QHS  . sodium chloride  3 mL Intravenous Q12H   Continuous Infusions:    Active Problems:   CAP (community acquired pneumonia)   Community acquired pneumonia   Acute respiratory failure   Hypokalemia   COPD with exacerbation   Protein-calorie malnutrition, severe    Time spent: >35 minutes     Esperanza Sheets  [facility] Pager [phone]. If 7PM-7AM, please contact night-coverage at [email or web address],  password [facility] 10/11/2013, 8:24 AM  LOS: 4 days

## 2013-10-11 NOTE — Progress Notes (Signed)
Report called to Shinita, RN 

## 2013-10-11 NOTE — Progress Notes (Signed)
Pt admitted to the unit. Pt is alert and oriented. Pt oriented to room, staff, and call bell. Bed in lowest position. Full assessment to Epic. Call bell with in reach. Told to call for assists. Will continue to monitor.  [name] E  

## 2013-10-12 LAB — CBC
HEMATOCRIT: 31 % — AB (ref 36.0–46.0)
Hemoglobin: 10.2 g/dL — ABNORMAL LOW (ref 12.0–15.0)
MCH: 29.2 pg (ref 26.0–34.0)
MCHC: 32.9 g/dL (ref 30.0–36.0)
MCV: 88.8 fL (ref 78.0–100.0)
PLATELETS: 254 10*3/uL (ref 150–400)
RBC: 3.49 MIL/uL — ABNORMAL LOW (ref 3.87–5.11)
RDW: 14.4 % (ref 11.5–15.5)
WBC: 10.9 10*3/uL — ABNORMAL HIGH (ref 4.0–10.5)

## 2013-10-12 LAB — CULTURE, BLOOD (ROUTINE X 2)

## 2013-10-12 MED ORDER — DEXTROSE 5 % IV SOLN
2.0000 g | INTRAVENOUS | Status: DC
  Administered 2013-10-13 – 2013-10-17 (×5): 2 g via INTRAVENOUS
  Filled 2013-10-12 (×5): qty 2

## 2013-10-12 NOTE — Progress Notes (Signed)
10/11/13 1400  SLP Visit Information  SLP Received On 10/12/13  General Information  HPI 80 y/o female with PMH significant for HTN, arthritis, gout, bronchitis, and repeat PNA.  Brought to ED with pleuritic chest pain.  CXR: background pattern of chronic pulmonary scarring.  Bronchopneumonia within right lower lobe with consolidation and volume loss. Patient reports dysphagia as "getting strangled during meals every day.  MBS indicated from BSE to assess risk for aspiration due to repeated PNA.    Temperature Spikes Noted No  Respiratory Status Nasal cannula  Oral Cavity - Dentition Missing dentition  Patient observed directly with PO's Yes  Type of PO's observed Nectar-thick liquids  Feeding Able to feed self  Liquids provided via Cup  Treatment Provided  Treatment provided Dysphagia- Patient demonstrated successful follow through of strategies after minimal verbal cueing. There was still wet vocal quality audible if pt did not complete second swallow strategy. Recommend continuation of current diet recs and strategies. Pt in agreement.   Dysphagia Treatment  Treatment Methods Skilled observation;Compensation strategy training  Pharyngeal Phase Signs & Symptoms Wet vocal quality;Multiple swallows  Type of cueing Verbal;Visual  Amount of cueing Minimal  SLP - End of Session  Patient left in chair  Nurse Communication Diet recommendation;Swallow strategies reviewed  Assessment / Recommendations / Plan  Plan Continue with current plan of care  Dysphagia Recommendations  Diet recommendations Dysphagia 3 (mechanical soft);Nectar-thick liquid  Liquids provided via Cup  Medication Administration Crushed with puree  Supervision Patient able to self feed;Full supervision/cueing for compensatory strategies  Compensations Slow rate;Small sips/bites;Effortful swallow;Multiple dry swallows after each bite/sip;Clear throat intermittently  Postural Changes and/or Swallow Maneuvers Seated upright 90  degrees;Upright 30-60 min after meal  General Recommendations  Oral Care Recommendations Oral care BID  Follow up Recommendations Skilled Nursing facility  Progression Toward Goals  Progression toward goals Progressing toward goals  SLP Time Calculation  SLP Start Time 1400  SLP Stop Time 1420  SLP Time Calculation (min) 20 min  SLP Evaluations  $ SLP Speech Visit 1 Procedure  SLP Evaluations  $Swallowing Treatment 1 Procedure

## 2013-10-12 NOTE — Consult Note (Signed)
CARDIOLOGY CONSULT NOTE   Patient ID: [name] MRN: [id number], DOB/AGE: 01/24/1923   Admit date: 10/07/2013 Date of Consult: 10/12/2013   Primary Physician: Pearson Grippe, MD Primary Cardiologist: None  Pt. Profile  Right atrial mass  Problem List  Past Medical History  Diagnosis Date  . Hypertension   . Arthritis   . Gout     Past Surgical History  Procedure Laterality Date  . Appendectomy      Allergies  Allergies  Allergen Reactions  . Penicillins Rash   HPI   A 80 year old female with h/o hypertension that was admitted with CAP complicated by bacteremia with blood cultures positive for hemophillus influenzae. Echocardiogram showed a mass in the right atrium that was described as possible thrombus.  The primary team is also considering tricuspid valve endocarditis in the settings of hemophillus bacteremia. The patient complains of chest pain on deep inspiration and some SOB. She still feels very tired. She denies palpitations or syncope, no recent weight loss. No fevers or chills.     Inpatient Medications  . allopurinol  150 mg Oral QHS  . atenolol  12.5 mg Oral Daily  . [START ON 10/13/2013] cefTRIAXone (ROCEPHIN)  IV  2 g Intravenous Q24H  . feeding supplement (RESOURCE BREEZE)  1 Container Oral BID BM  . heparin  5,000 Units Subcutaneous 3 times per day  . simvastatin  40 mg Oral QHS  . sodium chloride  3 mL Intravenous Q12H   Family History Family History  Problem Relation Age of Onset  . Cancer Mother   . Stroke Father     Review of Systems  General:  No chills, fever, night sweats or weight changes.  Cardiovascular:  No chest pain, dyspnea on exertion, edema, orthopnea, palpitations, paroxysmal nocturnal dyspnea. Dermatological: No rash, lesions/masses Respiratory: No cough, dyspnea Urologic: No hematuria, dysuria Abdominal:   No nausea, vomiting, diarrhea, bright red blood per rectum, melena, or hematemesis Neurologic:  No visual changes,  wkns, changes in mental status. All other systems reviewed and are otherwise negative except as noted above.  Physical Exam  Blood pressure 109/64, pulse 78, temperature 98.3 F (36.8 C), temperature source Oral, resp. rate 22, height 5\' 5"  (1.651 m), weight 110 lb 7.2 oz (50.1 kg), SpO2 97.00%.  General: Pleasant, NAD Psych: Normal affect. Neuro: Alert and oriented X 3. Moves all extremities spontaneously. HEENT: Normal  Neck: Supple without bruits or JVD. Lungs:  Resp regular and unlabored, rales at the right base. Heart: RRR no s3, s4, 3/6 holosystolic murmur. Abdomen: Soft, non-tender, non-distended, BS + x 4.  Extremities: No clubbing, cyanosis or edema. DP/PT/Radials 2+ and equal bilaterally.  Labs   Recent Labs  10/10/13 0825 10/10/13 1415 10/10/13 2022  TROPONINI <0.30 <0.30 <0.30   Lab Results  Component Value Date   WBC 10.9* 10/12/2013   HGB 10.2* 10/12/2013   HCT 31.0* 10/12/2013   MCV 88.8 10/12/2013   PLT 254 10/12/2013    Recent Labs Lab 10/08/13 0457  10/10/13 0311  NA 139  < > 142  K 4.1  < > 4.6  CL 103  < > 110  CO2 23  < > 22  BUN 36*  < > 21  CREATININE 1.17*  < > 0.82  CALCIUM 9.1  < > 9.0  PROT 5.7*  --   --   BILITOT 0.6  --   --   ALKPHOS 63  --   --   ALT 14  --   --  AST 25  --   --   GLUCOSE 110*  < > 125*  < > = values in this interval not displayed. Lab Results  Component Value Date   CHOL  Value: 141        ATP III CLASSIFICATION:  <200     mg/dL   Desirable  161-096  mg/dL   Borderline High  >=045    mg/dL   High        11/24/8117   HDL 71 10/12/2010   LDLCALC  Value: 60        Total Cholesterol/HDL:CHD Risk Coronary Heart Disease Risk Table                     Men   Women  1/2 Average Risk   3.4   3.3  Average Risk       5.0   4.4  2 X Average Risk   9.6   7.1  3 X Average Risk  23.4   11.0        Use the calculated Patient Ratio above and the CHD Risk Table to determine the patient's CHD Risk.        ATP III CLASSIFICATION (LDL):   <100     mg/dL   Optimal  147-829  mg/dL   Near or Above                    Optimal  130-159  mg/dL   Borderline  562-130  mg/dL   High  >865     mg/dL   Very High 7/84/6962   TRIG 52 10/12/2010   Radiology/Studies  Dg Chest 2 View  10/07/2013   CLINICAL DATA:  Cough.  Chest congestion.  Chest pain.  EXAM: CHEST  2 VIEW  COMPARISON:  06/09/2013  FINDINGS: Heart size is normal. The left lung shows chronic scarring. There is a infiltrate and volume loss in the right lower lobe consistent with bronchopneumonia. Right upper lobe remains clear. No effusions. No significant bony finding.  IMPRESSION: Background pattern of chronic pulmonary scarring. Bronchopneumonia within the right lower lobe with consolidation and volume loss.   Electronically Signed   By: Paulina Fusi M.D.   On: 10/07/2013 11:58    Echocardiogram 10/08/2013  - Left ventricle: The cavity size was normal. Wall thickness was normal. Systolic function was normal. The estimated ejection fraction was in the range of 55% to 60%. Wall motion was normal; there were no regional wall motion abnormalities. Doppler parameters are consistent with abnormal left ventricular relaxation (grade 1 diastolic dysfunction). - Aortic valve: Valve mobility was restricted. There was mild stenosis. - Mitral valve: Calcified annulus. - Tricuspid valve: Moderate regurgitation. - Pulmonary arteries: Systolic pressure was mildly increased. PA peak pressure: 31mm Hg (S). Impressions:  - Possible catheter in right atrium; if patient does not have a catheter, consider cardiac MRI to R/O mass.   ECG: NSR, normal ECG    ASSESSMENT AND PLAN  A very pleasant 80 year old female who was admitted with CAP and hemophillus bacteremia with finding of right atrial mass and worsening tricuspid regurgitation.   I personally reviewed the echocardiogram, there is a prominent ridge in the right atrium running from IVC to the septum. This is consistent with a  prominent remnant Eustachian valve. When reviewing her prior echocardiogram from 01/22/2010 the structure was present as well and it is unchanged in appearance.  Considering tricuspid regurgitation, it is worse on the current  echocardiogram (previosly mild, now moderate). There is no obvious vegetation seen on the transthoracic echocardiogram, however TEE would be recommended for better visualization of all leaflets of the tricuspid valve.  Signed, Lars MassonNELSON, Amiracle Neises, H, MD, [facility] InstituteFACC 10/12/2013, 3:12 PM

## 2013-10-12 NOTE — Progress Notes (Signed)
PATIENT DETAILS Name: CANDIDA VETTER Age: 80 y.o. Sex: female Date of Birth: Feb 22, 1923 Admit Date: 10/07/2013 Admitting Physician Marinda Elk, MD PCP:KIM, Fayrene Fearing, MD  Subjective: No major issues, seems to have improved rapidly.  Assessment/Plan: Active Problems:   CAP (community acquired pneumonia) - Patient was admitted and started on vancomycin, cefepime and Levaquin initially. However now has been added just to Rocephin. Blood cultures are positive for Haemophilus influenzae. Will plan for at least 2 weeks of IV antibiotics with Rocephin from 10/11/13.  Haemophilus influenza bacteremia - Likely secondary to above. - 2-D echocardiogram showing? Thrombus in the right atrium with tricuspid regurgitation. Discussed with infectious disease, this organism is not typically associated with endocarditis. However may need a cardiac MRI or a TEE to evaluate whether or not she has a myxoma/thrombus. [facility] consult cardiology.  ? Thrombus in the right atrium - Await cardiology evaluation  History of chronic bronchitis/COPD - Shortness of breath likely secondary to pneumonia, not wheezing. Continue with inhaled bronchodilators and oxygen. - Taper off oxygen as tolerated.  Hypertension - Blood pressure controlled with just atenolol  Dyslipidemia - Continue statins  History of gout - Stable with allopurinol   Protein-calorie malnutrition, severe - Resource breeze twice a day  Disposition: Remain inpatient  DVT Prophylaxis: Prophylactic Heparin  Code Status: Full code   Family Communication None at bedside  Procedures:  None  CONSULTS:  cardiology  Time spent 40 minutes-which includes 50% of the time with face-to-face with patient/ family and coordinating care related to the above assessment and plan.    MEDICATIONS: Scheduled Meds: . allopurinol  150 mg Oral QHS  . atenolol  12.5 mg Oral Daily  . cefTRIAXone (ROCEPHIN)  IV  1 g Intravenous Q24H    . feeding supplement (RESOURCE BREEZE)  1 Container Oral BID BM  . heparin  5,000 Units Subcutaneous 3 times per day  . simvastatin  40 mg Oral QHS  . sodium chloride  3 mL Intravenous Q12H   Continuous Infusions:  PRN Meds:.acetaminophen, acetaminophen, albuterol, food thickener, ondansetron (ZOFRAN) IV, ondansetron, oxyCODONE-acetaminophen, polyethylene glycol, sodium chloride, sodium chloride  Antibiotics: Anti-infectives   Start     Dose/Rate Route Frequency Ordered Stop   10/11/13 1000  cefTRIAXone (ROCEPHIN) 1 g in dextrose 5 % 50 mL IVPB     1 g 100 mL/hr over 30 Minutes Intravenous Every 24 hours 10/10/13 1509     10/09/13 1300  levofloxacin (LEVAQUIN) IVPB 750 mg  Status:  Discontinued     750 mg 100 mL/hr over 90 Minutes Intravenous Every 48 hours 10/09/13 1218 10/10/13 1508   10/08/13 1630  vancomycin (VANCOCIN) 500 mg in sodium chloride 0.9 % 100 mL IVPB  Status:  Discontinued     500 mg 100 mL/hr over 60 Minutes Intravenous Every 24 hours 10/08/13 1624 10/09/13 1211   10/08/13 1300  vancomycin (VANCOCIN) 500 mg in sodium chloride 0.9 % 100 mL IVPB  Status:  Discontinued     500 mg 100 mL/hr over 60 Minutes Intravenous Every 24 hours 10/07/13 1242 10/07/13 1637   10/08/13 1200  ceFEPIme (MAXIPIME) 1 g in dextrose 5 % 50 mL IVPB  Status:  Discontinued     1 g 100 mL/hr over 30 Minutes Intravenous Every 24 hours 10/08/13 1049 10/10/13 1508   10/07/13 1800  cefTRIAXone (ROCEPHIN) 1 g in dextrose 5 % 50 mL IVPB  Status:  Discontinued     1 g 100 mL/hr  over 30 Minutes Intravenous Every 24 hours 10/07/13 1723 10/08/13 1000   10/07/13 1800  azithromycin (ZITHROMAX) tablet 500 mg  Status:  Discontinued     500 mg Oral Every 24 hours 10/07/13 1723 10/08/13 1000   10/07/13 1245  vancomycin (VANCOCIN) IVPB 1000 mg/200 mL premix     1,000 mg 200 mL/hr over 60 Minutes Intravenous  Once 10/07/13 1242 10/07/13 1419   10/07/13 1215  ceFEPIme (MAXIPIME) 1 g in dextrose 5 % 50 mL IVPB      1 g 100 mL/hr over 30 Minutes Intravenous  Once 10/07/13 1211 10/07/13 1537       PHYSICAL EXAM: Vital signs in last 24 hours: Filed Vitals:   10/11/13 2100 10/11/13 2248 10/12/13 0548 10/12/13 1334  BP: 124/69  130/66 109/64  Pulse: 85  90 78  Temp: 98.5 F (36.9 C)  98.9 F (37.2 C) 98.3 F (36.8 C)  TempSrc: Oral  Oral Oral  Resp: 28  25 22   Height: 5\' 5"  (1.651 m)     Weight: 50.1 kg (110 lb 7.2 oz)     SpO2: 91% 98% 97% 97%    Weight change: -1.6 kg (-3 lb 8.4 oz) Filed Weights   10/10/13 0335 10/11/13 0500 10/11/13 2100  Weight: 51.5 kg (113 lb 8.6 oz) 51.7 kg (113 lb 15.7 oz) 50.1 kg (110 lb 7.2 oz)   Body mass index is 18.38 kg/(m^2).   Gen Exam: Awake and alert with clear speech.   Neck: Supple, No JVD.   Chest: B/L Clear.   CVS: S1 S2 Regular, no murmurs.  Abdomen: soft, BS +, non tender, non distended.  Extremities: no edema, lower extremities warm to touch. Neurologic: Non Focal.   Skin: No Rash.   Wounds: N/A.    Intake/Output from previous day:  Intake/Output Summary (Last 24 hours) at 10/12/13 1407 Last data filed at 10/12/13 1300  Gross per 24 hour  Intake    118 ml  Output    100 ml  Net     18 ml     LAB RESULTS: CBC  Recent Labs Lab 10/07/13 1129  10/08/13 0457 10/09/13 0318 10/10/13 0311 10/11/13 0253 10/12/13 0451  WBC 13.5*  < > 16.4* 21.8* 20.0* 15.7* 10.9*  HGB 12.1  < > 10.4* 11.3* 10.2* 10.3* 10.2*  HCT 35.6*  < > 31.3* 33.7* 31.1* 30.9* 31.0*  PLT 288  < > 269 281 260 232 254  MCV 87.9  < > 88.4 88.9 89.1 88.5 88.8  MCH 29.9  < > 29.4 29.8 29.2 29.5 29.2  MCHC 34.0  < > 33.2 33.5 32.8 33.3 32.9  RDW 13.9  < > 13.9 14.2 14.3 14.3 14.4  LYMPHSABS 0.5*  --   --   --   --   --   --   MONOABS 0.8  --   --   --   --   --   --   EOSABS 0.0  --   --   --   --   --   --   BASOSABS 0.0  --   --   --   --   --   --   < > = values in this interval not displayed.  Chemistries   Recent Labs Lab 10/07/13 1145  10/07/13 1859 10/08/13 0457 10/09/13 0318 10/10/13 0311  NA 140  --  139 139 142  K 2.9*  --  4.1 4.6 4.6  CL 101  --  103 108 110  CO2  --   --  23 20 22   GLUCOSE 125*  --  110* 131* 125*  BUN 31*  --  36* 34* 21  CREATININE 1.10 1.06 1.17* 0.93 0.82  CALCIUM  --   --  9.1 9.0 9.0    CBG: No results found for this basename: GLUCAP,  in the last 168 hours  GFR Estimated Creatinine Clearance: 37.5 ml/min (by C-G formula based on Cr of 0.82).  Coagulation profile No results found for this basename: INR, PROTIME,  in the last 168 hours  Cardiac Enzymes  Recent Labs Lab 10/10/13 0825 10/10/13 1415 10/10/13 2022  TROPONINI <0.30 <0.30 <0.30    No components found with this basename: POCBNP,  No results found for this basename: DDIMER,  in the last 72 hours No results found for this basename: HGBA1C,  in the last 72 hours No results found for this basename: CHOL, HDL, LDLCALC, TRIG, CHOLHDL, LDLDIRECT,  in the last 72 hours No results found for this basename: TSH, T4TOTAL, FREET3, T3FREE, THYROIDAB,  in the last 72 hours No results found for this basename: VITAMINB12, FOLATE, FERRITIN, TIBC, IRON, RETICCTPCT,  in the last 72 hours No results found for this basename: LIPASE, AMYLASE,  in the last 72 hours  Urine Studies No results found for this basename: UACOL, UAPR, USPG, UPH, UTP, UGL, UKET, UBIL, UHGB, UNIT, UROB, ULEU, UEPI, UWBC, URBC, UBAC, CAST, CRYS, UCOM, BILUA,  in the last 72 hours  MICROBIOLOGY: Recent Results (from the past 240 hour(s))  CULTURE, BLOOD (ROUTINE X 2)     Status: None   Collection Time    10/07/13 12:39 PM      Result Value Ref Range Status   Specimen Description BLOOD RIGHT HAND   Final   Special Requests BOTTLES DRAWN AEROBIC AND ANAEROBIC 5CC   Final   Culture  Setup Time     Final   Value: 10/07/2013 16:50     Performed at [facility]   Culture     Final   Value: HAEMOPHILUS INFLUENZAE     Note: BETA LACTAMASE NEGATIVE  Referred to [facility] [place] Ser[place]. CRITICAL RESULT CALLED TO, READ BACK BY AND VERIFIED WITH: JENNIFER MAY @ 1004 10/10/13 BY KRAWS FAXED TO CONNIE WEANT AT [facility] 10/12/13      0926 BY PEAKY     Note: Gram Stain Report Called to,Read Back By and Verified With: Isidor Holts 10/08/13 @ 12:23PM BY RUSCOE A.     Performed at [facility]   Report Status PENDING   Incomplete  CULTURE, BLOOD (ROUTINE X 2)     Status: None   Collection Time    10/07/13 12:45 PM      Result Value Ref Range Status   Specimen Description BLOOD LEFT HAND   Final   Special Requests BOTTLES DRAWN AEROBIC ONLY 5CC   Final   Culture  Setup Time     Final   Value: 10/07/2013 16:51     Performed at [facility]   Culture     Final   Value: HAEMOPHILUS INFLUENZAE     Note: BETA LACTAMASE NEGATIVE Previous Specimen Sent to [facility] for Typing. FAXED TO CONNIE WEANT AT [facility] 10/12/13 0926 BY PEAKY     Note: Gram Stain Report Called to,Read Back By and Verified With: Isidor Holts 10/08/13 @ 12:23PM BY RUSCOE A.     Performed at [facility]   Report Status 10/12/2013 FINAL   Final  CULTURE, BLOOD (ROUTINE X 2)     Status: None   Collection Time    10/10/13  8:22 PM      Result Value Ref Range Status   Specimen Description BLOOD RIGHT FOREARM   Final   Special Requests BOTTLES DRAWN AEROBIC ONLY 8CC   Final   Culture  Setup Time     Final   Value: 10/11/2013 00:58     Performed at [facility]   Culture     Final   Value:        BLOOD CULTURE RECEIVED NO GROWTH TO DATE CULTURE WILL BE HELD FOR 5 DAYS BEFORE ISSUING A FINAL NEGATIVE REPORT     Performed at [facility]   Report Status PENDING   Incomplete  CULTURE, BLOOD (ROUTINE X 2)     Status: None   Collection Time    10/10/13  8:25 PM      Result Value Ref Range Status   Specimen Description BLOOD LEFT FOREARM   Final   Special Requests BOTTLES DRAWN AEROBIC ONLY 10CC   Final    Culture  Setup Time     Final   Value: 10/11/2013 00:58     Performed at [facility]   Culture     Final   Value:        BLOOD CULTURE RECEIVED NO GROWTH TO DATE CULTURE WILL BE HELD FOR 5 DAYS BEFORE ISSUING A FINAL NEGATIVE REPORT     Performed at [facility]   Report Status PENDING   Incomplete    RADIOLOGY STUDIES/RESULTS: Dg Chest 2 View  10/07/2013   CLINICAL DATA:  Cough.  Chest congestion.  Chest pain.  EXAM: CHEST  2 VIEW  COMPARISON:  06/09/2013  FINDINGS: Heart size is normal. The left lung shows chronic scarring. There is a infiltrate and volume loss in the right lower lobe consistent with bronchopneumonia. Right upper lobe remains clear. No effusions. No significant bony finding.  IMPRESSION: Background pattern of chronic pulmonary scarring. Bronchopneumonia within the right lower lobe with consolidation and volume loss.   Electronically Signed   By: Paulina Fusi M.D.   On: 10/07/2013 11:58   Dg Swallowing Func-speech Pathology  10/09/2013   Lacinda Axon, CCC-SLP     10/09/2013  8:28 PM Objective Swallowing Evaluation: Modified Barium Swallowing Study   Patient Details  Name: TAMULA MORRICAL MRN: [id number] Date of Birth: [birth year]  Today's Date: 10/09/2013 Time: 1530-1600 SLP Time Calculation (min): 30 min  Past Medical History:  Past Medical History  Diagnosis Date  . Hypertension   . Arthritis   . Gout    Past Surgical History:  Past Surgical History  Procedure Laterality Date  . Appendectomy     HPI:  80 y/o female with PMH significant for HTN, arthritis, gout,  bronchitis, and repeat PNA.  Brought to ED with pleuritic chest  pain.  CXR: background pattern of chronic pulmonary scarring.   Bronchopneumonia within right lower lobe with consolidation and  volume loss. Patient reports dysphagia as "getting strangled  during meals every day.  MBS indicated from BSE to assess risk  for aspiration due to repeated PNA.       Assessment / Plan / Recommendation Clinical  Impression  Dysphagia Diagnosis: Mild oral phase dysphagia;Mild pharyngeal  phase dysphagia;Mild cervical esophageal phase dysphagia.  Oral phase marked by piecemeal swallows and delayed oral transit  with all liquid and solid consistencies.   Mild to moderate  pharyngeal phase dysphagia with esophageal involvement.   All  swallows triggered at valleculae with immediate backflow to  pyriforms s/p swallow of thin, nectar, and puree consistencies.   During second swallow, backflow,from pyriforms spill in airway  resulting in trace silent penetration during swallow with  eventual trace aspiration with thin liquid barium by spoon, cup,  and straw.  Cued throat clear and cough cleared majority of  penetrates and aspirated material.  Chin tuck not effective in  eliminating penetration/aspiration.  Minimal residuals in  vallecular space and pyriforms s/p swallow of thin, nectar, and  puree consistency. Strategies of multiple swallows effective in  clearing residuals.   Appears to be osteophytes at C3 and C6 no  radiologist present to confirm.  Prominent CP muscle noted.   Brief esophageal screen indicates slow bolus transit with  backflow to cervical esophagus noted.  Containment of whole  barium tablet noted at LES.  Recommend to continue current diet  consistency of dysphagia 3 and nectar thick liquids with full  supervision with all meals.  Recommend aspiration and reflux  precautions as risk remains s/p swallow due to noted backflow.   ST to continue in acute care setting for diet tolerance.   Recommend Skilled ST at next level of care.  Repeat MBS with  clinical improvement prior to liquid upgrade as aspiration events  silent.  May benefit from GI consult to further assess esophageal  functioning.      Treatment Recommendation  Therapy as outlined in treatment plan below    Diet Recommendation Dysphagia 3 (Mechanical Soft);Nectar-thick  liquid   Liquid Administration via: Cup;No straw Medication Administration:  Crushed with puree Supervision: Patient able to self feed;Full supervision/cueing  for compensatory strategies Compensations: Slow rate;Small sips/bites;Effortful  swallow;Multiple dry swallows after each bite/sip;Clear throat  intermittently Postural Changes and/or Swallow Maneuvers: Seated upright 90  degrees;Upright 30-60 min after meal    Other  Recommendations Recommended Consults: Consider GI  evaluation Oral Care Recommendations: Oral care Q4 per protocol Other Recommendations: Order thickener from pharmacy;Prohibited  food (jello, ice cream, thin soups);Remove water pitcher;Clarify  dietary restrictions   Follow Up Recommendations  Home health SLP    Frequency and Duration min 2x/week  2 weeks       SLP Swallow Goals     General Date of Onset: 10/07/13 HPI: 80 y/o female with PMH significant for HTN, arthritis, gout,  bronchitis, and repeat PNA.  Brought to ED with pleuritic chest  pain.  CXR: background pattern of chronic pulmonary scarring.   Bronchopneumonia within right lower lobe with consolidation and  volume loss. Patient reports dysphagia as "getting strangled  during meals every day.  MBS indicated from BSE to assess risk  for aspiration due to repeated PNA.   Type of Study: Modified Barium Swallowing Study Reason for Referral: Objectively evaluate swallowing function Previous Swallow Assessment: BSE 10/08/13 Diet Prior to this Study: Dysphagia 3 (soft);Nectar-thick liquids Temperature Spikes Noted: No Respiratory Status: Nasal cannula History of Recent Intubation: No Behavior/Cognition: Alert;Cooperative;Pleasant mood Oral Cavity - Dentition: Missing dentition Oral Motor / Sensory Function: Impaired - see Bedside swallow  eval Self-Feeding Abilities: Able to feed self Patient Positioning: Upright in chair Baseline Vocal Quality: Clear Volitional Cough: Strong Volitional Swallow: Able to elicit Pharyngeal Secretions: Not observed secondary MBS    Reason for Referral Objectively evaluate swallowing  function   Oral Phase Oral Preparation/Oral Phase Oral Phase: Impaired Oral -  Nectar Oral - Nectar Teaspoon: Reduced posterior propulsion;Decreased  velopharyngeal closure;Piecemeal swallowing;Delayed oral transit Oral - Nectar Cup: Piecemeal swallowing;Reduced posterior  propulsion;Decreased velopharyngeal closure;Delayed oral transit Oral - Thin Oral - Thin Teaspoon: Incomplete tongue to palate  contact;Piecemeal swallowing;Reduced posterior  propulsion;Decreased velopharyngeal closure;Delayed oral transit Oral - Solids Oral - Puree: Piecemeal swallowing;Incomplete tongue to palate  contact;Decreased velopharyngeal closure Oral - Mechanical Soft: Piecemeal swallowing;Decreased  velopharyngeal closure;Reduced posterior propulsion;Delayed oral  transit   Pharyngeal Phase Pharyngeal Phase Pharyngeal Phase: Impaired Pharyngeal - Nectar Pharyngeal - Nectar Teaspoon: Reduced pharyngeal  peristalsis;Reduced anterior laryngeal mobility;Reduced laryngeal  elevation;Premature spillage to valleculae;Reduced  airway/laryngeal closure;Reduced tongue base  retraction;Pharyngeal residue - pyriform sinuses;Pharyngeal  residue - valleculae;Pharyngeal residue - cp segment Pharyngeal - Nectar Cup: Reduced tongue base retraction;Reduced  pharyngeal peristalsis;Pharyngeal residue - cp segment;Reduced  anterior laryngeal mobility;Premature spillage to  valleculae;Reduced airway/laryngeal closure;Reduced laryngeal  elevation Pharyngeal - Thin Pharyngeal - Thin Teaspoon: Reduced pharyngeal  peristalsis;Reduced tongue base retraction;Penetration/Aspiration  during swallow;Penetration/Aspiration after swallow;Reduced  anterior laryngeal mobility;Reduced laryngeal elevation;Premature  spillage to valleculae;Reduced airway/laryngeal closure;Trace  aspiration;Pharyngeal residue - pyriform sinuses Penetration/Aspiration details (thin teaspoon): Material enters  airway, remains ABOVE vocal cords and not ejected out;Material  enters airway,  passes BELOW cords without attempt by patient to  eject out (silent aspiration) Pharyngeal - Thin Cup: Reduced pharyngeal peristalsis;Reduced  tongue base retraction;Penetration/Aspiration during  swallow;Reduced anterior laryngeal mobility;Premature spillage to  valleculae;Reduced laryngeal elevation;Reduced airway/laryngeal  closure Penetration/Aspiration details (thin cup): Material enters  airway, CONTACTS cords and not ejected out;Material enters  airway, remains ABOVE vocal cords and not ejected out;Material  enters airway, passes BELOW cords without attempt by patient to  eject out (silent aspiration) Pharyngeal - Thin Straw: Reduced tongue base retraction;Reduced  pharyngeal peristalsis;Reduced anterior laryngeal  mobility;Reduced laryngeal elevation;Premature spillage to  valleculae;Reduced airway/laryngeal closure;Trace  aspiration;Pharyngeal residue - cp segment;Penetration/Aspiration  during swallow Penetration/Aspiration details (thin straw): Material enters  airway, passes BELOW cords without attempt by patient to eject  out (silent aspiration);Material enters airway, CONTACTS cords  and not ejected out;Material enters airway, remains ABOVE vocal  cords and not ejected out Pharyngeal - Solids Pharyngeal - Puree: Reduced tongue base retraction;Reduced  pharyngeal peristalsis;Reduced anterior laryngeal  mobility;Reduced laryngeal elevation;Reduced airway/laryngeal  closure;Pharyngeal residue - valleculae;Pharyngeal residue - cp  segment Pharyngeal - Mechanical Soft: Reduced tongue base  retraction;Pharyngeal residue - cp segment;Delayed swallow  initiation;Premature spillage to valleculae;Reduced  airway/laryngeal closure;Reduced laryngeal elevation;Pharyngeal  residue - valleculae  Cervical Esophageal Phase    GO    Cervical Esophageal Phase Cervical Esophageal Phase: Impaired Cervical Esophageal Phase - Solids Puree: Prominent cricopharyngeal segment;Esophageal backflow into  cervical esophagus Pill:  Prominent cricopharyngeal segment;Other (Comment);Reduced  cricopharyngeal relaxation Cervical Esophageal Phase - Comment Cervical Esophageal Comment: contained at [facility] MS, CCC-SLP [phone] [facility] 10/09/2013, 8:05 PM     Jeoffrey Massed, MD  [facility] Pager:[phone]  If 7PM-7AM, please contact night-coverage [email or web address] Password [email or web address] 10/12/2013, 2:07 PM   LOS: 5 days

## 2013-10-13 ENCOUNTER — Encounter (HOSPITAL_COMMUNITY)
Admission: EM | Disposition: A | Discharge status: Skilled Nursing Facility | Payer: Medicare HMO | Source: Home / Self Care | Attending: Internal Medicine

## 2013-10-13 ENCOUNTER — Encounter (HOSPITAL_COMMUNITY): Payer: Self-pay | Admitting: *Deleted

## 2013-10-13 HISTORY — PX: TEE WITHOUT CARDIOVERSION: SHX5443

## 2013-10-13 SURGERY — ECHOCARDIOGRAM, TRANSESOPHAGEAL
Anesthesia: Moderate Sedation

## 2013-10-13 MED ORDER — LIDOCAINE VISCOUS 2 % MT SOLN
OROMUCOSAL | Status: DC | PRN
  Administered 2013-10-13: 15 mL via OROMUCOSAL

## 2013-10-13 MED ORDER — BUTAMBEN-TETRACAINE-BENZOCAINE 2-2-14 % EX AERO
INHALATION_SPRAY | CUTANEOUS | Status: DC | PRN
  Administered 2013-10-13: 2 via TOPICAL

## 2013-10-13 MED ORDER — MIDAZOLAM HCL 5 MG/ML IJ SOLN
INTRAMUSCULAR | Status: AC
  Filled 2013-10-13: qty 1

## 2013-10-13 MED ORDER — LIDOCAINE VISCOUS 2 % MT SOLN
OROMUCOSAL | Status: AC
  Filled 2013-10-13: qty 15

## 2013-10-13 MED ORDER — FENTANYL CITRATE 0.05 MG/ML IJ SOLN
INTRAMUSCULAR | Status: AC
  Filled 2013-10-13: qty 2

## 2013-10-13 MED ORDER — MIDAZOLAM HCL 10 MG/2ML IJ SOLN
INTRAMUSCULAR | Status: DC | PRN
  Administered 2013-10-13 (×3): 1 mg via INTRAVENOUS

## 2013-10-13 MED ORDER — SODIUM CHLORIDE 0.9 % IJ SOLN
10.0000 mL | INTRAMUSCULAR | Status: DC | PRN
  Administered 2013-10-14 (×2): 20 mL
  Administered 2013-10-15 – 2013-10-16 (×3): 10 mL
  Administered 2013-10-17: 20 mL
  Administered 2013-10-17: 10 mL

## 2013-10-13 MED ORDER — SODIUM CHLORIDE 0.9 % IV SOLN
INTRAVENOUS | Status: DC
  Administered 2013-10-15: 22:00:00 via INTRAVENOUS

## 2013-10-13 MED ORDER — FENTANYL CITRATE 0.05 MG/ML IJ SOLN
INTRAMUSCULAR | Status: DC | PRN
  Administered 2013-10-13 (×2): 12.5 ug via INTRAVENOUS

## 2013-10-13 NOTE — Progress Notes (Signed)
PATIENT DETAILS Name: ANNE BOLTZ Age: 80 y.o. Sex: female Date of Birth: 1923/06/12 Admit Date: 10/07/2013 Admitting Physician Marinda Elk, MD PCP:KIM, Fayrene Fearing, MD  Subjective: No major issues overnight  Assessment/Plan: Active Problems:   CAP (community acquired pneumonia) - Patient was admitted and started on vancomycin, cefepime and Levaquin initially. However now has been tapered to Rocephin. Blood cultures are positive for Haemophilus influenzae. Will plan for at least 2 weeks of IV antibiotics with Rocephin from 10/11/13 if TEE negative, however since TEE unable to be done, which is empirically treat for 4 weeks.  Haemophilus influenza bacteremia - Likely secondary to above. - 2-D echocardiogram showing? Thrombus in the right atrium with tricuspid regurgitation. Discussed with infectious disease (Dr Drue Second), this organism is not typically associated with endocarditis. TEE attempted on 2/26, unsuccessful. Plans were initially to treat for 2 weeks if TEE was negative, and for 4 weeks if TEE was positive. Since TEE not able to be done, will just empirically treat for 4 weeks.  ? Thrombus in the right atrium - Cardiology note on 2/25, apparently does structures seen in the right atrium is likely to be a prominent remnant of the eustachian valve. Apparently this was seen on her prior echocardiogram on 01/22/2010. - Will defer to cardiology to see if patient needs a MRI.  History of chronic bronchitis/COPD - Shortness of breath likely secondary to pneumonia, not wheezing. Continue with inhaled bronchodilators and oxygen. - Taper off oxygen as tolerated.  Hypertension - Blood pressure controlled with just atenolol  Dyslipidemia - Continue statins  History of gout - Stable with allopurinol   Protein-calorie malnutrition, severe - Resource breeze twice a day  Disposition: Remain inpatient  DVT Prophylaxis: Prophylactic Heparin  Code Status: Full code    Family Communication None at bedside  Procedures:  None  CONSULTS:  cardiology   MEDICATIONS: Scheduled Meds: . allopurinol  150 mg Oral QHS  . atenolol  12.5 mg Oral Daily  . cefTRIAXone (ROCEPHIN)  IV  2 g Intravenous Q24H  . feeding supplement (RESOURCE BREEZE)  1 Container Oral BID BM  . heparin  5,000 Units Subcutaneous 3 times per day  . simvastatin  40 mg Oral QHS  . sodium chloride  3 mL Intravenous Q12H   Continuous Infusions: . sodium chloride     PRN Meds:.acetaminophen, acetaminophen, albuterol, food thickener, ondansetron (ZOFRAN) IV, ondansetron, oxyCODONE-acetaminophen, polyethylene glycol, sodium chloride, sodium chloride  Antibiotics: Anti-infectives   Start     Dose/Rate Route Frequency Ordered Stop   10/13/13 1000  cefTRIAXone (ROCEPHIN) 2 g in dextrose 5 % 50 mL IVPB     2 g 100 mL/hr over 30 Minutes Intravenous Every 24 hours 10/12/13 1437     10/11/13 1000  cefTRIAXone (ROCEPHIN) 1 g in dextrose 5 % 50 mL IVPB  Status:  Discontinued     1 g 100 mL/hr over 30 Minutes Intravenous Every 24 hours 10/10/13 1509 10/12/13 1437   10/09/13 1300  levofloxacin (LEVAQUIN) IVPB 750 mg  Status:  Discontinued     750 mg 100 mL/hr over 90 Minutes Intravenous Every 48 hours 10/09/13 1218 10/10/13 1508   10/08/13 1630  vancomycin (VANCOCIN) 500 mg in sodium chloride 0.9 % 100 mL IVPB  Status:  Discontinued     500 mg 100 mL/hr over 60 Minutes Intravenous Every 24 hours 10/08/13 1624 10/09/13 1211   10/08/13 1300  vancomycin (VANCOCIN) 500 mg in sodium chloride 0.9 % 100  mL IVPB  Status:  Discontinued     500 mg 100 mL/hr over 60 Minutes Intravenous Every 24 hours 10/07/13 1242 10/07/13 1637   10/08/13 1200  ceFEPIme (MAXIPIME) 1 g in dextrose 5 % 50 mL IVPB  Status:  Discontinued     1 g 100 mL/hr over 30 Minutes Intravenous Every 24 hours 10/08/13 1049 10/10/13 1508   10/07/13 1800  cefTRIAXone (ROCEPHIN) 1 g in dextrose 5 % 50 mL IVPB  Status:  Discontinued      1 g 100 mL/hr over 30 Minutes Intravenous Every 24 hours 10/07/13 1723 10/08/13 1000   10/07/13 1800  azithromycin (ZITHROMAX) tablet 500 mg  Status:  Discontinued     500 mg Oral Every 24 hours 10/07/13 1723 10/08/13 1000   10/07/13 1245  vancomycin (VANCOCIN) IVPB 1000 mg/200 mL premix     1,000 mg 200 mL/hr over 60 Minutes Intravenous  Once 10/07/13 1242 10/07/13 1419   10/07/13 1215  ceFEPIme (MAXIPIME) 1 g in dextrose 5 % 50 mL IVPB     1 g 100 mL/hr over 30 Minutes Intravenous  Once 10/07/13 1211 10/07/13 1537       PHYSICAL EXAM: Vital signs in last 24 hours: Filed Vitals:   10/13/13 1050 10/13/13 1055 10/13/13 1100 10/13/13 1105  BP: 128/75 130/65 122/65 127/67  Pulse: 79 76 76 78  Temp:      TempSrc:      Resp: 28 26 27 28   Height:      Weight:      SpO2: 95% 94% 96% 96%    Weight change:  Filed Weights   10/10/13 0335 10/11/13 0500 10/11/13 2100  Weight: 51.5 kg (113 lb 8.6 oz) 51.7 kg (113 lb 15.7 oz) 50.1 kg (110 lb 7.2 oz)   Body mass index is 18.38 kg/(m^2).   Gen Exam: Awake and alert with clear speech.   Neck: Supple, No JVD.   Chest: Good air entry bilaterally, still has rales on the right lung base. CVS: S1 S2 Regular, no murmurs.  Abdomen: soft, BS +, non tender, non distended.  Extremities: no edema, lower extremities warm to touch. Neurologic: Non Focal.   Skin: No Rash.   Wounds: N/A.    Intake/Output from previous day:  Intake/Output Summary (Last 24 hours) at 10/13/13 1258 Last data filed at 10/13/13 1020  Gross per 24 hour  Intake      0 ml  Output    680 ml  Net   -680 ml     LAB RESULTS: CBC  Recent Labs Lab 10/07/13 1129  10/08/13 0457 10/09/13 0318 10/10/13 0311 10/11/13 0253 10/12/13 0451  WBC 13.5*  < > 16.4* 21.8* 20.0* 15.7* 10.9*  HGB 12.1  < > 10.4* 11.3* 10.2* 10.3* 10.2*  HCT 35.6*  < > 31.3* 33.7* 31.1* 30.9* 31.0*  PLT 288  < > 269 281 260 232 254  MCV 87.9  < > 88.4 88.9 89.1 88.5 88.8  MCH 29.9  < >  29.4 29.8 29.2 29.5 29.2  MCHC 34.0  < > 33.2 33.5 32.8 33.3 32.9  RDW 13.9  < > 13.9 14.2 14.3 14.3 14.4  LYMPHSABS 0.5*  --   --   --   --   --   --   MONOABS 0.8  --   --   --   --   --   --   EOSABS 0.0  --   --   --   --   --   --  BASOSABS 0.0  --   --   --   --   --   --   < > = values in this interval not displayed.  Chemistries   Recent Labs Lab 10/07/13 1145 10/07/13 1859 10/08/13 0457 10/09/13 0318 10/10/13 0311  NA 140  --  139 139 142  K 2.9*  --  4.1 4.6 4.6  CL 101  --  103 108 110  CO2  --   --  23 20 22   GLUCOSE 125*  --  110* 131* 125*  BUN 31*  --  36* 34* 21  CREATININE 1.10 1.06 1.17* 0.93 0.82  CALCIUM  --   --  9.1 9.0 9.0    CBG: No results found for this basename: GLUCAP,  in the last 168 hours  GFR Estimated Creatinine Clearance: 37.5 ml/min (by C-G formula based on Cr of 0.82).  Coagulation profile No results found for this basename: INR, PROTIME,  in the last 168 hours  Cardiac Enzymes  Recent Labs Lab 10/10/13 0825 10/10/13 1415 10/10/13 2022  TROPONINI <0.30 <0.30 <0.30    No components found with this basename: POCBNP,  No results found for this basename: DDIMER,  in the last 72 hours No results found for this basename: HGBA1C,  in the last 72 hours No results found for this basename: CHOL, HDL, LDLCALC, TRIG, CHOLHDL, LDLDIRECT,  in the last 72 hours No results found for this basename: TSH, T4TOTAL, FREET3, T3FREE, THYROIDAB,  in the last 72 hours No results found for this basename: VITAMINB12, FOLATE, FERRITIN, TIBC, IRON, RETICCTPCT,  in the last 72 hours No results found for this basename: LIPASE, AMYLASE,  in the last 72 hours  Urine Studies No results found for this basename: UACOL, UAPR, USPG, UPH, UTP, UGL, UKET, UBIL, UHGB, UNIT, UROB, ULEU, UEPI, UWBC, URBC, UBAC, CAST, CRYS, UCOM, BILUA,  in the last 72 hours  MICROBIOLOGY: Recent Results (from the past 240 hour(s))  CULTURE, BLOOD (ROUTINE X 2)     Status: None    Collection Time    10/07/13 12:39 PM      Result Value Ref Range Status   Specimen Description BLOOD RIGHT HAND   Final   Special Requests BOTTLES DRAWN AEROBIC AND ANAEROBIC 5CC   Final   Culture  Setup Time     Final   Value: 10/07/2013 16:50     Performed at [facility]   Culture     Final   Value: HAEMOPHILUS INFLUENZAE     Note: BETA LACTAMASE NEGATIVE Referred to [facility] in [place] Ser[place]. CRITICAL RESULT CALLED TO, READ BACK BY AND VERIFIED WITH: JENNIFER MAY @ 1004 10/10/13 BY KRAWS FAXED TO CONNIE WEANT AT [facility] 10/12/13      0926 BY PEAKY     Note: Gram Stain Report Called to,Read Back By and Verified With: Isidor Holts 10/08/13 @ 12:23PM BY RUSCOE A.     Performed at [facility]   Report Status PENDING   Incomplete  CULTURE, BLOOD (ROUTINE X 2)     Status: None   Collection Time    10/07/13 12:45 PM      Result Value Ref Range Status   Specimen Description BLOOD LEFT HAND   Final   Special Requests BOTTLES DRAWN AEROBIC ONLY 5CC   Final   Culture  Setup Time     Final   Value: 10/07/2013 16:51     Performed at [facility]   Culture     Final   Value: HAEMOPHILUS INFLUENZAE     Note: BETA LACTAMASE NEGATIVE [facility]. [facility] 10/12/13 0926 BY PEAKY     Note: Gram Stain Report Called to,Read Back By and Verified With: Isidor HoltsJOANNE HANZE 10/08/13 @ 12:23PM BY RUSCOE A.     [facility]   Report Status 10/12/2013 FINAL   Final  CULTURE, BLOOD (ROUTINE X 2)     Status: None   Collection Time    10/10/13  8:22 PM      Result Value Ref Range Status   Specimen Description BLOOD RIGHT FOREARM   Final   Special Requests BOTTLES DRAWN AEROBIC ONLY 8CC   Final   Culture  Setup Time     Final   Value: 10/11/2013 00:58     [facility]formed at [facility]   Culture     Final   Value:        BLOOD CULTURE RECEIVED NO GROWTH TO DATE CULTURE  WILL BE HELD FOR 5 DAYS BEFORE ISSUING A FINAL NEGATIVE REPORT     [facility]formed at [facility]   Report Status PENDING   Incomplete  CULTURE, BLOOD (ROUTINE X 2)     Status: None   Collection Time    10/10/13  8:25 PM      Result Value Ref Range Status   Specimen Description BLOOD LEFT FOREARM   Final   Special Requests BOTTLES DRAWN AEROBIC ONLY 10CC   Final   Culture  Setup Time     Final   Value: 10/11/2013 00:58     Performed at [facility]   Culture     Final   Value:        BLOOD CULTURE RECEIVED NO GROWTH TO DATE CULTURE WILL BE HELD FOR 5 DAYS BEFORE ISSUING A FINAL NEGATIVE REPORT     [facility]formed at [facility]   Report Status PENDING   Incomplete    RADIOLOGY STUDIES/RESULTS: Dg Chest 2 View  10/07/2013   CLINICAL DATA:  Cough.  Chest congestion.  Chest pain.  EXAM: CHEST  2 VIEW  COMPARISON:  06/09/2013  FINDINGS: Heart size is normal. The left lung shows chronic scarring. There is a infiltrate and volume loss in the right lower lobe consistent with bronchopneumonia. Right upper lobe remains clear. No effusions. No significant bony finding.  IMPRESSION: Background pattern of chronic pulmonary scarring. Bronchopneumonia within the right lower lobe with consolidation and volume loss.   Electronically Signed   By: Paulina FusiMark  Shogry M.D.   On: 10/07/2013 11:58   Dg Swallowing Func-speech Pathology  10/09/2013   Lacinda AxonKaren H Dankof, CCC-SLP     10/09/2013  8:28 PM Objective Swallowing Evaluation: Modified Barium Swallowing Study   Patient Details  Name: Katha HammingCallie B Baskins MRN: [id number] Date of Birth: 05/25/1923  Today's Date: 10/09/2013 Time: 1530-1600 SLP Time Calculation (min): 30 min  Past Medical History:  Past Medical History  Diagnosis Date  . Hypertension   . Arthritis   . Gout    Past Surgical History:  Past Surgical History  Procedure Laterality Date  . Appendectomy     HPI:  80 y/o female with PMH significant for HTN, arthritis, gout,  bronchitis, and repeat PNA.   Brought to ED with pleuritic chest  pain.  CXR: background pattern of chronic pulmonary scarring.   Bronchopneumonia within right lower lobe with  consolidation and  volume loss. Patient reports dysphagia as "getting strangled  during meals every day.  MBS indicated from BSE to assess risk  for aspiration due to repeated PNA.       Assessment / Plan / Recommendation Clinical Impression  Dysphagia Diagnosis: Mild oral phase dysphagia;Mild pharyngeal  phase dysphagia;Mild cervical esophageal phase dysphagia.  Oral phase marked by piecemeal swallows and delayed oral transit  with all liquid and solid consistencies.   Mild to moderate  pharyngeal phase dysphagia with esophageal involvement.   All  swallows triggered at valleculae with immediate backflow to  pyriforms s/p swallow of thin, nectar, and puree consistencies.   During second swallow, backflow,from pyriforms spill in airway  resulting in trace silent penetration during swallow with  eventual trace aspiration with thin liquid barium by spoon, cup,  and straw.  Cued throat clear and cough cleared majority of  penetrates and aspirated material.  Chin tuck not effective in  eliminating penetration/aspiration.  Minimal residuals in  vallecular space and pyriforms s/p swallow of thin, nectar, and  puree consistency. Strategies of multiple swallows effective in  clearing residuals.   Appears to be osteophytes at C3 and C6 no  radiologist present to confirm.  Prominent CP muscle noted.   Brief esophageal screen indicates slow bolus transit with  backflow to cervical esophagus noted.  Containment of whole  barium tablet noted at LES.  Recommend to continue current diet  consistency of dysphagia 3 and nectar thick liquids with full  supervision with all meals.  Recommend aspiration and reflux  precautions as risk remains s/p swallow due to noted backflow.   ST to continue in acute care setting for diet tolerance.   Recommend Skilled ST at next level of care.  Repeat MBS  with  clinical improvement prior to liquid upgrade as aspiration events  silent.  May benefit from GI consult to further assess esophageal  functioning.      Treatment Recommendation  Therapy as outlined in treatment plan below    Diet Recommendation Dysphagia 3 (Mechanical Soft);Nectar-thick  liquid   Liquid Administration via: Cup;No straw Medication Administration: Crushed with puree Supervision: Patient able to self feed;Full supervision/cueing  for compensatory strategies Compensations: Slow rate;Small sips/bites;Effortful  swallow;Multiple dry swallows after each bite/sip;Clear throat  intermittently Postural Changes and/or Swallow Maneuvers: Seated upright 90  degrees;Upright 30-60 min after meal    Other  Recommendations Recommended Consults: Consider GI  evaluation Oral Care Recommendations: Oral care Q4 per protocol Other Recommendations: Order thickener from pharmacy;Prohibited  food (jello, ice cream, thin soups);Remove water pitcher;Clarify  dietary restrictions   Follow Up Recommendations  Home health SLP    Frequency and Duration min 2x/week  2 weeks       SLP Swallow Goals     General Date of Onset: 10/07/13 HPI: 80 y/o female with PMH significant for HTN, arthritis, gout,  bronchitis, and repeat PNA.  Brought to ED with pleuritic chest  pain.  CXR: background pattern of chronic pulmonary scarring.   Bronchopneumonia within right lower lobe with consolidation and  volume loss. Patient reports dysphagia as "getting strangled  during meals every day.  MBS indicated from BSE to assess risk  for aspiration due to repeated PNA.   Type of Study: Modified Barium Swallowing Study Reason for Referral: Objectively evaluate swallowing function Previous Swallow Assessment: BSE 10/08/13 Diet Prior to this Study: Dysphagia 3 (soft);Nectar-thick liquids Temperature Spikes Noted: No Respiratory Status: Nasal cannula History of Recent Intubation: No Behavior/Cognition: Alert;Cooperative;Pleasant  mood Oral Cavity -  Dentition: Missing dentition Oral Motor / Sensory Function: Impaired - see Bedside swallow  eval Self-Feeding Abilities: Able to feed self Patient Positioning: Upright in chair Baseline Vocal Quality: Clear Volitional Cough: Strong Volitional Swallow: Able to elicit Pharyngeal Secretions: Not observed secondary MBS    Reason for Referral Objectively evaluate swallowing function   Oral Phase Oral Preparation/Oral Phase Oral Phase: Impaired Oral - Nectar Oral - Nectar Teaspoon: Reduced posterior propulsion;Decreased  velopharyngeal closure;Piecemeal swallowing;Delayed oral transit Oral - Nectar Cup: Piecemeal swallowing;Reduced posterior  propulsion;Decreased velopharyngeal closure;Delayed oral transit Oral - Thin Oral - Thin Teaspoon: Incomplete tongue to palate  contact;Piecemeal swallowing;Reduced posterior  propulsion;Decreased velopharyngeal closure;Delayed oral transit Oral - Solids Oral - Puree: Piecemeal swallowing;Incomplete tongue to palate  contact;Decreased velopharyngeal closure Oral - Mechanical Soft: Piecemeal swallowing;Decreased  velopharyngeal closure;Reduced posterior propulsion;Delayed oral  transit   Pharyngeal Phase Pharyngeal Phase Pharyngeal Phase: Impaired Pharyngeal - Nectar Pharyngeal - Nectar Teaspoon: Reduced pharyngeal  peristalsis;Reduced anterior laryngeal mobility;Reduced laryngeal  elevation;Premature spillage to valleculae;Reduced  airway/laryngeal closure;Reduced tongue base  retraction;Pharyngeal residue - pyriform sinuses;Pharyngeal  residue - valleculae;Pharyngeal residue - cp segment Pharyngeal - Nectar Cup: Reduced tongue base retraction;Reduced  pharyngeal peristalsis;Pharyngeal residue - cp segment;Reduced  anterior laryngeal mobility;Premature spillage to  valleculae;Reduced airway/laryngeal closure;Reduced laryngeal  elevation Pharyngeal - Thin Pharyngeal - Thin Teaspoon: Reduced pharyngeal  peristalsis;Reduced tongue base retraction;Penetration/Aspiration  during  swallow;Penetration/Aspiration after swallow;Reduced  anterior laryngeal mobility;Reduced laryngeal elevation;Premature  spillage to valleculae;Reduced airway/laryngeal closure;Trace  aspiration;Pharyngeal residue - pyriform sinuses Penetration/Aspiration details (thin teaspoon): Material enters  airway, remains ABOVE vocal cords and not ejected out;Material  enters airway, passes BELOW cords without attempt by patient to  eject out (silent aspiration) Pharyngeal - Thin Cup: Reduced pharyngeal peristalsis;Reduced  tongue base retraction;Penetration/Aspiration during  swallow;Reduced anterior laryngeal mobility;Premature spillage to  valleculae;Reduced laryngeal elevation;Reduced airway/laryngeal  closure Penetration/Aspiration details (thin cup): Material enters  airway, CONTACTS cords and not ejected out;Material enters  airway, remains ABOVE vocal cords and not ejected out;Material  enters airway, passes BELOW cords without attempt by patient to  eject out (silent aspiration) Pharyngeal - Thin Straw: Reduced tongue base retraction;Reduced  pharyngeal peristalsis;Reduced anterior laryngeal  mobility;Reduced laryngeal elevation;Premature spillage to  valleculae;Reduced airway/laryngeal closure;Trace  aspiration;Pharyngeal residue - cp segment;Penetration/Aspiration  during swallow Penetration/Aspiration details (thin straw): Material enters  airway, passes BELOW cords without attempt by patient to eject  out (silent aspiration);Material enters airway, CONTACTS cords  and not ejected out;Material enters airway, remains ABOVE vocal  cords and not ejected out Pharyngeal - Solids Pharyngeal - Puree: Reduced tongue base retraction;Reduced  pharyngeal peristalsis;Reduced anterior laryngeal  mobility;Reduced laryngeal elevation;Reduced airway/laryngeal  closure;Pharyngeal residue - valleculae;Pharyngeal residue - cp  segment Pharyngeal - Mechanical Soft: Reduced tongue base  retraction;Pharyngeal residue - cp  segment;Delayed swallow  initiation;Premature spillage to valleculae;Reduced  airway/laryngeal closure;Reduced laryngeal elevation;Pharyngeal  residue - valleculae  Cervical Esophageal Phase    GO    Cervical Esophageal Phase Cervical Esophageal Phase: Impaired Cervical Esophageal Phase - Solids Puree: Prominent cricopharyngeal segment;Esophageal backflow into  cervical esophagus Pill: Prominent cricopharyngeal segment;Other (Comment);Reduced  cricopharyngeal relaxation Cervical Esophageal Phase - Comment Cervical Esophageal Comment: contained at [facility] MS, CCC-SLP [phone] [facility] 10/09/2013, 8:05 PM     Jeoffrey Massed, MD  [facility] Pager:[phone]  If 7PM-7AM, please contact night-coverage [email or web address] Password [facility] 10/13/2013, 12:58 PM   LOS: 6 days

## 2013-10-13 NOTE — Clinical Social Work Psychosocial (Signed)
Clinical Social Work Department BRIEF PSYCHOSOCIAL ASSESSMENT 10/13/2013  Patient:  [name], [name]     Account Number:  [id number]     Admit date:  10/07/2013  Clinical Social Worker:  Lovey Newcomer  Date/Time:  10/13/2013 12:26 PM  Referred by:  Physician  Date Referred:  10/13/2013 Referred for  SNF Placement   Other Referral:   Interview type:  Patient Other interview type:   patient alert and oriented during assessment.    PSYCHOSOCIAL DATA Living Status:  ALONE Admitted from facility:   Level of care:   Primary support name:  Blinda Leatherwood Primary support relationship to patient:  SIBLING Degree of support available:   Support is good. Patient states that she has a very supportive son.    CURRENT CONCERNS Current Concerns  Post-Acute Placement   Other Concerns:    SOCIAL WORK ASSESSMENT / PLAN CSW met with patient at bedside to complete assessmnet. CSW explained SNF search and placement process to patient. Patient is agreeable to SNF placement and states that she has a preference for [facility] and [place]. Patient states that she lives at home alone but her sons checks in on her. Patient plans to return home after short term SNF stay. CSW explained that patient's insurance will have to authorize her SNF stay and that only certain facility contract with her insurance. Patient verbalized understanding.   Assessment/plan status:  Psychosocial Support/Ongoing Assessment of Needs Other assessment/ plan:   Complete FL2, Fax, PASRR   Information/referral to community resources:   CSW contact information and SNF list given to patient.    PATIENT'S/FAMILY'S RESPONSE TO PLAN OF CARE: Patient is agreeable to SNF placement in [facility]. Patient was pleasant, appropriate, and appreciative of CSW contact. Patient was smiling and overall appears to be happy. CSW will assist with DC.       Liz Beach, [place], [place], [phone]

## 2013-10-13 NOTE — Progress Notes (Signed)
PROCEDURE NOTE:  Procedure:  Transesophageal echocardiogram Operator:  Fransico Him, MD INdications: bacteremia Complications:  Unable to pass probe past back of her oropharynx IV Meds:  Versed 39m, Fentanyl 244m  Patient sedated but fought probe intubation.  After further sedation unable to intubate probe due to resistance met in back of oropharynx.  Patient was very uncomfortable with attempts despite sedation. Given her frail state and risk of esophageal perforation the TEE was aborted.   The patient was transported back to her room in stable condition.

## 2013-10-13 NOTE — Consult Note (Signed)
       Patient Name: [name] Date of Encounter: 10/13/2013    SUBJECTIVE: No cardiac complaints. Awake and alert. Patient is elderly.  TELEMETRY:  NSR: Filed Vitals:   10/12/13 0548 10/12/13 1334 10/12/13 2242 10/13/13 0510  BP: 130/66 109/64 130/71 149/71  Pulse: 90 78 75 89  Temp: 98.9 F (37.2 C) 98.3 F (36.8 C) 98 F (36.7 C) 98.2 F (36.8 C)  TempSrc: Oral Oral Oral Oral  Resp: 25 22 20 20  Height:      Weight:      SpO2: 97% 97% 98% 94%    Intake/Output Summary (Last 24 hours) at 10/13/13 0934 Last data filed at 10/13/13 0658  Gross per 24 hour  Intake      0 ml  Output    500 ml  Net   -500 ml    LABS: Basic Metabolic Panel: No results found for this basename: NA, K, CL, CO2, GLUCOSE, BUN, CREATININE, CALCIUM, MG, PHOS,  in the last 72 hours CBC:  Recent Labs  10/11/13 0253 10/12/13 0451  WBC 15.7* 10.9*  HGB 10.3* 10.2*  HCT 30.9* 31.0*  MCV 88.5 88.8  PLT 232 254   Cardiac Enzymes:  Recent Labs  10/10/13 1415 10/10/13 2022  TROPONINI <0.30 <0.30   ECHO: - Left ventricle: The cavity size was normal. Wall thickness was normal. Systolic function was normal. The estimated ejection fraction was in the range of 55% to 60%. Wall motion was normal; there were no regional wall motion abnormalities. Doppler parameters are consistent with abnormal left ventricular relaxation (grade 1 diastolic dysfunction). - Aortic valve: Valve mobility was restricted. There was mild stenosis. - Mitral valve: Calcified annulus. - Tricuspid valve: Moderate regurgitation. - Pulmonary arteries: Systolic pressure was mildly increased. PA peak pressure: 31mm Hg (S). Impressions:  - Possible catheter in right atrium; if patient does not have a catheter, consider cardiac MRI to R/O mass.  BLOOD CULTURE: Specimen Description BLOOD RIGHT HAND Special Requests BOTTLES DRAWN AEROBIC AND ANAEROBIC 5CC Culture Setup Time 10/07/2013 16:50 Performed at [facility] Culture HAEMOPHILUS INFLUENZAE   Radiology/Studies:  No new  Physical Exam: Blood pressure 149/71, pulse 89, temperature 98.2 F (36.8 C), temperature source Oral, resp. rate 20, height 5' 5" (1.651 m), weight 110 lb 7.2 oz (50.1 kg), SpO2 94.00%. Weight change:    No murmur or rub  ASSESSMENT:  1. H Flu bacteremia 2. Possible abnormality/mass right heart  Plan:  1. TEE requested by TH team.  Signed, Tanish Sinkler III,Kayslee Furey W 10/13/2013, 9:34 AM 

## 2013-10-13 NOTE — Clinical Social Work Placement (Signed)
Clinical Social Work Department CLINICAL SOCIAL WORK PLACEMENT NOTE 10/13/2013  Patient:  [name],[name]  Account Number:  [id number] Admit date:  10/07/2013  Clinical Social Worker:  Cherre BlancJOSEPH BRYANT [name], [place]  Date/time:  10/13/2013 04:23 PM  Clinical Social Work is seeking post-discharge placement for this patient at the following level of care:   SKILLED NURSING   (*CSW will update this form in Epic as items are completed)   10/13/2013  Patient/family provided with [facility] Department of Clinical Social Work's list of facilities offering this level of care within the geographic area requested by the patient (or if unable, by the patient's family).  10/13/2013  Patient/family informed of their freedom to choose among providers that offer the needed level of care, that participate in Medicare, Medicaid or managed care program needed by the patient, have an available bed and are willing to accept the patient.  10/13/2013  Patient/family informed of [facility]' ownership interest in [facility], as well as of the fact that they are under no obligation to receive care at this facility.  PASARR submitted to EDS on  PASARR number received from EDS on   FL2 transmitted to all facilities in geographic area requested by pt/family on  10/13/2013 FL2 transmitted to all facilities within larger geographic area on   Patient informed that his/her managed care company has contracts with or will negotiate with  certain facilities, including the following:     Patient/family informed of bed offers received:  10/13/2013 [facility] Physician recommends and patient chooses bed at    [facility]   Patient to be transferred to facility by   The following physician request were entered in Epic:   Additional Comments:   Roddie McBryant Charniece Venturino, [place], [place], [phone]

## 2013-10-13 NOTE — H&P (View-Only) (Signed)
       Patient Name: [name] Date of Encounter: 10/13/2013    SUBJECTIVE: No cardiac complaints. Awake and alert. Patient is elderly.  TELEMETRY:  NSR: Filed Vitals:   10/12/13 0548 10/12/13 1334 10/12/13 2242 10/13/13 0510  BP: 130/66 109/64 130/71 149/71  Pulse: 90 78 75 89  Temp: 98.9 F (37.2 C) 98.3 F (36.8 C) 98 F (36.7 C) 98.2 F (36.8 C)  TempSrc: Oral Oral Oral Oral  Resp: 25 22 20 20   Height:      Weight:      SpO2: 97% 97% 98% 94%    Intake/Output Summary (Last 24 hours) at 10/13/13 0934 Last data filed at 10/13/13 0658  Gross per 24 hour  Intake      0 ml  Output    500 ml  Net   -500 ml    LABS: Basic Metabolic Panel: No results found for this basename: NA, K, CL, CO2, GLUCOSE, BUN, CREATININE, CALCIUM, MG, PHOS,  in the last 72 hours CBC:  Recent Labs  10/11/13 0253 10/12/13 0451  WBC 15.7* 10.9*  HGB 10.3* 10.2*  HCT 30.9* 31.0*  MCV 88.5 88.8  PLT 232 254   Cardiac Enzymes:  Recent Labs  10/10/13 1415 10/10/13 2022  TROPONINI <0.30 <0.30   ECHO: - Left ventricle: The cavity size was normal. Wall thickness was normal. Systolic function was normal. The estimated ejection fraction was in the range of 55% to 60%. Wall motion was normal; there were no regional wall motion abnormalities. Doppler parameters are consistent with abnormal left ventricular relaxation (grade 1 diastolic dysfunction). - Aortic valve: Valve mobility was restricted. There was mild stenosis. - Mitral valve: Calcified annulus. - Tricuspid valve: Moderate regurgitation. - Pulmonary arteries: Systolic pressure was mildly increased. PA peak pressure: 31mm Hg (S). Impressions:  - Possible catheter in right atrium; if patient does not have a catheter, consider cardiac MRI to R/O mass.  BLOOD CULTURE: Specimen Description BLOOD RIGHT HAND Special Requests BOTTLES DRAWN AEROBIC AND ANAEROBIC 5CC Culture Setup Time 10/07/2013 16:50 [facility] Culture HAEMOPHILUS INFLUENZAE   Radiology/Studies:  No new  Physical Exam: Blood pressure 149/71, pulse 89, temperature 98.2 F (36.8 C), temperature source Oral, resp. rate 20, height 5\' 5"  (1.651 m), weight 110 lb 7.2 oz (50.1 kg), SpO2 94.00%. Weight change:    No murmur or rub  ASSESSMENT:  1. H Flu bacteremia 2. Possible abnormality/mass right heart  Plan[facility]  [facility]  Signed, Lesleigh NoeSMITH III,HENRY W 10/13/2013, 9:34 AM

## 2013-10-13 NOTE — OR Nursing (Signed)
Dr. Mayford Knifeurner unable to pass ultasound probe.  TEE aborted

## 2013-10-13 NOTE — Interval H&P Note (Signed)
History and Physical Interval Note:  10/13/2013 10:14 AM  [name]  has presented today for surgery, with the diagnosis of endocarditis  The various methods of treatment have been discussed with the patient and family. After consideration of risks, benefits and other options for treatment, the patient has consented to  Procedure(s): TRANSESOPHAGEAL ECHOCARDIOGRAM (TEE) (N/A) as a surgical intervention .  The patient's history has been reviewed, patient examined, no change in status, stable for surgery.  I have reviewed the patient's chart and labs.  Questions were answered to the patient's satisfaction.     Byanca Kasper R

## 2013-10-13 NOTE — Progress Notes (Signed)
Peripherally Inserted Central Catheter/Midline Placement  The IV Nurse has discussed with the patient and/or persons authorized to consent for the patient, the purpose of this procedure and the potential benefits and risks involved with this procedure.  The benefits include less needle sticks, lab draws from the catheter and patient may be discharged home with the catheter.  Risks include, but not limited to, infection, bleeding, blood clot (thrombus formation), and puncture of an artery; nerve damage and irregular heat beat.  Alternatives to this procedure were also discussed.  PICC/Midline Placement Documentation        [name], [name] 10/13/2013, 6:31 PM

## 2013-10-14 ENCOUNTER — Encounter (HOSPITAL_COMMUNITY): Payer: Self-pay | Admitting: Cardiology

## 2013-10-14 MED ORDER — HYDROCOD POLST-CHLORPHEN POLST 10-8 MG/5ML PO LQCR: 5.0000 mL | Freq: Two times a day (BID) | ORAL | Status: DC | PRN

## 2013-10-14 MED ORDER — DEXTROSE 5 % IV SOLN: 2.0000 g | INTRAVENOUS | Status: DC

## 2013-10-14 MED ORDER — OXYCODONE-ACETAMINOPHEN 5-325 MG PO TABS: 1.0000 | ORAL_TABLET | Freq: Four times a day (QID) | ORAL | Status: DC | PRN

## 2013-10-14 MED ORDER — BOOST / RESOURCE BREEZE PO LIQD: 1.0000 | Freq: Two times a day (BID) | ORAL | Status: DC

## 2013-10-14 NOTE — Progress Notes (Signed)
Physical Therapy Treatment Patient Details Name: [name] MRN: [id number] DOB: 07/29/1923 Today's Date: 10/14/2013 Time: [id number] PT Time Calculation (min): 13 min  PT Assessment / Plan / Recommendation  History of Present Illness Patient is an 80 yo female with medical history of hypertension arthritis and gout. She was recently treated for bronchitis, was brought to the emergency room pleuritic chest pain. She said that after she started antibiotics she was feeling better but then 3 days prior to admission she started developing pleuritic chest pain cough sneezing and felt very weak.  Patient admitted with CAP, COPD exacerbation, hypokalcemia.   PT Comments   *Performed BUE/LE exercises, attempted gait training but pt too worried about her power being cut off tonight. Left message for social work.**  Follow Up Recommendations  SNF;Supervision/Assistance - 24 hour     Does the patient have the potential to tolerate intense rehabilitation     Barriers to Discharge        Equipment Recommendations  None recommended by PT    Recommendations for Other Services    Frequency Min 3X/week   Progress towards PT Goals Progress towards PT goals: Not progressing toward goals - comment (pt worried about home situation)  Plan Current plan remains appropriate    Precautions / Restrictions Precautions Precautions: Fall Restrictions Weight Bearing Restrictions: No   Pertinent Vitals/Pain *Pt denied pain**    Mobility  Transfers Overall transfer level: Needs assistance Equipment used: Rolling walker (2 wheeled) Transfers: Sit to/from Stand Sit to Stand: Min assist General transfer comment: Verbal cues for hand placement.  Assist to rise to standing and for balance.  In standing, patient with decreased balance needing steadying assist.  Ambulation/Gait Ambulation/Gait assistance: Mod assist Ambulation Distance (Feet): 4 Feet Assistive device: Rolling walker (2 wheeled) Gait  Pattern/deviations: Decreased stride length;Step-to pattern Gait velocity interpretation: Below normal speed for age/gender General Gait Details: Pt took a couple steps, stated she couldn't walk further, and returned to recliner. She stated she's too stressed out about her power being cut off at home tonight. Left voice mail for social worker.     Exercises General Exercises - Lower Extremity Ankle Circles/Pumps: AROM;Both;Seated;10 reps Long Arc Quad: AROM;Both;Seated;10 reps Hip Flexion/Marching: AROM;Both;10 reps;Seated Shoulder Exercises Shoulder Flexion: AROM;Both;10 reps;Seated   PT Diagnosis:    PT Problem List:   PT Treatment Interventions:     PT Goals (current goals can now be found in the care plan section) Acute Rehab PT Goals Patient Stated Goal: To get better and go home PT Goal Formulation: With patient Time For Goal Achievement: 10/22/13 Potential to Achieve Goals: Good  Visit Information  Last PT Received On: 10/14/13 Assistance Needed: +1 Reason Eval/Treat Not Completed: Medical issues which prohibited therapy (Pt stated she's "too swimmy headed to walk". She's upset about power being cut off to her home. RN notified. ) History of Present Illness: Patient is an 80 yo female with medical history of hypertension arthritis and gout. She was recently treated for bronchitis, was brought to the emergency room pleuritic chest pain. She said that after she started antibiotics she was feeling better but then 3 days prior to admission she started developing pleuritic chest pain cough sneezing and felt very weak.  Patient admitted with CAP, COPD exacerbation, hypokalcemia.    Subjective Data  Patient Stated Goal: To get better and go home   Cognition  Cognition Arousal/Alertness: Awake/alert Behavior During Therapy: WFL for tasks assessed/performed Overall Cognitive Status: Within Functional Limits for tasks assessed  Balance  Balance Sitting balance-Leahy Scale:  Good Standing balance support: Single extremity supported Standing balance-Leahy Scale: Poor  End of Session PT - End of Session Equipment Utilized During Treatment: Gait belt;Oxygen Activity Tolerance: Other (comment) (stress regarding power being cut off at home) Patient left: in chair;with call bell/phone within reach Nurse Communication: Mobility status   GP     [name] 10/14/2013, 2:43 PM [phone]

## 2013-10-14 NOTE — Progress Notes (Signed)
10/14/13 Patient to go to SNF today. Going to Skilled facility with PICC for longterm IV ABT.

## 2013-10-14 NOTE — Progress Notes (Signed)
NUTRITION FOLLOW-UP  Patient meets criteria for severe malnutrition in the context of chronic illness as evidenced by severe muscle and subcutaneous fat loss.  DOCUMENTATION CODES Per approved criteria  -Severe malnutrition in the context of chronic illness -Underweight   INTERVENTION: Continue Resource Breeze po BID (thickened to appropriate consistency), each supplement provides 250 kcal and 9 grams of protein. Resume most recent SLP recommendations for liquid consistency - Nectar Thick - discussed with RN. RD to follow for nutrition care plan  NUTRITION DIAGNOSIS: Inadequate oral intake related to decreased appetite, acute illness as evidenced by PO intake 0-50%. Improving.  Goal: Pt to meet >/= 90% of their estimated nutrition needs   Monitor:  PO & supplemental intake, weight, labs, I/O's  ASSESSMENT: Patient with PMH of arthritis and gout recently treated for bronchitis, was brought to ER with pleuritic chest pain, cough, fever and weakness. Work-up reveals bronchopneumonia.  SLP saw pt on 2/25 with recommendations for Dysphagia 3 with Nectar-Thickened Liquids. Pt with thin liquids ordered, however most recent SLP recommendation on 2/25 is for Nectar-Thickened liquids. Discussed with RN. Eating 25-60% of meals. Pt reports that her appetite is good. She notes that she doesn't eat a lot at baseline and she is eating as much as she can. Ordered for [employer] Breeze presently.  Pt with R atrial mass. Underwent TEE 2/26, however pt was too uncomfortable with attempts despite sedation and TEE was cancelled.  Height: Ht Readings from Last 1 Encounters:  10/11/13 5\' 5"  (1.651 m)    Weight: Wt Readings from Last 1 Encounters:  10/11/13 110 lb 7.2 oz (50.1 kg)  Admit wt 104 lb  BMI:  Body mass index is 18.38 kg/(m^2). Underweight  Estimated Nutritional Needs: Kcal: 1400-1600 Protein: 60-70 gm Fluid: >/= 1.5 L  Skin: swelling and bruising to R bicept  Diet Order: Dysphagia  3 -- thin  EDUCATION NEEDS: -No education needs identified at this time   Intake/Output Summary (Last 24 hours) at 10/14/13 1017 Last data filed at 10/14/13 0926  Gross per 24 hour  Intake    480 ml  Output    980 ml  Net   -500 ml    Last BM: 2/26   Labs:   Recent Labs Lab 10/08/13 0457 10/09/13 0318 10/10/13 0311  NA 139 139 142  K 4.1 4.6 4.6  CL 103 108 110  CO2 23 20 22   BUN 36* 34* 21  CREATININE 1.17* 0.93 0.82  CALCIUM 9.1 9.0 9.0  GLUCOSE 110* 131* 125*    Scheduled Meds: . allopurinol  150 mg Oral QHS  . atenolol  12.5 mg Oral Daily  . cefTRIAXone (ROCEPHIN)  IV  2 g Intravenous Q24H  . feeding supplement (RESOURCE BREEZE)  1 Container Oral BID BM  . heparin  5,000 Units Subcutaneous 3 times per day  . simvastatin  40 mg Oral QHS  . sodium chloride  3 mL Intravenous Q12H    Continuous Infusions: . sodium chloride     Lindsay MottoSamantha Haston Casebolt MS, RD, LDN Inpatient Registered Dietitian Pager: [phone] After-hours pager: [phone]

## 2013-10-14 NOTE — Progress Notes (Addendum)
PT Cancellation Note  Patient Details Name: [name] MRN: [id number] DOB: 02/18/1923   Cancelled Treatment:    Reason Eval/Treat Not Completed: Medical issues which prohibited therapy (Pt stated she's "too swimmy headed to walk". She's upset about power being cut off to her home. RN notified. Left voice mail for social worker. )   [name], [name] 10/14/2013, 1:46 PM [phone]

## 2013-10-14 NOTE — Care Management Note (Signed)
    Page 1 of 1   10/17/2013     12:34:11 PM   CARE MANAGEMENT NOTE 10/17/2013  Patient:  [name],[name]   Account Number:  [id number]  Date Initiated:  10/14/2013  Documentation initiated by:  Letha CapeAYLOR,Dandy Lazaro  Subjective/Objective Assessment:   dx cap, haemophilus influenzae  admit     Action/Plan:   Anticipated DC Date:  10/17/2013   Anticipated DC Plan:  SKILLED NURSING FACILITY  In-house referral  Clinical Social Worker      DC Planning Services  CM consult      Choice offered to / List presented to:             Status of service:  Completed, signed off Medicare Important Message given?   (If response is "NO", the following Medicare IM given date fields will be blank) Date Medicare IM given:   Date Additional Medicare IM given:    Discharge Disposition:  SKILLED NURSING FACILITY  Per UR Regulation:  Reviewed for med. necessity/level of care/duration of stay  If discussed at Long Length of Stay Meetings, dates discussed:   10/13/2013    Comments:  10/17/13 1233 Letha Capeeborah Fabienne Nolasco RN, BSN [phone] patient is for dc to snf, CSW following.  10/13/13 1029 Letha Capeeborah Ivah Girardot RN, BSN [phone] patient has ? thrombus in r atrium with tricuspid regurgitation, transthoracic echo shows no vegetation, will need TEE as well.  plan is for SNF. CSW following.

## 2013-10-14 NOTE — Clinical Social Work Note (Signed)
CSW notified by RN that patient is worried about her "lights being turned off at home." CSW met with patient at bedside. Patient was clearly upset. Patient stated that she did not realize she had been in the hospital this long and she has been unable to pay her power bill. Patient states that she was supposed to meet with [employer] today to get assistance with paying for her power bill but cannot since she is in the hospital. CSW brainstormed with patient to determine a way to prevent her power from being disconnected. Patient does not have any family members or friends that can help in this situation. Patient stated that she has three grandchildren that live with her son at her house and she didn't want them to be cold. CSW offered to contact [employer] to see if anything could be done. CSW spoke with [employer] customer service rep and explained patient's situation. Representative explained that patient's power has been scheduled for disconnection and this would likely happen today by 5:00PM. CSW inquired about the possibility of working with the patient given her current situation. Representative stated that she could delay disconnection until Monday, but the patient will need to pay $200 on that day. Patient was agreeable to this and stated that she will have the funds to do this as her "check comes in tomorrow". Patient was thankful for CSW's assistance.   Liz Beach, [place], [place], [phone]

## 2013-10-14 NOTE — Discharge Summary (Addendum)
PATIENT DETAILS Name: [name] Age: 80 y.o. Sex: female Date of Birth: [birth year] MRN: [id number]. Admit Date: 10/07/2013 Admitting Physician: Marinda Elk, MD PCP:KIM, Fayrene Fearing, MD  Recommendations for Outpatient Follow-up:  1. Please check CBC and chemistries weekly while on IV Rocephin 2. Please followup blood cultures done on 2/23 till final. 3. Rocephin for 28 days from 10/10/13  PRIMARY DISCHARGE DIAGNOSIS:  Active Problems:   CAP (community acquired pneumonia)   Community acquired pneumonia   Acute respiratory failure   Hypokalemia   COPD with exacerbation   Protein-calorie malnutrition, severe   Haemophilus bacteremia      PAST MEDICAL HISTORY: Past Medical History  Diagnosis Date  . Hypertension   . Arthritis   . Gout   . H/O hiatal hernia   . GERD (gastroesophageal reflux disease)   . Bronchitis     DISCHARGE MEDICATIONS:   Medication List    STOP taking these medications       amLODipine 10 MG tablet  Commonly known as:  NORVASC     hydrALAZINE 10 MG tablet  Commonly known as:  APRESOLINE     losartan 100 MG tablet  Commonly known as:  COZAAR      TAKE these medications       albuterol 108 (90 BASE) MCG/ACT inhaler  Commonly known as:  PROVENTIL HFA;VENTOLIN HFA  Inhale 2 puffs into the lungs every 6 (six) hours as needed for wheezing or shortness of breath.     allopurinol 300 MG tablet  Commonly known as:  ZYLOPRIM  Take 450 mg by mouth at bedtime.     atenolol 25 MG tablet  Commonly known as:  TENORMIN  Take 25 mg by mouth 2 (two) times daily.     chlorpheniramine-HYDROcodone 10-8 MG/5ML Lqcr  Commonly known as:  TUSSIONEX  Take 5 mLs by mouth every 12 (twelve) hours as needed for cough.     dextrose 5 % SOLN 50 mL with cefTRIAXone 2 G SOLR 2 g  Inject 2 g into the vein daily. For 28 days from 10/10/13     feeding supplement (RESOURCE BREEZE) Liqd  Take 1 Container by mouth 2 (two) times daily between meals.     oxyCODONE-acetaminophen 5-325 MG per tablet  Commonly known as:  PERCOCET/ROXICET  Take 1 tablet by mouth every 6 (six) hours as needed (pain).     polyethylene glycol packet  Commonly known as:  MIRALAX / GLYCOLAX  Take 17 g by mouth 2 (two) times daily as needed (constipation).     simvastatin 40 MG tablet  Commonly known as:  ZOCOR  Take 40 mg by mouth at bedtime.        ALLERGIES:   Allergies  Allergen Reactions  . Penicillins Rash    BRIEF HPI:  See H&P, Labs, Consult and Test reports for all details in brief, patient is a 80 year old female with a past medical history of hypertension, gouty arthritis who was admitted for pleuritic chest pain. In the emergency room, she was found to have a WBC of 13, a chest x-ray that showed a right lower lobe consolidation and hence admitted to the hospitalist service for further evaluation and treatment.  CONSULTATIONS:   cardiology and ID  PERTINENT RADIOLOGIC STUDIES: Dg Chest 2 View  10/07/2013   CLINICAL DATA:  Cough.  Chest congestion.  Chest pain.  EXAM: CHEST  2 VIEW  COMPARISON:  06/09/2013  FINDINGS: Heart size is normal. The left lung shows chronic scarring.  There is a infiltrate and volume loss in the right lower lobe consistent with bronchopneumonia. Right upper lobe remains clear. No effusions. No significant bony finding.  IMPRESSION: Background pattern of chronic pulmonary scarring. Bronchopneumonia within the right lower lobe with consolidation and volume loss.   Electronically Signed   By: Paulina Fusi M.D.   On: 10/07/2013 11:58   Dg Swallowing Func-speech Pathology  10/09/2013   Lacinda Axon, CCC-SLP     10/09/2013  8:28 PM Objective Swallowing Evaluation: Modified Barium Swallowing Study   Patient Details  Name: [name] MRN: [id number] Date of Birth: 11-04-1922  Today's Date: 10/09/2013 Time: 1530-1600 SLP Time Calculation (min): 30 min  Past Medical History:  Past Medical History  Diagnosis Date  . Hypertension   .  Arthritis   . Gout    Past Surgical History:  Past Surgical History  Procedure Laterality Date  . Appendectomy     HPI:  80 y/o female with PMH significant for HTN, arthritis, gout,  bronchitis, and repeat PNA.  Brought to ED with pleuritic chest  pain.  CXR: background pattern of chronic pulmonary scarring.   Bronchopneumonia within right lower lobe with consolidation and  volume loss. Patient reports dysphagia as "getting strangled  during meals every day.  MBS indicated from BSE to assess risk  for aspiration due to repeated PNA.       Assessment / Plan / Recommendation Clinical Impression  Dysphagia Diagnosis: Mild oral phase dysphagia;Mild pharyngeal  phase dysphagia;Mild cervical esophageal phase dysphagia.  Oral phase marked by piecemeal swallows and delayed oral transit  with all liquid and solid consistencies.   Mild to moderate  pharyngeal phase dysphagia with esophageal involvement.   All  swallows triggered at valleculae with immediate backflow to  pyriforms s/p swallow of thin, nectar, and puree consistencies.   During second swallow, backflow,from pyriforms spill in airway  resulting in trace silent penetration during swallow with  eventual trace aspiration with thin liquid barium by spoon, cup,  and straw.  Cued throat clear and cough cleared majority of  penetrates and aspirated material.  Chin tuck not effective in  eliminating penetration/aspiration.  Minimal residuals in  vallecular space and pyriforms s/p swallow of thin, nectar, and  puree consistency. Strategies of multiple swallows effective in  clearing residuals.   Appears to be osteophytes at C3 and C6 no  radiologist present to confirm.  Prominent CP muscle noted.   Brief esophageal screen indicates slow bolus transit with  backflow to cervical esophagus noted.  Containment of whole  barium tablet noted at LES.  Recommend to continue current diet  consistency of dysphagia 3 and nectar thick liquids with full  supervision with all meals.   Recommend aspiration and reflux  precautions as risk remains s/p swallow due to noted backflow.   ST to continue in acute care setting for diet tolerance.   Recommend Skilled ST at next level of care.  Repeat MBS with  clinical improvement prior to liquid upgrade as aspiration events  silent.  May benefit from GI consult to further assess esophageal  functioning.      Treatment Recommendation  Therapy as outlined in treatment plan below    Diet Recommendation Dysphagia 3 (Mechanical Soft);Nectar-thick  liquid   Liquid Administration via: Cup;No straw Medication Administration: Crushed with puree Supervision: Patient able to self feed;Full supervision/cueing  for compensatory strategies Compensations: Slow rate;Small sips/bites;Effortful  swallow;Multiple dry swallows after each bite/sip;Clear throat  intermittently Postural Changes and/or Swallow  Maneuvers: Seated upright 90  degrees;Upright 30-60 min after meal    Other  Recommendations Recommended Consults: Consider GI  evaluation Oral Care Recommendations: Oral care Q4 per protocol Other Recommendations: Order thickener from pharmacy;Prohibited  food (jello, ice cream, thin soups);Remove water pitcher;Clarify  dietary restrictions   Follow Up Recommendations  Home health SLP    Frequency and Duration min 2x/week  2 weeks       SLP Swallow Goals     General Date of Onset: 10/07/13 HPI: 80 y/o female with PMH significant for HTN, arthritis, gout,  bronchitis, and repeat PNA.  Brought to ED with pleuritic chest  pain.  CXR: background pattern of chronic pulmonary scarring.   Bronchopneumonia within right lower lobe with consolidation and  volume loss. Patient reports dysphagia as "getting strangled  during meals every day.  MBS indicated from BSE to assess risk  for aspiration due to repeated PNA.   Type of Study: Modified Barium Swallowing Study Reason for Referral: Objectively evaluate swallowing function Previous Swallow Assessment: BSE 10/08/13 Diet Prior to this  Study: Dysphagia 3 (soft);Nectar-thick liquids Temperature Spikes Noted: No Respiratory Status: Nasal cannula History of Recent Intubation: No Behavior/Cognition: Alert;Cooperative;Pleasant mood Oral Cavity - Dentition: Missing dentition Oral Motor / Sensory Function: Impaired - see Bedside swallow  eval Self-Feeding Abilities: Able to feed self Patient Positioning: Upright in chair Baseline Vocal Quality: Clear Volitional Cough: Strong Volitional Swallow: Able to elicit Pharyngeal Secretions: Not observed secondary MBS    Reason for Referral Objectively evaluate swallowing function   Oral Phase Oral Preparation/Oral Phase Oral Phase: Impaired Oral - Nectar Oral - Nectar Teaspoon: Reduced posterior propulsion;Decreased  velopharyngeal closure;Piecemeal swallowing;Delayed oral transit Oral - Nectar Cup: Piecemeal swallowing;Reduced posterior  propulsion;Decreased velopharyngeal closure;Delayed oral transit Oral - Thin Oral - Thin Teaspoon: Incomplete tongue to palate  contact;Piecemeal swallowing;Reduced posterior  propulsion;Decreased velopharyngeal closure;Delayed oral transit Oral - Solids Oral - Puree: Piecemeal swallowing;Incomplete tongue to palate  contact;Decreased velopharyngeal closure Oral - Mechanical Soft: Piecemeal swallowing;Decreased  velopharyngeal closure;Reduced posterior propulsion;Delayed oral  transit   Pharyngeal Phase Pharyngeal Phase Pharyngeal Phase: Impaired Pharyngeal - Nectar Pharyngeal - Nectar Teaspoon: Reduced pharyngeal  peristalsis;Reduced anterior laryngeal mobility;Reduced laryngeal  elevation;Premature spillage to valleculae;Reduced  airway/laryngeal closure;Reduced tongue base  retraction;Pharyngeal residue - pyriform sinuses;Pharyngeal  residue - valleculae;Pharyngeal residue - cp segment Pharyngeal - Nectar Cup: Reduced tongue base retraction;Reduced  pharyngeal peristalsis;Pharyngeal residue - cp segment;Reduced  anterior laryngeal mobility;Premature spillage to   valleculae;Reduced airway/laryngeal closure;Reduced laryngeal  elevation Pharyngeal - Thin Pharyngeal - Thin Teaspoon: Reduced pharyngeal  peristalsis;Reduced tongue base retraction;Penetration/Aspiration  during swallow;Penetration/Aspiration after swallow;Reduced  anterior laryngeal mobility;Reduced laryngeal elevation;Premature  spillage to valleculae;Reduced airway/laryngeal closure;Trace  aspiration;Pharyngeal residue - pyriform sinuses Penetration/Aspiration details (thin teaspoon): Material enters  airway, remains ABOVE vocal cords and not ejected out;Material  enters airway, passes BELOW cords without attempt by patient to  eject out (silent aspiration) Pharyngeal - Thin Cup: Reduced pharyngeal peristalsis;Reduced  tongue base retraction;Penetration/Aspiration during  swallow;Reduced anterior laryngeal mobility;Premature spillage to  valleculae;Reduced laryngeal elevation;Reduced airway/laryngeal  closure Penetration/Aspiration details (thin cup): Material enters  airway, CONTACTS cords and not ejected out;Material enters  airway, remains ABOVE vocal cords and not ejected out;Material  enters airway, passes BELOW cords without attempt by patient to  eject out (silent aspiration) Pharyngeal - Thin Straw: Reduced tongue base retraction;Reduced  pharyngeal peristalsis;Reduced anterior laryngeal  mobility;Reduced laryngeal elevation;Premature spillage to  valleculae;Reduced airway/laryngeal closure;Trace  aspiration;Pharyngeal residue - cp segment;Penetration/Aspiration  during swallow Penetration/Aspiration details (thin straw):  Material enters  airway, passes BELOW cords without attempt by patient to eject  out (silent aspiration);Material enters airway, CONTACTS cords  and not ejected out;Material enters airway, remains ABOVE vocal  cords and not ejected out Pharyngeal - Solids Pharyngeal - Puree: Reduced tongue base retraction;Reduced  pharyngeal peristalsis;Reduced anterior laryngeal  mobility;Reduced  laryngeal elevation;Reduced airway/laryngeal  closure;Pharyngeal residue - valleculae;Pharyngeal residue - cp  segment Pharyngeal - Mechanical Soft: Reduced tongue base  retraction;Pharyngeal residue - cp segment;Delayed swallow  initiation;Premature spillage to valleculae;Reduced  airway/laryngeal closure;Reduced laryngeal elevation;Pharyngeal  residue - valleculae  Cervical Esophageal Phase    GO    Cervical Esophageal Phase Cervical Esophageal Phase: Impaired Cervical Esophageal Phase - Solids Puree: Prominent cricopharyngeal segment;Esophageal backflow into  cervical esophagus Pill: Prominent cricopharyngeal segment;Other (Comment);Reduced  cricopharyngeal relaxation Cervical Esophageal Phase - Comment Cervical Esophageal Comment: contained at LES         Moreen FowlerKaren Dankof MS, CCC-SLP [phone] [id number] [facility] CenterDANKOF,KAREN 10/09/2013, 8:05 PM      PERTINENT LAB RESULTS: CBC:  Recent Labs  10/12/13 0451  WBC 10.9*  HGB 10.2*  HCT 31.0*  PLT 254   CMET CMP     Component Value Date/Time   NA 142 10/10/2013 0311   K 4.6 10/10/2013 0311   CL 110 10/10/2013 0311   CO2 22 10/10/2013 0311   GLUCOSE 125* 10/10/2013 0311   BUN 21 10/10/2013 0311   CREATININE 0.82 10/10/2013 0311   CALCIUM 9.0 10/10/2013 0311   PROT 5.7* 10/08/2013 0457   ALBUMIN 2.4* 10/08/2013 0457   AST 25 10/08/2013 0457   ALT 14 10/08/2013 0457   ALKPHOS 63 10/08/2013 0457   BILITOT 0.6 10/08/2013 0457   GFRNONAA 62* 10/10/2013 0311   GFRAA 72* 10/10/2013 0311    GFR Estimated Creatinine Clearance: 37.5 ml/min (by C-G formula based on Cr of 0.82). No results found for this basename: LIPASE, AMYLASE,  in the last 72 hours No results found for this basename: CKTOTAL, CKMB, CKMBINDEX, TROPONINI,  in the last 72 hours No components found with this basename: POCBNP,  No results found for this basename: DDIMER,  in the last 72 hours No results found for this basename: HGBA1C,  in the last 72 hours No results found for this basename: CHOL, HDL, LDLCALC,  TRIG, CHOLHDL, LDLDIRECT,  in the last 72 hours No results found for this basename: TSH, T4TOTAL, FREET3, T3FREE, THYROIDAB,  in the last 72 hours No results found for this basename: VITAMINB12, FOLATE, FERRITIN, TIBC, IRON, RETICCTPCT,  in the last 72 hours Coags: No results found for this basename: PT, INR,  in the last 72 hours Microbiology: Recent Results (from the past 240 hour(s))  CULTURE, BLOOD (ROUTINE X 2)     Status: None   Collection Time    10/07/13 12:39 PM      Result Value Ref Range Status   Specimen Description BLOOD RIGHT HAND   Final   Special Requests BOTTLES DRAWN AEROBIC AND ANAEROBIC 5CC   Final   Culture  Setup Time     Final   Value: 10/07/2013 16:50     Performed at [facility]   Culture     Final   Value: HAEMOPHILUS INFLUENZAE     Note: BETA LACTAMASE NEGATIVE [facility] [place] Ser[place]. CRITICAL RESULT CALLED TO, READ BACK BY AND VERIFIED WITH: JENNIFER MAY @ 1004 10/10/13 BY KRAWS [facility] 10/12/13      0926 BY  PEAKY     Note: Gram Stain Report Called to,Read Back By and Verified With: Isidor Holts 10/08/13 @ 12:23PM BY RUSCOE A.     Performed at [facility]   Report Status PENDING   Incomplete  CULTURE, BLOOD (ROUTINE X 2)     Status: None   Collection Time    10/07/13 12:45 PM      Result Value Ref Range Status   Specimen Description BLOOD LEFT HAND   Final   Special Requests BOTTLES DRAWN AEROBIC ONLY 5CC   Final   Culture  Setup Time     Final   Value: 10/07/2013 16:51     Performed at [facility]   Culture     Final   Value: HAEMOPHILUS INFLUENZAE     Note: BETA LACTAMASE NEGATIVE Previous Specimen Sent to [facility] for Typing. FAXED TO CONNIE WEANT AT [facility] 10/12/13 0926 BY PEAKY     Note: Gram Stain Report Called to,Read Back By and Verified With: Isidor Holts 10/08/13 @ 12:23PM BY RUSCOE A.     Performed at [facility]   Report Status  10/12/2013 FINAL   Final  CULTURE, BLOOD (ROUTINE X 2)     Status: None   Collection Time    10/10/13  8:22 PM      Result Value Ref Range Status   Specimen Description BLOOD RIGHT FOREARM   Final   Special Requests BOTTLES DRAWN AEROBIC ONLY 8CC   Final   Culture  Setup Time     Final   Value: 10/11/2013 00:58     Performed at [facility]   Culture     Final   Value:        BLOOD CULTURE RECEIVED NO GROWTH TO DATE CULTURE WILL BE HELD FOR 5 DAYS BEFORE ISSUING A FINAL NEGATIVE REPORT     Performed at [facility]   Report Status PENDING   Incomplete  CULTURE, BLOOD (ROUTINE X 2)     Status: None   Collection Time    10/10/13  8:25 PM      Result Value Ref Range Status   Specimen Description BLOOD LEFT FOREARM   Final   Special Requests BOTTLES DRAWN AEROBIC ONLY 10CC   Final   Culture  Setup Time     Final   Value: 10/11/2013 00:58     Performed at [facility]   Culture     Final   Value:        BLOOD CULTURE RECEIVED NO GROWTH TO DATE CULTURE WILL BE HELD FOR 5 DAYS BEFORE ISSUING A FINAL NEGATIVE REPORT     Performed at [facility]   Report Status PENDING   Incomplete     BRIEF HOSPITAL COURSE:  CAP (community acquired pneumonia)  - Patient was admitted and started on vancomycin, cefepime and Levaquin initially. She clinically improved, antibiotics were then transitioned to just  Rocephin. Blood cultures are positive for Haemophilus influenzae. - Initially plan was for a TEE to be done, if negative to plan for at least 2 weeks of IV antibiotics, however TEE was attempted on 2/26 and was unsuccessful, for now we have decided to empirically treat her for 4 weeks from 2/23 with Rocephin. Please note, this plan of care was discussed with both infectious disease (Dr. Drue Second) and cardiology (Dr Katrinka Blazing)  Haemophilus influenza bacteremia  - Likely secondary to above.  - 2-D echocardiogram showing? Thrombus in the right atrium with  tricuspid  regurgitation. Discussed with infectious disease (Dr Drue Second), this organism is not typically associated with endocarditis. TEE attempted on 2/26, unsuccessful. Plans were initially to treat for 2 weeks if TEE was negative, and for 4 weeks if TEE was positive. Since TEE not able to be done, will just empirically treat for 4 weeks. Please do CBC and chemistries weekly while on Rocephin. Infectious disease will arrange for followup at the ID clinic.  ? Thrombus in the right atrium  - Cardiology note on 2/25, apparently does structures seen in the right atrium is likely to be a prominent remnant of the eustachian valve. Apparently this was seen on her prior echocardiogram on 01/22/2010.  - Spoke with Dr. Katrinka Blazing- cardiology on 2/27, he does not recommend any further workup at this time including a cardiac MRI.  History of chronic bronchitis/COPD  - Shortness of breath likely secondary to pneumonia, not wheezing. Continue with inhaled bronchodilators and oxygen.  - Taper off oxygen as tolerated.   Hypertension  - Blood pressure controlled with just atenolol   Dyslipidemia  - Continue statins   History of gout  - Stable with allopurinol   Protein-calorie malnutrition, severe  - Resource breeze twice a day  TODAY-DAY OF DISCHARGE:  Subjective:   [name] today has no headache,no chest abdominal pain,no new weakness tingling or numbness  Objective:   Blood pressure 128/78, pulse 73, temperature 97.4 F (36.3 C), temperature source Oral, resp. rate 20, height 5\' 5"  (1.651 m), weight 50.1 kg (110 lb 7.2 oz), SpO2 97.00%.  Intake/Output Summary (Last 24 hours) at 10/14/13 1046 Last data filed at 10/14/13 0926  Gross per 24 hour  Intake    480 ml  Output    800 ml  Net   -320 ml   Filed Weights   10/10/13 0335 10/11/13 0500 10/11/13 2100  Weight: 51.5 kg (113 lb 8.6 oz) 51.7 kg (113 lb 15.7 oz) 50.1 kg (110 lb 7.2 oz)    Exam Awake Alert, Oriented *3, No new F.N deficits, Normal  affect [place].AT,PERRAL Supple Neck,No JVD, No cervical lymphadenopathy appriciated.  Symmetrical Chest wall movement, Good air movement bilaterally, right sided rales at base RRR,No Gallops,Rubs or new Murmurs, No Parasternal Heave +ve B.Sounds, Abd Soft, Non tender, No organomegaly appriciated, No rebound -guarding or rigidity. No Cyanosis, Clubbing or edema, No new Rash or bruise  DISCHARGE CONDITION: Stable  DISPOSITION: SNF  DISCHARGE INSTRUCTIONS:    Activity:  As tolerated with Full fall precautions use walker/cane & assistance as needed  Diet recommendation: Heart Healthy diet      Discharge Orders   Future Orders Complete By Expires   Call MD for:  temperature >100.4  As directed    Diet - low sodium heart healthy  As directed    Increase activity slowly  As directed       Follow-up Information   Follow up with Pearson Grippe, MD. Schedule an appointment as soon as possible for a visit in 2 weeks. (After discharge from skilled nursing facility)    Specialty:  Internal Medicine   Contact information:   [address] [place] [address] [phone]       Follow up with Judyann Munson, MD. (Clinical arrange a followup appointment)    Specialty:  Infectious Diseases   Contact information:   [address] [place] [address] [phone]       Total Time spent on discharge equals 45 minutes.  SignedJeoffrey Massed 10/14/2013  10:46 AM  Addendum - 10/17/13-please note, patient hospital course continues to be stable. Please note that the above discharge summary is current to the ongoing medical issues with the patient as stated above.  S Ghimire

## 2013-10-15 NOTE — Progress Notes (Signed)
PATIENT DETAILS Name: [name] Age: 80 y.o. Sex: female Date of Birth: 1923-02-25 Admit Date: 10/07/2013 Admitting Physician Marinda Elk, MD PCP:KIM, Fayrene Fearing, MD  Subjective: No major issues overnight, awaiting SNF bed  Assessment/Plan: Active Problems:   CAP (community acquired pneumonia) - Patient was admitted and started on vancomycin, cefepime and Levaquin initially. However now has been tapered to Rocephin. Blood cultures are positive for Haemophilus influenzae. Will plan for at least 2 weeks of IV antibiotics with Rocephin from 10/11/13 if TEE negative, however since TEE unable to be done, will empirically treat for 4 weeks.  Haemophilus influenza bacteremia - Likely secondary to above. - 2-D echocardiogram showing? Thrombus in the right atrium with tricuspid regurgitation. Discussed with infectious disease (Dr Drue Second), this organism is not typically associated with endocarditis. TEE attempted on 2/26, unsuccessful. Plans were initially to treat for 2 weeks if TEE was negative, and for 4 weeks if TEE was positive. Since TEE not able to be done, will just empirically treat for 4 weeks.  ? Thrombus in the right atrium - Cardiology note on 2/25, apparently does structures seen in the right atrium is likely to be a prominent remnant of the eustachian valve. Apparently this was seen on her prior echocardiogram on 01/22/2010. - Spoke with Dr. Sherilyn Cooter Smith-cardiology, no further workup needed.  History of chronic bronchitis/COPD - Shortness of breath likely secondary to pneumonia, not wheezing. Continue with inhaled bronchodilators and oxygen. - Taper off oxygen as tolerated.  Hypertension - Blood pressure controlled with just atenolol  Dyslipidemia - Continue statins  History of gout - Stable with allopurinol   Protein-calorie malnutrition, severe - Resource breeze twice a day  Disposition: Remain inpatient  DVT Prophylaxis: Prophylactic Heparin  Code  Status: Full code   Family Communication None at bedside  Procedures:  None  CONSULTS:  cardiology   MEDICATIONS: Scheduled Meds: . allopurinol  150 mg Oral QHS  . atenolol  12.5 mg Oral Daily  . cefTRIAXone (ROCEPHIN)  IV  2 g Intravenous Q24H  . feeding supplement (RESOURCE BREEZE)  1 Container Oral BID BM  . heparin  5,000 Units Subcutaneous 3 times per day  . simvastatin  40 mg Oral QHS  . sodium chloride  3 mL Intravenous Q12H   Continuous Infusions: . sodium chloride     PRN Meds:.acetaminophen, acetaminophen, albuterol, food thickener, ondansetron (ZOFRAN) IV, ondansetron, oxyCODONE-acetaminophen, polyethylene glycol, sodium chloride, sodium chloride, sodium chloride  Antibiotics: Anti-infectives   Start     Dose/Rate Route Frequency Ordered Stop   10/14/13 0000  dextrose 5 % SOLN 50 mL with cefTRIAXone 2 G SOLR 2 g     2 g 100 mL/hr over 30 Minutes Intravenous Every 24 hours 10/14/13 1046     10/13/13 1000  cefTRIAXone (ROCEPHIN) 2 g in dextrose 5 % 50 mL IVPB     2 g 100 mL/hr over 30 Minutes Intravenous Every 24 hours 10/12/13 1437     10/11/13 1000  cefTRIAXone (ROCEPHIN) 1 g in dextrose 5 % 50 mL IVPB  Status:  Discontinued     1 g 100 mL/hr over 30 Minutes Intravenous Every 24 hours 10/10/13 1509 10/12/13 1437   10/09/13 1300  levofloxacin (LEVAQUIN) IVPB 750 mg  Status:  Discontinued     750 mg 100 mL/hr over 90 Minutes Intravenous Every 48 hours 10/09/13 1218 10/10/13 1508   10/08/13 1630  vancomycin (VANCOCIN) 500 mg in sodium chloride 0.9 % 100 mL  IVPB  Status:  Discontinued     500 mg 100 mL/hr over 60 Minutes Intravenous Every 24 hours 10/08/13 1624 10/09/13 1211   10/08/13 1300  vancomycin (VANCOCIN) 500 mg in sodium chloride 0.9 % 100 mL IVPB  Status:  Discontinued     500 mg 100 mL/hr over 60 Minutes Intravenous Every 24 hours 10/07/13 1242 10/07/13 1637   10/08/13 1200  ceFEPIme (MAXIPIME) 1 g in dextrose 5 % 50 mL IVPB  Status:  Discontinued      1 g 100 mL/hr over 30 Minutes Intravenous Every 24 hours 10/08/13 1049 10/10/13 1508   10/07/13 1800  cefTRIAXone (ROCEPHIN) 1 g in dextrose 5 % 50 mL IVPB  Status:  Discontinued     1 g 100 mL/hr over 30 Minutes Intravenous Every 24 hours 10/07/13 1723 10/08/13 1000   10/07/13 1800  azithromycin (ZITHROMAX) tablet 500 mg  Status:  Discontinued     500 mg Oral Every 24 hours 10/07/13 1723 10/08/13 1000   10/07/13 1245  vancomycin (VANCOCIN) IVPB 1000 mg/200 mL premix     1,000 mg 200 mL/hr over 60 Minutes Intravenous  Once 10/07/13 1242 10/07/13 1419   10/07/13 1215  ceFEPIme (MAXIPIME) 1 g in dextrose 5 % 50 mL IVPB     1 g 100 mL/hr over 30 Minutes Intravenous  Once 10/07/13 1211 10/07/13 1537       PHYSICAL EXAM: Vital signs in last 24 hours: Filed Vitals:   10/14/13 0455 10/14/13 1357 10/14/13 2030 10/15/13 0416  BP: 128/78 106/63 127/72 161/76  Pulse: 73 76 66 70  Temp: 97.4 F (36.3 C) 97.8 F (36.6 C) 98.4 F (36.9 C) 98.1 F (36.7 C)  TempSrc: Oral Oral Oral Oral  Resp: 20 20 20 20   Height:      Weight:      SpO2: 97% 96% 98% 95%    Weight change:  Filed Weights   10/10/13 0335 10/11/13 0500 10/11/13 2100  Weight: 51.5 kg (113 lb 8.6 oz) 51.7 kg (113 lb 15.7 oz) 50.1 kg (110 lb 7.2 oz)   Body mass index is 18.38 kg/(m^2).   Gen Exam: Awake and alert with clear speech.   Neck: Supple, No JVD.   Chest: Good air entry bilaterally, still has rales on the right lung base. CVS: S1 S2 Regular, no murmurs.  Abdomen: soft, BS +, non tender, non distended.  Extremities: no edema, lower extremities warm to touch. Neurologic: Non Focal.   Skin: No Rash.   Wounds: N/A.    Intake/Output from previous day:  Intake/Output Summary (Last 24 hours) at 10/15/13 1256 Last data filed at 10/15/13 0648  Gross per 24 hour  Intake    118 ml  Output    325 ml  Net   -207 ml     LAB RESULTS: CBC  Recent Labs Lab 10/09/13 0318 10/10/13 0311 10/11/13 0253  10/12/13 0451  WBC 21.8* 20.0* 15.7* 10.9*  HGB 11.3* 10.2* 10.3* 10.2*  HCT 33.7* 31.1* 30.9* 31.0*  PLT 281 260 232 254  MCV 88.9 89.1 88.5 88.8  MCH 29.8 29.2 29.5 29.2  MCHC 33.5 32.8 33.3 32.9  RDW 14.2 14.3 14.3 14.4    Chemistries   Recent Labs Lab 10/09/13 0318 10/10/13 0311  NA 139 142  K 4.6 4.6  CL 108 110  CO2 20 22  GLUCOSE 131* 125*  BUN 34* 21  CREATININE 0.93 0.82  CALCIUM 9.0 9.0    CBG: No results found for  this basename: GLUCAP,  in the last 168 hours  GFR Estimated Creatinine Clearance: 37.5 ml/min (by C-G formula based on Cr of 0.82).  Coagulation profile No results found for this basename: INR, PROTIME,  in the last 168 hours  Cardiac Enzymes  Recent Labs Lab 10/10/13 0825 10/10/13 1415 10/10/13 2022  TROPONINI <0.30 <0.30 <0.30    No components found with this basename: POCBNP,  No results found for this basename: DDIMER,  in the last 72 hours No results found for this basename: HGBA1C,  in the last 72 hours No results found for this basename: CHOL, HDL, LDLCALC, TRIG, CHOLHDL, LDLDIRECT,  in the last 72 hours No results found for this basename: TSH, T4TOTAL, FREET3, T3FREE, THYROIDAB,  in the last 72 hours No results found for this basename: VITAMINB12, FOLATE, FERRITIN, TIBC, IRON, RETICCTPCT,  in the last 72 hours No results found for this basename: LIPASE, AMYLASE,  in the last 72 hours  Urine Studies No results found for this basename: UACOL, UAPR, USPG, UPH, UTP, UGL, UKET, UBIL, UHGB, UNIT, UROB, ULEU, UEPI, UWBC, URBC, UBAC, CAST, CRYS, UCOM, BILUA,  in the last 72 hours  MICROBIOLOGY: Recent Results (from the past 240 hour(s))  CULTURE, BLOOD (ROUTINE X 2)     Status: None   Collection Time    10/07/13 12:39 PM      Result Value Ref Range Status   Specimen Description BLOOD RIGHT HAND   Final   Special Requests BOTTLES DRAWN AEROBIC AND ANAEROBIC 5CC   Final   Culture  Setup Time     Final   Value: 10/07/2013 16:50      Performed at [facility]   Culture     Final   Value: HAEMOPHILUS INFLUENZAE     Note: BETA LACTAMASE NEGATIVE [facility] [place] for Ser[place]. CRITICAL RESULT CALLED TO, READ BACK BY AND VERIFIED WITH: JENNIFER MAY @ 1004 10/10/13 BY KRAWS [facility] 10/12/13      0926 BY PEAKY     Note: Gram Stain Report Called to,Read Back By and Verified With: Isidor HoltsJOANNE HANZE 10/08/13 @ 12:23PM BY RUSCOE A.     Performed at [facility]   Report Status PENDING   Incomplete  CULTURE, BLOOD (ROUTINE X 2)     Status: None   Collection Time    10/07/13 12:45 PM      Result Value Ref Range Status   Specimen Description BLOOD LEFT HAND   Final   Special Requests BOTTLES DRAWN AEROBIC ONLY 5CC   Final   Culture  Setup Time     Final   Value: 10/07/2013 16:51     Performed at [facility]   Culture     Final   Value: HAEMOPHILUS INFLUENZAE     Note: BETA LACTAMASE NEGATIVE Previous Specimen [facility] for Typ[facility]. FA[facility] 10/12/13 0926 BY PEAKY     Note: Gram Stain Report Called to,Read Back By and Verified With: Isidor HoltsJOANNE HANZE 10/08/13 @ 12:23PM BY RUSCOE A.     Performed at [facility]   Report Status 10/12/2013 FINAL   Final  CULTURE, BLOOD (ROUTINE X 2)     Status: None   Collection Time    10/10/13  8:22 PM      Result Value Ref Range Status   Specimen Description BLOOD RIGHT FOREARM   Final   Special Requests BOTTLES  DRAWN AEROBIC ONLY 8CC   Final   Culture  Setup Time     Final   Value: 10/11/2013 00:58     Performed at [facility]   Culture     Final   Value:        BLOOD CULTURE RECEIVED NO GROWTH TO DATE CULTURE WILL BE HELD FOR 5 DAYS BEFORE ISSUING A FINAL NEGATIVE REPORT     Performed at [facility]   Report Status PENDING   Incomplete  CULTURE, BLOOD (ROUTINE X 2)     Status: None   Collection Time    10/10/13  8:25 PM      Result Value Ref  Range Status   Specimen Description BLOOD LEFT FOREARM   Final   Special Requests BOTTLES DRAWN AEROBIC ONLY 10CC   Final   Culture  Setup Time     Final   Value: 10/11/2013 00:58     Performed at [facility]   Culture     Final   Value:        BLOOD CULTURE RECEIVED NO GROWTH TO DATE CULTURE WILL BE HELD FOR 5 DAYS BEFORE ISSUING A FINAL NEGATIVE REPORT     Performed at [facility]   Report Status PENDING   Incomplete    RADIOLOGY STUDIES/RESULTS: Dg Chest 2 View  10/07/2013   CLINICAL DATA:  Cough.  Chest congestion.  Chest pain.  EXAM: CHEST  2 VIEW  COMPARISON:  06/09/2013  FINDINGS: Heart size is normal. The left lung shows chronic scarring. There is a infiltrate and volume loss in the right lower lobe consistent with bronchopneumonia. Right upper lobe remains clear. No effusions. No significant bony finding.  IMPRESSION: Background pattern of chronic pulmonary scarring. Bronchopneumonia within the right lower lobe with consolidation and volume loss.   Electronically Signed   By: Paulina Fusi M.D.   On: 10/07/2013 11:58   Dg Swallowing Func-speech Pathology  10/09/2013   Lacinda Axon, CCC-SLP     10/09/2013  8:28 PM Objective Swallowing Evaluation: Modified Barium Swallowing Study   Patient Details  Name: [name] MRN: [id number] Date of Birth: [birth year]  Today's Date: 10/09/2013 Time: 1530-1600 SLP Time Calculation (min): 30 min  Past Medical History:  Past Medical History  Diagnosis Date  . Hypertension   . Arthritis   . Gout    Past Surgical History:  Past Surgical History  Procedure Laterality Date  . Appendectomy     HPI:  80 y/o female with PMH significant for HTN, arthritis, gout,  bronchitis, and repeat PNA.  Brought to ED with pleuritic chest  pain.  CXR: background pattern of chronic pulmonary scarring.   Bronchopneumonia within right lower lobe with consolidation and  volume loss. Patient reports dysphagia as "getting strangled  during meals every day.  MBS  indicated from BSE to assess risk  for aspiration due to repeated PNA.       Assessment / Plan / Recommendation Clinical Impression  Dysphagia Diagnosis: Mild oral phase dysphagia;Mild pharyngeal  phase dysphagia;Mild cervical esophageal phase dysphagia.  Oral phase marked by piecemeal swallows and delayed oral transit  with all liquid and solid consistencies.   Mild to moderate  pharyngeal phase dysphagia with esophageal involvement.   All  swallows triggered at valleculae with immediate backflow to  pyriforms s/p swallow of thin, nectar, and puree consistencies.   During second swallow, backflow,from pyriforms spill in airway  resulting in trace silent penetration during swallow  with  eventual trace aspiration with thin liquid barium by spoon, cup,  and straw.  Cued throat clear and cough cleared majority of  penetrates and aspirated material.  Chin tuck not effective in  eliminating penetration/aspiration.  Minimal residuals in  vallecular space and pyriforms s/p swallow of thin, nectar, and  puree consistency. Strategies of multiple swallows effective in  clearing residuals.   Appears to be osteophytes at C3 and C6 no  radiologist present to confirm.  Prominent CP muscle noted.   Brief esophageal screen indicates slow bolus transit with  backflow to cervical esophagus noted.  Containment of whole  barium tablet noted at LES.  Recommend to continue current diet  consistency of dysphagia 3 and nectar thick liquids with full  supervision with all meals.  Recommend aspiration and reflux  precautions as risk remains s/p swallow due to noted backflow.   ST to continue in acute care setting for diet tolerance.   Recommend Skilled ST at next level of care.  Repeat MBS with  clinical improvement prior to liquid upgrade as aspiration events  silent.  May benefit from GI consult to further assess esophageal  functioning.      Treatment Recommendation  Therapy as outlined in treatment plan below    Diet Recommendation  Dysphagia 3 (Mechanical Soft);Nectar-thick  liquid   Liquid Administration via: Cup;No straw Medication Administration: Crushed with puree Supervision: Patient able to self feed;Full supervision/cueing  for compensatory strategies Compensations: Slow rate;Small sips/bites;Effortful  swallow;Multiple dry swallows after each bite/sip;Clear throat  intermittently Postural Changes and/or Swallow Maneuvers: Seated upright 90  degrees;Upright 30-60 min after meal    Other  Recommendations Recommended Consults: Consider GI  evaluation Oral Care Recommendations: Oral care Q4 per protocol Other Recommendations: Order thickener from pharmacy;Prohibited  food (jello, ice cream, thin soups);Remove water pitcher;Clarify  dietary restrictions   Follow Up Recommendations  Home health SLP    Frequency and Duration min 2x/week  2 weeks       SLP Swallow Goals     General Date of Onset: 10/07/13 HPI: 80 y/o female with PMH significant for HTN, arthritis, gout,  bronchitis, and repeat PNA.  Brought to ED with pleuritic chest  pain.  CXR: background pattern of chronic pulmonary scarring.   Bronchopneumonia within right lower lobe with consolidation and  volume loss. Patient reports dysphagia as "getting strangled  during meals every day.  MBS indicated from BSE to assess risk  for aspiration due to repeated PNA.   Type of Study: Modified Barium Swallowing Study Reason for Referral: Objectively evaluate swallowing function Previous Swallow Assessment: BSE 10/08/13 Diet Prior to this Study: Dysphagia 3 (soft);Nectar-thick liquids Temperature Spikes Noted: No Respiratory Status: Nasal cannula History of Recent Intubation: No Behavior/Cognition: Alert;Cooperative;Pleasant mood Oral Cavity - Dentition: Missing dentition Oral Motor / Sensory Function: Impaired - see Bedside swallow  eval Self-Feeding Abilities: Able to feed self Patient Positioning: Upright in chair Baseline Vocal Quality: Clear Volitional Cough: Strong Volitional Swallow:  Able to elicit Pharyngeal Secretions: Not observed secondary MBS    Reason for Referral Objectively evaluate swallowing function   Oral Phase Oral Preparation/Oral Phase Oral Phase: Impaired Oral - Nectar Oral - Nectar Teaspoon: Reduced posterior propulsion;Decreased  velopharyngeal closure;Piecemeal swallowing;Delayed oral transit Oral - Nectar Cup: Piecemeal swallowing;Reduced posterior  propulsion;Decreased velopharyngeal closure;Delayed oral transit Oral - Thin Oral - Thin Teaspoon: Incomplete tongue to palate  contact;Piecemeal swallowing;Reduced posterior  propulsion;Decreased velopharyngeal closure;Delayed oral transit Oral - Solids Oral - Puree: Piecemeal swallowing;Incomplete tongue to  palate  contact;Decreased velopharyngeal closure Oral - Mechanical Soft: Piecemeal swallowing;Decreased  velopharyngeal closure;Reduced posterior propulsion;Delayed oral  transit   Pharyngeal Phase Pharyngeal Phase Pharyngeal Phase: Impaired Pharyngeal - Nectar Pharyngeal - Nectar Teaspoon: Reduced pharyngeal  peristalsis;Reduced anterior laryngeal mobility;Reduced laryngeal  elevation;Premature spillage to valleculae;Reduced  airway/laryngeal closure;Reduced tongue base  retraction;Pharyngeal residue - pyriform sinuses;Pharyngeal  residue - valleculae;Pharyngeal residue - cp segment Pharyngeal - Nectar Cup: Reduced tongue base retraction;Reduced  pharyngeal peristalsis;Pharyngeal residue - cp segment;Reduced  anterior laryngeal mobility;Premature spillage to  valleculae;Reduced airway/laryngeal closure;Reduced laryngeal  elevation Pharyngeal - Thin Pharyngeal - Thin Teaspoon: Reduced pharyngeal  peristalsis;Reduced tongue base retraction;Penetration/Aspiration  during swallow;Penetration/Aspiration after swallow;Reduced  anterior laryngeal mobility;Reduced laryngeal elevation;Premature  spillage to valleculae;Reduced airway/laryngeal closure;Trace  aspiration;Pharyngeal residue - pyriform sinuses Penetration/Aspiration  details (thin teaspoon): Material enters  airway, remains ABOVE vocal cords and not ejected out;Material  enters airway, passes BELOW cords without attempt by patient to  eject out (silent aspiration) Pharyngeal - Thin Cup: Reduced pharyngeal peristalsis;Reduced  tongue base retraction;Penetration/Aspiration during  swallow;Reduced anterior laryngeal mobility;Premature spillage to  valleculae;Reduced laryngeal elevation;Reduced airway/laryngeal  closure Penetration/Aspiration details (thin cup): Material enters  airway, CONTACTS cords and not ejected out;Material enters  airway, remains ABOVE vocal cords and not ejected out;Material  enters airway, passes BELOW cords without attempt by patient to  eject out (silent aspiration) Pharyngeal - Thin Straw: Reduced tongue base retraction;Reduced  pharyngeal peristalsis;Reduced anterior laryngeal  mobility;Reduced laryngeal elevation;Premature spillage to  valleculae;Reduced airway/laryngeal closure;Trace  aspiration;Pharyngeal residue - cp segment;Penetration/Aspiration  during swallow Penetration/Aspiration details (thin straw): Material enters  airway, passes BELOW cords without attempt by patient to eject  out (silent aspiration);Material enters airway, CONTACTS cords  and not ejected out;Material enters airway, remains ABOVE vocal  cords and not ejected out Pharyngeal - Solids Pharyngeal - Puree: Reduced tongue base retraction;Reduced  pharyngeal peristalsis;Reduced anterior laryngeal  mobility;Reduced laryngeal elevation;Reduced airway/laryngeal  closure;Pharyngeal residue - valleculae;Pharyngeal residue - cp  segment Pharyngeal - Mechanical Soft: Reduced tongue base  retraction;Pharyngeal residue - cp segment;Delayed swallow  initiation;Premature spillage to valleculae;Reduced  airway/laryngeal closure;Reduced laryngeal elevation;Pharyngeal  residue - valleculae  Cervical Esophageal Phase    GO    Cervical Esophageal Phase Cervical Esophageal Phase: Impaired  Cervical Esophageal Phase - Solids Puree: Prominent cricopharyngeal segment;Esophageal backflow into  cervical esophagus Pill: Prominent cricopharyngeal segment;Other (Comment);Reduced  cricopharyngeal relaxation Cervical Esophageal Phase - Comment Cervical Esophageal Comment: contained at [facility] MS, CCC-SLP [phone] [facility] 10/09/2013, 8:05 PM     Jeoffrey Massed, MD  [facility] Pager:[phone]  If 7PM-7AM, please contact night-coverage [email or web address] Password [facility] 10/15/2013, 12:56 PM   LOS: 8 days

## 2013-10-16 NOTE — Progress Notes (Signed)
PATIENT DETAILS Name: [name] Age: 80 y.o. Sex: female Date of Birth: July 29, 1923 Admit Date: 10/07/2013 Admitting Physician Marinda Elk, MD PCP:KIM, Fayrene Fearing, MD  Subjective: Claims to have slept well-no major complaints, awaiting SNF bed  Assessment/Plan: Active Problems:   CAP (community acquired pneumonia) - Patient was admitted and started on vancomycin, cefepime and Levaquin initially. However now has been tapered to Rocephin. Blood cultures are positive for Haemophilus influenzae. Will plan for at least 2 weeks of IV antibiotics with Rocephin from 10/11/13 if TEE negative, however since TEE unable to be done, will empirically treat for 4 weeks.  Haemophilus influenza bacteremia - Likely secondary to above. - 2-D echocardiogram showing? Thrombus in the right atrium with tricuspid regurgitation. Discussed with infectious disease (Dr Drue Second), this organism is not typically associated with endocarditis. TEE attempted on 2/26, unsuccessful. Plans were initially to treat for 2 weeks if TEE was negative, and for 4 weeks if TEE was positive. Since TEE not able to be done, will just empirically treat for 4 weeks.  ? Thrombus in the right atrium - Cardiology note on 2/25, apparently does structures seen in the right atrium is likely to be a prominent remnant of the eustachian valve. Apparently this was seen on her prior echocardiogram on 01/22/2010. - Spoke with Dr. Sherilyn Cooter Smith-cardiology, no further workup needed.  History of chronic bronchitis/COPD - Shortness of breath likely secondary to pneumonia, not wheezing. Continue with inhaled bronchodilators and oxygen. - Taper off oxygen as tolerated.  Hypertension - Blood pressure controlled with just atenolol  Dyslipidemia - Continue statins  History of gout - Stable with allopurinol   Protein-calorie malnutrition, severe - Resource breeze twice a day  Disposition: Remain inpatient  DVT  Prophylaxis: Prophylactic Heparin  Code Status: Full code   Family Communication None at bedside  Procedures:  None  CONSULTS:  cardiology   MEDICATIONS: Scheduled Meds: . allopurinol  150 mg Oral QHS  . atenolol  12.5 mg Oral Daily  . cefTRIAXone (ROCEPHIN)  IV  2 g Intravenous Q24H  . feeding supplement (RESOURCE BREEZE)  1 Container Oral BID BM  . heparin  5,000 Units Subcutaneous 3 times per day  . simvastatin  40 mg Oral QHS  . sodium chloride  3 mL Intravenous Q12H   Continuous Infusions: . sodium chloride 20 mL/hr at 10/15/13 2155   PRN Meds:.acetaminophen, acetaminophen, albuterol, food thickener, ondansetron (ZOFRAN) IV, ondansetron, oxyCODONE-acetaminophen, polyethylene glycol, sodium chloride, sodium chloride, sodium chloride  Antibiotics: Anti-infectives   Start     Dose/Rate Route Frequency Ordered Stop   10/14/13 0000  dextrose 5 % SOLN 50 mL with cefTRIAXone 2 G SOLR 2 g     2 g 100 mL/hr over 30 Minutes Intravenous Every 24 hours 10/14/13 1046     10/13/13 1000  cefTRIAXone (ROCEPHIN) 2 g in dextrose 5 % 50 mL IVPB     2 g 100 mL/hr over 30 Minutes Intravenous Every 24 hours 10/12/13 1437     10/11/13 1000  cefTRIAXone (ROCEPHIN) 1 g in dextrose 5 % 50 mL IVPB  Status:  Discontinued     1 g 100 mL/hr over 30 Minutes Intravenous Every 24 hours 10/10/13 1509 10/12/13 1437   10/09/13 1300  levofloxacin (LEVAQUIN) IVPB 750 mg  Status:  Discontinued     750 mg 100 mL/hr over 90 Minutes Intravenous Every 48 hours 10/09/13 1218 10/10/13 1508   10/08/13 1630  vancomycin (VANCOCIN) 500 mg in  sodium chloride 0.9 % 100 mL IVPB  Status:  Discontinued     500 mg 100 mL/hr over 60 Minutes Intravenous Every 24 hours 10/08/13 1624 10/09/13 1211   10/08/13 1300  vancomycin (VANCOCIN) 500 mg in sodium chloride 0.9 % 100 mL IVPB  Status:  Discontinued     500 mg 100 mL/hr over 60 Minutes Intravenous Every 24 hours 10/07/13 1242 10/07/13 1637   10/08/13 1200  ceFEPIme  (MAXIPIME) 1 g in dextrose 5 % 50 mL IVPB  Status:  Discontinued     1 g 100 mL/hr over 30 Minutes Intravenous Every 24 hours 10/08/13 1049 10/10/13 1508   10/07/13 1800  cefTRIAXone (ROCEPHIN) 1 g in dextrose 5 % 50 mL IVPB  Status:  Discontinued     1 g 100 mL/hr over 30 Minutes Intravenous Every 24 hours 10/07/13 1723 10/08/13 1000   10/07/13 1800  azithromycin (ZITHROMAX) tablet 500 mg  Status:  Discontinued     500 mg Oral Every 24 hours 10/07/13 1723 10/08/13 1000   10/07/13 1245  vancomycin (VANCOCIN) IVPB 1000 mg/200 mL premix     1,000 mg 200 mL/hr over 60 Minutes Intravenous  Once 10/07/13 1242 10/07/13 1419   10/07/13 1215  ceFEPIme (MAXIPIME) 1 g in dextrose 5 % 50 mL IVPB     1 g 100 mL/hr over 30 Minutes Intravenous  Once 10/07/13 1211 10/07/13 1537       PHYSICAL EXAM: Vital signs in last 24 hours: Filed Vitals:   10/15/13 0416 10/15/13 1334 10/15/13 2219 10/16/13 0522  BP: 161/76 146/75 136/71 160/79  Pulse: 70 77 70 71  Temp: 98.1 F (36.7 C) 98.3 F (36.8 C) 98.3 F (36.8 C) 98.3 F (36.8 C)  TempSrc: Oral Oral Oral Oral  Resp: 20 22 22 22   Height:      Weight:      SpO2: 95% 92% 95% 97%    Weight change:  Filed Weights   10/10/13 0335 10/11/13 0500 10/11/13 2100  Weight: 51.5 kg (113 lb 8.6 oz) 51.7 kg (113 lb 15.7 oz) 50.1 kg (110 lb 7.2 oz)   Body mass index is 18.38 kg/(m^2).   Gen Exam: Awake and alert with clear speech.   Neck: Supple, No JVD.   Chest: Good air entry bilaterally, still has rales on the right lung base.No rhonchi CVS: S1 S2 Regular, no murmurs.  Abdomen: soft, BS +, non tender, non distended.  Extremities: no edema, lower extremities warm to touch. Neurologic: Non Focal-but with gen. weakness Skin: No Rash.   Wounds: N/A.    Intake/Output from previous day:  Intake/Output Summary (Last 24 hours) at 10/16/13 1103 Last data filed at 10/16/13 0920  Gross per 24 hour  Intake    420 ml  Output    120 ml  Net    300 ml      LAB RESULTS: CBC  Recent Labs Lab 10/10/13 0311 10/11/13 0253 10/12/13 0451  WBC 20.0* 15.7* 10.9*  HGB 10.2* 10.3* 10.2*  HCT 31.1* 30.9* 31.0*  PLT 260 232 254  MCV 89.1 88.5 88.8  MCH 29.2 29.5 29.2  MCHC 32.8 33.3 32.9  RDW 14.3 14.3 14.4    Chemistries   Recent Labs Lab 10/10/13 0311  NA 142  K 4.6  CL 110  CO2 22  GLUCOSE 125*  BUN 21  CREATININE 0.82  CALCIUM 9.0    CBG: No results found for this basename: GLUCAP,  in the last 168 hours  GFR  Estimated Creatinine Clearance: 37.5 ml/min (by C-G formula based on Cr of 0.82).  Coagulation profile No results found for this basename: INR, PROTIME,  in the last 168 hours  Cardiac Enzymes  Recent Labs Lab 10/10/13 0825 10/10/13 1415 10/10/13 2022  TROPONINI <0.30 <0.30 <0.30    No components found with this basename: POCBNP,  No results found for this basename: DDIMER,  in the last 72 hours No results found for this basename: HGBA1C,  in the last 72 hours No results found for this basename: CHOL, HDL, LDLCALC, TRIG, CHOLHDL, LDLDIRECT,  in the last 72 hours No results found for this basename: TSH, T4TOTAL, FREET3, T3FREE, THYROIDAB,  in the last 72 hours No results found for this basename: VITAMINB12, FOLATE, FERRITIN, TIBC, IRON, RETICCTPCT,  in the last 72 hours No results found for this basename: LIPASE, AMYLASE,  in the last 72 hours  Urine Studies No results found for this basename: UACOL, UAPR, USPG, UPH, UTP, UGL, UKET, UBIL, UHGB, UNIT, UROB, ULEU, UEPI, UWBC, URBC, UBAC, CAST, CRYS, UCOM, BILUA,  in the last 72 hours  MICROBIOLOGY: Recent Results (from the past 240 hour(s))  CULTURE, BLOOD (ROUTINE X 2)     Status: None   Collection Time    10/07/13 12:39 PM      Result Value Ref Range Status   Specimen Description BLOOD RIGHT HAND   Final   Special Requests BOTTLES DRAWN AEROBIC AND ANAEROBIC 5CC   Final   Culture  Setup Time     Final   Value: 10/07/2013 16:50     Performed at  [facility]   Culture     Final   Value: HAEMOPHILUS INFLUENZAE     Note: BETA LACTAMASE NEGATIVE Referred to [facility] in [place]. CRITICAL RESULT CALLED TO, READ BACK BY AND VERIFIED WITH: JENNIFER MAY @ 1004 10/10/13 BY KRAWS FAXED TO CONNIE WEANT AT [facility] 10/12/13      0926 BY PEAKY     Note: Gram Stain Report Called to,Read Back By and Verified With: Isidor Holts 10/08/13 @ 12:23PM BY RUSCOE A.     Performed at [facility]   Report Status PENDING   Incomplete  CULTURE, BLOOD (ROUTINE X 2)     Status: None   Collection Time    10/07/13 12:45 PM      Result Value Ref Range Status   Specimen Description BLOOD LEFT HAND   Final   Special Requests BOTTLES DRAWN AEROBIC ONLY 5CC   Final   Culture  Setup Time     Final   Value: 10/07/2013 16:51     Performed at [facility]   Culture     Final   Value: HAEMOPHILUS INFLUENZAE     Note: BETA LACTAMASE NEGATIVE Previous Specimen Sent to [facility] for Typing. FAXED TO CONNIE WEANT AT [facility] 10/12/13 0926 BY PEAKY     Note: Gram Stain Report Called to,Read Back By and Verified With: Isidor Holts 10/08/13 @ 12:23PM BY RUSCOE A.     Performed at [facility]   Report Status 10/12/2013 FINAL   Final  CULTURE, BLOOD (ROUTINE X 2)     Status: None   Collection Time    10/10/13  8:22 PM      Result Value Ref Range Status   Specimen Description BLOOD RIGHT FOREARM   Final   Special Requests BOTTLES DRAWN AEROBIC ONLY 8CC   Final   Culture  Setup Time     Final   Value: 10/11/2013 00:58     Performed at [facility]   Culture     Final   Value:        BLOOD CULTURE RECEIVED NO GROWTH TO DATE CULTURE WILL BE HELD FOR 5 DAYS BEFORE ISSUING A FINAL NEGATIVE REPORT     Performed at [facility]   Report Status PENDING   Incomplete  CULTURE, BLOOD (ROUTINE X 2)     Status: None   Collection Time    10/10/13  8:25 PM      Result Value Ref Range Status    Specimen Description BLOOD LEFT FOREARM   Final   Special Requests BOTTLES DRAWN AEROBIC ONLY 10CC   Final   Culture  Setup Time     Final   Value: 10/11/2013 00:58     Performed at [facility]   Culture     Final   Value:        BLOOD CULTURE RECEIVED NO GROWTH TO DATE CULTURE WILL BE HELD FOR 5 DAYS BEFORE ISSUING A FINAL NEGATIVE REPORT     Performed at [facility]   Report Status PENDING   Incomplete    RADIOLOGY STUDIES/RESULTS: Dg Chest 2 View  10/07/2013   CLINICAL DATA:  Cough.  Chest congestion.  Chest pain.  EXAM: CHEST  2 VIEW  COMPARISON:  06/09/2013  FINDINGS: Heart size is normal. The left lung shows chronic scarring. There is a infiltrate and volume loss in the right lower lobe consistent with bronchopneumonia. Right upper lobe remains clear. No effusions. No significant bony finding.  IMPRESSION: Background pattern of chronic pulmonary scarring. Bronchopneumonia within the right lower lobe with consolidation and volume loss.   Electronically Signed   By: Paulina Fusi M.D.   On: 10/07/2013 11:58   Dg Swallowing Func-speech Pathology  10/09/2013   Lacinda Axon, CCC-SLP     10/09/2013  8:28 PM Objective Swallowing Evaluation: Modified Barium Swallowing Study   Patient Details  Name: [name] MRN: [id number] Date of Birth: 02-Oct-1922  Today's Date: 10/09/2013 Time: 1530-1600 SLP Time Calculation (min): 30 min  Past Medical History:  Past Medical History  Diagnosis Date  . Hypertension   . Arthritis   . Gout    Past Surgical History:  Past Surgical History  Procedure Laterality Date  . Appendectomy     HPI:  80 y/o female with PMH significant for HTN, arthritis, gout,  bronchitis, and repeat PNA.  Brought to ED with pleuritic chest  pain.  CXR: background pattern of chronic pulmonary scarring.   Bronchopneumonia within right lower lobe with consolidation and  volume loss. Patient reports dysphagia as "getting strangled  during meals every day.  MBS indicated from BSE  to assess risk  for aspiration due to repeated PNA.       Assessment / Plan / Recommendation Clinical Impression  Dysphagia Diagnosis: Mild oral phase dysphagia;Mild pharyngeal  phase dysphagia;Mild cervical esophageal phase dysphagia.  Oral phase marked by piecemeal swallows and delayed oral transit  with all liquid and solid consistencies.   Mild to moderate  pharyngeal phase dysphagia with esophageal involvement.   All  swallows triggered at valleculae with immediate backflow to  pyriforms s/p swallow of thin, nectar, and puree consistencies.   During second swallow, backflow,from pyriforms spill in airway  resulting in trace silent penetration during swallow with  eventual trace aspiration with thin liquid barium by spoon,  cup,  and straw.  Cued throat clear and cough cleared majority of  penetrates and aspirated material.  Chin tuck not effective in  eliminating penetration/aspiration.  Minimal residuals in  vallecular space and pyriforms s/p swallow of thin, nectar, and  puree consistency. Strategies of multiple swallows effective in  clearing residuals.   Appears to be osteophytes at C3 and C6 no  radiologist present to confirm.  Prominent CP muscle noted.   Brief esophageal screen indicates slow bolus transit with  backflow to cervical esophagus noted.  Containment of whole  barium tablet noted at LES.  Recommend to continue current diet  consistency of dysphagia 3 and nectar thick liquids with full  supervision with all meals.  Recommend aspiration and reflux  precautions as risk remains s/p swallow due to noted backflow.   ST to continue in acute care setting for diet tolerance.   Recommend Skilled ST at next level of care.  Repeat MBS with  clinical improvement prior to liquid upgrade as aspiration events  silent.  May benefit from GI consult to further assess esophageal  functioning.      Treatment Recommendation  Therapy as outlined in treatment plan below    Diet Recommendation Dysphagia 3 (Mechanical  Soft);Nectar-thick  liquid   Liquid Administration via: Cup;No straw Medication Administration: Crushed with puree Supervision: Patient able to self feed;Full supervision/cueing  for compensatory strategies Compensations: Slow rate;Small sips/bites;Effortful  swallow;Multiple dry swallows after each bite/sip;Clear throat  intermittently Postural Changes and/or Swallow Maneuvers: Seated upright 90  degrees;Upright 30-60 min after meal    Other  Recommendations Recommended Consults: Consider GI  evaluation Oral Care Recommendations: Oral care Q4 per protocol Other Recommendations: Order thickener from pharmacy;Prohibited  food (jello, ice cream, thin soups);Remove water pitcher;Clarify  dietary restrictions   Follow Up Recommendations  Home health SLP    Frequency and Duration min 2x/week  2 weeks       SLP Swallow Goals     General Date of Onset: 10/07/13 HPI: 80 y/o female with PMH significant for HTN, arthritis, gout,  bronchitis, and repeat PNA.  Brought to ED with pleuritic chest  pain.  CXR: background pattern of chronic pulmonary scarring.   Bronchopneumonia within right lower lobe with consolidation and  volume loss. Patient reports dysphagia as "getting strangled  during meals every day.  MBS indicated from BSE to assess risk  for aspiration due to repeated PNA.   Type of Study: Modified Barium Swallowing Study Reason for Referral: Objectively evaluate swallowing function Previous Swallow Assessment: BSE 10/08/13 Diet Prior to this Study: Dysphagia 3 (soft);Nectar-thick liquids Temperature Spikes Noted: No Respiratory Status: Nasal cannula History of Recent Intubation: No Behavior/Cognition: Alert;Cooperative;Pleasant mood Oral Cavity - Dentition: Missing dentition Oral Motor / Sensory Function: Impaired - see Bedside swallow  eval Self-Feeding Abilities: Able to feed self Patient Positioning: Upright in chair Baseline Vocal Quality: Clear Volitional Cough: Strong Volitional Swallow: Able to elicit Pharyngeal  Secretions: Not observed secondary MBS    Reason for Referral Objectively evaluate swallowing function   Oral Phase Oral Preparation/Oral Phase Oral Phase: Impaired Oral - Nectar Oral - Nectar Teaspoon: Reduced posterior propulsion;Decreased  velopharyngeal closure;Piecemeal swallowing;Delayed oral transit Oral - Nectar Cup: Piecemeal swallowing;Reduced posterior  propulsion;Decreased velopharyngeal closure;Delayed oral transit Oral - Thin Oral - Thin Teaspoon: Incomplete tongue to palate  contact;Piecemeal swallowing;Reduced posterior  propulsion;Decreased velopharyngeal closure;Delayed oral transit Oral - Solids Oral - Puree: Piecemeal swallowing;Incomplete tongue to palate  contact;Decreased velopharyngeal closure Oral - Mechanical Soft: Piecemeal swallowing;Decreased  velopharyngeal closure;Reduced posterior propulsion;Delayed oral  transit   Pharyngeal Phase Pharyngeal Phase Pharyngeal Phase: Impaired Pharyngeal - Nectar Pharyngeal - Nectar Teaspoon: Reduced pharyngeal  peristalsis;Reduced anterior laryngeal mobility;Reduced laryngeal  elevation;Premature spillage to valleculae;Reduced  airway/laryngeal closure;Reduced tongue base  retraction;Pharyngeal residue - pyriform sinuses;Pharyngeal  residue - valleculae;Pharyngeal residue - cp segment Pharyngeal - Nectar Cup: Reduced tongue base retraction;Reduced  pharyngeal peristalsis;Pharyngeal residue - cp segment;Reduced  anterior laryngeal mobility;Premature spillage to  valleculae;Reduced airway/laryngeal closure;Reduced laryngeal  elevation Pharyngeal - Thin Pharyngeal - Thin Teaspoon: Reduced pharyngeal  peristalsis;Reduced tongue base retraction;Penetration/Aspiration  during swallow;Penetration/Aspiration after swallow;Reduced  anterior laryngeal mobility;Reduced laryngeal elevation;Premature  spillage to valleculae;Reduced airway/laryngeal closure;Trace  aspiration;Pharyngeal residue - pyriform sinuses Penetration/Aspiration details (thin teaspoon):  Material enters  airway, remains ABOVE vocal cords and not ejected out;Material  enters airway, passes BELOW cords without attempt by patient to  eject out (silent aspiration) Pharyngeal - Thin Cup: Reduced pharyngeal peristalsis;Reduced  tongue base retraction;Penetration/Aspiration during  swallow;Reduced anterior laryngeal mobility;Premature spillage to  valleculae;Reduced laryngeal elevation;Reduced airway/laryngeal  closure Penetration/Aspiration details (thin cup): Material enters  airway, CONTACTS cords and not ejected out;Material enters  airway, remains ABOVE vocal cords and not ejected out;Material  enters airway, passes BELOW cords without attempt by patient to  eject out (silent aspiration) Pharyngeal - Thin Straw: Reduced tongue base retraction;Reduced  pharyngeal peristalsis;Reduced anterior laryngeal  mobility;Reduced laryngeal elevation;Premature spillage to  valleculae;Reduced airway/laryngeal closure;Trace  aspiration;Pharyngeal residue - cp segment;Penetration/Aspiration  during swallow Penetration/Aspiration details (thin straw): Material enters  airway, passes BELOW cords without attempt by patient to eject  out (silent aspiration);Material enters airway, CONTACTS cords  and not ejected out;Material enters airway, remains ABOVE vocal  cords and not ejected out Pharyngeal - Solids Pharyngeal - Puree: Reduced tongue base retraction;Reduced  pharyngeal peristalsis;Reduced anterior laryngeal  mobility;Reduced laryngeal elevation;Reduced airway/laryngeal  closure;Pharyngeal residue - valleculae;Pharyngeal residue - cp  segment Pharyngeal - Mechanical Soft: Reduced tongue base  retraction;Pharyngeal residue - cp segment;Delayed swallow  initiation;Premature spillage to valleculae;Reduced  airway/laryngeal closure;Reduced laryngeal elevation;Pharyngeal  residue - valleculae  Cervical Esophageal Phase    GO    Cervical Esophageal Phase Cervical Esophageal Phase: Impaired Cervical Esophageal Phase -  Solids Puree: Prominent cricopharyngeal segment;Esophageal backflow into  cervical esophagus Pill: Prominent cricopharyngeal segment;Other (Comment);Reduced  cricopharyngeal relaxation Cervical Esophageal Phase - Comment Cervical Esophageal Comment: contained at [facility] MS, CCC-SLP [phone] [facility] 10/09/2013, 8:05 PM     Jeoffrey Massed, MD  [facility] Pager:[phone]  If 7PM-7AM, please contact night-coverage [email or web address] Password [email or web address] 10/16/2013, 11:03 AM   LOS: 9 days

## 2013-10-17 LAB — CULTURE, BLOOD (ROUTINE X 2)
CULTURE: NO GROWTH
Culture: NO GROWTH

## 2013-10-17 MED ORDER — HEPARIN SOD (PORK) LOCK FLUSH 100 UNIT/ML IV SOLN
250.0000 [IU] | INTRAVENOUS | Status: DC | PRN
  Administered 2013-10-17: 250 [IU]
  Filled 2013-10-17: qty 3

## 2013-10-17 MED ORDER — WHITE PETROLATUM GEL
Status: AC
  Filled 2013-10-17: qty 5

## 2013-10-17 MED ORDER — HEPARIN SOD (PORK) LOCK FLUSH 100 UNIT/ML IV SOLN
250.0000 [IU] | Freq: Every day | INTRAVENOUS | Status: DC
  Administered 2013-10-17: 250 [IU]

## 2013-10-17 NOTE — Progress Notes (Signed)
NURSING PROGRESS NOTE  Lindsay HammingCallie B [name] [id number] Discharge Data: 10/17/2013 6:53 PM Attending Provider: Maretta BeesShanker M Ghimire, MD PCP:KIM, Fayrene FearingJAMES, MD     [name] to be D/C'd Nursing Home per MD order.  All belongings returned to patient for patient to take home.   Last Vital Signs:  Blood pressure 148/76, pulse 66, temperature 97.8 F (36.6 C), temperature source Oral, resp. rate 18, height 5\' 5"  (1.651 m), weight 50.1 kg (110 lb 7.2 oz), SpO2 98.00%.  Discharge Medication List   Medication List    STOP taking these medications       amLODipine 10 MG tablet  Commonly known as:  NORVASC     hydrALAZINE 10 MG tablet  Commonly known as:  APRESOLINE     losartan 100 MG tablet  Commonly known as:  COZAAR      TAKE these medications       albuterol 108 (90 BASE) MCG/ACT inhaler  Commonly known as:  PROVENTIL HFA;VENTOLIN HFA  Inhale 2 puffs into the lungs every 6 (six) hours as needed for wheezing or shortness of breath.     allopurinol 300 MG tablet  Commonly known as:  ZYLOPRIM  Take 450 mg by mouth at bedtime.     atenolol 25 MG tablet  Commonly known as:  TENORMIN  Take 25 mg by mouth 2 (two) times daily.     chlorpheniramine-HYDROcodone 10-8 MG/5ML Lqcr  Commonly known as:  TUSSIONEX  Take 5 mLs by mouth every 12 (twelve) hours as needed for cough.     dextrose 5 % SOLN 50 mL with cefTRIAXone 2 G SOLR 2 g  Inject 2 g into the vein daily. For 28 days from 10/10/13     feeding supplement (RESOURCE BREEZE) Liqd  Take 1 Container by mouth 2 (two) times daily between meals.     oxyCODONE-acetaminophen 5-325 MG per tablet  Commonly known as:  PERCOCET/ROXICET  Take 1 tablet by mouth every 6 (six) hours as needed (pain).     polyethylene glycol packet  Commonly known as:  MIRALAX / GLYCOLAX  Take 17 g by mouth 2 (two) times daily as needed (constipation).     simvastatin 40 MG tablet  Commonly known as:  ZOCOR  Take 40 mg by mouth at bedtime.

## 2013-10-17 NOTE — Progress Notes (Signed)
[facility].

## 2013-10-17 NOTE — Progress Notes (Signed)
Physical Therapy Treatment Patient Details Name: [name] MRN: [id number] DOB: 07/28/1923 Today's Date: 10/17/2013 Time: [id number] PT Time Calculation (min): 9 min  PT Assessment / Plan / Recommendation  History of Present Illness Patient is an 80 yo female with medical history of hypertension arthritis and gout. She was recently treated for bronchitis, was brought to the emergency room pleuritic chest pain. She said that after she started antibiotics she was feeling better but then 3 days prior to admission she started developing pleuritic chest pain cough sneezing and felt very weak.  Patient admitted with CAP, COPD exacerbation, hypokalcemia.   PT Comments   Pt making steady progress. Continue to recommend ST-SNF.  Follow Up Recommendations  SNF     Does the patient have the potential to tolerate intense rehabilitation     Barriers to Discharge        Equipment Recommendations  None recommended by PT    Recommendations for Other Services    Frequency Min 2X/week   Progress towards PT Goals Progress towards PT goals: Progressing toward goals  Plan Frequency needs to be updated    Precautions / Restrictions Precautions Precautions: Fall   Pertinent Vitals/Pain No c/o's    Mobility  Bed Mobility Overal bed mobility: Needs Assistance Bed Mobility: Supine to Sit;Sit to Supine Supine to sit: Min assist Sit to supine: Supervision General bed mobility comments: Assist for elevation of trunk. Transfers Overall transfer level: Needs assistance Equipment used: Rolling walker (2 wheeled) Transfers: Sit to/from Stand Sit to Stand: Min guard General transfer comment: Assist for balance and safety. Ambulation/Gait Ambulation/Gait assistance: Min assist Ambulation Distance (Feet): 25 Feet Assistive device: Rolling walker (2 wheeled) Gait Pattern/deviations: Step-through pattern;Decreased stride length Gait velocity interpretation: Below normal speed for age/gender General  Gait Details: Assist for balance and support.    Exercises     PT Diagnosis:    PT Problem List:   PT Treatment Interventions:     PT Goals (current goals can now be found in the care plan section)    Visit Information  Last PT Received On: 10/17/13 Assistance Needed: +1 History of Present Illness: Patient is an 80 yo female with medical history of hypertension arthritis and gout. She was recently treated for bronchitis, was brought to the emergency room pleuritic chest pain. She said that after she started antibiotics she was feeling better but then 3 days prior to admission she started developing pleuritic chest pain cough sneezing and felt very weak.  Patient admitted with CAP, COPD exacerbation, hypokalcemia.    Subjective Data      Cognition  Cognition Arousal/Alertness: Awake/alert Behavior During Therapy: WFL for tasks assessed/performed Overall Cognitive Status: Within Functional Limits for tasks assessed    Balance  Balance Sitting balance-Leahy Scale: Good Standing balance support: Bilateral upper extremity supported Standing balance-Leahy Scale: Poor Standing balance comment: rolling walker for support.  End of Session PT - End of Session Equipment Utilized During Treatment: Gait belt Activity Tolerance: Patient limited by fatigue Patient left: in bed;with call bell/phone within reach;with bed alarm set Nurse Communication: Mobility status   GP     [facility] Center[facility]YCOCK,Nevin Kozuch 10/17/2013, 10:55 AM  [facility] PT [phone]

## 2013-10-17 NOTE — Progress Notes (Signed)
PATIENT DETAILS Name: [name] Age: 80 y.o. Sex: female Date of Birth: March 17, 1923 Admit Date: 10/07/2013 Admitting Physician Marinda Elk, MD PCP:KIM, Fayrene Fearing, MD  Subjective: No major complaints, awaiting SNF bed  Assessment/Plan: Active Problems:   CAP (community acquired pneumonia) - Patient was admitted and started on vancomycin, cefepime and Levaquin initially. However now has been tapered to Rocephin. Blood cultures are positive for Haemophilus influenzae. Will plan for at least 2 weeks of IV antibiotics with Rocephin from 10/11/13 if TEE negative, however since TEE unable to be done, will empirically treat for 4 weeks.  Haemophilus influenza bacteremia - Likely secondary to above. - 2-D echocardiogram showing? Thrombus in the right atrium with tricuspid regurgitation. Discussed with infectious disease (Dr Drue Second), this organism is not typically associated with endocarditis. TEE attempted on 2/26, unsuccessful. Plans were initially to treat for 2 weeks if TEE was negative, and for 4 weeks if TEE was positive. Since TEE not able to be done, will just empirically treat for 4 weeks.  ? Thrombus in the right atrium - Cardiology note on 2/25, apparently does structures seen in the right atrium is likely to be a prominent remnant of the eustachian valve. Apparently this was seen on her prior echocardiogram on 01/22/2010. - Spoke with Dr. Sherilyn Cooter Smith-cardiology, no further workup needed.  History of chronic bronchitis/COPD - Shortness of breath likely secondary to pneumonia, not wheezing. Continue with inhaled bronchodilators and oxygen. - Taper off oxygen as tolerated.  Hypertension - Blood pressure controlled with just atenolol  Dyslipidemia - Continue statins  History of gout - Stable with allopurinol   Protein-calorie malnutrition, severe - Resource breeze twice a day  Disposition: Remain inpatient-awaiting SNF bed-insurance approval  DVT  Prophylaxis: Prophylactic Heparin  Code Status: Full code   Family Communication None at bedside  Procedures:  None  CONSULTS:  cardiology   MEDICATIONS: Scheduled Meds: . allopurinol  150 mg Oral QHS  . atenolol  12.5 mg Oral Daily  . cefTRIAXone (ROCEPHIN)  IV  2 g Intravenous Q24H  . feeding supplement (RESOURCE BREEZE)  1 Container Oral BID BM  . heparin  5,000 Units Subcutaneous 3 times per day  . simvastatin  40 mg Oral QHS  . sodium chloride  3 mL Intravenous Q12H   Continuous Infusions: . sodium chloride 20 mL/hr at 10/15/13 2155   PRN Meds:.acetaminophen, acetaminophen, albuterol, food thickener, ondansetron (ZOFRAN) IV, ondansetron, oxyCODONE-acetaminophen, polyethylene glycol, sodium chloride, sodium chloride, sodium chloride  Antibiotics: Anti-infectives   Start     Dose/Rate Route Frequency Ordered Stop   10/14/13 0000  dextrose 5 % SOLN 50 mL with cefTRIAXone 2 G SOLR 2 g     2 g 100 mL/hr over 30 Minutes Intravenous Every 24 hours 10/14/13 1046     10/13/13 1000  cefTRIAXone (ROCEPHIN) 2 g in dextrose 5 % 50 mL IVPB     2 g 100 mL/hr over 30 Minutes Intravenous Every 24 hours 10/12/13 1437     10/11/13 1000  cefTRIAXone (ROCEPHIN) 1 g in dextrose 5 % 50 mL IVPB  Status:  Discontinued     1 g 100 mL/hr over 30 Minutes Intravenous Every 24 hours 10/10/13 1509 10/12/13 1437   10/09/13 1300  levofloxacin (LEVAQUIN) IVPB 750 mg  Status:  Discontinued     750 mg 100 mL/hr over 90 Minutes Intravenous Every 48 hours 10/09/13 1218 10/10/13 1508   10/08/13 1630  vancomycin (VANCOCIN) 500 mg in sodium  chloride 0.9 % 100 mL IVPB  Status:  Discontinued     500 mg 100 mL/hr over 60 Minutes Intravenous Every 24 hours 10/08/13 1624 10/09/13 1211   10/08/13 1300  vancomycin (VANCOCIN) 500 mg in sodium chloride 0.9 % 100 mL IVPB  Status:  Discontinued     500 mg 100 mL/hr over 60 Minutes Intravenous Every 24 hours 10/07/13 1242 10/07/13 1637   10/08/13 1200  ceFEPIme  (MAXIPIME) 1 g in dextrose 5 % 50 mL IVPB  Status:  Discontinued     1 g 100 mL/hr over 30 Minutes Intravenous Every 24 hours 10/08/13 1049 10/10/13 1508   10/07/13 1800  cefTRIAXone (ROCEPHIN) 1 g in dextrose 5 % 50 mL IVPB  Status:  Discontinued     1 g 100 mL/hr over 30 Minutes Intravenous Every 24 hours 10/07/13 1723 10/08/13 1000   10/07/13 1800  azithromycin (ZITHROMAX) tablet 500 mg  Status:  Discontinued     500 mg Oral Every 24 hours 10/07/13 1723 10/08/13 1000   10/07/13 1245  vancomycin (VANCOCIN) IVPB 1000 mg/200 mL premix     1,000 mg 200 mL/hr over 60 Minutes Intravenous  Once 10/07/13 1242 10/07/13 1419   10/07/13 1215  ceFEPIme (MAXIPIME) 1 g in dextrose 5 % 50 mL IVPB     1 g 100 mL/hr over 30 Minutes Intravenous  Once 10/07/13 1211 10/07/13 1537       PHYSICAL EXAM: Vital signs in last 24 hours: Filed Vitals:   10/16/13 0522 10/16/13 1441 10/16/13 2054 10/17/13 0539  BP: 160/79 145/76 121/71 165/82  Pulse: 71 76 59 70  Temp: 98.3 F (36.8 C) 98.1 F (36.7 C) 97.6 F (36.4 C) 97.9 F (36.6 C)  TempSrc: Oral Oral Oral Oral  Resp: 22 20 20 20   Height:      Weight:      SpO2: 97% 96% 98% 95%    Weight change:  Filed Weights   10/10/13 0335 10/11/13 0500 10/11/13 2100  Weight: 51.5 kg (113 lb 8.6 oz) 51.7 kg (113 lb 15.7 oz) 50.1 kg (110 lb 7.2 oz)   Body mass index is 18.38 kg/(m^2).   Gen Exam: Awake and alert with clear speech.  Not in any distress Neck: Supple, No JVD.   Chest: Good air entry bilaterally, still has rales on the right lung base.No rhonchi CVS: S1 S2 Regular, no murmurs.  Abdomen: soft, BS +, non tender, non distended.  Extremities: no edema, lower extremities warm to touch. Neurologic: Non Focal-but with gen. weakness Skin: No Rash.   Wounds: N/A.    Intake/Output from previous day: No intake or output data in the 24 hours ending 10/17/13 1226   LAB RESULTS: CBC  Recent Labs Lab 10/11/13 0253 10/12/13 0451  WBC 15.7*  10.9*  HGB 10.3* 10.2*  HCT 30.9* 31.0*  PLT 232 254  MCV 88.5 88.8  MCH 29.5 29.2  MCHC 33.3 32.9  RDW 14.3 14.4    Chemistries  No results found for this basename: NA, K, CL, CO2, GLUCOSE, BUN, CREATININE, CALCIUM, MG,  in the last 168 hours  CBG: No results found for this basename: GLUCAP,  in the last 168 hours  GFR Estimated Creatinine Clearance: 37.5 ml/min (by C-G formula based on Cr of 0.82).  Coagulation profile No results found for this basename: INR, PROTIME,  in the last 168 hours  Cardiac Enzymes  Recent Labs Lab 10/10/13 1415 10/10/13 2022  TROPONINI <0.30 <0.30    No components  found with this basename: POCBNP,  No results found for this basename: DDIMER,  in the last 72 hours No results found for this basename: HGBA1C,  in the last 72 hours No results found for this basename: CHOL, HDL, LDLCALC, TRIG, CHOLHDL, LDLDIRECT,  in the last 72 hours No results found for this basename: TSH, T4TOTAL, FREET3, T3FREE, THYROIDAB,  in the last 72 hours No results found for this basename: VITAMINB12, FOLATE, FERRITIN, TIBC, IRON, RETICCTPCT,  in the last 72 hours No results found for this basename: LIPASE, AMYLASE,  in the last 72 hours  Urine Studies No results found for this basename: UACOL, UAPR, USPG, UPH, UTP, UGL, UKET, UBIL, UHGB, UNIT, UROB, ULEU, UEPI, UWBC, URBC, UBAC, CAST, CRYS, UCOM, BILUA,  in the last 72 hours  MICROBIOLOGY: Recent Results (from the past 240 hour(s))  CULTURE, BLOOD (ROUTINE X 2)     Status: None   Collection Time    10/07/13 12:39 PM      Result Value Ref Range Status   Specimen Description BLOOD RIGHT HAND   Final   Special Requests BOTTLES DRAWN AEROBIC AND ANAEROBIC 5CC   Final   Culture  Setup Time     Final   Value: 10/07/2013 16:50     Performed at [facility]   Culture     Final   Value: HAEMOPHILUS INFLUENZAE     Note: BETA LACTAMASE NEGATIVE [facility] to [facility] [place] Ser[place]. CRITICAL RESULT CALLED TO, READ BACK BY AND VERIFIED WITH: JENNIFER MAY @ 1004 10/10/13 BY KRAWS [facility] 10/12/13      0926 BY PEAKY     Note: Gram Stain Report Called to,Read Back By and Verified With: Isidor HoltsJOANNE HANZE 10/08/13 @ 12:23PM BY RUSCOE A.     Performed at [facility]   Report Status PENDING   Incomplete  CULTURE, BLOOD (ROUTINE X 2)     Status: None   Collection Time    10/07/13 12:45 PM      Result Value Ref Range Status   Specimen Description BLOOD LEFT HAND   Final   Special Requests BOTTLES DRAWN AEROBIC ONLY 5CC   Final   Culture  Setup Time     Final   Value: 10/07/2013 16:51     Performed at [facility]   Culture     Final   Value: HAEMOPHILUS INFLUENZAE     Note: BETA LACTAMASE NEGATIVE [facility] 10/12/13 0926 BY PEAKY     Note: Gram Stain Report Called to,Read Back By and Verified With: Isidor HoltsJOANNE HANZE 10/08/13 @ 12:23PM BY RUSCOE A.     Performed at [facility]   Report Status 10/12/2013 FINAL   Final  CULTURE, BLOOD (ROUTINE X 2)     Status: None   Collection Time    10/10/13  8:22 PM      Result Value Ref Range Status   Specimen Description BLOOD RIGHT FOREARM   Final   Special Requests BOTTLES DRAWN AEROBIC ONLY 8CC   Final   Culture  Setup Time     Final   Value: 10/11/2013 00:58     Performed at [facility]   Culture     Final   Value: NO GROWTH 5 DAYS     Performed at [facility]   Report Status 10/17/2013 FINAL  Final  CULTURE, BLOOD (ROUTINE X 2)     Status: None   Collection Time    10/10/13  8:25 PM      Result Value Ref Range Status   Specimen Description BLOOD LEFT FOREARM   Final   Special Requests BOTTLES DRAWN AEROBIC ONLY 10CC   Final   Culture  Setup Time     Final   Value: 10/11/2013 00:58     Performed at [facility]   Culture     Final   Value: NO GROWTH 5 DAYS     Performed  at [facility]   Report Status 10/17/2013 FINAL   Final    RADIOLOGY STUDIES/RESULTS: Dg Chest 2 View  10/07/2013   CLINICAL DATA:  Cough.  Chest congestion.  Chest pain.  EXAM: CHEST  2 VIEW  COMPARISON:  06/09/2013  FINDINGS: Heart size is normal. The left lung shows chronic scarring. There is a infiltrate and volume loss in the right lower lobe consistent with bronchopneumonia. Right upper lobe remains clear. No effusions. No significant bony finding.  IMPRESSION: Background pattern of chronic pulmonary scarring. Bronchopneumonia within the right lower lobe with consolidation and volume loss.   Electronically Signed   By: Paulina Fusi M.D.   On: 10/07/2013 11:58   Dg Swallowing Func-speech Pathology  10/09/2013   Lacinda Axon, CCC-SLP     10/09/2013  8:28 PM Objective Swallowing Evaluation: Modified Barium Swallowing Study   Patient Details  Name: [name] MRN: [id number] Date of Birth: [birth year]  Today's Date: 10/09/2013 Time: 1530-1600 SLP Time Calculation (min): 30 min  Past Medical History:  Past Medical History  Diagnosis Date  . Hypertension   . Arthritis   . Gout    Past Surgical History:  Past Surgical History  Procedure Laterality Date  . Appendectomy     HPI:  80 y/o female with PMH significant for HTN, arthritis, gout,  bronchitis, and repeat PNA.  Brought to ED with pleuritic chest  pain.  CXR: background pattern of chronic pulmonary scarring.   Bronchopneumonia within right lower lobe with consolidation and  volume loss. Patient reports dysphagia as "getting strangled  during meals every day.  MBS indicated from BSE to assess risk  for aspiration due to repeated PNA.       Assessment / Plan / Recommendation Clinical Impression  Dysphagia Diagnosis: Mild oral phase dysphagia;Mild pharyngeal  phase dysphagia;Mild cervical esophageal phase dysphagia.  Oral phase marked by piecemeal swallows and delayed oral transit  with all liquid and solid consistencies.   Mild to moderate   pharyngeal phase dysphagia with esophageal involvement.   All  swallows triggered at valleculae with immediate backflow to  pyriforms s/p swallow of thin, nectar, and puree consistencies.   During second swallow, backflow,from pyriforms spill in airway  resulting in trace silent penetration during swallow with  eventual trace aspiration with thin liquid barium by spoon, cup,  and straw.  Cued throat clear and cough cleared majority of  penetrates and aspirated material.  Chin tuck not effective in  eliminating penetration/aspiration.  Minimal residuals in  vallecular space and pyriforms s/p swallow of thin, nectar, and  puree consistency. Strategies of multiple swallows effective in  clearing residuals.   Appears to be osteophytes at C3 and C6 no  radiologist present to confirm.  Prominent CP muscle noted.   Brief esophageal screen indicates slow bolus transit with  backflow to cervical esophagus noted.  Containment of whole  barium  tablet noted at LES.  Recommend to continue current diet  consistency of dysphagia 3 and nectar thick liquids with full  supervision with all meals.  Recommend aspiration and reflux  precautions as risk remains s/p swallow due to noted backflow.   ST to continue in acute care setting for diet tolerance.   Recommend Skilled ST at next level of care.  Repeat MBS with  clinical improvement prior to liquid upgrade as aspiration events  silent.  May benefit from GI consult to further assess esophageal  functioning.      Treatment Recommendation  Therapy as outlined in treatment plan below    Diet Recommendation Dysphagia 3 (Mechanical Soft);Nectar-thick  liquid   Liquid Administration via: Cup;No straw Medication Administration: Crushed with puree Supervision: Patient able to self feed;Full supervision/cueing  for compensatory strategies Compensations: Slow rate;Small sips/bites;Effortful  swallow;Multiple dry swallows after each bite/sip;Clear throat  intermittently Postural Changes and/or  Swallow Maneuvers: Seated upright 90  degrees;Upright 30-60 min after meal    Other  Recommendations Recommended Consults: Consider GI  evaluation Oral Care Recommendations: Oral care Q4 per protocol Other Recommendations: Order thickener from pharmacy;Prohibited  food (jello, ice cream, thin soups);Remove water pitcher;Clarify  dietary restrictions   Follow Up Recommendations  Home health SLP    Frequency and Duration min 2x/week  2 weeks       SLP Swallow Goals     General Date of Onset: 10/07/13 HPI: 80 y/o female with PMH significant for HTN, arthritis, gout,  bronchitis, and repeat PNA.  Brought to ED with pleuritic chest  pain.  CXR: background pattern of chronic pulmonary scarring.   Bronchopneumonia within right lower lobe with consolidation and  volume loss. Patient reports dysphagia as "getting strangled  during meals every day.  MBS indicated from BSE to assess risk  for aspiration due to repeated PNA.   Type of Study: Modified Barium Swallowing Study Reason for Referral: Objectively evaluate swallowing function Previous Swallow Assessment: BSE 10/08/13 Diet Prior to this Study: Dysphagia 3 (soft);Nectar-thick liquids Temperature Spikes Noted: No Respiratory Status: Nasal cannula History of Recent Intubation: No Behavior/Cognition: Alert;Cooperative;Pleasant mood Oral Cavity - Dentition: Missing dentition Oral Motor / Sensory Function: Impaired - see Bedside swallow  eval Self-Feeding Abilities: Able to feed self Patient Positioning: Upright in chair Baseline Vocal Quality: Clear Volitional Cough: Strong Volitional Swallow: Able to elicit Pharyngeal Secretions: Not observed secondary MBS    Reason for Referral Objectively evaluate swallowing function   Oral Phase Oral Preparation/Oral Phase Oral Phase: Impaired Oral - Nectar Oral - Nectar Teaspoon: Reduced posterior propulsion;Decreased  velopharyngeal closure;Piecemeal swallowing;Delayed oral transit Oral - Nectar Cup: Piecemeal swallowing;Reduced  posterior  propulsion;Decreased velopharyngeal closure;Delayed oral transit Oral - Thin Oral - Thin Teaspoon: Incomplete tongue to palate  contact;Piecemeal swallowing;Reduced posterior  propulsion;Decreased velopharyngeal closure;Delayed oral transit Oral - Solids Oral - Puree: Piecemeal swallowing;Incomplete tongue to palate  contact;Decreased velopharyngeal closure Oral - Mechanical Soft: Piecemeal swallowing;Decreased  velopharyngeal closure;Reduced posterior propulsion;Delayed oral  transit   Pharyngeal Phase Pharyngeal Phase Pharyngeal Phase: Impaired Pharyngeal - Nectar Pharyngeal - Nectar Teaspoon: Reduced pharyngeal  peristalsis;Reduced anterior laryngeal mobility;Reduced laryngeal  elevation;Premature spillage to valleculae;Reduced  airway/laryngeal closure;Reduced tongue base  retraction;Pharyngeal residue - pyriform sinuses;Pharyngeal  residue - valleculae;Pharyngeal residue - cp segment Pharyngeal - Nectar Cup: Reduced tongue base retraction;Reduced  pharyngeal peristalsis;Pharyngeal residue - cp segment;Reduced  anterior laryngeal mobility;Premature spillage to  valleculae;Reduced airway/laryngeal closure;Reduced laryngeal  elevation Pharyngeal - Thin Pharyngeal - Thin Teaspoon: Reduced pharyngeal  peristalsis;Reduced tongue base  retraction;Penetration/Aspiration  during swallow;Penetration/Aspiration after swallow;Reduced  anterior laryngeal mobility;Reduced laryngeal elevation;Premature  spillage to valleculae;Reduced airway/laryngeal closure;Trace  aspiration;Pharyngeal residue - pyriform sinuses Penetration/Aspiration details (thin teaspoon): Material enters  airway, remains ABOVE vocal cords and not ejected out;Material  enters airway, passes BELOW cords without attempt by patient to  eject out (silent aspiration) Pharyngeal - Thin Cup: Reduced pharyngeal peristalsis;Reduced  tongue base retraction;Penetration/Aspiration during  swallow;Reduced anterior laryngeal mobility;Premature spillage to   valleculae;Reduced laryngeal elevation;Reduced airway/laryngeal  closure Penetration/Aspiration details (thin cup): Material enters  airway, CONTACTS cords and not ejected out;Material enters  airway, remains ABOVE vocal cords and not ejected out;Material  enters airway, passes BELOW cords without attempt by patient to  eject out (silent aspiration) Pharyngeal - Thin Straw: Reduced tongue base retraction;Reduced  pharyngeal peristalsis;Reduced anterior laryngeal  mobility;Reduced laryngeal elevation;Premature spillage to  valleculae;Reduced airway/laryngeal closure;Trace  aspiration;Pharyngeal residue - cp segment;Penetration/Aspiration  during swallow Penetration/Aspiration details (thin straw): Material enters  airway, passes BELOW cords without attempt by patient to eject  out (silent aspiration);Material enters airway, CONTACTS cords  and not ejected out;Material enters airway, remains ABOVE vocal  cords and not ejected out Pharyngeal - Solids Pharyngeal - Puree: Reduced tongue base retraction;Reduced  pharyngeal peristalsis;Reduced anterior laryngeal  mobility;Reduced laryngeal elevation;Reduced airway/laryngeal  closure;Pharyngeal residue - valleculae;Pharyngeal residue - cp  segment Pharyngeal - Mechanical Soft: Reduced tongue base  retraction;Pharyngeal residue - cp segment;Delayed swallow  initiation;Premature spillage to valleculae;Reduced  airway/laryngeal closure;Reduced laryngeal elevation;Pharyngeal  residue - valleculae  Cervical Esophageal Phase    GO    Cervical Esophageal Phase Cervical Esophageal Phase: Impaired Cervical Esophageal Phase - Solids Puree: Prominent cricopharyngeal segment;Esophageal backflow into  cervical esophagus Pill: Prominent cricopharyngeal segment;Other (Comment);Reduced  cricopharyngeal relaxation Cervical Esophageal Phase - Comment Cervical Esophageal Comment: contained at [facility] MS, CCC-SLP [phone] [facility] 10/09/2013, 8:05 PM     Jeoffrey Massed,  MD  [facility] Pager:[phone]  If 7PM-7AM, please contact night-coverage [email or web address] Password [email or web address] 10/17/2013, 12:26 PM   LOS: 10 days

## 2013-10-17 NOTE — Clinical Social Work Placement (Signed)
Clinical Social Work Department CLINICAL SOCIAL WORK PLACEMENT NOTE 10/17/2013  Patient:  [name],Jeanice B  Account Number:  [id number] Admit date:  10/07/2013  Clinical Social Worker:  Cherre BlancJOSEPH BRYANT Eleanore Junio, [place]  Date/time:  10/13/2013 04:23 PM  Clinical Social Work is seeking post-discharge placement for this patient at the following level of care:   SKILLED NURSING   (*CSW will update this form in Epic as items are completed)   10/13/2013  [facility]family provided with [facility] of Clinical Social Work's list of facilities offering this level of care within the geographic area requested by the patient (or if unable, by the patient's family).  10/13/2013  Patient/family informed of their freedom to choose among providers that offer the needed level of care, that participate in Medicare, Medicaid or managed care program needed by the patient, have an available bed and are willing to accept the patient.  10/13/2013  Patient/family informed of [facility]' ownership interest in [facility], as well as of the fact that they are under no obligation to receive care at this facility.  PASARR submitted to EDS on  PASARR number received from EDS on   FL2 transmitted to all facilities in geographic area requested by pt/family on  10/13/2013 FL2 transmitted to all facilities within larger geographic area on   Patient informed that his/her managed care company has contracts with or will negotiate with  certain facilities, including the following:     Patient/family informed of bed offers received:  10/13/2013 [facility] Physician recommends and patient chooses bed at    [facility]  10/17/2013 Patient to be transferred to facility by Ambulance  The following physician request were entered in Epic:   Additional Comments: Per MD [facility]  RN, patient, and facility aware of DC. RN given number for report. DC packet on chart. CSW will request ambulance transport for patient when appropriate and will sign off.   Roddie McBryant Kafi Dotter, [place], [place], [phone]

## 2013-10-26 LAB — CULTURE, BLOOD (ROUTINE X 2)

## 2013-11-01 ENCOUNTER — Ambulatory Visit (INDEPENDENT_AMBULATORY_CARE_PROVIDER_SITE_OTHER): Payer: Medicare HMO | Admitting: Internal Medicine

## 2013-11-01 ENCOUNTER — Encounter: Payer: Self-pay | Admitting: Internal Medicine

## 2013-11-01 VITALS — BP 131/77 | HR 71 | Temp 97.1°F | Wt 104.0 lb

## 2013-11-01 DIAGNOSIS — A419 Sepsis, unspecified organism: Secondary | ICD-10-CM

## 2013-11-01 DIAGNOSIS — A413 Sepsis due to Hemophilus influenzae: Secondary | ICD-10-CM

## 2013-11-01 NOTE — Progress Notes (Signed)
Subjective:    Patient ID: [name], female    DOB: 1922-10-10, 80 y.o.   MRN: [id number]  HPI [name], [phone] T with COPD who was hospitalized for bronchopneumonia and found to have associated H.influenza bacteremia, noted on blood cx 2/20. Repeat blood cx on 2/23 were negative.. She had TTE that showed right atrial thrombus and unable to do TEE. 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 She is curently on day 25/28. She is tolerating it well. No diarrhea, no rash, no thrush, no pain at picc line site.  Current Outpatient Prescriptions on File Prior to Visit  Medication Sig Dispense Refill  . albuterol (PROVENTIL HFA;VENTOLIN HFA) 108 (90 BASE) MCG/ACT inhaler Inhale 2 puffs into the lungs every 6 (six) hours as needed for wheezing or shortness of breath.      . allopurinol (ZYLOPRIM) 300 MG tablet Take 450 mg by mouth at bedtime.       Marland Kitchen atenolol (TENORMIN) 25 MG tablet Take 25 mg by mouth 2 (two) times daily.      . chlorpheniramine-HYDROcodone (TUSSIONEX) 10-8 MG/5ML LQCR Take 5 mLs by mouth every 12 (twelve) hours as needed for cough.  115 mL  0  . dextrose 5 % SOLN 50 mL with cefTRIAXone 2 G SOLR 2 g Inject 2 g into the vein daily. For 28 days from 10/10/13      . feeding supplement, RESOURCE BREEZE, (RESOURCE BREEZE) LIQD Take 1 Container by mouth 2 (two) times daily between meals.    0  . oxyCODONE-acetaminophen (PERCOCET/ROXICET) 5-325 MG per tablet Take 1 tablet by mouth every 6 (six) hours as needed (pain).  30 tablet  0  . polyethylene glycol (MIRALAX / GLYCOLAX) packet Take 17 g by mouth 2 (two) times daily as needed (constipation).  14 each  0  . simvastatin (ZOCOR) 40 MG tablet Take 40 mg by mouth at bedtime.        No current facility-administered medications on file prior to visit.   Active Ambulatory Problems    Diagnosis Date Noted  . CAP (community acquired pneumonia) 12/02/2012  . SOB (shortness of breath) 12/02/2012  .  HTN (hypertension) 12/02/2012  . Gouty arthritis 12/02/2012  . Leukocytopenia, unspecified 12/03/2012  . H. influenzae septicemia 12/06/2012  . Community acquired pneumonia 10/07/2013  . Acute respiratory failure 10/07/2013  . Hypokalemia 10/07/2013  . COPD with exacerbation 10/07/2013  . Protein-calorie malnutrition, severe 10/10/2013   Resolved Ambulatory Problems    Diagnosis Date Noted  . No Resolved Ambulatory Problems   Past Medical History  Diagnosis Date  . Hypertension   . Arthritis   . Gout   . H/O hiatal hernia   . GERD (gastroesophageal reflux disease)   . Bronchitis    History  Substance Use Topics  . Smoking status: Former Smoker -- 0.50 packs/day for 70 years  . Smokeless tobacco: Former Neurosurgeon    Quit date: 10/13/2004  . Alcohol Use: No  family history includes Cancer in her mother; Stroke in her father.    Review of Systems 10 point ros is negative, except for constipation    Objective:   Physical Exam BP 131/77  Pulse 71  Temp(Src) 97.1 F (36.2 C) (Oral)  Wt 104 lb (47.174 kg)  SpO2 96% Physical Exam  Constitutional:  oriented to person, place, and time. appears well-developed and well-nourished. No distress. Thin frail female HENT: poor dentition Mouth/Throat: Oropharynx is clear and moist. No oropharyngeal  exudate.  Cardiovascular: Normal rate, regular rhythm and normal heart sounds. 3/6 SEM BH apex radiating to USb Pulmonary/Chest: Effort normal and breath sounds normal. No respiratory distress.  has no wheezes.  Abdominal: Soft. Bowel sounds are normal.  exhibits no distension. There is no tenderness.  Lymphadenopathy: no cervical adenopathy.  Neurological: alert and oriented to person, place, and time.  Skin: Skin is warm and dry. No rash noted. No erythema.  picc line is c/d/i Psychiatric: a normal mood and affect.  behavior is normal.       Assessment & Plan:  Complicated h.flu bacteremia = she was treated for 28 days due to concern  for endocarditis based upon TTE findings. However, after speaking with cardiologist, it looks like right atrial abnormality was seen on previous studies. It could potential service as nidus for vegetation. She is currently on day 25 of 28 of ceftriaxone. Continue to finish course of therapy, ending on 3/20. Will arrange for home health to pull picc line  rtc prn

## 2014-01-13 ENCOUNTER — Inpatient Hospital Stay (HOSPITAL_COMMUNITY)
Admission: EM | Admit: 2014-01-13 | Discharge: 2014-01-18 | DRG: 388 | Disposition: A | Discharge status: Home Health | Payer: Medicare HMO | Attending: Internal Medicine | Admitting: Internal Medicine

## 2014-01-13 ENCOUNTER — Encounter (HOSPITAL_COMMUNITY): Payer: Self-pay | Admitting: Emergency Medicine

## 2014-01-13 DIAGNOSIS — J9600 Acute respiratory failure, unspecified whether with hypoxia or hypercapnia: Secondary | ICD-10-CM

## 2014-01-13 DIAGNOSIS — J189 Pneumonia, unspecified organism: Secondary | ICD-10-CM

## 2014-01-13 DIAGNOSIS — E785 Hyperlipidemia, unspecified: Secondary | ICD-10-CM | POA: Diagnosis present

## 2014-01-13 DIAGNOSIS — K565 Intestinal adhesions [bands], unspecified as to partial versus complete obstruction: Principal | ICD-10-CM | POA: Diagnosis present

## 2014-01-13 DIAGNOSIS — K219 Gastro-esophageal reflux disease without esophagitis: Secondary | ICD-10-CM | POA: Diagnosis present

## 2014-01-13 DIAGNOSIS — R0602 Shortness of breath: Secondary | ICD-10-CM

## 2014-01-13 DIAGNOSIS — M109 Gout, unspecified: Secondary | ICD-10-CM | POA: Diagnosis present

## 2014-01-13 DIAGNOSIS — D72819 Decreased white blood cell count, unspecified: Secondary | ICD-10-CM

## 2014-01-13 DIAGNOSIS — Z79899 Other long term (current) drug therapy: Secondary | ICD-10-CM

## 2014-01-13 DIAGNOSIS — I10 Essential (primary) hypertension: Secondary | ICD-10-CM | POA: Diagnosis present

## 2014-01-13 DIAGNOSIS — A413 Sepsis due to Hemophilus influenzae: Secondary | ICD-10-CM

## 2014-01-13 DIAGNOSIS — J4489 Other specified chronic obstructive pulmonary disease: Secondary | ICD-10-CM | POA: Diagnosis present

## 2014-01-13 DIAGNOSIS — Z87891 Personal history of nicotine dependence: Secondary | ICD-10-CM

## 2014-01-13 DIAGNOSIS — E876 Hypokalemia: Secondary | ICD-10-CM | POA: Diagnosis present

## 2014-01-13 DIAGNOSIS — K56609 Unspecified intestinal obstruction, unspecified as to partial versus complete obstruction: Secondary | ICD-10-CM

## 2014-01-13 DIAGNOSIS — Z681 Body mass index (BMI) 19 or less, adult: Secondary | ICD-10-CM

## 2014-01-13 DIAGNOSIS — M129 Arthropathy, unspecified: Secondary | ICD-10-CM | POA: Diagnosis present

## 2014-01-13 DIAGNOSIS — E43 Unspecified severe protein-calorie malnutrition: Secondary | ICD-10-CM | POA: Diagnosis present

## 2014-01-13 DIAGNOSIS — J441 Chronic obstructive pulmonary disease with (acute) exacerbation: Secondary | ICD-10-CM

## 2014-01-13 DIAGNOSIS — J449 Chronic obstructive pulmonary disease, unspecified: Secondary | ICD-10-CM | POA: Diagnosis present

## 2014-01-13 NOTE — ED Notes (Signed)
Patient with abdominal pain that started acutely 30 minutes ago.  Patient does have some nausea and was given 4mg  Zofran en route to ED.  The abdominal pain radiates up to her chest and out bilaterally to arms.  Patient denies any back pain.  Patient is CAOx3 at this time.  Patient is from home.

## 2014-01-14 ENCOUNTER — Inpatient Hospital Stay (HOSPITAL_COMMUNITY): Payer: Medicare HMO

## 2014-01-14 ENCOUNTER — Emergency Department (HOSPITAL_COMMUNITY): Payer: Medicare HMO

## 2014-01-14 DIAGNOSIS — K56609 Unspecified intestinal obstruction, unspecified as to partial versus complete obstruction: Secondary | ICD-10-CM | POA: Diagnosis present

## 2014-01-14 DIAGNOSIS — E876 Hypokalemia: Secondary | ICD-10-CM

## 2014-01-14 LAB — CBC WITH DIFFERENTIAL/PLATELET
BASOS ABS: 0 10*3/uL (ref 0.0–0.1)
BASOS PCT: 0 % (ref 0–1)
Eosinophils Absolute: 0.2 10*3/uL (ref 0.0–0.7)
Eosinophils Relative: 3 % (ref 0–5)
HCT: 39.3 % (ref 36.0–46.0)
Hemoglobin: 13 g/dL (ref 12.0–15.0)
Lymphocytes Relative: 28 % (ref 12–46)
Lymphs Abs: 1.9 10*3/uL (ref 0.7–4.0)
MCH: 29.9 pg (ref 26.0–34.0)
MCHC: 33.1 g/dL (ref 30.0–36.0)
MCV: 90.3 fL (ref 78.0–100.0)
Monocytes Absolute: 0.4 10*3/uL (ref 0.1–1.0)
Monocytes Relative: 5 % (ref 3–12)
NEUTROS PCT: 64 % (ref 43–77)
Neutro Abs: 4.4 10*3/uL (ref 1.7–7.7)
PLATELETS: 295 10*3/uL (ref 150–400)
RBC: 4.35 MIL/uL (ref 3.87–5.11)
RDW: 13.8 % (ref 11.5–15.5)
WBC: 6.9 10*3/uL (ref 4.0–10.5)

## 2014-01-14 LAB — URINALYSIS, ROUTINE W REFLEX MICROSCOPIC
Bilirubin Urine: NEGATIVE
Glucose, UA: NEGATIVE mg/dL
Hgb urine dipstick: NEGATIVE
Ketones, ur: 15 mg/dL — AB
Leukocytes, UA: NEGATIVE
NITRITE: NEGATIVE
PH: 6.5 (ref 5.0–8.0)
Protein, ur: NEGATIVE mg/dL
SPECIFIC GRAVITY, URINE: 1.016 (ref 1.005–1.030)
Urobilinogen, UA: 1 mg/dL (ref 0.0–1.0)

## 2014-01-14 LAB — LIPASE, BLOOD: LIPASE: 33 U/L (ref 11–59)

## 2014-01-14 LAB — COMPREHENSIVE METABOLIC PANEL
ALBUMIN: 4 g/dL (ref 3.5–5.2)
ALK PHOS: 80 U/L (ref 39–117)
ALT: 13 U/L (ref 0–35)
AST: 24 U/L (ref 0–37)
BUN: 21 mg/dL (ref 6–23)
CO2: 28 mEq/L (ref 19–32)
Calcium: 10.5 mg/dL (ref 8.4–10.5)
Chloride: 103 mEq/L (ref 96–112)
Creatinine, Ser: 1.01 mg/dL (ref 0.50–1.10)
GFR calc Af Amer: 55 mL/min — ABNORMAL LOW (ref >90)
GFR calc non Af Amer: 48 mL/min — ABNORMAL LOW (ref >90)
Glucose, Bld: 137 mg/dL — ABNORMAL HIGH (ref 70–99)
POTASSIUM: 3.2 meq/L — AB (ref 3.7–5.3)
SODIUM: 144 meq/L (ref 137–147)
TOTAL PROTEIN: 7.8 g/dL (ref 6.0–8.3)
Total Bilirubin: 0.4 mg/dL (ref 0.3–1.2)

## 2014-01-14 LAB — I-STAT TROPONIN, ED: TROPONIN I, POC: 0 ng/mL (ref 0.00–0.08)

## 2014-01-14 LAB — I-STAT CG4 LACTIC ACID, ED: Lactic Acid, Venous: 0.78 mmol/L (ref 0.5–2.2)

## 2014-01-14 MED ORDER — ONDANSETRON HCL 4 MG/2ML IJ SOLN: 4.0000 mg | Freq: Four times a day (QID) | INTRAMUSCULAR | Status: DC | PRN

## 2014-01-14 MED ORDER — HEPARIN SODIUM (PORCINE) 5000 UNIT/ML IJ SOLN
5000.0000 [IU] | Freq: Three times a day (TID) | INTRAMUSCULAR | Status: DC
  Administered 2014-01-14 – 2014-01-18 (×12): 5000 [IU] via SUBCUTANEOUS
  Filled 2014-01-14 (×16): qty 1

## 2014-01-14 MED ORDER — ONDANSETRON HCL 4 MG PO TABS: 4.0000 mg | ORAL_TABLET | Freq: Four times a day (QID) | ORAL | Status: DC | PRN

## 2014-01-14 MED ORDER — MORPHINE SULFATE 4 MG/ML IJ SOLN
4.0000 mg | Freq: Once | INTRAMUSCULAR | Status: AC
Start: 2014-01-14 — End: 2014-01-14
  Administered 2014-01-14: 4 mg via INTRAVENOUS
  Filled 2014-01-14: qty 1

## 2014-01-14 MED ORDER — ONDANSETRON HCL 4 MG/2ML IJ SOLN
4.0000 mg | Freq: Once | INTRAMUSCULAR | Status: AC
  Administered 2014-01-14: 4 mg via INTRAVENOUS
  Filled 2014-01-14: qty 2

## 2014-01-14 MED ORDER — POTASSIUM CHLORIDE 10 MEQ/100ML IV SOLN
10.0000 meq | INTRAVENOUS | Status: AC
  Administered 2014-01-14 (×2): 10 meq via INTRAVENOUS
  Filled 2014-01-14 (×2): qty 100

## 2014-01-14 MED ORDER — IPRATROPIUM-ALBUTEROL 0.5-2.5 (3) MG/3ML IN SOLN: 3.0000 mL | Freq: Four times a day (QID) | RESPIRATORY_TRACT | Status: DC | PRN

## 2014-01-14 MED ORDER — CHLORHEXIDINE GLUCONATE 0.12 % MT SOLN
15.0000 mL | Freq: Two times a day (BID) | OROMUCOSAL | Status: DC
  Administered 2014-01-14 – 2014-01-16 (×5): 15 mL via OROMUCOSAL
  Filled 2014-01-14 (×9): qty 15

## 2014-01-14 MED ORDER — MORPHINE SULFATE 2 MG/ML IJ SOLN
2.0000 mg | INTRAMUSCULAR | Status: DC | PRN
  Administered 2014-01-14: 2 mg via INTRAVENOUS
  Filled 2014-01-14: qty 1

## 2014-01-14 MED ORDER — ACETAMINOPHEN 325 MG PO TABS: 650.0000 mg | ORAL_TABLET | Freq: Four times a day (QID) | ORAL | Status: DC | PRN

## 2014-01-14 MED ORDER — IOHEXOL 300 MG/ML  SOLN
80.0000 mL | Freq: Once | INTRAMUSCULAR | Status: AC | PRN
  Administered 2014-01-14: 80 mL via INTRAVENOUS

## 2014-01-14 MED ORDER — METOPROLOL TARTRATE 1 MG/ML IV SOLN
2.5000 mg | Freq: Four times a day (QID) | INTRAVENOUS | Status: DC
  Administered 2014-01-14 – 2014-01-15 (×6): 2.5 mg via INTRAVENOUS
  Filled 2014-01-14 (×10): qty 5

## 2014-01-14 MED ORDER — MORPHINE SULFATE 4 MG/ML IJ SOLN
4.0000 mg | Freq: Once | INTRAMUSCULAR | Status: AC
  Administered 2014-01-14: 4 mg via INTRAVENOUS
  Filled 2014-01-14: qty 1

## 2014-01-14 MED ORDER — ACETAMINOPHEN 650 MG RE SUPP: 650.0000 mg | Freq: Four times a day (QID) | RECTAL | Status: DC | PRN

## 2014-01-14 MED ORDER — IPRATROPIUM-ALBUTEROL 0.5-2.5 (3) MG/3ML IN SOLN
3.0000 mL | Freq: Four times a day (QID) | RESPIRATORY_TRACT | Status: DC
  Administered 2014-01-14: 3 mL via RESPIRATORY_TRACT
  Filled 2014-01-14 (×2): qty 3

## 2014-01-14 MED ORDER — MORPHINE SULFATE 2 MG/ML IJ SOLN
2.0000 mg | INTRAMUSCULAR | Status: DC | PRN
  Administered 2014-01-14 – 2014-01-16 (×2): 2 mg via INTRAVENOUS
  Filled 2014-01-14 (×3): qty 1

## 2014-01-14 MED ORDER — SODIUM CHLORIDE 0.9 % IV SOLN
INTRAVENOUS | Status: DC
  Administered 2014-01-14 – 2014-01-17 (×6): via INTRAVENOUS
  Filled 2014-01-14 (×11): qty 1000

## 2014-01-14 MED ORDER — BIOTENE DRY MOUTH MT LIQD
15.0000 mL | Freq: Two times a day (BID) | OROMUCOSAL | Status: DC
  Administered 2014-01-14 – 2014-01-16 (×6): 15 mL via OROMUCOSAL

## 2014-01-14 MED ORDER — FENTANYL CITRATE 0.05 MG/ML IJ SOLN
50.0000 ug | Freq: Once | INTRAMUSCULAR | Status: AC
  Administered 2014-01-14: 50 ug via INTRAVENOUS
  Filled 2014-01-14: qty 2

## 2014-01-14 NOTE — ED Notes (Signed)
The pt is c/o abd pain  Still.  She relaxes for awhile the starts moving around in pain again.  She is unable to drink her oral contrast

## 2014-01-14 NOTE — Consult Note (Signed)
Reason for Consult:SBO  Referring Physician: Laurelyn Sickle is an 80 y.o. female.  HPI: Patient presented to the emergency room complaining of crampy abdominal pain. She has small bowel movement yesterday. She has not been passing a lot of gas. She was evaluated in the emergency department. CT scan demonstrates small bowel obstruction, likely due to adhesions in the lower abdomen. She is very reluctant to have surgery considering her age.  Past Medical History  Diagnosis Date  . Hypertension   . Arthritis   . Gout   . H/O hiatal hernia   . GERD (gastroesophageal reflux disease)   . Bronchitis     Past Surgical History  Procedure Laterality Date  . Appendectomy    . Tee without cardioversion N/A 10/13/2013    Procedure: TRANSESOPHAGEAL ECHOCARDIOGRAM (TEE);  Surgeon: Sueanne Margarita, MD;  Location: [facility] ENDOSCOPY;  Service: Cardiovascular;  Laterality: N/A;    Family History  Problem Relation Age of Onset  . Cancer Mother   . Stroke Father     Social History:  reports that she has quit smoking. She quit smokeless tobacco use about 9 years ago. She reports that she does not drink alcohol or use illicit drugs.  Allergies:  Allergies  Allergen Reactions  . Penicillins Rash    Medications: Prior to Admission:  (Not in a hospital admission)  Results for orders placed during the hospital encounter of 01/13/14 (from the past 48 hour(s))  CBC WITH DIFFERENTIAL     Status: None   Collection Time    01/14/14 12:55 AM      Result Value Ref Range   WBC 6.9  4.0 - 10.5 K/uL   RBC 4.35  3.87 - 5.11 MIL/uL   Hemoglobin 13.0  12.0 - 15.0 g/dL   HCT 39.3  36.0 - 46.0 %   MCV 90.3  78.0 - 100.0 fL   MCH 29.9  26.0 - 34.0 pg   MCHC 33.1  30.0 - 36.0 g/dL   RDW 13.8  11.5 - 15.5 %   Platelets 295  150 - 400 K/uL   Neutrophils Relative % 64  43 - 77 %   Neutro Abs 4.4  1.7 - 7.7 K/uL   Lymphocytes Relative 28  12 - 46 %   Lymphs Abs 1.9  0.7 - 4.0 K/uL   Monocytes  Relative 5  3 - 12 %   Monocytes Absolute 0.4  0.1 - 1.0 K/uL   Eosinophils Relative 3  0 - 5 %   Eosinophils Absolute 0.2  0.0 - 0.7 K/uL   Basophils Relative 0  0 - 1 %   Basophils Absolute 0.0  0.0 - 0.1 K/uL  COMPREHENSIVE METABOLIC PANEL     Status: Abnormal   Collection Time    01/14/14 12:55 AM      Result Value Ref Range   Sodium 144  137 - 147 mEq/L   Potassium 3.2 (*) 3.7 - 5.3 mEq/L   Chloride 103  96 - 112 mEq/L   CO2 28  19 - 32 mEq/L   Glucose, Bld 137 (*) 70 - 99 mg/dL   BUN 21  6 - 23 mg/dL   Creatinine, Ser 1.01  0.50 - 1.10 mg/dL   Calcium 10.5  8.4 - 10.5 mg/dL   Total Protein 7.8  6.0 - 8.3 g/dL   Albumin 4.0  3.5 - 5.2 g/dL   AST 24  0 - 37 U/L   ALT 13  0 -  35 U/L   Alkaline Phosphatase 80  39 - 117 U/L   Total Bilirubin 0.4  0.3 - 1.2 mg/dL   GFR calc non Af Amer 48 (*) >90 mL/min   GFR calc Af Amer 55 (*) >90 mL/min   Comment: (NOTE)     The eGFR has been calculated using the CKD EPI equation.     This calculation has not been validated in all clinical situations.     eGFR's persistently <90 mL/min signify possible Chronic Kidney     Disease.  LIPASE, BLOOD     Status: None   Collection Time    01/14/14 12:55 AM      Result Value Ref Range   Lipase 33  11 - 59 U/L  I-STAT TROPOININ, ED     Status: None   Collection Time    01/14/14  1:02 AM      Result Value Ref Range   Troponin i, poc 0.00  0.00 - 0.08 ng/mL   Comment 3            Comment: Due to the release kinetics of cTnI,     a negative result within the first hours     of the onset of symptoms does not rule out     myocardial infarction with certainty.     If myocardial infarction is still suspected,     repeat the test at appropriate intervals.  I-STAT CG4 LACTIC ACID, ED     Status: None   Collection Time    01/14/14  1:16 AM      Result Value Ref Range   Lactic Acid, Venous 0.78  0.5 - 2.2 mmol/L  URINALYSIS, ROUTINE W REFLEX MICROSCOPIC     Status: Abnormal   Collection Time     01/14/14  1:53 AM      Result Value Ref Range   Color, Urine YELLOW  YELLOW   APPearance CLEAR  CLEAR   Specific Gravity, Urine 1.016  1.005 - 1.030   pH 6.5  5.0 - 8.0   Glucose, UA NEGATIVE  NEGATIVE mg/dL   Hgb urine dipstick NEGATIVE  NEGATIVE   Bilirubin Urine NEGATIVE  NEGATIVE   Ketones, ur 15 (*) NEGATIVE mg/dL   Protein, ur NEGATIVE  NEGATIVE mg/dL   Urobilinogen, UA 1.0  0.0 - 1.0 mg/dL   Nitrite NEGATIVE  NEGATIVE   Leukocytes, UA NEGATIVE  NEGATIVE   Comment: MICROSCOPIC NOT DONE ON URINES WITH NEGATIVE PROTEIN, BLOOD, LEUKOCYTES, NITRITE, OR GLUCOSE <1000 mg/dL.    Ct Abdomen Pelvis W Contrast  01/14/2014   CLINICAL DATA:  Abdominal pain, acute onset  EXAM: CT ABDOMEN AND PELVIS WITH CONTRAST  TECHNIQUE: Multidetector CT imaging of the abdomen and pelvis was performed using the standard protocol following bolus administration of intravenous contrast.  CONTRAST:  30m OMNIPAQUE IOHEXOL 300 MG/ML  SOLN  COMPARISON:  None.  FINDINGS: BODY WALL: Unremarkable.  LOWER CHEST: Aortic and mitral annular calcification. Extensive coronary atherosclerosis. Bilateral subpleural reticulation.  ABDOMEN/PELVIS:  Liver: No focal abnormality.  Biliary: Cholelithiasis. There is gallbladder distention and intra and extrahepatic biliary ductal dilatation. No visible choledocholithiasis or other obstructive process.  Pancreas: Unremarkable.  Spleen: Unremarkable.  Adrenals: Unremarkable.  Kidneys and ureters: No hydronephrosis or stone.  Bladder: Unremarkable.  Reproductive: Unremarkable for age.  Bowel: Small bowel obstruction with proximal and mid small bowel loops dilated and fluid-filled. There is an abrupt transition point in the low central abdomen where there is twisting of small bowel.  Transition point is best seen in the coronal projection. There is a small knuckle of bowel in the left groin which likely reflects a developing femoral hernia. There is no proximal obstruction at this level. Extensive  colonic diverticulosis. There is reactive mesenteric edema and small volume ascites. No pneumatosis or free perforation. As permitted by stool and enteric contrast, no nonenhancing segments of bowel. No pericecal inflammation.  Retroperitoneum: No mass or adenopathy.  Peritoneum: No free fluid or gas.  Vascular: No acute abnormality.  OSSEOUS: No acute abnormalities. Diffuse and advanced degenerative disc disease.  IMPRESSION: 1. High-grade distal small bowel obstruction with transition point in the low abdomen where there are changes of adhesion or small bowel volvulus. 2. Cholelithiasis and biliary dilatation. Correlate with liver function tests to evaluate for biliary obstruction. 3. Extensive colonic diverticulosis.   Electronically Signed   By: Jorje Guild M.D.   On: 01/14/2014 05:35   Dg Abd Acute W/chest  01/14/2014   CLINICAL DATA:  80 year old female abdominal pain. Hypertension. Initial encounter.  EXAM: ACUTE ABDOMEN SERIES (ABDOMEN 2 VIEW & CHEST 1 VIEW)  COMPARISON:  Chest radiographs 09/2013 and earlier.  FINDINGS: Portable AP semi upright view of the chest at at 0140 hr. Supine and left-side-down lateral decubitus views of the abdomen.  No pneumoperitoneum. Mildly featureless gas-filled bowel loops in the mid abdomen, but no abnormally dilated loops. Large bowel gas to the level of the splenic flexure and also in the rectum.  Interval cleared right lung base pneumonia. Stable cardiac size and mediastinal contours. Visualized tracheal air column is within normal limits. No pneumothorax or pulmonary edema. Underlying chronic increased interstitial opacity.  Scoliosis. Osteopenia. Aortoiliac calcified atherosclerosis noted. No acute osseous abnormality identified.  IMPRESSION: 1. Mildly featureless bowel loops in the mid abdomen are nonspecific and might relate to bowel inflammation. Non obstructed bowel gas pattern, no free air. 2. Clearance of the right lower lobe pneumonia seen in February. No  acute cardiopulmonary abnormality.   Electronically Signed   By: Lars Pinks M.D.   On: 01/14/2014 01:59    Review of Systems  Constitutional: Negative for fever and chills.  HENT: Negative.   Eyes: Negative.   Respiratory: Negative.   Cardiovascular: Negative.   Gastrointestinal: Positive for nausea and abdominal pain. Negative for diarrhea.  Musculoskeletal: Negative.   Skin: Negative.   Neurological: Negative.   Endo/Heme/Allergies: Negative.   Psychiatric/Behavioral: Negative.    Blood pressure 183/86, pulse 71, temperature 97.9 F (36.6 C), temperature source Oral, resp. rate 15, height 5' 5"  (1.651 m), weight 104 lb (47.174 kg), SpO2 92.00%. Physical Exam  Constitutional: She is oriented to person, place, and time. She appears well-developed. No distress.  HENT:  Head: Normocephalic and atraumatic.  Mouth/Throat: Oropharynx is clear and moist. No oropharyngeal exudate.  Eyes: EOM are normal. Pupils are equal, round, and reactive to light. Right eye exhibits no discharge. Left eye exhibits no discharge. No scleral icterus.  Neck: Normal range of motion. Neck supple.  Cardiovascular: Normal rate.   2/6 systolic ejection murmur  Respiratory: Effort normal and breath sounds normal.  GI: Soft. She exhibits distension. There is tenderness. There is no rebound and no guarding.  Moderate distention with mild tenderness, no guarding, no peritoneal signs, positive bowel sounds  Musculoskeletal: Normal range of motion.  Neurological: She is alert and oriented to person, place, and time.  Skin: Skin is warm and dry.  Psychiatric: She has a normal mood and affect.    Assessment/Plan: Small bowel  obstruction likely due to adhesions. Agree with medical admission.Recommend NG tube, IVF,  and bowel rest. If she does not improve in 24-48 hours, she may need surgery. She is very reluctant to undergo surgery as she is worried about her age. We will follow.  Zenovia Jarred 01/14/2014, 6:45 AM

## 2014-01-14 NOTE — Progress Notes (Signed)
Report received from ED RN for admission to [phone]

## 2014-01-14 NOTE — ED Notes (Signed)
Per Dr. Norlene Campbell, wait to place NG tube at this time until surgery has seen patient.

## 2014-01-14 NOTE — ED Notes (Signed)
Patient finished with contrast. Ct notified.

## 2014-01-14 NOTE — ED Notes (Signed)
Pt just returned from xray.  Groaning with pain in abd

## 2014-01-14 NOTE — ED Provider Notes (Signed)
CSN: [id number]     Arrival date & time 01/13/14  2346 History   First MD Initiated Contact with Patient 01/14/14 0007     Chief Complaint  Patient presents with  . Abdominal Pain  . Chest Pain     (Consider location/radiation/quality/duration/timing/severity/associated sxs/prior Treatment) HPI 80 yo female presents to the ER from home via EMS with complaint of abdominal pain.  Pt is vague on onset, but reports it started just before dark tonight.  She has had nausea but no vomiting.  Initial report from EMS reported chest and abd pain, but patient denies this and reports only lower abdominal pain.  Pain is severe at times, cramping like labor pains.  Normal BM earlier today.  No fevers, chills, sob, back pain.  No dysuria.  Pt s/p appendectomy about 40 years ago.  Pt lives by herself, family checks in on her daily.  PMH of htn, pna, GERD, gout Past Medical History  Diagnosis Date  . Hypertension   . Arthritis   . Gout   . H/O hiatal hernia   . GERD (gastroesophageal reflux disease)   . Bronchitis    Past Surgical History  Procedure Laterality Date  . Appendectomy    . Tee without cardioversion N/A 10/13/2013    Procedure: TRANSESOPHAGEAL ECHOCARDIOGRAM (TEE);  Surgeon: Quintella Reichert, MD;  Location: [facility] ENDOSCOPY;  Service: Cardiovascular;  Laterality: N/A;   Family History  Problem Relation Age of Onset  . Cancer Mother   . Stroke Father    History  Substance Use Topics  . Smoking status: Former Smoker -- 0.50 packs/day for 70 years  . Smokeless tobacco: Former Neurosurgeon    Quit date: 10/13/2004  . Alcohol Use: No   OB History   Grav Para Term Preterm Abortions TAB SAB Ect Mult Living                 Review of Systems  All other systems reviewed and are negative.     Allergies  Penicillins  Home Medications   Prior to Admission medications   Medication Sig Start Date End Date Taking? Authorizing Provider  albuterol (PROVENTIL HFA;VENTOLIN HFA) 108 (90 BASE)  MCG/ACT inhaler Inhale 2 puffs into the lungs every 6 (six) hours as needed for wheezing or shortness of breath.   Yes Historical Provider, MD  allopurinol (ZYLOPRIM) 300 MG tablet Take 450 mg by mouth at bedtime.    Yes Historical Provider, MD  atenolol (TENORMIN) 25 MG tablet Take 25 mg by mouth 2 (two) times daily.   Yes Historical Provider, MD  polyethylene glycol (MIRALAX / GLYCOLAX) packet Take 17 g by mouth daily as needed for mild constipation.   Yes Historical Provider, MD  simvastatin (ZOCOR) 40 MG tablet Take 40 mg by mouth at bedtime.    Yes Historical Provider, MD   BP 164/84  Pulse 67  Temp(Src) 97.9 F (36.6 C) (Oral)  Resp 18  Ht 5\' 5"  (1.651 m)  Wt 103 lb 9.6 oz (46.993 kg)  BMI 17.24 kg/m2  SpO2 95% Physical Exam  Nursing note and vitals reviewed. Constitutional: She is oriented to person, place, and time. She appears well-developed and well-nourished. She appears distressed (uncomfortable appearing).  HENT:  Head: Normocephalic and atraumatic.  Nose: Nose normal.  Mouth/Throat: Oropharynx is clear and moist.  Eyes: Conjunctivae and EOM are normal. Pupils are equal, round, and reactive to light.  Neck: Normal range of motion. Neck supple. No JVD present. No tracheal deviation present. No thyromegaly  present.  Cardiovascular: Normal rate, regular rhythm, normal heart sounds and intact distal pulses.  Exam reveals no gallop and no friction rub.   No murmur heard. Pulmonary/Chest: Effort normal and breath sounds normal. No stridor. No respiratory distress. She has no wheezes. She has no rales. She exhibits no tenderness.  Abdominal: Soft. She exhibits no distension and no mass. There is tenderness (diffuse lower abdominal tenderness without rebound). There is no rebound and no guarding.  Hyperactive bowel sounds  Musculoskeletal: Normal range of motion. She exhibits no edema and no tenderness.  Lymphadenopathy:    She has no cervical adenopathy.  Neurological: She is  alert and oriented to person, place, and time. She exhibits normal muscle tone. Coordination normal.  Skin: Skin is warm and dry. No rash noted. No erythema. No pallor.  Psychiatric: She has a normal mood and affect. Her behavior is normal. Judgment and thought content normal.    ED Course  Procedures (including critical care time) Labs Review Labs Reviewed  COMPREHENSIVE METABOLIC PANEL - Abnormal; Notable for the following:    Potassium 3.2 (*)    Glucose, Bld 137 (*)    GFR calc non Af Amer 48 (*)    GFR calc Af Amer 55 (*)    All other components within normal limits  URINALYSIS, ROUTINE W REFLEX MICROSCOPIC - Abnormal; Notable for the following:    Ketones, ur 15 (*)    All other components within normal limits  CBC WITH DIFFERENTIAL  LIPASE, BLOOD  BASIC METABOLIC PANEL  CBC  MAGNESIUM  I-STAT TROPOININ, ED  I-STAT CG4 LACTIC ACID, ED    Imaging Review Ct Abdomen Pelvis W Contrast  01/14/2014   CLINICAL DATA:  Abdominal pain, acute onset  EXAM: CT ABDOMEN AND PELVIS WITH CONTRAST  TECHNIQUE: Multidetector CT imaging of the abdomen and pelvis was performed using the standard protocol following bolus administration of intravenous contrast.  CONTRAST:  80mL OMNIPAQUE IOHEXOL 300 MG/ML  SOLN  COMPARISON:  None.  FINDINGS: BODY WALL: Unremarkable.  LOWER CHEST: Aortic and mitral annular calcification. Extensive coronary atherosclerosis. Bilateral subpleural reticulation.  ABDOMEN/PELVIS:  Liver: No focal abnormality.  Biliary: Cholelithiasis. There is gallbladder distention and intra and extrahepatic biliary ductal dilatation. No visible choledocholithiasis or other obstructive process.  Pancreas: Unremarkable.  Spleen: Unremarkable.  Adrenals: Unremarkable.  Kidneys and ureters: No hydronephrosis or stone.  Bladder: Unremarkable.  Reproductive: Unremarkable for age.  Bowel: Small bowel obstruction with proximal and mid small bowel loops dilated and fluid-filled. There is an abrupt  transition point in the low central abdomen where there is twisting of small bowel. Transition point is best seen in the coronal projection. There is a small knuckle of bowel in the left groin which likely reflects a developing femoral hernia. There is no proximal obstruction at this level. Extensive colonic diverticulosis. There is reactive mesenteric edema and small volume ascites. No pneumatosis or free perforation. As permitted by stool and enteric contrast, no nonenhancing segments of bowel. No pericecal inflammation.  Retroperitoneum: No mass or adenopathy.  Peritoneum: No free fluid or gas.  Vascular: No acute abnormality.  OSSEOUS: No acute abnormalities. Diffuse and advanced degenerative disc disease.  IMPRESSION: 1. High-grade distal small bowel obstruction with transition point in the low abdomen where there are changes of adhesion or small bowel volvulus. 2. Cholelithiasis and biliary dilatation. Correlate with liver function tests to evaluate for biliary obstruction. 3. Extensive colonic diverticulosis.   Electronically Signed   By: Audry Riles.D.  On: 01/14/2014 05:35   Dg Abd Acute W/chest  01/14/2014   CLINICAL DATA:  80 year old female abdominal pain. Hypertension. Initial encounter.  EXAM: ACUTE ABDOMEN SERIES (ABDOMEN 2 VIEW & CHEST 1 VIEW)  COMPARISON:  Chest radiographs 09/2013 and earlier.  FINDINGS: Portable AP semi upright view of the chest at at 0140 hr. Supine and left-side-down lateral decubitus views of the abdomen.  No pneumoperitoneum. Mildly featureless gas-filled bowel loops in the mid abdomen, but no abnormally dilated loops. Large bowel gas to the level of the splenic flexure and also in the rectum.  Interval cleared right lung base pneumonia. Stable cardiac size and mediastinal contours. Visualized tracheal air column is within normal limits. No pneumothorax or pulmonary edema. Underlying chronic increased interstitial opacity.  Scoliosis. Osteopenia. Aortoiliac calcified  atherosclerosis noted. No acute osseous abnormality identified.  IMPRESSION: 1. Mildly featureless bowel loops in the mid abdomen are nonspecific and might relate to bowel inflammation. Non obstructed bowel gas pattern, no free air. 2. Clearance of the right lower lobe pneumonia seen in February. No acute cardiopulmonary abnormality.   Electronically Signed   By: Augusto GambleLee  Hall M.D.   On: 01/14/2014 01:59   Dg Abd Portable 1v  01/14/2014   CLINICAL DATA:  NG tube placement  EXAM: PORTABLE ABDOMEN - 1 VIEW  COMPARISON:  01/14/2014  FINDINGS: NG tube has been advanced and projects over the stomach. Distal port is within the stomach.  IMPRESSION: NG tube in the stomach.   Electronically Signed   By: Esperanza Heiraymond  Rubner M.D.   On: 01/14/2014 20:40   Dg Abd Portable 1v  01/14/2014   CLINICAL DATA:  80 year old female NG tube placement.  EXAM: PORTABLE ABDOMEN - 1 VIEW  COMPARISON:  CT Abdomen and Pelvis 01/14/2014.  FINDINGS: Portable AP supine view at 1852 hrs. Enteric tube placed, side hole at the level of the mid to lower mediastinum. Tip of the tube is near the level of the diaphragm. Stable bowel gas pattern. Stable lung bases.  IMPRESSION: 1. Enteric tube placed, but has not yet reached the stomach. Advance 10 cm to place the side hole within the stomach. 2. Stable bowel gas pattern.   Electronically Signed   By: Augusto GambleLee  Hall M.D.   On: 01/14/2014 19:06     EKG Interpretation   Date/Time:  Saturday Jan 14 2014 00:00:28 EDT Ventricular Rate:  65 PR Interval:  173 QRS Duration: 79 QT Interval:  377 QTC Calculation: 392 R Axis:   18 Text Interpretation:  Sinus rhythm Probable left atrial enlargement  Probable anteroseptal infarct, old Borderline T abnormalities, inferior  leads Confirmed by Nerine Pulse  MD, Manaia Samad ([id number]) on 01/14/2014 2:28:53 AM      MDM   Final diagnoses:  Small bowel obstruction    80 yo female with severe lower abdominal pain.  Labs normal.  Acute abd series unremarkable.  Pt still in  pain, will proceed with ct abd pelvis.  CT scan shows high grade small bowel obstruction.  Pt actually feeling better, no further n/v and pain controlled.  Results d/w patient.  Surgery consulted, will see patient.  They request hospitalist admission given age.    Olivia Mackielga M Paije Goodhart, MD 01/14/14 2138

## 2014-01-14 NOTE — ED Notes (Signed)
Oral contrast given for the pt to drink over 2 hours

## 2014-01-14 NOTE — ED Notes (Signed)
The pts pain is no better.  More pain and nausea med given

## 2014-01-14 NOTE — Progress Notes (Signed)
[facility] [id number] Code Status: FULL   Admission Data: 01/14/2014 9:14 AM Attending Provider:  Tat Talmage Nap, MD Consults/ Treatment Team:  [facility]   [name] is a 80 y.o. female patient admitted from ED awake, alert - oriented  X 3 - no acute distress noted.  VSS - Blood pressure 172/84, pulse 83, temperature 98.9 F (37.2 C), temperature source Oral, resp. rate 18, height 5\' 5"  (1.651 m), weight 46.993 kg (103 lb 9.6 oz), SpO2 98.00%.  Medication orders for BP.  IV in place, occlusive dsg intact without redness.  Orientation to room, and floor completed with information packet given to patient/family.  Patient declined safety video at this time, education provided about room safety.  Admission INP armband ID verified with patient/family, and in place.   SR up x 2, fall assessment complete, with patient and family able to verbalize understanding of risk associated with falls, and verbalized understanding to call nsg before up out of bed.  Call light within reach, patient able to voice, and demonstrate understanding.  Skin, clean-dry- intact without evidence of bruising, or skin tears.   No evidence of skin break down noted on exam.     Will cont to eval and treat per MD orders.  Aldean Ast, RN 01/14/2014 9:14 AM

## 2014-01-14 NOTE — H&P (Signed)
History and Physical  [name]:[id number] DOB: 11/08/1922 DOA: 01/13/2014   PCP: Pearson Grippe, MD   Chief Complaint: Abdominal pain  HPI:  80 year old female with history of hypertension, gouty arthritis, and COPD presents with one-day history of worsening abdominal pain. The patient started having abdominal pain on 01/13/2014 which progressively worsened. This prompted her to come to the emergency department for further evaluation. The patient denied any fevers, chills, chest pain, shortness of breath, nausea, vomiting, diarrhea, hematochezia, melena, dysuria, hematuria. She states that she often struggles with constipation. CT of the abdomen and pelvis in the emergency department revealed a high-grade small bowel obstruction with transition point in the lower abdomen where there are concerns of possible adhesions and small bowel volvulus. General surgery was consulted. NG tube was placed to suction. They requested medical admission as the patient did not need emergent surgery.  Interestingly, the patient states she had a small BM yesterday.  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 The patient finished 28 days of intravenous ceftriaxone on 11/05/2013.  In the emergency department, the patient was hemodynamically stable. CBC and BMP were essentially unremarkable except for potassium 3.2. Hepatic panel was unremarkable. Calcium was mildly elevated at 10.5. EKG showed sinus rhythm with nonspecific T-wave changes. Urinalysis was negative for pyuria. Assessment/Plan: Small bowel obstruction -Medical management for now -Gen. surgery has been consulted and is following -NG tube to suction -IV fluids -Optimize electrolytes -IV opioids when necessary pain Hypokalemia -Replete -Check magnesium Hypercalcemia--mild -Likely due to volume depletion -IV  fluids -Continue to monitor COPD -Patient with greater than 60-pack-year history -Quit tobacco approximately 9 years ago -Aerosolized albuterol and Atrovent -Stable at this time without exacerbation Hypertension -Discontinue atenolol -Convert to IV metoprolol for now until the patient is able to take po Severe protein calorie malnutrition -Nutritional supplementation once she is able to take by mouth       Past Medical History  Diagnosis Date  . Hypertension   . Arthritis   . Gout   . H/O hiatal hernia   . GERD (gastroesophageal reflux disease)   . Bronchitis    Past Surgical History  Procedure Laterality Date  . Appendectomy    . Tee without cardioversion N/A 10/13/2013    Procedure: TRANSESOPHAGEAL ECHOCARDIOGRAM (TEE);  Surgeon: Quintella Reichert, MD;  Location: [facility] ENDOSCOPY;  Service: Cardiovascular;  Laterality: N/A;   Social History:  reports that she has quit smoking. She quit smokeless tobacco use about 9 years ago. She reports that she does not drink alcohol or use illicit drugs.   Family History  Problem Relation Age of Onset  . Cancer Mother   . Stroke Father      Allergies  Allergen Reactions  . Penicillins Rash      Prior to Admission medications   Medication Sig Start Date End Date Taking? Authorizing Provider  albuterol (PROVENTIL HFA;VENTOLIN HFA) 108 (90 BASE) MCG/ACT inhaler Inhale 2 puffs into the lungs every 6 (six) hours as needed for wheezing or shortness of breath.   Yes Historical Provider, MD  allopurinol (ZYLOPRIM) 300 MG tablet Take 450 mg by mouth at bedtime.    Yes Historical Provider, MD  atenolol (TENORMIN) 25 MG tablet Take 25 mg by mouth 2 (two) times daily.   Yes Historical Provider, MD  polyethylene glycol (MIRALAX / GLYCOLAX) packet Take 17 g by mouth daily as needed for  mild constipation.   Yes Historical Provider, MD  simvastatin (ZOCOR) 40 MG tablet Take 40 mg by mouth at bedtime.    Yes Historical Provider, MD    Review of  Systems:  Constitutional:  No weight loss, night sweats, Fevers, chills, fatigue.  Head&Eyes: No headache.  No vision loss.  No eye pain or scotoma ENT:  No Difficulty swallowing,Tooth/dental problems,Sore throat,  No ear ache, post nasal drip,  Cardio-vascular:  No chest pain, Orthopnea, PND, swelling in lower extremities,  dizziness, palpitations  GI:  No nausea, vomiting, diarrhea, loss of appetite, hematochezia, melena, heartburn, indigestion, Resp:  No shortness of breath with exertion or at rest. No cough. No coughing up of blood .No wheezing.No chest wall deformity  Skin:  no rash or lesions.  GU:  no dysuria, change in color of urine, no urgency or frequency. No flank pain.  Musculoskeletal:  No joint pain or swelling. No decreased range of motion. No back pain.  Psych:  No change in mood or affect. No depression or anxiety. Neurologic: No headache, no dysesthesia, no focal weakness, no vision loss. No syncope  Physical Exam: Filed Vitals:   01/14/14 0415 01/14/14 0500 01/14/14 0530 01/14/14 0615  BP: 198/82 158/114 183/86 177/90  Pulse: 60 70 71 81  Temp:      TempSrc:      Resp: 15 15 15 15   Height:      Weight:      SpO2: 90% 92% 92% 98%   General:  Alert and awake, NAD, nontoxic, pleasant/cooperative Head/Eye: No conjunctival hemorrhage, no icterus, [place]/AT, No nystagmus ENT:  No icterus,  No thrush,  no pharyngeal exudate Neck:  No masses, no lymphadenpathy, no bruits CV:  RRR, no rub, no gallop, no S3 Lung:  CTAB, good air movement, no wheeze, no rhonchi Abdomen: soft/NT, +BS, nondistended, no peritoneal signs Ext: No cyanosis, No rashes, No petechiae, No lymphangitis, No edema   Labs on Admission:  Basic Metabolic Panel:  Recent Labs Lab 01/14/14 0055  NA 144  K 3.2*  CL 103  CO2 28  GLUCOSE 137*  BUN 21  CREATININE 1.01  CALCIUM 10.5   Liver Function Tests:  Recent Labs Lab 01/14/14 0055  AST 24  ALT 13  ALKPHOS 80  BILITOT 0.4    PROT 7.8  ALBUMIN 4.0    Recent Labs Lab 01/14/14 0055  LIPASE 33   No results found for this basename: AMMONIA,  in the last 168 hours CBC:  Recent Labs Lab 01/14/14 0055  WBC 6.9  NEUTROABS 4.4  HGB 13.0  HCT 39.3  MCV 90.3  PLT 295   Cardiac Enzymes: No results found for this basename: CKTOTAL, CKMB, CKMBINDEX, TROPONINI,  in the last 168 hours BNP: No components found with this basename: POCBNP,  CBG: No results found for this basename: GLUCAP,  in the last 168 hours  Radiological Exams on Admission: Ct Abdomen Pelvis W Contrast  01/14/2014   CLINICAL DATA:  Abdominal pain, acute onset  EXAM: CT ABDOMEN AND PELVIS WITH CONTRAST  TECHNIQUE: Multidetector CT imaging of the abdomen and pelvis was performed using the standard protocol following bolus administration of intravenous contrast.  CONTRAST:  80mL OMNIPAQUE IOHEXOL 300 MG/ML  SOLN  COMPARISON:  None.  FINDINGS: BODY WALL: Unremarkable.  LOWER CHEST: Aortic and mitral annular calcification. Extensive coronary atherosclerosis. Bilateral subpleural reticulation.  ABDOMEN/PELVIS:  Liver: No focal abnormality.  Biliary: Cholelithiasis. There is gallbladder distention and intra and extrahepatic biliary ductal dilatation. No  visible choledocholithiasis or other obstructive process.  Pancreas: Unremarkable.  Spleen: Unremarkable.  Adrenals: Unremarkable.  Kidneys and ureters: No hydronephrosis or stone.  Bladder: Unremarkable.  Reproductive: Unremarkable for age.  Bowel: Small bowel obstruction with proximal and mid small bowel loops dilated and fluid-filled. There is an abrupt transition point in the low central abdomen where there is twisting of small bowel. Transition point is best seen in the coronal projection. There is a small knuckle of bowel in the left groin which likely reflects a developing femoral hernia. There is no proximal obstruction at this level. Extensive colonic diverticulosis. There is reactive mesenteric edema  and small volume ascites. No pneumatosis or free perforation. As permitted by stool and enteric contrast, no nonenhancing segments of bowel. No pericecal inflammation.  Retroperitoneum: No mass or adenopathy.  Peritoneum: No free fluid or gas.  Vascular: No acute abnormality.  OSSEOUS: No acute abnormalities. Diffuse and advanced degenerative disc disease.  IMPRESSION: 1. High-grade distal small bowel obstruction with transition point in the low abdomen where there are changes of adhesion or small bowel volvulus. 2. Cholelithiasis and biliary dilatation. Correlate with liver function tests to evaluate for biliary obstruction. 3. Extensive colonic diverticulosis.   Electronically Signed   By: Tiburcio Pea M.D.   On: 01/14/2014 05:35   Dg Abd Acute W/chest  01/14/2014   CLINICAL DATA:  80 year old female abdominal pain. Hypertension. Initial encounter.  EXAM: ACUTE ABDOMEN SERIES (ABDOMEN 2 VIEW & CHEST 1 VIEW)  COMPARISON:  Chest radiographs 09/2013 and earlier.  FINDINGS: Portable AP semi upright view of the chest at at 0140 hr. Supine and left-side-down lateral decubitus views of the abdomen.  No pneumoperitoneum. Mildly featureless gas-filled bowel loops in the mid abdomen, but no abnormally dilated loops. Large bowel gas to the level of the splenic flexure and also in the rectum.  Interval cleared right lung base pneumonia. Stable cardiac size and mediastinal contours. Visualized tracheal air column is within normal limits. No pneumothorax or pulmonary edema. Underlying chronic increased interstitial opacity.  Scoliosis. Osteopenia. Aortoiliac calcified atherosclerosis noted. No acute osseous abnormality identified.  IMPRESSION: 1. Mildly featureless bowel loops in the mid abdomen are nonspecific and might relate to bowel inflammation. Non obstructed bowel gas pattern, no free air. 2. Clearance of the right lower lobe pneumonia seen in February. No acute cardiopulmonary abnormality.   Electronically Signed    By: Augusto Gamble M.D.   On: 01/14/2014 01:59    EKG: Independently reviewed. Sinus rhythm, nonspecific T wave changes--III, aVF    Time spent:60 minutes Code Status:   FULL Family Communication:   Family at bedside   Catarina Hartshorn, DO  [facility] Pager [phone]  If 7PM-7AM, please contact night-coverage [email or web address] Password [email or web address] 01/14/2014, 8:03 AM

## 2014-01-15 DIAGNOSIS — E43 Unspecified severe protein-calorie malnutrition: Secondary | ICD-10-CM

## 2014-01-15 DIAGNOSIS — K56609 Unspecified intestinal obstruction, unspecified as to partial versus complete obstruction: Secondary | ICD-10-CM

## 2014-01-15 DIAGNOSIS — I10 Essential (primary) hypertension: Secondary | ICD-10-CM

## 2014-01-15 LAB — BASIC METABOLIC PANEL
BUN: 17 mg/dL (ref 6–23)
CALCIUM: 9.3 mg/dL (ref 8.4–10.5)
CO2: 25 mEq/L (ref 19–32)
Chloride: 104 mEq/L (ref 96–112)
Creatinine, Ser: 0.86 mg/dL (ref 0.50–1.10)
GFR calc Af Amer: 67 mL/min — ABNORMAL LOW (ref >90)
GFR, EST NON AFRICAN AMERICAN: 58 mL/min — AB (ref >90)
Glucose, Bld: 84 mg/dL (ref 70–99)
Potassium: 4.3 mEq/L (ref 3.7–5.3)
Sodium: 139 mEq/L (ref 137–147)

## 2014-01-15 LAB — MAGNESIUM: Magnesium: 2.3 mg/dL (ref 1.5–2.5)

## 2014-01-15 LAB — CBC
HCT: 35.9 % — ABNORMAL LOW (ref 36.0–46.0)
HEMOGLOBIN: 11.8 g/dL — AB (ref 12.0–15.0)
MCH: 29.7 pg (ref 26.0–34.0)
MCHC: 32.9 g/dL (ref 30.0–36.0)
MCV: 90.4 fL (ref 78.0–100.0)
PLATELETS: 256 10*3/uL (ref 150–400)
RBC: 3.97 MIL/uL (ref 3.87–5.11)
RDW: 13.8 % (ref 11.5–15.5)
WBC: 6.7 10*3/uL (ref 4.0–10.5)

## 2014-01-15 MED ORDER — METOPROLOL TARTRATE 1 MG/ML IV SOLN
2.5000 mg | Freq: Four times a day (QID) | INTRAVENOUS | Status: DC
  Administered 2014-01-16 (×3): 2.5 mg via INTRAVENOUS
  Filled 2014-01-15 (×4): qty 5

## 2014-01-15 MED ORDER — BLISTEX MEDICATED EX OINT
TOPICAL_OINTMENT | CUTANEOUS | Status: DC | PRN
  Filled 2014-01-15: qty 10

## 2014-01-15 MED ORDER — LIP MEDEX EX OINT: 1.0000 "application " | TOPICAL_OINTMENT | Freq: Two times a day (BID) | CUTANEOUS | Status: DC

## 2014-01-15 MED ORDER — MENTHOL 3 MG MT LOZG
1.0000 | LOZENGE | OROMUCOSAL | Status: DC | PRN
  Filled 2014-01-15: qty 9

## 2014-01-15 MED ORDER — MAGIC MOUTHWASH
15.0000 mL | Freq: Four times a day (QID) | ORAL | Status: DC | PRN
  Filled 2014-01-15: qty 15

## 2014-01-15 MED ORDER — HYDRALAZINE HCL 20 MG/ML IJ SOLN
10.0000 mg | Freq: Four times a day (QID) | INTRAMUSCULAR | Status: DC | PRN
  Administered 2014-01-15 – 2014-01-18 (×4): 10 mg via INTRAVENOUS
  Filled 2014-01-15 (×4): qty 1

## 2014-01-15 MED ORDER — LACTATED RINGERS IV BOLUS (SEPSIS): 1000.0000 mL | Freq: Three times a day (TID) | INTRAVENOUS | Status: AC | PRN

## 2014-01-15 MED ORDER — WHITE PETROLATUM GEL
Status: AC
  Administered 2014-01-15: 0.2
  Filled 2014-01-15: qty 5

## 2014-01-15 MED ORDER — ACETAMINOPHEN 650 MG RE SUPP: 650.0000 mg | Freq: Four times a day (QID) | RECTAL | Status: DC | PRN

## 2014-01-15 MED ORDER — BISACODYL 10 MG RE SUPP
10.0000 mg | Freq: Every day | RECTAL | Status: DC
  Administered 2014-01-15 – 2014-01-16 (×2): 10 mg via RECTAL
  Filled 2014-01-15 (×3): qty 1

## 2014-01-15 MED ORDER — BISACODYL 10 MG RE SUPP: 10.0000 mg | Freq: Two times a day (BID) | RECTAL | Status: DC | PRN

## 2014-01-15 MED ORDER — PHENOL 1.4 % MT LIQD
2.0000 | OROMUCOSAL | Status: DC | PRN
  Filled 2014-01-15: qty 177

## 2014-01-15 MED ORDER — HYDRALAZINE HCL 20 MG/ML IJ SOLN
5.0000 mg | Freq: Four times a day (QID) | INTRAMUSCULAR | Status: DC | PRN
  Administered 2014-01-15: 5 mg via INTRAVENOUS
  Filled 2014-01-15: qty 1

## 2014-01-15 NOTE — Progress Notes (Signed)
INITIAL NUTRITION ASSESSMENT  DOCUMENTATION CODES Per approved criteria  -Severe malnutrition in the context of chronic illness -underweight   INTERVENTION:  Monitor diet advancement  Add Resource Breeze tid when diet advanced.  RD to follow  NUTRITION DIAGNOSIS: Inadequate oral intake related to altered gi function as evidenced by npo status..   Goal: Tolerate diet advancement with intake of meals and supplements to meet >90% estimated needs.  Monitor:  Diet advancement, labs, weight status.  Reason for Assessment: MST  80 y.o. female  Admitting Dx: <principal problem not specified>  ASSESSMENT: Patient admitted with SBO.  NGT in place.  NPO.    -Dx with severe malnutrition related to chronic illness when previously assessed by inpatient RD in October 2014 and February of 2015.    -"ate all the time" prior to admit.  Ate small amounts throughout the day. -Weight has decreased from previous hospitalizations and patient was unable to regain.   . Patient meets criteria for severe malnutrition in the context of chronic illness AEB severe muscle and fat mass loss.  Nutrition Focused Physical Exam:  Subcutaneous Fat:  Orbital Region: moderate Upper Arm Region: severe Thoracic and Lumbar Region: severe  Muscle:  Temple Region: severe Clavicle Bone Region: severe Clavicle and Acromion Bone Region: severe Scapular Bone Region: severe Dorsal Hand: severe Patellar Region: severe Anterior Thigh Region: severe Posterior Calf Region: severe  Edema: none    Height: Ht Readings from Last 1 Encounters:  01/14/14 5\' 5"  (1.651 m)    Weight: Wt Readings from Last 1 Encounters:  01/14/14 103 lb 9.6 oz (46.993 kg)    Ideal Body Weight: 125 lbs  % Ideal Body Weight: 82  Wt Readings from Last 10 Encounters:  01/14/14 103 lb 9.6 oz (46.993 kg)  11/01/13 104 lb (47.174 kg)  10/11/13 110 lb 7.2 oz (50.1 kg)  10/11/13 110 lb 7.2 oz (50.1 kg)  06/14/13 103 lb 6.3  oz (46.9 kg)  12/02/12 107 lb 2.3 oz (48.6 kg)    Usual Body Weight: 110 lbs 3 months ago.  % Usual Body Weight: 96  BMI:  Body mass index is 17.24 kg/(m^2).  Estimated Nutritional Needs: Kcal: 1300-1400 Protein: 55-70 gm  Fluid: 1.4-1.6L daily  Skin: intact  Diet Order: NPO  EDUCATION NEEDS: -No education needs identified at this time   Intake/Output Summary (Last 24 hours) at 01/15/14 1640 Last data filed at 01/15/14 1623  Gross per 24 hour  Intake   2065 ml  Output    500 ml  Net   1565 ml    Last BM: 5/29   Labs:   Recent Labs Lab 01/14/14 0055 01/15/14 0645  NA 144 139  K 3.2* 4.3  CL 103 104  CO2 28 25  BUN 21 17  CREATININE 1.01 0.86  CALCIUM 10.5 9.3  MG  --  2.3  GLUCOSE 137* 84    CBG (last 3)  No results found for this basename: GLUCAP,  in the last 72 hours  Scheduled Meds: . antiseptic oral rinse  15 mL Mouth Rinse q12n4p  . bisacodyl  10 mg Rectal Daily  . chlorhexidine  15 mL Mouth Rinse BID  . heparin  5,000 Units Subcutaneous 3 times per day  . metoprolol  2.5 mg Intravenous 4 times per day    Continuous Infusions: . sodium chloride 0.9 % 1,000 mL with potassium chloride 20 mEq infusion 75 mL/hr at 01/15/14 0350    Past Medical History  Diagnosis Date  .  Hypertension   . Arthritis   . Gout   . H/O hiatal hernia   . GERD (gastroesophageal reflux disease)   . Bronchitis     Past Surgical History  Procedure Laterality Date  . Appendectomy    . Tee without cardioversion N/A 10/13/2013    Procedure: TRANSESOPHAGEAL ECHOCARDIOGRAM (TEE);  Surgeon: Quintella Reichertraci R Turner, MD;  [facility];  Service: Cardiovascular;  Laterality: N/A;    Oran ReinLaura Ciella Obi, RD, LDN Clinical Inpatient Dietitian Pager:  [phone] Weekend and after hours pager:  [phone]

## 2014-01-15 NOTE — Progress Notes (Addendum)
PATIENT DETAILS Name: [name] Age: 80 y.o. Sex: female Date of Birth: 10/28/1922 Admit Date: 01/13/2014 Admitting Physician Haydee Monicaachal A David, [name] PCP:[name], Lindsay FearingJAMES, [name]  Subjective: No BM-minimal flatus this am, pain is less than on admission  Assessment/Plan: Active Problems:   SBO (small bowel obstruction) -admitted-supportive care with IVF, NGT and NPO. CCS following. Not much improvement overnight -suspect secondary to adhesions -repeat Abd xray in am   Hypercalcemia -resolved with hydrations-suspect mild hypercalcemia was secondary to dehydration  HTN -BP fluctuating-since NPO Atenolol on hold-c/w IV metoprolol 2.5 mg QID  Dyslipidemia -resume Statins once diet resumes-currently NPO  Hx of recent Haemophilus influenza bacteremia  -The patient finished 28 days of intravenous ceftriaxone on 11/05/2013.  Protein-calorie malnutrition, severe  -start supplements when diet resumed  Disposition: Remain inpatient  DVT Prophylaxis: Prophylactic Heparin  Code Status: Full code   Family Communication None at bedside  Procedures:  None  CONSULTS:  general surgery  Time spent 40 minutes-which includes 50% of the time with face-to-face with patient/ family and coordinating care related to the above assessment and plan.    MEDICATIONS: Scheduled Meds: . antiseptic oral rinse  15 mL Mouth Rinse q12n4p  . bisacodyl  10 mg Rectal Daily  . chlorhexidine  15 mL Mouth Rinse BID  . heparin  5,000 Units Subcutaneous 3 times per day  . metoprolol  2.5 mg Intravenous 4 times per day   Continuous Infusions: . sodium chloride 0.9 % 1,000 mL with potassium chloride 20 mEq infusion 75 mL/hr at 01/15/14 0350   PRN Meds:.acetaminophen, hydrALAZINE, ipratropium-albuterol, lactated ringers, lip balm, magic mouthwash, menthol-cetylpyridinium, morphine injection, ondansetron (ZOFRAN) IV, ondansetron, phenol  Antibiotics: Anti-infectives   None       PHYSICAL  EXAM: Vital signs in last 24 hours: Filed Vitals:   01/14/14 2121 01/15/14 0019 01/15/14 0217 01/15/14 0443  BP: 176/88 179/83 164/77 125/61  Pulse: 66 71 74 80  Temp: 98.6 F (37 C) 98.2 F (36.8 C)  98.5 F (36.9 C)  TempSrc: Oral Oral  Oral  Resp: 18 20  18   Height:      Weight:      SpO2: 92% 93%  94%    Weight change: -0.181 kg (-6.4 oz) Filed Weights   01/13/14 2359 01/14/14 0814  Weight: 47.174 kg (104 lb) 46.993 kg (103 lb 9.6 oz)   Body mass index is 17.24 kg/(m^2).   Gen Exam: Awake and alert with clear speech.   Neck: Supple, No JVD.   Chest: B/L Clear.   CVS: S1 S2 Regular, no murmurs.  Abdomen: soft, BS sluggish, non tender, non distended.  Extremities: no edema, lower extremities warm to touch. Neurologic: Non Focal.   Skin: No Rash.   Wounds: N/A.   Intake/Output from previous day:  Intake/Output Summary (Last 24 hours) at 01/15/14 1215 Last data filed at 01/15/14 0541  Gross per 24 hour  Intake 1326.25 ml  Output     50 ml  Net 1276.25 ml     LAB RESULTS: CBC  Recent Labs Lab 01/14/14 0055 01/15/14 0645  WBC 6.9 6.7  HGB 13.0 11.8*  HCT 39.3 35.9*  PLT 295 256  MCV 90.3 90.4  MCH 29.9 29.7  MCHC 33.1 32.9  RDW 13.8 13.8  LYMPHSABS 1.9  --   MONOABS 0.4  --   EOSABS 0.2  --   BASOSABS 0.0  --     Chemistries   Recent Labs Lab  01/14/14 0055 01/15/14 0645  NA 144 139  K 3.2* 4.3  CL 103 104  CO2 28 25  GLUCOSE 137* 84  BUN 21 17  CREATININE 1.01 0.86  CALCIUM 10.5 9.3  MG  --  2.3    CBG: No results found for this basename: GLUCAP,  in the last 168 hours  GFR Estimated Creatinine Clearance: 32.9 ml/min (by C-G formula based on Cr of 0.86).  Coagulation profile No results found for this basename: INR, PROTIME,  in the last 168 hours  Cardiac Enzymes No results found for this basename: CK, CKMB, TROPONINI, MYOGLOBIN,  in the last 168 hours  No components found with this basename: POCBNP,  No results found for  this basename: DDIMER,  in the last 72 hours No results found for this basename: HGBA1C,  in the last 72 hours No results found for this basename: CHOL, HDL, LDLCALC, TRIG, CHOLHDL, LDLDIRECT,  in the last 72 hours No results found for this basename: TSH, T4TOTAL, FREET3, T3FREE, THYROIDAB,  in the last 72 hours No results found for this basename: VITAMINB12, FOLATE, FERRITIN, TIBC, IRON, RETICCTPCT,  in the last 72 hours  Recent Labs  01/14/14 0055  LIPASE 33    Urine Studies No results found for this basename: UACOL, UAPR, USPG, UPH, UTP, UGL, UKET, UBIL, UHGB, UNIT, UROB, ULEU, UEPI, UWBC, URBC, UBAC, CAST, CRYS, UCOM, BILUA,  in the last 72 hours  MICROBIOLOGY: No results found for this or any previous visit (from the past 240 hour(s)).  RADIOLOGY STUDIES/RESULTS: Ct Abdomen Pelvis W Contrast  01/14/2014   CLINICAL DATA:  Abdominal pain, acute onset  EXAM: CT ABDOMEN AND PELVIS WITH CONTRAST  TECHNIQUE: Multidetector CT imaging of the abdomen and pelvis was performed using the standard protocol following bolus administration of intravenous contrast.  CONTRAST:  80mL OMNIPAQUE IOHEXOL 300 MG/ML  SOLN  COMPARISON:  None.  FINDINGS: BODY WALL: Unremarkable.  LOWER CHEST: Aortic and mitral annular calcification. Extensive coronary atherosclerosis. Bilateral subpleural reticulation.  ABDOMEN/PELVIS:  Liver: No focal abnormality.  Biliary: Cholelithiasis. There is gallbladder distention and intra and extrahepatic biliary ductal dilatation. No visible choledocholithiasis or other obstructive process.  Pancreas: Unremarkable.  Spleen: Unremarkable.  Adrenals: Unremarkable.  Kidneys and ureters: No hydronephrosis or stone.  Bladder: Unremarkable.  Reproductive: Unremarkable for age.  Bowel: Small bowel obstruction with proximal and mid small bowel loops dilated and fluid-filled. There is an abrupt transition point in the low central abdomen where there is twisting of small bowel. Transition point is  best seen in the coronal projection. There is a small knuckle of bowel in the left groin which likely reflects a developing femoral hernia. There is no proximal obstruction at this level. Extensive colonic diverticulosis. There is reactive mesenteric edema and small volume ascites. No pneumatosis or free perforation. As permitted by stool and enteric contrast, no nonenhancing segments of bowel. No pericecal inflammation.  Retroperitoneum: No mass or adenopathy.  Peritoneum: No free fluid or gas.  Vascular: No acute abnormality.  OSSEOUS: No acute abnormalities. Diffuse and advanced degenerative disc disease.  IMPRESSION: 1. High-grade distal small bowel obstruction with transition point in the low abdomen where there are changes of adhesion or small bowel volvulus. 2. Cholelithiasis and biliary dilatation. Correlate with liver function tests to evaluate for biliary obstruction. 3. Extensive colonic diverticulosis.   Electronically Signed   By: Tiburcio Pea M.D.   On: 01/14/2014 05:35   Dg Abd Acute W/chest  01/14/2014   CLINICAL DATA:  80 year old  female abdominal pain. Hypertension. Initial encounter.  EXAM: ACUTE ABDOMEN SERIES (ABDOMEN 2 VIEW & CHEST 1 VIEW)  COMPARISON:  Chest radiographs 09/2013 and earlier.  FINDINGS: Portable AP semi upright view of the chest at at 0140 hr. Supine and left-side-down lateral decubitus views of the abdomen.  No pneumoperitoneum. Mildly featureless gas-filled bowel loops in the mid abdomen, but no abnormally dilated loops. Large bowel gas to the level of the splenic flexure and also in the rectum.  Interval cleared right lung base pneumonia. Stable cardiac size and mediastinal contours. Visualized tracheal air column is within normal limits. No pneumothorax or pulmonary edema. Underlying chronic increased interstitial opacity.  Scoliosis. Osteopenia. Aortoiliac calcified atherosclerosis noted. No acute osseous abnormality identified.  IMPRESSION: 1. Mildly featureless  bowel loops in the mid abdomen are nonspecific and might relate to bowel inflammation. Non obstructed bowel gas pattern, no free air. 2. Clearance of the right lower lobe pneumonia seen in February. No acute cardiopulmonary abnormality.   Electronically Signed   By: Augusto Gamble M.D.   On: 01/14/2014 01:59   Dg Abd Portable 1v  01/14/2014   CLINICAL DATA:  NG tube placement  EXAM: PORTABLE ABDOMEN - 1 VIEW  COMPARISON:  01/14/2014  FINDINGS: NG tube has been advanced and projects over the stomach. Distal port is within the stomach.  IMPRESSION: NG tube in the stomach.   Electronically Signed   By: Esperanza Heir M.D.   On: 01/14/2014 20:40   Dg Abd Portable 1v  01/14/2014   CLINICAL DATA:  80 year old female NG tube placement.  EXAM: PORTABLE ABDOMEN - 1 VIEW  COMPARISON:  CT Abdomen and Pelvis 01/14/2014.  FINDINGS: Portable AP supine view at 1852 hrs. Enteric tube placed, side hole at the level of the mid to lower mediastinum. Tip of the tube is near the level of the diaphragm. Stable bowel gas pattern. Stable lung bases.  IMPRESSION: 1. Enteric tube placed, but has not yet reached the stomach. Advance 10 cm to place the side hole within the stomach. 2. Stable bowel gas pattern.   Electronically Signed   By: Augusto Gamble M.D.   On: 01/14/2014 19:06    Shanker Levora Dredge, [name]  [facility] Pager:[phone]  If 7PM-7AM, please contact night-coverage [email or web address] Password [email or web address] 01/15/2014, 12:15 PM   LOS: 2 days   **Disclaimer: This note may have been dictated with voice recognition software. Similar sounding words can inadvertently be transcribed and this note may contain transcription errors which may not have been corrected upon publication of note.**

## 2014-01-15 NOTE — Progress Notes (Signed)
Feeling better Sitting up in chair self-bathing, smiling & calm CMA in room Denies abd pain  -abd min distended & soft -NGT thinly bilious -No flatus/BM yet  PSBO stable w clinical improvement/stability -cont NGT -IVF -agree w AXR in AM  -The patient is stable.  There is no evidence of peritonitis, acute abdomen, nor shock.  There is no strong evidence of failure of improvement nor decline with current non-operative management.  There is no need for surgery at the present moment.  We will continue to follow.

## 2014-01-15 NOTE — Progress Notes (Signed)
Subjective: Some gas, no BM, she may feel better, she hasn't been out of bed.  Ng is working now.  Objective: Vital signs in last 24 hours: Temp:  [97.9 F (36.6 C)-98.6 F (37 C)] 98.5 F (36.9 C) (05/31 0443) Pulse Rate:  [66-80] 80 (05/31 0443) Resp:  [18-20] 18 (05/31 0443) BP: (125-179)/(61-88) 125/61 mmHg (05/31 0443) SpO2:  [92 %-96 %] 94 % (05/31 0443) Last BM Date: 01/13/14 150 per NG NPO Afebrile, NO VS since 4 AM Labs OK this Am No film Intake/Output from previous day: 05/30 0701 - 05/31 0700 In: 1656.3 [I.V.:1456.3; IV Piggyback:200] Out: 150 [Emesis/NG output:150] Intake/Output this shift:    General appearance: alert, cooperative and no distress Resp: clear to auscultation bilaterally GI: soft, non-tender; bowel sounds normal; no masses,  no organomegaly and minimal if any distension.  Lab Results:   Recent Labs  01/14/14 0055 01/15/14 0645  WBC 6.9 6.7  HGB 13.0 11.8*  HCT 39.3 35.9*  PLT 295 256    BMET  Recent Labs  01/14/14 0055 01/15/14 0645  NA 144 139  K 3.2* 4.3  CL 103 104  CO2 28 25  GLUCOSE 137* 84  BUN 21 17  CREATININE 1.01 0.86  CALCIUM 10.5 9.3   PT/INR No results found for this basename: LABPROT, INR,  in the last 72 hours   Recent Labs Lab 01/14/14 0055  AST 24  ALT 13  ALKPHOS 80  BILITOT 0.4  PROT 7.8  ALBUMIN 4.0     Lipase     Component Value Date/Time   LIPASE 33 01/14/2014 0055     Studies/Results: Ct Abdomen Pelvis W Contrast  01/14/2014   CLINICAL DATA:  Abdominal pain, acute onset  EXAM: CT ABDOMEN AND PELVIS WITH CONTRAST  TECHNIQUE: Multidetector CT imaging of the abdomen and pelvis was performed using the standard protocol following bolus administration of intravenous contrast.  CONTRAST:  80mL OMNIPAQUE IOHEXOL 300 MG/ML  SOLN  COMPARISON:  None.  FINDINGS: BODY WALL: Unremarkable.  LOWER CHEST: Aortic and mitral annular calcification. Extensive coronary atherosclerosis. Bilateral subpleural  reticulation.  ABDOMEN/PELVIS:  Liver: No focal abnormality.  Biliary: Cholelithiasis. There is gallbladder distention and intra and extrahepatic biliary ductal dilatation. No visible choledocholithiasis or other obstructive process.  Pancreas: Unremarkable.  Spleen: Unremarkable.  Adrenals: Unremarkable.  Kidneys and ureters: No hydronephrosis or stone.  Bladder: Unremarkable.  Reproductive: Unremarkable for age.  Bowel: Small bowel obstruction with proximal and mid small bowel loops dilated and fluid-filled. There is an abrupt transition point in the low central abdomen where there is twisting of small bowel. Transition point is best seen in the coronal projection. There is a small knuckle of bowel in the left groin which likely reflects a developing femoral hernia. There is no proximal obstruction at this level. Extensive colonic diverticulosis. There is reactive mesenteric edema and small volume ascites. No pneumatosis or free perforation. As permitted by stool and enteric contrast, no nonenhancing segments of bowel. No pericecal inflammation.  Retroperitoneum: No mass or adenopathy.  Peritoneum: No free fluid or gas.  Vascular: No acute abnormality.  OSSEOUS: No acute abnormalities. Diffuse and advanced degenerative disc disease.  IMPRESSION: 1. High-grade distal small bowel obstruction with transition point in the low abdomen where there are changes of adhesion or small bowel volvulus. 2. Cholelithiasis and biliary dilatation. Correlate with liver function tests to evaluate for biliary obstruction. 3. Extensive colonic diverticulosis.   Electronically Signed   By: Tiburcio PeaJonathan  Watts M.D.   On:  01/14/2014 05:35   Dg Abd Acute W/chest  01/14/2014   CLINICAL DATA:  80 year old female abdominal pain. Hypertension. Initial encounter.  EXAM: ACUTE ABDOMEN SERIES (ABDOMEN 2 VIEW & CHEST 1 VIEW)  COMPARISON:  Chest radiographs 09/2013 and earlier.  FINDINGS: Portable AP semi upright view of the chest at at 0140 hr.  Supine and left-side-down lateral decubitus views of the abdomen.  No pneumoperitoneum. Mildly featureless gas-filled bowel loops in the mid abdomen, but no abnormally dilated loops. Large bowel gas to the level of the splenic flexure and also in the rectum.  Interval cleared right lung base pneumonia. Stable cardiac size and mediastinal contours. Visualized tracheal air column is within normal limits. No pneumothorax or pulmonary edema. Underlying chronic increased interstitial opacity.  Scoliosis. Osteopenia. Aortoiliac calcified atherosclerosis noted. No acute osseous abnormality identified.  IMPRESSION: 1. Mildly featureless bowel loops in the mid abdomen are nonspecific and might relate to bowel inflammation. Non obstructed bowel gas pattern, no free air. 2. Clearance of the right lower lobe pneumonia seen in February. No acute cardiopulmonary abnormality.   Electronically Signed   By: Augusto Gamble M.D.   On: 01/14/2014 01:59   Dg Abd Portable 1v  01/14/2014   CLINICAL DATA:  NG tube placement  EXAM: PORTABLE ABDOMEN - 1 VIEW  COMPARISON:  01/14/2014  FINDINGS: NG tube has been advanced and projects over the stomach. Distal port is within the stomach.  IMPRESSION: NG tube in the stomach.   Electronically Signed   By: Esperanza Heir M.D.   On: 01/14/2014 20:40   Dg Abd Portable 1v  01/14/2014   CLINICAL DATA:  81 year old female NG tube placement.  EXAM: PORTABLE ABDOMEN - 1 VIEW  COMPARISON:  CT Abdomen and Pelvis 01/14/2014.  FINDINGS: Portable AP supine view at 1852 hrs. Enteric tube placed, side hole at the level of the mid to lower mediastinum. Tip of the tube is near the level of the diaphragm. Stable bowel gas pattern. Stable lung bases.  IMPRESSION: 1. Enteric tube placed, but has not yet reached the stomach. Advance 10 cm to place the side hole within the stomach. 2. Stable bowel gas pattern.   Electronically Signed   By: Augusto Gamble M.D.   On: 01/14/2014 19:06    Medications: . antiseptic oral  rinse  15 mL Mouth Rinse q12n4p  . chlorhexidine  15 mL Mouth Rinse BID  . heparin  5,000 Units Subcutaneous 3 times per day  . metoprolol  2.5 mg Intravenous 4 times per day    Assessment/Plan SBO COPD HYPERTENSION HYPOKALEMIA Hypercalcemia Hx of Gout GERD    Plan:  Mobilize, recheck film.   LOS: 2 days    Sherrie George 01/15/2014

## 2014-01-15 NOTE — Progress Notes (Signed)
MD notified about patient's BP 189/79 and HR 57 before giving scheduled lopressor. Orders received.

## 2014-01-16 ENCOUNTER — Inpatient Hospital Stay (HOSPITAL_COMMUNITY): Payer: Medicare HMO

## 2014-01-16 LAB — CBC
HCT: 39.3 % (ref 36.0–46.0)
HEMOGLOBIN: 12.8 g/dL (ref 12.0–15.0)
MCH: 29.6 pg (ref 26.0–34.0)
MCHC: 32.6 g/dL (ref 30.0–36.0)
MCV: 91 fL (ref 78.0–100.0)
Platelets: 274 10*3/uL (ref 150–400)
RBC: 4.32 MIL/uL (ref 3.87–5.11)
RDW: 14 % (ref 11.5–15.5)
WBC: 7 10*3/uL (ref 4.0–10.5)

## 2014-01-16 LAB — BASIC METABOLIC PANEL
BUN: 22 mg/dL (ref 6–23)
CHLORIDE: 103 meq/L (ref 96–112)
CO2: 20 meq/L (ref 19–32)
Calcium: 9.7 mg/dL (ref 8.4–10.5)
Creatinine, Ser: 0.91 mg/dL (ref 0.50–1.10)
GFR calc Af Amer: 63 mL/min — ABNORMAL LOW (ref >90)
GFR calc non Af Amer: 54 mL/min — ABNORMAL LOW (ref >90)
GLUCOSE: 60 mg/dL — AB (ref 70–99)
POTASSIUM: 4.1 meq/L (ref 3.7–5.3)
SODIUM: 142 meq/L (ref 137–147)

## 2014-01-16 LAB — TROPONIN I: Troponin I: <0.3 ng/mL (ref <0.30)

## 2014-01-16 MED ORDER — ALBUTEROL SULFATE (2.5 MG/3ML) 0.083% IN NEBU: 3.0000 mL | INHALATION_SOLUTION | Freq: Four times a day (QID) | RESPIRATORY_TRACT | Status: DC | PRN

## 2014-01-16 MED ORDER — ATENOLOL 25 MG PO TABS
25.0000 mg | ORAL_TABLET | Freq: Two times a day (BID) | ORAL | Status: DC
  Administered 2014-01-16 – 2014-01-18 (×4): 25 mg via ORAL
  Filled 2014-01-16 (×5): qty 1

## 2014-01-16 MED ORDER — SIMVASTATIN 40 MG PO TABS
40.0000 mg | ORAL_TABLET | Freq: Every day | ORAL | Status: DC
  Administered 2014-01-16 – 2014-01-17 (×2): 40 mg via ORAL
  Filled 2014-01-16 (×4): qty 1

## 2014-01-16 MED ORDER — POLYETHYLENE GLYCOL 3350 17 G PO PACK
17.0000 g | PACK | Freq: Every day | ORAL | Status: DC | PRN
  Filled 2014-01-16: qty 1

## 2014-01-16 NOTE — Progress Notes (Signed)
Utilization review completed.  

## 2014-01-16 NOTE — Progress Notes (Signed)
PATIENT DETAILS Name: [name] Age: 80 y.o. Sex: female Date of Birth: 02/16/1923 Admit Date: 01/13/2014 Admitting Physician Haydee Monicaachal A David, MD PCP:KIM, Fayrene FearingJAMES, MD  Subjective: Passing flatus.  Had cp overnight, now resolved.  Assessment/Plan: Active Problems:   SBO (small bowel obstruction) -admitted-supportive care with IVF, NGT and NPO. CCS following.  -(6/1) abdomen soft, Xray improved.  Tolerating clamped N/G and sips of clears. Will pull N/G -suspect secondary to adhesions    Hypercalcemia -resolved with hydrations-suspect mild hypercalcemia was secondary to dehydration  HTN -BP intermittently elevated.  Will resume atenolol and continue hydralazine PRN.  Dyslipidemia -resume Statins once diet resumes  Hx of recent Haemophilus influenza bacteremia  -The patient finished 28 days of intravenous ceftriaxone on 11/05/2013.  Protein-calorie malnutrition, severe  -start supplements when diet resumed  Disposition: Remain inpatient  DVT Prophylaxis: Prophylactic Heparin  Code Status: Full code   Family Communication None at bedside  Procedures:  None  CONSULTS:  general surgery  Time spent 40 minutes-which includes 50% of the time with face-to-face with patient/ family and coordinating care related to the above assessment and plan.    MEDICATIONS: Scheduled Meds: . antiseptic oral rinse  15 mL Mouth Rinse q12n4p  . atenolol  25 mg Oral BID  . bisacodyl  10 mg Rectal Daily  . chlorhexidine  15 mL Mouth Rinse BID  . heparin  5,000 Units Subcutaneous 3 times per day  . metoprolol  2.5 mg Intravenous 4 times per day  . simvastatin  40 mg Oral QHS   Continuous Infusions: . sodium chloride 0.9 % 1,000 mL with potassium chloride 20 mEq infusion 75 mL/hr at 01/15/14 1827   PRN Meds:.acetaminophen, albuterol, hydrALAZINE, ipratropium-albuterol, lactated ringers, lip balm, magic mouthwash, menthol-cetylpyridinium, morphine injection,  ondansetron (ZOFRAN) IV, ondansetron, phenol, polyethylene glycol  Antibiotics: Anti-infectives   None       PHYSICAL EXAM: Vital signs in last 24 hours: Filed Vitals:   01/15/14 2029 01/16/14 0016 01/16/14 0419 01/16/14 0838  BP: 150/73 139/74 156/84 168/82  Pulse: 75 68 79 71  Temp: 98.5 F (36.9 C)  97.9 F (36.6 C) 97.4 F (36.3 C)  TempSrc: Oral  Oral Oral  Resp: 18 18 18 18   Height:      Weight:      SpO2: 96%  93% 99%    Weight change:  Filed Weights   01/13/14 2359 01/14/14 0814  Weight: 47.174 kg (104 lb) 46.993 kg (103 lb 9.6 oz)   Body mass index is 17.24 kg/(m^2).   Gen Exam: Awake and alert with clear speech.  N/g in place. Neck: Supple, No JVD.   Chest: B/L Clear.  No w/c/r CVS: S1 S2 Regular, no murmurs.  Abdomen: soft, BS sluggish, non tender, non distended.  Extremities: no edema, lower extremities warm to touch. Neurologic: Non Focal.   Skin: No Rash.     Intake/Output from previous day:  Intake/Output Summary (Last 24 hours) at 01/16/14 1421 Last data filed at 01/16/14 1400  Gross per 24 hour  Intake   2395 ml  Output   1650 ml  Net    745 ml     LAB RESULTS: CBC  Recent Labs Lab 01/14/14 0055 01/15/14 0645 01/16/14 0630  WBC 6.9 6.7 7.0  HGB 13.0 11.8* 12.8  HCT 39.3 35.9* 39.3  PLT 295 256 274  MCV 90.3 90.4 91.0  MCH 29.9 29.7 29.6  MCHC 33.1 32.9 32.6  RDW 13.8  13.8 14.0  LYMPHSABS 1.9  --   --   MONOABS 0.4  --   --   EOSABS 0.2  --   --   BASOSABS 0.0  --   --     Chemistries   Recent Labs Lab 01/14/14 0055 01/15/14 0645 01/16/14 0630  NA 144 139 142  K 3.2* 4.3 4.1  CL 103 104 103  CO2 28 25 20   GLUCOSE 137* 84 60*  BUN 21 17 22   CREATININE 1.01 0.86 0.91  CALCIUM 10.5 9.3 9.7  MG  --  2.3  --      Cardiac Enzymes  Recent Labs Lab 01/16/14 0630 01/16/14 1103  TROPONINI <0.30 <0.30     Recent Labs  01/14/14 0055  LIPASE 33    RADIOLOGY STUDIES/RESULTS: Ct Abdomen Pelvis W  Contrast  01/14/2014   CLINICAL DATA:  Abdominal pain, acute onset  EXAM: CT ABDOMEN AND PELVIS WITH CONTRAST  TECHNIQUE: Multidetector CT imaging of the abdomen and pelvis was performed using the standard protocol following bolus administration of intravenous contrast.  CONTRAST:  80mL OMNIPAQUE IOHEXOL 300 MG/ML  SOLN  COMPARISON:  None.  FINDINGS: BODY WALL: Unremarkable.  LOWER CHEST: Aortic and mitral annular calcification. Extensive coronary atherosclerosis. Bilateral subpleural reticulation.  ABDOMEN/PELVIS:  Liver: No focal abnormality.  Biliary: Cholelithiasis. There is gallbladder distention and intra and extrahepatic biliary ductal dilatation. No visible choledocholithiasis or other obstructive process.  Pancreas: Unremarkable.  Spleen: Unremarkable.  Adrenals: Unremarkable.  Kidneys and ureters: No hydronephrosis or stone.  Bladder: Unremarkable.  Reproductive: Unremarkable for age.  Bowel: Small bowel obstruction with proximal and mid small bowel loops dilated and fluid-filled. There is an abrupt transition point in the low central abdomen where there is twisting of small bowel. Transition point is best seen in the coronal projection. There is a small knuckle of bowel in the left groin which likely reflects a developing femoral hernia. There is no proximal obstruction at this level. Extensive colonic diverticulosis. There is reactive mesenteric edema and small volume ascites. No pneumatosis or free perforation. As permitted by stool and enteric contrast, no nonenhancing segments of bowel. No pericecal inflammation.  Retroperitoneum: No mass or adenopathy.  Peritoneum: No free fluid or gas.  Vascular: No acute abnormality.  OSSEOUS: No acute abnormalities. Diffuse and advanced degenerative disc disease.  IMPRESSION: 1. High-grade distal small bowel obstruction with transition point in the low abdomen where there are changes of adhesion or small bowel volvulus. 2. Cholelithiasis and biliary dilatation.  Correlate with liver function tests to evaluate for biliary obstruction. 3. Extensive colonic diverticulosis.   Electronically Signed   By: Tiburcio Pea M.D.   On: 01/14/2014 05:35   Dg Abd Acute W/chest  01/14/2014   CLINICAL DATA:  80 year old female abdominal pain. Hypertension. Initial encounter.  EXAM: ACUTE ABDOMEN SERIES (ABDOMEN 2 VIEW & CHEST 1 VIEW)  COMPARISON:  Chest radiographs 09/2013 and earlier.  FINDINGS: Portable AP semi upright view of the chest at at 0140 hr. Supine and left-side-down lateral decubitus views of the abdomen.  No pneumoperitoneum. Mildly featureless gas-filled bowel loops in the mid abdomen, but no abnormally dilated loops. Large bowel gas to the level of the splenic flexure and also in the rectum.  Interval cleared right lung base pneumonia. Stable cardiac size and mediastinal contours. Visualized tracheal air column is within normal limits. No pneumothorax or pulmonary edema. Underlying chronic increased interstitial opacity.  Scoliosis. Osteopenia. Aortoiliac calcified atherosclerosis noted. No acute osseous abnormality identified.  IMPRESSION:  1. Mildly featureless bowel loops in the mid abdomen are nonspecific and might relate to bowel inflammation. Non obstructed bowel gas pattern, no free air. 2. Clearance of the right lower lobe pneumonia seen in February. No acute cardiopulmonary abnormality.   Electronically Signed   By: Augusto Gamble M.D.   On: 01/14/2014 01:59   Dg Abd Portable 1v  01/14/2014   CLINICAL DATA:  NG tube placement  EXAM: PORTABLE ABDOMEN - 1 VIEW  COMPARISON:  01/14/2014  FINDINGS: NG tube has been advanced and projects over the stomach. Distal port is within the stomach.  IMPRESSION: NG tube in the stomach.   Electronically Signed   By: Esperanza Heir M.D.   On: 01/14/2014 20:40   Dg Abd Portable 1v  01/14/2014   CLINICAL DATA:  80 year old female NG tube placement.  EXAM: PORTABLE ABDOMEN - 1 VIEW  COMPARISON:  CT Abdomen and Pelvis 01/14/2014.   FINDINGS: Portable AP supine view at 1852 hrs. Enteric tube placed, side hole at the level of the mid to lower mediastinum. Tip of the tube is near the level of the diaphragm. Stable bowel gas pattern. Stable lung bases.  IMPRESSION: 1. Enteric tube placed, but has not yet reached the stomach. Advance 10 cm to place the side hole within the stomach. 2. Stable bowel gas pattern.   Electronically Signed   By: Augusto Gamble M.D.   On: 01/14/2014 19:06    [place] [facility] Pager:[phone]  If 7PM-7AM, please contact night-coverage [email or web address] Password [email or web address] 01/16/2014, 2:21 PM   LOS: 3 days   **Disclaimer: This note may have been dictated with voice recognition software. Similar sounding words can inadvertently be transcribed and this note may contain transcription errors which may not have been corrected upon publication of note.**

## 2014-01-16 NOTE — Progress Notes (Signed)
Patient seen and examined this morning. Agree with plan outlined . Denies abdominal pain. Tolerating sips. NG removed.

## 2014-01-16 NOTE — Clinical Social Work Psychosocial (Signed)
Clinical Social Work Department BRIEF PSYCHOSOCIAL ASSESSMENT 01/16/2014  Patient:  [name], [name]     Account Number:  [id number]     Admit date:  01/13/2014  Clinical Social Worker:  Lovey Newcomer  Date/Time:  01/16/2014 11:30 AM  Referred by:  Physician  Date Referred:  01/16/2014 Referred for  SNF Placement   Other Referral:   Interview type:  Patient Other interview type:   Patient alert and oriented at time of assessment    PSYCHOSOCIAL DATA Living Status:  FAMILY Admitted from facility:   Level of care:   Primary support name:  Terrence Dupont Primary support relationship to patient:  SIBLING Degree of support available:   Support is fair.    CURRENT CONCERNS Current Concerns  Post-Acute Placement   Other Concerns:    SOCIAL WORK ASSESSMENT / PLAN CSW met with patient at bedside to complete assessment. CSW recognizes patient from a previous admission. Patient states that she still lives with her nephew and his children. Patient states that she is agreeable to SNF placement at [facility] again if she doesn't have to pay for it. CSW explained that we cannot guarantee she will not incur any cost for placement, but CSW could see if bed is available for patient at facility. Patient was in good spirits and recounted her last admission. She stated, "I remember you helped me with my power last time I was here, that really meant a lot to me." CSW explained to patient that her insurance company would have to approve her SNF stay in order to go to SNF. Patient verbalized understanding.   Assessment/plan status:  Psychosocial Support/Ongoing Assessment of Needs Other assessment/ plan:   Complete FL2, Fax, PASRR   Information/referral to community resources:   CSW contact information and SNF list given to patient.    PATIENT'S/FAMILY'S RESPONSE TO PLAN OF CARE: Patient states that she is agreeable to SNF placement at discharge. Patient was pleasant, appropriate, and appreciative  of CSW contact. CSW will assist with DC if appropriate.       Liz Beach MSW, [place], [place], [phone]

## 2014-01-16 NOTE — Clinical Social Work Placement (Signed)
Clinical Social Work Department CLINICAL SOCIAL WORK PLACEMENT NOTE 01/16/2014  Patient:  [name], [name]  Account Number:  [id number] Admit date:  01/13/2014  Clinical Social Worker:  Cherre Blanc, [place]  Date/time:  01/16/2014 11:30 AM  Clinical Social Work is seeking post-discharge placement for this patient at the following level of care:   SKILLED NURSING   (*CSW will update this form in Epic as items are completed)   01/16/2014  Patient/family provided with [facility] Department of Clinical Social Work's list of facilities offering this level of care within the geographic area requested by the patient (or if unable, by the patient's family).  01/16/2014  Patient/family informed of their freedom to choose among providers that offer the needed level of care, that participate in Medicare, Medicaid or managed care program needed by the patient, have an available bed and are willing to accept the patient.  01/16/2014  Patient/family informed of [facility]' ownership interest in [facility], as well as of the fact that they are under no obligation to receive care at this facility.  PASARR submitted to EDS on  PASARR number received from EDS on   FL2 transmitted to all facilities in geographic area requested by pt/family on  01/16/2014 FL2 transmitted to all facilities within larger geographic area on   Patient informed that his/her managed care company has contracts with or will negotiate with  certain facilities, including the following:     Patient/family informed of bed offers received:  01/16/2014 Patient chooses bed at [facility] Physician recommends and patient chooses bed at    Patient to be transferred to [facility] on   Patient to be transferred to facility by Ambulance  The following physician request were entered in Epic:   Additional Comments:    Roddie Mc MSW, [place], [place], [phone]

## 2014-01-16 NOTE — Evaluation (Signed)
Physical Therapy Evaluation Patient Details Name: [name] MRN: [id number] DOB: [birth year] Today's Date: 01/16/2014   History of Present Illness  80 yo admitted 5/30 with abdominal pain, SBO.  Clinical Impression  Patirnt ambulated in hall with RW. Pt will benefit from PT to address problems listed in note below. Pt reports a nephew lives with her but is not home 24/7. No family present for DC planning.she     Follow Up Recommendations Supervision/Assistance - 24 hour;Home health PT;SNF (no family present to discuss 24/7 caregivers.)    Equipment Recommendations  None recommended by PT    Recommendations for Other Services       Precautions / Restrictions Precautions Precautions: Fall      Mobility  Bed Mobility Overal bed mobility: Needs Assistance Bed Mobility: Sit to Supine       Sit to supine: Independent      Transfers Overall transfer level: Needs assistance Equipment used: Rolling walker (2 wheeled) Transfers: Sit to/from Stand Sit to Stand: Supervision            Ambulation/Gait Ambulation/Gait assistance: Min guard Ambulation Distance (Feet): 220 Feet Assistive device: Rolling walker (2 wheeled)       General Gait Details: pt maneuvred RW through room, around obstacles.  Stairs            Wheelchair Mobility    Modified Rankin (Stroke Patients Only)       Balance Overall balance assessment: Needs assistance Sitting-balance support: No upper extremity supported;Feet supported Sitting balance-Leahy Scale: Normal     Standing balance support: No upper extremity supported;During functional activity Standing balance-Leahy Scale: Fair                               Pertinent Vitals/Pain no c/o    Home Living Family/patient expects to be discharged to:: Private residence Living Arrangements: Alone   Type of Home: Apartment Home Access: Level entry     Home Layout: One level Home Equipment: Walker - 2  wheels;Cane - single point Additional Comments: pt states she now lives in a 1 level duplex beside her brother, no family present to confirm DC plan    Prior Function Level of Independence: Independent               Hand Dominance        Extremity/Trunk Assessment   Upper Extremity Assessment: Overall WFL for tasks assessed           Lower Extremity Assessment: Overall WFL for tasks assessed         Communication   Communication: No difficulties  Cognition Arousal/Alertness: Awake/alert Behavior During Therapy: WFL for tasks assessed/performed Overall Cognitive Status: No family/caregiver present to determine baseline cognitive functioning Area of Impairment: Orientation Orientation Level: Time (stated it was May, got day of week.)                  General Comments      Exercises        Assessment/Plan    PT Assessment Patient needs continued PT services  PT Diagnosis Difficulty walking;Generalized weakness   PT Problem List Decreased activity tolerance;Decreased mobility;Decreased knowledge of precautions;Decreased safety awareness;Decreased knowledge of use of DME  PT Treatment Interventions DME instruction;Gait training;Therapeutic activities;Functional mobility training;Patient/family education   PT Goals (Current goals can be found in the Care Plan section) Acute Rehab PT Goals Patient Stated Goal: i need to walk some more. PT  Goal Formulation: With patient Time For Goal Achievement: 01/30/14 Potential to Achieve Goals: Good    Frequency Min 3X/week   Barriers to discharge Decreased caregiver support      Co-evaluation               End of Session   Activity Tolerance: Patient tolerated treatment well Patient left: in bed;with call bell/phone within reach;with bed alarm set Nurse Communication: Mobility status         Time: [id number] PT Time Calculation (min): 18 min   Charges:   PT Evaluation $Initial PT Evaluation  Tier I: 1 Procedure PT Treatments $Gait Training: 8-22 mins   PT G Codes:          Rada HayKaren Elizabeth Emary Zalar 01/16/2014, 2:44 PM Blanchard KelchKaren Karsen Fellows PT [phone]

## 2014-01-16 NOTE — Progress Notes (Signed)
[facility] progress note. Chief complaint. Chest pain. History of present illness. This 80 year old female in hospital with a small bowel obstruction. She has no significant cardiac history per review of her past medical history. He complained of 7/10 central chest pain this morning. A 12-lead EKG was obtained at the bedside and I reviewed this. The EKG does not look significantly different from her prior EKG and does not look suggestive of acute ischemia. Patient was given a dose of IV morphine and I came to see her at bedside. By the time I arrived she was chest pain-free. She states the pain was sharp and located over the lower sternum. She denied radiation of the pain, diaphoresis, or associated nausea. Vital signs. Temperature 97.9, pulse 79, respiration 18, blood pressure 156/84. O2 sats 93%. General appearance. Thin frail elderly female who is alert and in no distress. Cardiac. Rate and rhythm regular with 2/systolic ejection murmur. Lungs. Breath sounds are clear. Abdomen. Soft with diffuse pain on palpation. NG tube in place with greenish drainage. Impression/plan. Problem #1. Chest pain. Unclear to me if this is pain resulting from cardiac source versus GI. Because it is possibly cardiac I will obtain troponin now and then every 6 hours for a total of 3 sets. We'll repeat 12-lead EKG in 6 hours to evaluate for any changes. Patient appears clinically stable per bedside evaluation and is currently pain-free.

## 2014-01-16 NOTE — Progress Notes (Signed)
NG tube was taken off per MD order. Patient denies any discomfort. No nausea/vomiting. Will continue to monitor.

## 2014-01-16 NOTE — Progress Notes (Signed)
Subjective: No complaints Passing flatus  Objective: Vital signs in last 24 hours: Temp:  [97.9 F (36.6 C)-98.7 F (37.1 C)] 97.9 F (36.6 C) 13-Feb-2023 0419) Pulse Rate:  [57-97] 79 2023-02-13 0419) Resp:  [18] 18 2023/02/13 0419) BP: (139-189)/(71-84) 156/84 mmHg 13-Feb-2023 0419) SpO2:  [93 %-96 %] 93 % February 13, 2023 0419) Last BM Date: 01/15/14  Intake/Output from previous day: 05/31 0701 - 02/13/23 0700 In: 1923.8 [P.O.:100; I.V.:1823.8] Out: 1275 [Urine:1100; Emesis/NG output:175] Intake/Output this shift:    Abdomen soft, non tender, non distended  Lab Results:   Recent Labs  01/15/14 0645 2014-02-12 0630  WBC 6.7 7.0  HGB 11.8* 12.8  HCT 35.9* 39.3  PLT 256 274   BMET  Recent Labs  01/14/14 0055 01/15/14 0645  NA 144 139  K 3.2* 4.3  CL 103 104  CO2 28 25  GLUCOSE 137* 84  BUN 21 17  CREATININE 1.01 0.86  CALCIUM 10.5 9.3   PT/INR No results found for this basename: LABPROT, INR,  in the last 72 hours ABG No results found for this basename: PHART, PCO2, PO2, HCO3,  in the last 72 hours  Studies/Results: Dg Abd 2 Views  02-12-14   CLINICAL DATA:  Small bowel obstruction and abdominal pain.  EXAM: ABDOMEN - 2 VIEW  COMPARISON:  01/14/2014 and CT 01/14/2014  FINDINGS: Upright and supine views of the abdomen were obtained. There is no evidence for free air. There is now oral contrast in the colon. There continues to be a few gas-filled loops of small bowel but, overall, there appears to be decreased small bowel distention. Increased densities at the left lung base could represent consolidation and pleural fluid. Nasogastric tube is in the gastric fundus region. Prominent interstitial lung markings at the lung bases suggest chronic changes.  IMPRESSION: Oral contrast has advanced into the colon. There continues to be a few distended loops of small bowel. Findings may represent a partial small bowel obstruction.  Increased densities at the left lung base are suggestive for  consolidation and/or pleural fluid.  Nasogastric tube in the stomach.   Electronically Signed   By: Richarda Overlie M.D.   On: 02-12-2014 08:04   Dg Abd Portable 1v  01/14/2014   CLINICAL DATA:  NG tube placement  EXAM: PORTABLE ABDOMEN - 1 VIEW  COMPARISON:  01/14/2014  FINDINGS: NG tube has been advanced and projects over the stomach. Distal port is within the stomach.  IMPRESSION: NG tube in the stomach.   Electronically Signed   By: Esperanza Heir M.D.   On: 01/14/2014 20:40   Dg Abd Portable 1v  01/14/2014   CLINICAL DATA:  80 year old female NG tube placement.  EXAM: PORTABLE ABDOMEN - 1 VIEW  COMPARISON:  CT Abdomen and Pelvis 01/14/2014.  FINDINGS: Portable AP supine view at 1852 hrs. Enteric tube placed, side hole at the level of the mid to lower mediastinum. Tip of the tube is near the level of the diaphragm. Stable bowel gas pattern. Stable lung bases.  IMPRESSION: 1. Enteric tube placed, but has not yet reached the stomach. Advance 10 cm to place the side hole within the stomach. 2. Stable bowel gas pattern.   Electronically Signed   By: Augusto Gamble M.D.   On: 01/14/2014 19:06    Anti-infectives: Anti-infectives   None      Assessment/Plan:  Resolving partial SBO  Will d/c NG given xray improvement and allow sips.  Hopefully will continue to progress.  LOS: 3 days  Shelly RubensteinDouglas A Haron Beilke 01/16/2014

## 2014-01-17 ENCOUNTER — Encounter (HOSPITAL_COMMUNITY): Payer: Self-pay | Admitting: Physician Assistant

## 2014-01-17 NOTE — Care Management Note (Addendum)
    Page 1 of 2   01/18/2014     2:00:48 PM CARE MANAGEMENT NOTE 01/18/2014  Patient:  [name], [name]   Account Number:  [id number]  Date Initiated:  01/17/2014  Documentation initiated by:  Letha Cape  Subjective/Objective Assessment:   dx htn, sbo  admit- from home alone.     Action/Plan:   pt rec snf- insurance denied snf - pt will need hh services.   Anticipated DC Date:  01/18/2014   Anticipated DC Plan:  HOME W HOME HEALTH SERVICES  In-house referral  Clinical Social Worker      DC Planning Services  CM consult      [facility] Choice  HOME HEALTH   Choice offered to / List presented to:  C-1 Patient   DME arranged  WALKER - ROLLING  NEBULIZER MACHINE      DME agency  [facility]     HH arranged  HH-1 RN  HH-2 PT  HH-3 OT  HH-4 NURSE'S AIDE  HH-6 SOCIAL WORKER      HH agency  [facility]   Status of service:  Completed, signed off Medicare Important Message given?  YES (If response is "NO", the following Medicare IM given date fields will be blank) Date Medicare IM given:  01/16/2014 Date Additional Medicare IM given:  01/18/2014  Discharge Disposition:  HOME W HOME HEALTH SERVICES  Per UR Regulation:  Reviewed for med. necessity/level of care/duration of stay  If discussed at Long Length of Stay Meetings, dates discussed:    Comments:  01/18/14 1141 Letha Cape RN, BSN [phone] patient chose [facility] for [facility], PT, Ot, aide and Child psychotherapist, referral given to [facility], [employer] notified.  Soc will begin 24-48 hrs post dc.  DME faxed to [facility] and they will deliver to patient's home.  01/17/14 1802 Letha Cape RN, BSN [phone] physical therapy rec snf for patient, her insurance has denied snf request, patient will need hh orders, plan is for home , NCM will offer choice for hh services.

## 2014-01-17 NOTE — Evaluation (Signed)
Occupational Therapy Evaluation Patient Details Name: [name] MRN: [id number] DOB: 04-24-1923 Today's Date: 01/17/2014    History of Present Illness 80 yo admitted 5/30 with abdominal pain, SBO.   Clinical Impression   Pt demonstrates decline in function with ADLs and ADL mobility safety with decreased strength, balance and endurance. Pt would benefit from acute OT services to address impairments to increase level of function and safety    Follow Up Recommendations  Home health OT;Other (comment) [facility] aide)    Equipment Recommendations  Tub/shower bench    Recommendations for Other Services       Precautions / Restrictions Precautions Precautions: Fall Restrictions Weight Bearing Restrictions: No      Mobility Bed Mobility Overal bed mobility: Independent Bed Mobility: Sit to Supine;Supine to Sit     Supine to sit: Independent Sit to supine: Independent      Transfers Overall transfer level: Needs assistance Equipment used: 1 person hand held assist Transfers: Sit to/from Stand Sit to Stand: Supervision              Balance Overall balance assessment: Needs assistance Sitting-balance support: No upper extremity supported;Feet supported Sitting balance-Leahy Scale: Good     Standing balance support: Single extremity supported;No upper extremity supported;During functional activity Standing balance-Leahy Scale: Fair                              ADL Overall ADL's : Needs assistance/impaired     Grooming: Wash/dry hands;Wash/dry face;Min guard;Standing   Upper Body Bathing: Set up;Min guard;Sitting;Standing   Lower Body Bathing: Min guard;Sit to/from stand   Upper Body Dressing : Set up;Min guard;Sitting;Standing   Lower Body Dressing: Sit to/from stand;Min guard   Toilet Transfer: Supervision/safety   Toileting- Clothing Manipulation and Hygiene: Min guard;Sit to/from stand       Functional mobility during ADLs:  Supervision/safety;Min guard       Vision  wears glasses at all times, cataracts                              Pertinent Vitals/Pain 3/10 abdominal pain reported, VSS     Hand Dominance Right   Extremity/Trunk Assessment Upper Extremity Assessment Upper Extremity Assessment: Generalized weakness   Lower Extremity Assessment Lower Extremity Assessment: Defer to PT evaluation   Cervical / Trunk Assessment Cervical / Trunk Assessment: Normal   Communication Communication Communication: No difficulties   Cognition Arousal/Alertness: Awake/alert Behavior During Therapy: WFL for tasks assessed/performed Overall Cognitive Status: Within Functional Limits for tasks assessed                     General Comments   Pt very pleasant and cooperative                 Home Living Family/patient expects to be discharged to:: Private residence Living Arrangements: Alone Available Help at Discharge: Family;Available PRN/intermittently Type of Home: Apartment Home Access: Level entry     Home Layout: One level     Bathroom Shower/Tub: Chief Strategy Officer: Standard     Home Equipment: Environmental consultant - 2 wheels;Cane - single point          Prior Functioning/Environment Level of Independence: Independent             OT Diagnosis:  generalized weakness   OT Problem List: Impaired balance (sitting and/or standing);Decreased activity tolerance;Decreased  strength;Decreased knowledge of use of DME or AE   OT Treatment/Interventions: Self-care/ADL training;Therapeutic exercise;Patient/family education;Neuromuscular education;Balance training;Therapeutic activities;DME and/or AE instruction    OT Goals(Current goals can be found in the care plan section) Acute Rehab OT Goals Patient Stated Goal: go home OT Goal Formulation: With patient Time For Goal Achievement: 01/24/14 Potential to Achieve Goals: Good ADL Goals Pt Will Perform Grooming:  with set-up;with modified independence;with supervision;standing Pt Will Perform Upper Body Bathing: with supervision;with set-up;with modified independence;standing Pt Will Perform Lower Body Bathing: with supervision;with set-up;with modified independence;sit to/from stand Pt Will Perform Upper Body Dressing: with supervision;with set-up;with modified independence;standing Pt Will Perform Lower Body Dressing: with supervision;with set-up;with modified independence;sit to/from stand Pt Will Transfer to Toilet: with modified independence;regular height toilet;ambulating;grab bars Pt Will Perform Toileting - Clothing Manipulation and hygiene: with modified independence;sit to/from stand Pt Will Perform Tub/Shower Transfer: tub bench;grab bars;with supervision;with modified independence  OT Frequency: Min 2X/week   Barriers to D/C: Decreased caregiver support  uncertain if pt canhave 24/7 sup/assist. She states that he rnrphew stays with her sometimes                     End of Session Equipment Utilized During Treatment: Gait belt;Other (comment) (BSC)  Activity Tolerance: Patient tolerated treatment well Patient left: in bed;with call bell/phone within reach;with bed alarm set   Time: [id number] OT Time Calculation (min): 22 min Charges:  OT General Charges $OT Visit: 1 Procedure OT Evaluation $Initial OT Evaluation Tier I: 1 Procedure OT Treatments $Therapeutic Activity: 8-22 mins G-Codes:    Lafe GarinDenise J Akya Fiorello 01/17/2014, 1:23 PM

## 2014-01-17 NOTE — Progress Notes (Signed)
  Subjective: No complaints Tolerated liquids Passing flatus  Objective: Vital signs in last 24 hours: Temp:  [97.4 F (36.3 C)-97.9 F (36.6 C)] 97.8 F (36.6 C) (06/02 0742) Pulse Rate:  [67-72] 71 (06/02 0742) Resp:  [18-20] 18 (06/02 0742) BP: (133-168)/(65-87) 133/65 mmHg (06/02 0742) SpO2:  [95 %-99 %] 95 % (06/02 0742) Last BM Date: 01/15/14  Intake/Output from previous day: 01-25-2023 0701 - 06/02 0700 In: 2430 [P.O.:1500; I.V.:930] Out: 650 [Urine:650] Intake/Output this shift:    Abdomen soft, non tender, non distended  Lab Results:   Recent Labs  01/15/14 0645 Jan 24, 2014 0630  WBC 6.7 7.0  HGB 11.8* 12.8  HCT 35.9* 39.3  PLT 256 274   BMET  Recent Labs  01/15/14 0645 2014/01/24 0630  NA 139 142  K 4.3 4.1  CL 104 103  CO2 25 20  GLUCOSE 84 60*  BUN 17 22  CREATININE 0.86 0.91  CALCIUM 9.3 9.7   PT/INR No results found for this basename: LABPROT, INR,  in the last 72 hours ABG No results found for this basename: PHART, PCO2, PO2, HCO3,  in the last 72 hours  Studies/Results: Dg Abd 2 Views  01-24-14   CLINICAL DATA:  Small bowel obstruction and abdominal pain.  EXAM: ABDOMEN - 2 VIEW  COMPARISON:  01/14/2014 and CT 01/14/2014  FINDINGS: Upright and supine views of the abdomen were obtained. There is no evidence for free air. There is now oral contrast in the colon. There continues to be a few gas-filled loops of small bowel but, overall, there appears to be decreased small bowel distention. Increased densities at the left lung base could represent consolidation and pleural fluid. Nasogastric tube is in the gastric fundus region. Prominent interstitial lung markings at the lung bases suggest chronic changes.  IMPRESSION: Oral contrast has advanced into the colon. There continues to be a few distended loops of small bowel. Findings may represent a partial small bowel obstruction.  Increased densities at the left lung base are suggestive for consolidation  and/or pleural fluid.  Nasogastric tube in the stomach.   Electronically Signed   By: Richarda Overlie M.D.   On: January 24, 2014 08:04    Anti-infectives: Anti-infectives   None      Assessment/Plan: Resolved pSBO  Advance diet slowly as tolerated Will sign off.  Call us back if things change.  LOS: 4 days    Shelly Rubenstein 01/17/2014

## 2014-01-17 NOTE — Progress Notes (Signed)
PATIENT DETAILS Name: [name] Age: 80 y.o. Sex: female Date of Birth: Jun 30, 1923 Admit Date: 01/13/2014 Admitting Physician Haydee Monica, MD PCP:KIM, Fayrene Fearing, MD  Subjective: + BM after receiving suppository.  Assessment/Plan: Active Problems:   SBO (small bowel obstruction) -admitted-supportive care with IVF, NGT and NPO. CCS following.  -(6/1) abdomen soft, Xray improved.  Tolerating clamped N/G and some diet.  NGT d/c 6/1 -suspect secondary to adhesions +/- constipation. -+bm on 6/2 tolerated clears for breakfast.  Advancing diet. -d/c narcotics   Hypercalcemia -resolved with hydrations-suspect mild hypercalcemia was secondary to dehydration  HTN -BP intermittently elevated.  Will resume atenolol25 bid [usually once daily med?]and continue hydralazine PRN.  Dyslipidemia -resume Statins once diet resumes  Hx of recent Haemophilus influenza bacteremia  -The patient finished 28 days of intravenous ceftriaxone on 11/05/2013.  Protein-calorie malnutrition, severe  -start supplements when diet resumed  Disposition: Remain inpatient to advance diet.  Will d/c to SNF hopefully 6/3.  DVT Prophylaxis: Prophylactic Heparin  Code Status: Full code   Family Communication None at bedside  Procedures:  None  CONSULTS:  general surgery  Time spent 40 minutes-which includes 50% of the time with face-to-face with patient/ family and coordinating care related to the above assessment and plan.    MEDICATIONS: Scheduled Meds: . atenolol  25 mg Oral BID  . bisacodyl  10 mg Rectal Daily  . heparin  5,000 Units Subcutaneous 3 times per day  . simvastatin  40 mg Oral QHS   Continuous Infusions: . sodium chloride 0.9 % 1,000 mL with potassium chloride 20 mEq infusion 75 mL/hr at 01/17/14 0618   PRN Meds:.acetaminophen, albuterol, hydrALAZINE, ipratropium-albuterol, lip balm, magic mouthwash, menthol-cetylpyridinium, morphine injection, ondansetron  (ZOFRAN) IV, ondansetron, phenol, polyethylene glycol  Antibiotics: Anti-infectives   None       PHYSICAL EXAM: Vital signs in last 24 hours: Filed Vitals:   01/16/14 2056 01/16/14 2207 01/17/14 0529 01/17/14 0742  BP: 167/87 157/75 160/80 133/65  Pulse: 69  67 71  Temp: 97.4 F (36.3 C)  97.9 F (36.6 C) 97.8 F (36.6 C)  TempSrc: Oral  Oral Oral  Resp: 18  20 18   Height:      Weight:      SpO2: 97%  95% 95%    Weight change:  Filed Weights   01/13/14 2359 01/14/14 0814  Weight: 47.174 kg (104 lb) 46.993 kg (103 lb 9.6 oz)   Body mass index is 17.24 kg/(m^2).   Gen Exam: Awake and alert with clear speech.  Sitting up.  Has finished clear liquid breakfast. Very pleasant Neck: Supple, No JVD.   Chest: B/L Clear.  No w/c/r CVS: S1 S2 Regular, no murmurs.  Abdomen: soft, BS sluggish, non tender, non distended.    Intake/Output from previous day:  Intake/Output Summary (Last 24 hours) at 01/17/14 1356 Last data filed at 01/17/14 0953  Gross per 24 hour  Intake   2070 ml  Output    450 ml  Net   1620 ml     LAB RESULTS: CBC  Recent Labs Lab 01/14/14 0055 01/15/14 0645 01/16/14 0630  WBC 6.9 6.7 7.0  HGB 13.0 11.8* 12.8  HCT 39.3 35.9* 39.3  PLT 295 256 274  MCV 90.3 90.4 91.0  MCH 29.9 29.7 29.6  MCHC 33.1 32.9 32.6  RDW 13.8 13.8 14.0  LYMPHSABS 1.9  --   --   MONOABS 0.4  --   --  EOSABS 0.2  --   --   BASOSABS 0.0  --   --     Chemistries   Recent Labs Lab 01/14/14 0055 01/15/14 0645 01/16/14 0630  NA 144 139 142  K 3.2* 4.3 4.1  CL 103 104 103  CO2 28 25 20   GLUCOSE 137* 84 60*  BUN 21 17 22   CREATININE 1.01 0.86 0.91  CALCIUM 10.5 9.3 9.7  MG  --  2.3  --      Cardiac Enzymes  Recent Labs Lab 01/16/14 0630 01/16/14 1103 01/16/14 1702  TROPONINI <0.30 <0.30 <0.30    No results found for this basename: LIPASE, AMYLASE,  in the last 72 hours  RADIOLOGY STUDIES/RESULTS: Ct Abdomen Pelvis W Contrast  01/14/2014    CLINICAL DATA:  Abdominal pain, acute onset  EXAM: CT ABDOMEN AND PELVIS WITH CONTRAST  TECHNIQUE: Multidetector CT imaging of the abdomen and pelvis was performed using the standard protocol following bolus administration of intravenous contrast.  CONTRAST:  80mL OMNIPAQUE IOHEXOL 300 MG/ML  SOLN  COMPARISON:  None.  FINDINGS: BODY WALL: Unremarkable.  LOWER CHEST: Aortic and mitral annular calcification. Extensive coronary atherosclerosis. Bilateral subpleural reticulation.  ABDOMEN/PELVIS:  Liver: No focal abnormality.  Biliary: Cholelithiasis. There is gallbladder distention and intra and extrahepatic biliary ductal dilatation. No visible choledocholithiasis or other obstructive process.  Pancreas: Unremarkable.  Spleen: Unremarkable.  Adrenals: Unremarkable.  Kidneys and ureters: No hydronephrosis or stone.  Bladder: Unremarkable.  Reproductive: Unremarkable for age.  Bowel: Small bowel obstruction with proximal and mid small bowel loops dilated and fluid-filled. There is an abrupt transition point in the low central abdomen where there is twisting of small bowel. Transition point is best seen in the coronal projection. There is a small knuckle of bowel in the left groin which likely reflects a developing femoral hernia. There is no proximal obstruction at this level. Extensive colonic diverticulosis. There is reactive mesenteric edema and small volume ascites. No pneumatosis or free perforation. As permitted by stool and enteric contrast, no nonenhancing segments of bowel. No pericecal inflammation.  Retroperitoneum: No mass or adenopathy.  Peritoneum: No free fluid or gas.  Vascular: No acute abnormality.  OSSEOUS: No acute abnormalities. Diffuse and advanced degenerative disc disease.  IMPRESSION: 1. High-grade distal small bowel obstruction with transition point in the low abdomen where there are changes of adhesion or small bowel volvulus. 2. Cholelithiasis and biliary dilatation. Correlate with liver  function tests to evaluate for biliary obstruction. 3. Extensive colonic diverticulosis.   Electronically Signed   By: Tiburcio Pea M.D.   On: 01/14/2014 05:35   Dg Abd Acute W/chest  01/14/2014   CLINICAL DATA:  80 year old female abdominal pain. Hypertension. Initial encounter.  EXAM: ACUTE ABDOMEN SERIES (ABDOMEN 2 VIEW & CHEST 1 VIEW)  COMPARISON:  Chest radiographs 09/2013 and earlier.  FINDINGS: Portable AP semi upright view of the chest at at 0140 hr. Supine and left-side-down lateral decubitus views of the abdomen.  No pneumoperitoneum. Mildly featureless gas-filled bowel loops in the mid abdomen, but no abnormally dilated loops. Large bowel gas to the level of the splenic flexure and also in the rectum.  Interval cleared right lung base pneumonia. Stable cardiac size and mediastinal contours. Visualized tracheal air column is within normal limits. No pneumothorax or pulmonary edema. Underlying chronic increased interstitial opacity.  Scoliosis. Osteopenia. Aortoiliac calcified atherosclerosis noted. No acute osseous abnormality identified.  IMPRESSION: 1. Mildly featureless bowel loops in the mid abdomen are nonspecific and might relate to  bowel inflammation. Non obstructed bowel gas pattern, no free air. 2. Clearance of the right lower lobe pneumonia seen in February. No acute cardiopulmonary abnormality.   Electronically Signed   By: Augusto GambleLee  Hall M.D.   On: 01/14/2014 01:59   Dg Abd Portable 1v  01/14/2014   CLINICAL DATA:  NG tube placement  EXAM: PORTABLE ABDOMEN - 1 VIEW  COMPARISON:  01/14/2014  FINDINGS: NG tube has been advanced and projects over the stomach. Distal port is within the stomach.  IMPRESSION: NG tube in the stomach.   Electronically Signed   By: Esperanza Heiraymond  Rubner M.D.   On: 01/14/2014 20:40   Dg Abd Portable 1v  01/14/2014   CLINICAL DATA:  80 year old female NG tube placement.  EXAM: PORTABLE ABDOMEN - 1 VIEW  COMPARISON:  CT Abdomen and Pelvis 01/14/2014.  FINDINGS: Portable  AP supine view at 1852 hrs. Enteric tube placed, side hole at the level of the mid to lower mediastinum. Tip of the tube is near the level of the diaphragm. Stable bowel gas pattern. Stable lung bases.  IMPRESSION: 1. Enteric tube placed, but has not yet reached the stomach. Advance 10 cm to place the side hole within the stomach. 2. Stable bowel gas pattern.   Electronically Signed   By: Augusto GambleLee  Hall M.D.   On: 01/14/2014 19:06    Conley CanalMarianne L York, PA-C [facility] Pager:[phone]  If 7PM-7AM, please contact night-coverage [email or web address] Password [email or web address] 01/17/2014, 1:56 PM   LOS: 4 days   **Disclaimer: This note may have been dictated with voice recognition software. Similar sounding words can inadvertently be transcribed and this note may contain transcription errors which may not have been corrected upon publication of note.**     I agree with the History/assesment & plan per Midlevel provider as per above Further details as follows:-              Looks well  tol PO  Seems stable for d/c in am if all remains the same  Rest as [per above ammended note   Pleas KochJai Trease Bremner, MD [facility] [phone]) [phone]

## 2014-01-17 NOTE — Clinical Social Work Note (Signed)
CSW received call from facility stating that patient has been denied for SNF placement by insurance company. Patient unable to pay for SNF privately. Patient will DC to home with HHPT when appropriate. RNCM notified.   Roddie Mc MSW, [place], [place], [phone]

## 2014-01-17 NOTE — Progress Notes (Signed)
Physical Therapy Treatment Patient Details Name: [name] MRN: [id number] DOB: Apr 20, 1923 Today's Date: 01/17/2014    History of Present Illness 80 yo admitted 5/30 with abdominal pain, SBO.    PT Comments    Pt continues to improve. Pt could benefit from STSNF as pt lives alone and nephew  And son no available 24/7. If DC'd to home recommend HHPT , HHOT and an aid if possible.   Follow Up Recommendations  Supervision/Assistance - 24 hour;Home health PT;SNF     Equipment Recommendations  Rolling walker with 5" wheels    Recommendations for Other Services       Precautions / Restrictions Precautions Precautions: Fall Restrictions Weight Bearing Restrictions: No    Mobility  Bed Mobility Overal bed mobility: Independent                Transfers Overall transfer level: Needs assistance Equipment used: None Transfers: Sit to/from Stand Sit to Stand: Supervision            Ambulation/Gait Ambulation/Gait assistance: Min guard Ambulation Distance (Feet): 220 Feet Assistive device: 1 person hand held assist (rail)       General Gait Details: had pt ambulate without RW as pt does not use at home, pt cruised on furniture and objects , in hall did not always rely on rail, pt ambulates cautiously.    Stairs            Wheelchair Mobility    Modified Rankin (Stroke Patients Only)       Balance     Sitting balance-Leahy Scale: Normal     Standing balance support: No upper extremity supported Standing balance-Leahy Scale: Fair                      Cognition Arousal/Alertness: Awake/alert Behavior During Therapy: WFL for tasks assessed/performed Overall Cognitive Status: Within Functional Limits for tasks assessed                      Exercises      General Comments        Pertinent Vitals/Pain No c/o, cough is productive    Home Living Family/patient expects to be discharged to:: Private residence Living  Arrangements: Alone Available Help at Discharge: Family;Available PRN/intermittently Type of Home: Apartment Home Access: Level entry   Home Layout: One level Home Equipment: Walker - 2 wheels;Cane - single point      Prior Function Level of Independence: Independent          PT Goals (current goals can now be found in the care plan section) Progress towards PT goals: Progressing toward goals    Frequency       PT Plan Current plan remains appropriate    Co-evaluation             End of Session Equipment Utilized During Treatment: Gait belt Activity Tolerance: Patient tolerated treatment well Patient left: in bed;with call bell/phone within reach;with bed alarm set     Time: 1120-1146 PT Time Calculation (min): 26 min  Charges:                       G Codes:      Rada Hay 01/17/2014, 11:55 AM Blanchard Kelch PT [phone]

## 2014-01-18 LAB — BASIC METABOLIC PANEL
BUN: 10 mg/dL (ref 6–23)
CALCIUM: 9 mg/dL (ref 8.4–10.5)
CHLORIDE: 108 meq/L (ref 96–112)
CO2: 23 mEq/L (ref 19–32)
CREATININE: 0.83 mg/dL (ref 0.50–1.10)
GFR calc Af Amer: 70 mL/min — ABNORMAL LOW (ref >90)
GFR calc non Af Amer: 61 mL/min — ABNORMAL LOW (ref >90)
Glucose, Bld: 95 mg/dL (ref 70–99)
Potassium: 4.1 mEq/L (ref 3.7–5.3)
SODIUM: 140 meq/L (ref 137–147)

## 2014-01-18 MED ORDER — TRAMADOL-ACETAMINOPHEN 37.5-325 MG PO TABS
1.0000 | ORAL_TABLET | Freq: Four times a day (QID) | ORAL | Status: DC | PRN
  Administered 2014-01-18: 1 via ORAL
  Filled 2014-01-18: qty 1

## 2014-01-18 MED ORDER — TRAMADOL-ACETAMINOPHEN 37.5-325 MG PO TABS: 1.0000 | ORAL_TABLET | Freq: Four times a day (QID) | ORAL | Status: DC | PRN

## 2014-01-18 MED ORDER — IPRATROPIUM-ALBUTEROL 0.5-2.5 (3) MG/3ML IN SOLN: 3.0000 mL | Freq: Four times a day (QID) | RESPIRATORY_TRACT | Status: DC | PRN

## 2014-01-18 NOTE — Discharge Summary (Signed)
Physician Discharge Summary  SHARRELL KRAWIEC ZOX:[id number] DOB: [birth year] DOA: 01/13/2014  PCP: Pearson Grippe, MD  Admit date: 01/13/2014 Discharge date: 01/18/2014  Time spent: 45 minutes  Recommendations for Outpatient Follow-up:  1. Patient hospitalized with small bowel obstruction 2. Decreased narcotic pain medications 3. Check bmet at hospital follow up appointment 4. Will be discharged to home with home health RN, PT, OT, aide, social work.  Discharge Diagnoses:  Active Problems:   Hypokalemia   Protein-calorie malnutrition, severe   SBO (small bowel obstruction)   Hypercalcemia   Discharge Condition: Stable  Diet recommendation: As tolerated  Filed Weights   01/13/14 2359 01/14/14 0814  Weight: 47.174 kg (104 lb) 46.993 kg (103 lb 9.6 oz)    History of present illness:  80 year old female with a history of hypertension, COPD, gouty arthritis, and recent Haemophilus influenza bacteremia, presented to the emergency department with a one-day history of severe abdominal pain. CT abdomen and pelvis in the emergency department showed a high grade small bowel obstruction. There was a concern for small bowel volvulus.  Hospital Course:  SBO (small bowel obstruction)  -admitted-supportive care with IVF, NGT and NPO. CCS consultation. -(6/1) abdomen soft, Xray improved. Tolerating clamped N/G and clear liquid diet. NGT d/c 6/1 p.m. -suspect secondary to adhesions, narcotic use +/- constipation.  -+bm on 6/2 tolerated clears for breakfast. Advancing diet.  -Recommend d/c narcotics.  Patient reports she takes 2 Percocet every morning "to take care for her aches and pains" -Will prescribe Ultracet on discharge, as well as MiraLAX and Colace.  Hypercalcemia  -resolved with hydrations-suspect mild hypercalcemia was secondary to dehydration   HTN  -Blood pressure was intermittently elevated. -While n.p.o. she was on when necessary IV hydralazine -Atenolol was resumed prior to  discharge. Blood pressure stable  Dyslipidemia  -resume Statins  Hx of recent Haemophilus influenza bacteremia  -The patient finished 28 days of intravenous ceftriaxone on 11/05/2013.  - No signs of infection during this admission  Protein-calorie malnutrition, severe  -Appreciate nutrition consultation. -Protein supplements were provided inpatient and prescribed at discharge  Disposition Physical therapy recommended patient be discharged to a skilled nursing facility for physical therapy rehabilitation. Unfortunately the patient's insurance company would not financially support this recommendation. The patient was discharged to home with full home health services.    Procedures:  NG placement  Consultations:  [facility]  Discharge Exam: Filed Vitals:   01/18/14 0620  BP: 129/62  Pulse:   Temp:   Resp:    Gen Exam: Awake and alert with clear speech. Sitting up. Has tolerated a solid breakfast. Very pleasant  Neck: Supple, No JVD. No erythema or exudates in her oropharynx Chest: B/L Clear. No w/c/r  CVS: S1 S2 Regular, no murmurs.  Abdomen: soft, BS normal, non tender, non distended.  Extremities: no swelling, warm to touch   Discharge Instructions       Discharge Instructions   Diet - low sodium heart healthy    Complete by:  As directed      Increase activity slowly    Complete by:  As directed             Medication List         albuterol 108 (90 BASE) MCG/ACT inhaler  Commonly known as:  PROVENTIL HFA;VENTOLIN HFA  Inhale 2 puffs into the lungs every 6 (six) hours as needed for wheezing or shortness of breath.     allopurinol 300 MG tablet  Commonly known as:  ZYLOPRIM  Take 450 mg by mouth at bedtime.     atenolol 25 MG tablet  Commonly known as:  TENORMIN  Take 25 mg by mouth 2 (two) times daily.     ipratropium-albuterol 0.5-2.5 (3) MG/3ML Soln  Commonly known as:  DUONEB  Take 3 mLs by nebulization every 6 (six) hours as  needed.     polyethylene glycol packet  Commonly known as:  MIRALAX / GLYCOLAX  Take 17 g by mouth daily as needed for mild constipation.     simvastatin 40 MG tablet  Commonly known as:  ZOCOR  Take 40 mg by mouth at bedtime.     traMADol-acetaminophen 37.5-325 MG per tablet  Commonly known as:  ULTRACET  Take 1 tablet by mouth every 6 (six) hours as needed for moderate pain.       Allergies  Allergen Reactions  . Penicillins Rash   Follow-up Information   Follow up with [name], [name], MD On 01/31/2014. (Appointment with Dr. Selena BattenKim is 01/31/14 at 2:30pm)    Specialty:  Internal Medicine   Contact information:   [address] [place] [address] [phone]        The results of significant diagnostics from this hospitalization (including imaging, microbiology, ancillary and laboratory) are listed below for reference.    Significant Diagnostic Studies: Ct Abdomen Pelvis W Contrast  01/14/2014   CLINICAL DATA:  Abdominal pain, acute onset  EXAM: CT ABDOMEN AND PELVIS WITH CONTRAST  TECHNIQUE: Multidetector CT imaging of the abdomen and pelvis was performed using the standard protocol following bolus administration of intravenous contrast.  CONTRAST:  80mL OMNIPAQUE IOHEXOL 300 MG/ML  SOLN  COMPARISON:  None.  FINDINGS: BODY WALL: Unremarkable.  LOWER CHEST: Aortic and mitral annular calcification. Extensive coronary atherosclerosis. Bilateral subpleural reticulation.  ABDOMEN/PELVIS:  Liver: No focal abnormality.  Biliary: Cholelithiasis. There is gallbladder distention and intra and extrahepatic biliary ductal dilatation. No visible choledocholithiasis or other obstructive process.  Pancreas: Unremarkable.  Spleen: Unremarkable.  Adrenals: Unremarkable.  Kidneys and ureters: No hydronephrosis or stone.  Bladder: Unremarkable.  Reproductive: Unremarkable for age.  Bowel: Small bowel obstruction with proximal and mid small bowel loops dilated and fluid-filled. There is an  abrupt transition point in the low central abdomen where there is twisting of small bowel. Transition point is best seen in the coronal projection. There is a small knuckle of bowel in the left groin which likely reflects a developing femoral hernia. There is no proximal obstruction at this level. Extensive colonic diverticulosis. There is reactive mesenteric edema and small volume ascites. No pneumatosis or free perforation. As permitted by stool and enteric contrast, no nonenhancing segments of bowel. No pericecal inflammation.  Retroperitoneum: No mass or adenopathy.  Peritoneum: No free fluid or gas.  Vascular: No acute abnormality.  OSSEOUS: No acute abnormalities. Diffuse and advanced degenerative disc disease.  IMPRESSION: 1. High-grade distal small bowel obstruction with transition point in the low abdomen where there are changes of adhesion or small bowel volvulus. 2. Cholelithiasis and biliary dilatation. Correlate with liver function tests to evaluate for biliary obstruction. 3. Extensive colonic diverticulosis.   Electronically Signed   By: Tiburcio PeaJonathan  Watts M.D.   On: 01/14/2014 05:35   Dg Abd 2 Views  01/16/2014   CLINICAL DATA:  Small bowel obstruction and abdominal pain.  EXAM: ABDOMEN - 2 VIEW  COMPARISON:  01/14/2014 and CT 01/14/2014  FINDINGS: Upright and supine views of the abdomen were obtained. There is no evidence for  free air. There is now oral contrast in the colon. There continues to be a few gas-filled loops of small bowel but, overall, there appears to be decreased small bowel distention. Increased densities at the left lung base could represent consolidation and pleural fluid. Nasogastric tube is in the gastric fundus region. Prominent interstitial lung markings at the lung bases suggest chronic changes.  IMPRESSION: Oral contrast has advanced into the colon. There continues to be a few distended loops of small bowel. Findings may represent a partial small bowel obstruction.  Increased  densities at the left lung base are suggestive for consolidation and/or pleural fluid.  Nasogastric tube in the stomach.   Electronically Signed   By: Richarda Overlie M.D.   On: 01/16/2014 08:04   Dg Abd Acute W/chest  01/14/2014   CLINICAL DATA:  80 year old female abdominal pain. Hypertension. Initial encounter.  EXAM: ACUTE ABDOMEN SERIES (ABDOMEN 2 VIEW & CHEST 1 VIEW)  COMPARISON:  Chest radiographs 09/2013 and earlier.  FINDINGS: Portable AP semi upright view of the chest at at 0140 hr. Supine and left-side-down lateral decubitus views of the abdomen.  No pneumoperitoneum. Mildly featureless gas-filled bowel loops in the mid abdomen, but no abnormally dilated loops. Large bowel gas to the level of the splenic flexure and also in the rectum.  Interval cleared right lung base pneumonia. Stable cardiac size and mediastinal contours. Visualized tracheal air column is within normal limits. No pneumothorax or pulmonary edema. Underlying chronic increased interstitial opacity.  Scoliosis. Osteopenia. Aortoiliac calcified atherosclerosis noted. No acute osseous abnormality identified.  IMPRESSION: 1. Mildly featureless bowel loops in the mid abdomen are nonspecific and might relate to bowel inflammation. Non obstructed bowel gas pattern, no free air. 2. Clearance of the right lower lobe pneumonia seen in February. No acute cardiopulmonary abnormality.   Electronically Signed   By: Augusto Gamble M.D.   On: 01/14/2014 01:59   Dg Abd Portable 1v  01/14/2014   CLINICAL DATA:  NG tube placement  EXAM: PORTABLE ABDOMEN - 1 VIEW  COMPARISON:  01/14/2014  FINDINGS: NG tube has been advanced and projects over the stomach. Distal port is within the stomach.  IMPRESSION: NG tube in the stomach.   Electronically Signed   By: Esperanza Heir M.D.   On: 01/14/2014 20:40   Dg Abd Portable 1v  01/14/2014   CLINICAL DATA:  80 year old female NG tube placement.  EXAM: PORTABLE ABDOMEN - 1 VIEW  COMPARISON:  CT Abdomen and Pelvis  01/14/2014.  FINDINGS: Portable AP supine view at 1852 hrs. Enteric tube placed, side hole at the level of the mid to lower mediastinum. Tip of the tube is near the level of the diaphragm. Stable bowel gas pattern. Stable lung bases.  IMPRESSION: 1. Enteric tube placed, but has not yet reached the stomach. Advance 10 cm to place the side hole within the stomach. 2. Stable bowel gas pattern.   Electronically Signed   By: Augusto Gamble M.D.   On: 01/14/2014 19:06     Labs: Basic Metabolic Panel:  Recent Labs Lab 01/14/14 0055 01/15/14 0645 01/16/14 0630 01/18/14 0529  NA 144 139 142 140  K 3.2* 4.3 4.1 4.1  CL 103 104 103 108  CO2 28 25 20 23   GLUCOSE 137* 84 60* 95  BUN 21 17 22 10   CREATININE 1.01 0.86 0.91 0.83  CALCIUM 10.5 9.3 9.7 9.0  MG  --  2.3  --   --    Liver Function Tests:  Recent  Labs Lab 01/14/14 0055  AST 24  ALT 13  ALKPHOS 80  BILITOT 0.4  PROT 7.8  ALBUMIN 4.0    Recent Labs Lab 01/14/14 0055  LIPASE 33   CBC:  Recent Labs Lab 01/14/14 0055 01/15/14 0645 01/16/14 0630  WBC 6.9 6.7 7.0  NEUTROABS 4.4  --   --   HGB 13.0 11.8* 12.8  HCT 39.3 35.9* 39.3  MCV 90.3 90.4 91.0  PLT 295 256 274   Cardiac Enzymes:  Recent Labs Lab 01/16/14 0630 01/16/14 1103 01/16/14 1702  TROPONINI <0.30 <0.30 <0.30   BNP: BNP (last 3 results)  Recent Labs  10/08/13 0457  PROBNP 4063.0*    Signed:  Stephani Police, PA-C.  [facility] 01/18/2014, 4:02 PM  Patient was seen, examined,treatment plan was discussed with the Physician extender. I have directly reviewed the clinical findings, lab, imaging studies and management of this patient in detail. I have made the necessary changes to the above noted documentation, and agree with the documentation, as recorded by the Physician extender.  Windell Norfolk MD [facility].

## 2014-01-18 NOTE — Discharge Instructions (Signed)
Make sure you have a bowel movement daily.  Take Colace (ducosate sodium) every night.  Discontinue Percocet.  Narcotic pain medications contribute to bowel obstruction and severe constipation.

## 2014-07-18 ENCOUNTER — Emergency Department (HOSPITAL_COMMUNITY): Payer: Medicare HMO

## 2014-07-18 ENCOUNTER — Inpatient Hospital Stay (HOSPITAL_COMMUNITY)
Admission: EM | Admit: 2014-07-18 | Discharge: 2014-07-20 | DRG: 190 | Disposition: A | Discharge status: Home Health | Payer: Medicare HMO | Attending: Internal Medicine | Admitting: Internal Medicine

## 2014-07-18 ENCOUNTER — Encounter (HOSPITAL_COMMUNITY): Payer: Self-pay | Admitting: Emergency Medicine

## 2014-07-18 DIAGNOSIS — Z681 Body mass index (BMI) 19 or less, adult: Secondary | ICD-10-CM | POA: Diagnosis not present

## 2014-07-18 DIAGNOSIS — E43 Unspecified severe protein-calorie malnutrition: Secondary | ICD-10-CM | POA: Diagnosis present

## 2014-07-18 DIAGNOSIS — J40 Bronchitis, not specified as acute or chronic: Secondary | ICD-10-CM

## 2014-07-18 DIAGNOSIS — Z87891 Personal history of nicotine dependence: Secondary | ICD-10-CM

## 2014-07-18 DIAGNOSIS — K219 Gastro-esophageal reflux disease without esophagitis: Secondary | ICD-10-CM | POA: Diagnosis present

## 2014-07-18 DIAGNOSIS — M109 Gout, unspecified: Secondary | ICD-10-CM | POA: Diagnosis present

## 2014-07-18 DIAGNOSIS — J441 Chronic obstructive pulmonary disease with (acute) exacerbation: Principal | ICD-10-CM | POA: Diagnosis present

## 2014-07-18 DIAGNOSIS — J209 Acute bronchitis, unspecified: Secondary | ICD-10-CM | POA: Diagnosis present

## 2014-07-18 DIAGNOSIS — I10 Essential (primary) hypertension: Secondary | ICD-10-CM | POA: Diagnosis present

## 2014-07-18 DIAGNOSIS — R636 Underweight: Secondary | ICD-10-CM | POA: Diagnosis present

## 2014-07-18 DIAGNOSIS — Z79899 Other long term (current) drug therapy: Secondary | ICD-10-CM

## 2014-07-18 DIAGNOSIS — R0602 Shortness of breath: Secondary | ICD-10-CM | POA: Diagnosis present

## 2014-07-18 DIAGNOSIS — J449 Chronic obstructive pulmonary disease, unspecified: Secondary | ICD-10-CM | POA: Insufficient documentation

## 2014-07-18 LAB — CBC WITH DIFFERENTIAL/PLATELET
BASOS ABS: 0 10*3/uL (ref 0.0–0.1)
Basophils Relative: 0 % (ref 0–1)
Eosinophils Absolute: 1.5 10*3/uL — ABNORMAL HIGH (ref 0.0–0.7)
Eosinophils Relative: 17 % — ABNORMAL HIGH (ref 0–5)
HCT: 42.6 % (ref 36.0–46.0)
Hemoglobin: 14 g/dL (ref 12.0–15.0)
LYMPHS ABS: 2.8 10*3/uL (ref 0.7–4.0)
LYMPHS PCT: 32 % (ref 12–46)
MCH: 29.9 pg (ref 26.0–34.0)
MCHC: 32.9 g/dL (ref 30.0–36.0)
MCV: 91 fL (ref 78.0–100.0)
Monocytes Absolute: 0.4 10*3/uL (ref 0.1–1.0)
Monocytes Relative: 4 % (ref 3–12)
NEUTROS ABS: 4.1 10*3/uL (ref 1.7–7.7)
Neutrophils Relative %: 47 % (ref 43–77)
Platelets: 259 10*3/uL (ref 150–400)
RBC: 4.68 MIL/uL (ref 3.87–5.11)
RDW: 14.8 % (ref 11.5–15.5)
WBC: 8.7 10*3/uL (ref 4.0–10.5)

## 2014-07-18 LAB — I-STAT CHEM 8, ED
BUN: 28 mg/dL — ABNORMAL HIGH (ref 6–23)
CHLORIDE: 104 meq/L (ref 96–112)
Calcium, Ion: 1.18 mmol/L (ref 1.13–1.30)
Creatinine, Ser: 1.1 mg/dL (ref 0.50–1.10)
Glucose, Bld: 98 mg/dL (ref 70–99)
HCT: 46 % (ref 36.0–46.0)
Hemoglobin: 15.6 g/dL — ABNORMAL HIGH (ref 12.0–15.0)
POTASSIUM: 3.8 meq/L (ref 3.7–5.3)
Sodium: 138 mEq/L (ref 137–147)
TCO2: 23 mmol/L (ref 0–100)

## 2014-07-18 LAB — I-STAT TROPONIN, ED: TROPONIN I, POC: 0 ng/mL (ref 0.00–0.08)

## 2014-07-18 MED ORDER — SIMVASTATIN 40 MG PO TABS: 40.0000 mg | ORAL_TABLET | Freq: Every day | ORAL | Status: DC

## 2014-07-18 MED ORDER — AZITHROMYCIN 250 MG PO TABS
500.0000 mg | ORAL_TABLET | Freq: Once | ORAL | Status: AC
  Administered 2014-07-18: 500 mg via ORAL
  Filled 2014-07-18: qty 2

## 2014-07-18 MED ORDER — DEXTROSE 5 % IV SOLN
500.0000 mg | INTRAVENOUS | Status: DC
  Administered 2014-07-19: 500 mg via INTRAVENOUS
  Filled 2014-07-18 (×2): qty 500

## 2014-07-18 MED ORDER — LOSARTAN POTASSIUM 50 MG PO TABS
100.0000 mg | ORAL_TABLET | Freq: Every morning | ORAL | Status: DC
  Administered 2014-07-20: 100 mg via ORAL
  Filled 2014-07-18 (×2): qty 2

## 2014-07-18 MED ORDER — CEFTRIAXONE SODIUM IN DEXTROSE 20 MG/ML IV SOLN
1.0000 g | INTRAVENOUS | Status: DC
  Administered 2014-07-19: 1 g via INTRAVENOUS
  Filled 2014-07-18 (×2): qty 50

## 2014-07-18 MED ORDER — PREDNISONE 50 MG PO TABS
60.0000 mg | ORAL_TABLET | Freq: Every day | ORAL | Status: DC
  Administered 2014-07-19: 60 mg via ORAL
  Filled 2014-07-18 (×3): qty 1

## 2014-07-18 MED ORDER — ONDANSETRON HCL 4 MG/2ML IJ SOLN
4.0000 mg | Freq: Four times a day (QID) | INTRAMUSCULAR | Status: DC | PRN
Start: 2014-07-18 — End: 2014-07-20

## 2014-07-18 MED ORDER — ONDANSETRON HCL 4 MG PO TABS: 4.0000 mg | ORAL_TABLET | Freq: Four times a day (QID) | ORAL | Status: DC | PRN

## 2014-07-18 MED ORDER — ALBUTEROL SULFATE (2.5 MG/3ML) 0.083% IN NEBU
2.5000 mg | INHALATION_SOLUTION | RESPIRATORY_TRACT | Status: DC | PRN
  Administered 2014-07-20: 2.5 mg via RESPIRATORY_TRACT
  Filled 2014-07-18: qty 3

## 2014-07-18 MED ORDER — DEXTROSE 5 % IV SOLN
1.0000 g | Freq: Once | INTRAVENOUS | Status: AC
  Administered 2014-07-18: 1 g via INTRAVENOUS
  Filled 2014-07-18: qty 10

## 2014-07-18 MED ORDER — ACETAMINOPHEN 325 MG PO TABS: 650.0000 mg | ORAL_TABLET | Freq: Four times a day (QID) | ORAL | Status: DC | PRN

## 2014-07-18 MED ORDER — ALBUTEROL SULFATE (2.5 MG/3ML) 0.083% IN NEBU
5.0000 mg | INHALATION_SOLUTION | Freq: Once | RESPIRATORY_TRACT | Status: AC
  Administered 2014-07-18: 5 mg via RESPIRATORY_TRACT
  Filled 2014-07-18: qty 6

## 2014-07-18 MED ORDER — IPRATROPIUM-ALBUTEROL 0.5-2.5 (3) MG/3ML IN SOLN
3.0000 mL | Freq: Three times a day (TID) | RESPIRATORY_TRACT | Status: DC
  Administered 2014-07-19 – 2014-07-20 (×5): 3 mL via RESPIRATORY_TRACT
  Filled 2014-07-18 (×5): qty 3

## 2014-07-18 MED ORDER — IPRATROPIUM-ALBUTEROL 0.5-2.5 (3) MG/3ML IN SOLN
3.0000 mL | RESPIRATORY_TRACT | Status: DC
  Administered 2014-07-18: 3 mL via RESPIRATORY_TRACT
  Filled 2014-07-18: qty 3

## 2014-07-18 MED ORDER — ACETAMINOPHEN 650 MG RE SUPP: 650.0000 mg | Freq: Four times a day (QID) | RECTAL | Status: DC | PRN

## 2014-07-18 MED ORDER — ALLOPURINOL 150 MG HALF TABLET
450.0000 mg | ORAL_TABLET | Freq: Every day | ORAL | Status: DC
  Administered 2014-07-19 – 2014-07-20 (×2): 450 mg via ORAL
  Filled 2014-07-18 (×2): qty 1

## 2014-07-18 MED ORDER — IPRATROPIUM BROMIDE 0.02 % IN SOLN
0.5000 mg | Freq: Once | RESPIRATORY_TRACT | Status: AC
  Administered 2014-07-18: 0.5 mg via RESPIRATORY_TRACT
  Filled 2014-07-18: qty 2.5

## 2014-07-18 MED ORDER — SODIUM CHLORIDE 0.9 % IJ SOLN
3.0000 mL | Freq: Two times a day (BID) | INTRAMUSCULAR | Status: DC
  Administered 2014-07-19 – 2014-07-20 (×2): 3 mL via INTRAVENOUS

## 2014-07-18 MED ORDER — ATORVASTATIN CALCIUM 20 MG PO TABS
20.0000 mg | ORAL_TABLET | Freq: Every day | ORAL | Status: DC
  Administered 2014-07-19 (×2): 20 mg via ORAL
  Filled 2014-07-18 (×3): qty 1

## 2014-07-18 MED ORDER — PREDNISONE 20 MG PO TABS
60.0000 mg | ORAL_TABLET | Freq: Once | ORAL | Status: AC
  Administered 2014-07-18: 60 mg via ORAL
  Filled 2014-07-18: qty 3

## 2014-07-18 MED ORDER — TRAMADOL-ACETAMINOPHEN 37.5-325 MG PO TABS: 1.0000 | ORAL_TABLET | Freq: Four times a day (QID) | ORAL | Status: DC | PRN

## 2014-07-18 MED ORDER — ATENOLOL 25 MG PO TABS
25.0000 mg | ORAL_TABLET | Freq: Two times a day (BID) | ORAL | Status: DC
  Administered 2014-07-19 – 2014-07-20 (×4): 25 mg via ORAL
  Filled 2014-07-18 (×5): qty 1

## 2014-07-18 MED ORDER — AMLODIPINE BESYLATE 10 MG PO TABS
10.0000 mg | ORAL_TABLET | Freq: Every day | ORAL | Status: DC
  Administered 2014-07-19 – 2014-07-20 (×2): 10 mg via ORAL
  Filled 2014-07-18 (×2): qty 1

## 2014-07-18 MED ORDER — GUAIFENESIN ER 600 MG PO TB12
600.0000 mg | ORAL_TABLET | Freq: Two times a day (BID) | ORAL | Status: DC
Start: 2014-07-18 — End: 2014-07-20
  Administered 2014-07-19 – 2014-07-20 (×4): 600 mg via ORAL
  Filled 2014-07-18 (×5): qty 1

## 2014-07-18 MED ORDER — ENOXAPARIN SODIUM 30 MG/0.3ML ~~LOC~~ SOLN
30.0000 mg | Freq: Every day | SUBCUTANEOUS | Status: DC
  Administered 2014-07-19 (×2): 30 mg via SUBCUTANEOUS
  Filled 2014-07-18 (×3): qty 0.3

## 2014-07-18 NOTE — Progress Notes (Signed)
CSW met with [name] at bedside who says she has bronchitis, but it progressed once [place]. Patient says she feels better than when she first came in, but still feels congested in her chest.   [name] says she lives with a close long time family friend named Guy Begin in [place].  Willette Brace [phone] ED CSW 07/18/2014 9:12 PM

## 2014-07-18 NOTE — ED Notes (Signed)
Bed: WA23 Expected date:  Expected time:  Means of arrival:  Comments: EMS-SOB 

## 2014-07-18 NOTE — H&P (Signed)
[facility] History and Physical  Patient: [name]  ZOX:[id number]  DOB: 03/18/1923  DOS: the patient was seen and examined on 07/18/2014 PCP: [name], [name],Lindsay Hamming MD  Chief Complaint: Shortness of breath  HPI: [name] is a 80 y.o. female with Past medical history of COPD, hypertension, gout, GERD. The patient presented with complains of shortness of breath that has been ongoing since last 2 weeks associated with cough and wheezing. She has been taking her inhalers at home and also has taken Mucinex without any improvement in her symptoms. Neck due to progressively worsening shortness of breath as well as wheezing she decided to come to the hospital. She denies any fever or chills denies any nausea or vomiting denies any choking episode denies any sick contact. Denies any diarrhea or constipation.  The patient is coming from home. And at her baseline independent for most of her ADL.  Review of Systems: as mentioned in the history of present illness.  A Comprehensive review of the other systems is negative.  Past Medical History  Diagnosis Date  . Hypertension   . Arthritis   . Gout   . H/O hiatal hernia   . GERD (gastroesophageal reflux disease)   . Bronchitis    Past Surgical History  Procedure Laterality Date  . Appendectomy    . Tee without cardioversion N/A 10/13/2013    Procedure: TRANSESOPHAGEAL ECHOCARDIOGRAM (TEE);  Surgeon: Quintella Reichertraci R Turner, MD;  [facility];  Service: Cardiovascular;  Laterality: N/A;   Social History:  reports that she has quit smoking. She quit smokeless tobacco use about 9 years ago. She reports that she does not drink alcohol or use illicit drugs.  Allergies  Allergen Reactions  . Penicillins Rash    Family History  Problem Relation Age of Onset  . Cancer Mother   . Stroke Father     Prior to Admission medications   Medication Sig Start Date End Date Taking? Authorizing Provider  albuterol (PROVENTIL HFA;VENTOLIN  HFA) 108 (90 BASE) MCG/ACT inhaler Inhale 2 puffs into the lungs every 6 (six) hours as needed for wheezing or shortness of breath.   Yes Historical Provider, MD  allopurinol (ZYLOPRIM) 300 MG tablet Take 450 mg by mouth daily.    Yes Historical Provider, MD  amLODipine (NORVASC) 10 MG tablet Take 1 tablet by mouth daily. 06/07/14  Yes Historical Provider, MD  atenolol (TENORMIN) 25 MG tablet Take 25 mg by mouth 2 (two) times daily.   Yes Historical Provider, MD  ipratropium-albuterol (DUONEB) 0.5-2.5 (3) MG/3ML SOLN Take 3 mLs by nebulization every 6 (six) hours as needed. 01/18/14  Yes Marianne L York, PA-C  losartan (COZAAR) 100 MG tablet Take 1 tablet by mouth every morning. 06/28/14  Yes Historical Provider, MD  simvastatin (ZOCOR) 40 MG tablet Take 40 mg by mouth at bedtime.    Yes Historical Provider, MD  traMADol-acetaminophen (ULTRACET) 37.5-325 MG per tablet Take 1 tablet by mouth every 6 (six) hours as needed for moderate pain. 01/18/14  Yes Stephani PoliceMarianne L York, PA-C    Physical Exam: Filed Vitals:   07/18/14 1830 07/18/14 1848 07/18/14 2048 07/18/14 2158  BP: 132/78  98/74 132/73  Pulse: 67 82 81 77  Temp:    99.1 F (37.3 C)  TempSrc:    Oral  Resp: 19 18 18 16   Height:    5\' 5"  (1.651 m)  Weight:    49.3 kg (108 lb 11 oz)  SpO2: 98% 93% 96% 93%  General: Alert, Awake and Oriented to Time, Place and Person. Appear in mild distress Eyes: PERRL ENT: Oral Mucosa clear moist. Neck: no JVD Cardiovascular: S1 and S2 Present, no Murmur, Peripheral Pulses Present Respiratory: Bilateral Air entry equal and Decreased, occasional expiratory wheezes Abdomen: Bowel Sound present, Soft and non tender Skin: no Rash Extremities: no Pedal edema, no calf tenderness Neurologic: Grossly no focal neuro deficit.  Labs on Admission:  CBC:  Recent Labs Lab 07/18/14 1634 07/18/14 1744  WBC 8.7  --   NEUTROABS 4.1  --   HGB 14.0 15.6*  HCT 42.6 46.0  MCV 91.0  --   PLT 259  --      CMP     Component Value Date/Time   NA 138 07/18/2014 1744   K 3.8 07/18/2014 1744   CL 104 07/18/2014 1744   CO2 23 01/18/2014 0529   GLUCOSE 98 07/18/2014 1744   BUN 28* 07/18/2014 1744   CREATININE 1.10 07/18/2014 1744   CALCIUM 9.0 01/18/2014 0529   PROT 7.8 01/14/2014 0055   ALBUMIN 4.0 01/14/2014 0055   AST 24 01/14/2014 0055   ALT 13 01/14/2014 0055   ALKPHOS 80 01/14/2014 0055   BILITOT 0.4 01/14/2014 0055   GFRNONAA 61* 01/18/2014 0529   GFRAA 70* 01/18/2014 0529    No results for input(s): LIPASE, AMYLASE in the last 168 hours. No results for input(s): AMMONIA in the last 168 hours.  No results for input(s): CKTOTAL, CKMB, CKMBINDEX, TROPONINI in the last 168 hours. BNP (last 3 results)  Recent Labs  10/08/13 0457  PROBNP 4063.0*    Radiological Exams on Admission: Dg Chest 2 View  07/18/2014   CLINICAL DATA:  Shortness of breath, productive cough, weakness for 2 weeks  EXAM: CHEST  2 VIEW  COMPARISON:  Chest x-ray of 01/14/2014 and 10/07/2013  FINDINGS: The lungs remain hyperaerated consistent with emphysema with flattened hemidiaphragms and increased AP diameter. There is peribronchial thickening which may indicate bronchitis, possibly chronic. No definite pneumonia is seen. Somewhat prominent right pulmonary artery is noted which does appear to have been present previously and most likely represents pulmonary arterial hypertension. There is also soft tissue fullness in the superior mediastinum probably due to ectatic great vessels with no evidence of thyroid goiter on the prior CT. If there is concern clinically for adenopathy then CT of the chest is recommended with IV contrast media.  IMPRESSION: 1. Probable emphysema.  No definite infiltrate or effusion. 2. Peribronchial thickening most consistent with bronchitis, possibly chronic. 3. Prominent superior mediastinal soft tissues with no evidence of goiter on prior CT. Probably ectatic great vessels but cannot  exclude adenopathy. Consider CT of the chest if warranted.   Electronically Signed   By: Dwyane DeePaul  Barry M.D.   On: 07/18/2014 16:54    Assessment/Plan Principal Problem:   COPD exacerbation Active Problems:   HTN (hypertension)   Essential hypertension   Gout   1. COPD exacerbation The patient is presenting with complaints of shortness of breath and cough. Patient has been ongoing since last 2 weeks. She initially had significant wheezing and was requiring oxygen but later on has improved improved after receiving treatment in the ER. Currently we'll continue her on duo nebs, treat her with ceftriaxone and azithromycin, Place on oral prednisone with a short taper.  2. Hypertension. Continue home medication blood pressure at present stable.  3. Gout. Continue home medication.  Advance goals of care discussion: Full code   DVT Prophylaxis: subcutaneous Heparin Nutrition:  Cardiac diet  Disposition: Admitted to inpatient in telemetry unit.  Author: Lynden Oxford, MD [facility] Pager: [phone] 07/18/2014, 10:46 PM    If 7PM-7AM, please contact night-coverage [email or web address] Password [email or web address]

## 2014-07-18 NOTE — ED Notes (Signed)
Ambulated pt after neb treatment. Pt had no problem ambulating from room to bathroom, only desating to 92%. Once out of bathroom ambulated pt down hallway and back. Pt stated she began to get a little more winded, desatting to 90%. At that time pt began coughing and once done her O2 sat began to rise to 92-94% for the remainder of the walk. Pt said she felt fine to walk, but got a little winded after walking down the hallway and back.

## 2014-07-18 NOTE — ED Notes (Signed)
Pt transported to x-ray. Will obtain EKG and blood when pt returns.

## 2014-07-18 NOTE — ED Provider Notes (Addendum)
CSN: [id number]     Arrival date & time 07/18/14  1601 History   First MD Initiated Contact with Patient 07/18/14 1633     Chief Complaint  Patient presents with  . Shortness of Breath     (Consider location/radiation/quality/duration/timing/severity/associated sxs/prior Treatment) Patient is a 80 y.o. female presenting with shortness of breath. The history is provided by the patient.  Shortness of Breath Severity:  Moderate Onset quality:  Gradual Duration:  2 weeks Timing:  Constant Progression:  Worsening Chronicity:  Recurrent Context: URI   Relieved by:  Inhaler Worsened by:  Activity Ineffective treatments: otc meds for cold (mucinex) Associated symptoms: chest pain, cough, sputum production and wheezing   Associated symptoms: no abdominal pain, no fever, no sore throat and no vomiting   Associated symptoms comment:  Chest has started hurting from all the coughing Risk factors comment:  Bronchitis   Past Medical History  Diagnosis Date  . Hypertension   . Arthritis   . Gout   . H/O hiatal hernia   . GERD (gastroesophageal reflux disease)   . Bronchitis    Past Surgical History  Procedure Laterality Date  . Appendectomy    . Tee without cardioversion N/A 10/13/2013    Procedure: TRANSESOPHAGEAL ECHOCARDIOGRAM (TEE);  Surgeon: Quintella Reichertraci R Turner, MD;  [facility];  Service: Cardiovascular;  Laterality: N/A;   Family History  Problem Relation Age of Onset  . Cancer Mother   . Stroke Father    History  Substance Use Topics  . Smoking status: Former Smoker -- 0.50 packs/day for 70 years  . Smokeless tobacco: Former NeurosurgeonUser    Quit date: 10/13/2004  . Alcohol Use: No   OB History    No data available     Review of Systems  Constitutional: Negative for fever.  HENT: Negative for sore throat.   Respiratory: Positive for cough, sputum production, shortness of breath and wheezing.   Cardiovascular: Positive for chest pain.  Gastrointestinal: Negative for  vomiting and abdominal pain.  All other systems reviewed and are negative.     Allergies  Penicillins  Home Medications   Prior to Admission medications   Medication Sig Start Date End Date Taking? Authorizing Provider  albuterol (PROVENTIL HFA;VENTOLIN HFA) 108 (90 BASE) MCG/ACT inhaler Inhale 2 puffs into the lungs every 6 (six) hours as needed for wheezing or shortness of breath.   Yes Historical Provider, MD  allopurinol (ZYLOPRIM) 300 MG tablet Take 450 mg by mouth daily.    Yes Historical Provider, MD  amLODipine (NORVASC) 10 MG tablet Take 1 tablet by mouth daily. 06/07/14  Yes Historical Provider, MD  atenolol (TENORMIN) 25 MG tablet Take 25 mg by mouth 2 (two) times daily.   Yes Historical Provider, MD  ipratropium-albuterol (DUONEB) 0.5-2.5 (3) MG/3ML SOLN Take 3 mLs by nebulization every 6 (six) hours as needed. 01/18/14  Yes Marianne L York, PA-C  losartan (COZAAR) 100 MG tablet Take 1 tablet by mouth every morning. 06/28/14  Yes Historical Provider, MD  simvastatin (ZOCOR) 40 MG tablet Take 40 mg by mouth at bedtime.    Yes Historical Provider, MD  traMADol-acetaminophen (ULTRACET) 37.5-325 MG per tablet Take 1 tablet by mouth every 6 (six) hours as needed for moderate pain. 01/18/14  Yes Marianne L York, PA-C   BP 144/77 mmHg  Pulse 68  Temp(Src) 97.3 F (36.3 C) (Oral)  Resp 18  SpO2 96% Physical Exam  Constitutional: She is oriented to person, place, and time. She appears well-developed  and well-nourished. No distress.  HENT:  Head: Normocephalic and atraumatic.  Mouth/Throat: Oropharynx is clear and moist.  Eyes: Conjunctivae and EOM are normal. Pupils are equal, round, and reactive to light.  Neck: Normal range of motion. Neck supple.  Cardiovascular: Normal rate, regular rhythm and intact distal pulses.   No murmur heard. Pulmonary/Chest: Effort normal. No respiratory distress. She has wheezes. She has no rales.  Wheezing worse in the LLQ  Abdominal: Soft. She  exhibits no distension. There is no tenderness. There is no rebound and no guarding.  Musculoskeletal: Normal range of motion. She exhibits no edema or tenderness.  Neurological: She is alert and oriented to person, place, and time.  Skin: Skin is warm and dry. No rash noted. No erythema.  Psychiatric: She has a normal mood and affect. Her behavior is normal.  Nursing note and vitals reviewed.   ED Course  Procedures (including critical care time) Labs Review Labs Reviewed  CBC WITH DIFFERENTIAL - Abnormal; Notable for the following:    Eosinophils Relative 17 (*)    Eosinophils Absolute 1.5 (*)    All other components within normal limits  I-STAT CHEM 8, ED - Abnormal; Notable for the following:    BUN 28 (*)    Hemoglobin 15.6 (*)    All other components within normal limits  I-STAT TROPOININ, ED    Imaging Review Dg Chest 2 View  07/18/2014   CLINICAL DATA:  Shortness of breath, productive cough, weakness for 2 weeks  EXAM: CHEST  2 VIEW  COMPARISON:  Chest x-ray of 01/14/2014 and 10/07/2013  FINDINGS: The lungs remain hyperaerated consistent with emphysema with flattened hemidiaphragms and increased AP diameter. There is peribronchial thickening which may indicate bronchitis, possibly chronic. No definite pneumonia is seen. Somewhat prominent right pulmonary artery is noted which does appear to have been present previously and most likely represents pulmonary arterial hypertension. There is also soft tissue fullness in the superior mediastinum probably due to ectatic great vessels with no evidence of thyroid goiter on the prior CT. If there is concern clinically for adenopathy then CT of the chest is recommended with IV contrast media.  IMPRESSION: 1. Probable emphysema.  No definite infiltrate or effusion. 2. Peribronchial thickening most consistent with bronchitis, possibly chronic. 3. Prominent superior mediastinal soft tissues with no evidence of goiter on prior CT. Probably ectatic  great vessels but cannot exclude adenopathy. Consider CT of the chest if warranted.   Electronically Signed   By: Dwyane Dee M.D.   On: 07/18/2014 16:54     EKG Interpretation None      MDM   Final diagnoses:  Bronchitis  Chronic obstructive pulmonary disease, unspecified COPD, unspecified chronic bronchitis type    Patient is here with 2 weeks of worsening URI symptoms and now is having worsening shortness of breath. The only relief that she gets is from using her nebulizer. Patient denies any issues with eating or drinking and no fever.  Then using home remedies without improvement. On exam she has diffuse wheezing worse in the left lower quadrant.  Patient denies cigarette smoking.  CBC, i-STAT, troponin, EKG, chest x-ray pending. Patient given albuterol, Atrovent, prednisone.  Labs without significant findings and CXR neg for PNA.  Pt's wheezing is improved however persistent wheezing. Will give second treatment and will ambulate  8:40 PM Pt still wheezing and unable to ambulate due to feeling SOB. Will admit for further care.  Gwyneth Sprout, MD 07/18/14 1740  Gwyneth Sprout, MD 07/18/14  2040  Gwyneth SproutWhitney Kataleia Quaranta, MD 07/18/14 2100

## 2014-07-18 NOTE — ED Notes (Signed)
Per EMS pt c/o SOB, wheezing, productive cough with green sputum, was seen by PCP recently with Dx of bronchitis, used her inhaler today with mild relief. EMS administered 1 tx 5 mg albuterol, 0.5 mg atrovent.

## 2014-07-18 NOTE — Progress Notes (Signed)
Pharmacy - Rocephin  Asked to assist with Rocephin dosing. No renal-adjustment is necessary with this antibiotic.  Will order Rocephin 1g IV q24h and sign-off.   Thanks.   Charolotte Ekeom Crystalee Ventress, PharmD, pager [phone]. 07/18/2014,10:58 PM.

## 2014-07-18 NOTE — ED Notes (Signed)
Anitra LauthPlunkett, MD, at bedside. Will obtain blood when Plunkett finishes pt interview.

## 2014-07-18 NOTE — ED Notes (Signed)
Sp02 of 69% at 1608 was a mistake in charting

## 2014-07-19 ENCOUNTER — Encounter (HOSPITAL_COMMUNITY): Payer: Self-pay | Admitting: *Deleted

## 2014-07-19 LAB — COMPREHENSIVE METABOLIC PANEL
ALBUMIN: 3.8 g/dL (ref 3.5–5.2)
ALK PHOS: 87 U/L (ref 39–117)
ALT: 13 U/L (ref 0–35)
ANION GAP: 16 — AB (ref 5–15)
AST: 20 U/L (ref 0–37)
BUN: 28 mg/dL — AB (ref 6–23)
CO2: 23 mEq/L (ref 19–32)
CREATININE: 0.96 mg/dL (ref 0.50–1.10)
Calcium: 10.5 mg/dL (ref 8.4–10.5)
Chloride: 99 mEq/L (ref 96–112)
GFR calc non Af Amer: 51 mL/min — ABNORMAL LOW (ref >90)
GFR, EST AFRICAN AMERICAN: 59 mL/min — AB (ref >90)
GLUCOSE: 137 mg/dL — AB (ref 70–99)
POTASSIUM: 3.9 meq/L (ref 3.7–5.3)
Sodium: 138 mEq/L (ref 137–147)
TOTAL PROTEIN: 7.7 g/dL (ref 6.0–8.3)
Total Bilirubin: 0.4 mg/dL (ref 0.3–1.2)

## 2014-07-19 LAB — CBC
HEMATOCRIT: 40.3 % (ref 36.0–46.0)
Hemoglobin: 13 g/dL (ref 12.0–15.0)
MCH: 29.2 pg (ref 26.0–34.0)
MCHC: 32.3 g/dL (ref 30.0–36.0)
MCV: 90.6 fL (ref 78.0–100.0)
Platelets: 291 10*3/uL (ref 150–400)
RBC: 4.45 MIL/uL (ref 3.87–5.11)
RDW: 14.5 % (ref 11.5–15.5)
WBC: 4.7 10*3/uL (ref 4.0–10.5)

## 2014-07-19 LAB — PROTIME-INR
INR: 1.04 (ref 0.00–1.49)
PROTHROMBIN TIME: 13.7 s (ref 11.6–15.2)

## 2014-07-19 MED ORDER — INFLUENZA VAC SPLIT QUAD 0.5 ML IM SUSY
0.5000 mL | PREFILLED_SYRINGE | INTRAMUSCULAR | Status: AC
  Administered 2014-07-20: 0.5 mL via INTRAMUSCULAR
  Filled 2014-07-19 (×2): qty 0.5

## 2014-07-19 MED ORDER — PREDNISONE 20 MG PO TABS
40.0000 mg | ORAL_TABLET | Freq: Every day | ORAL | Status: DC
  Administered 2014-07-20: 40 mg via ORAL
  Filled 2014-07-19 (×2): qty 2

## 2014-07-19 NOTE — Progress Notes (Signed)
Progress Note   Lindsay HammingCallie B Keagy ZOX:[id number] DOB: 11/28/1922 DOA: 07/18/2014 PCP: [name], [name], MD   Brief Narrative:   Lindsay HammingCallie B [name] is an 80 y.o. female with a PMH of COPD, hypertension, gout and GERD who was admitted 07/18/14 with COPD exacerbation.  Assessment/Plan:   Principal Problem:   COPD exacerbation  Continue empiric antibiotics with ceftriaxone/azithromycin, oral prednisone on short taper, and nebulized bronchodilator treatments.  Active Problems:   HTN (hypertension)  Continue Norvasc, atenolol and losartan.    Gout  Continue allopurinol.    DVT Prophylaxis  Continue Lovenox.  Code Status: Full. Family Communication: No family at the bedside. Disposition Plan: Home when stable.   IV Access:    Peripheral IV   Procedures and diagnostic studies:   Dg Chest 2 View 07/18/2014: 1. Probable emphysema.  No definite infiltrate or effusion. 2. Peribronchial thickening most consistent with bronchitis, possibly chronic. 3. Prominent superior mediastinal soft tissues with no evidence of goiter on prior CT. Probably ectatic great vessels but cannot exclude adenopathy. Consider CT of the chest if warranted.    Medical Consultants:    None.  Anti-Infectives:    None.  Subjective:   [name] feels better.  No significant cough or wheeze. Appetite good. Feels a little bit weak.  Objective:    Filed Vitals:   07/18/14 2048 07/18/14 2158 07/19/14 0453 07/19/14 0830  BP: 98/74 132/73 124/80   Pulse: 81 77 77   Temp:  99.1 F (37.3 C) 97.8 F (36.6 C)   TempSrc:  Oral Oral   Resp: 18 16 16    Height:  5\' 5"  (1.651 m)    Weight:  49.3 kg (108 lb 11 oz)    SpO2: 96% 93% 96% 93%    Intake/Output Summary (Last 24 hours) at 07/19/14 0840 Last data filed at 07/19/14 0759  Gross per 24 hour  Intake      0 ml  Output    400 ml  Net   -400 ml    Exam: Gen:  NAD Cardiovascular:  RRR, No M/R/G Respiratory:  Lungs CTAB Gastrointestinal:   Abdomen soft, NT/ND, + BS Extremities:  No C/E/C   Data Reviewed:    Labs: Basic Metabolic Panel:  Recent Labs Lab 07/18/14 1744 07/19/14 0540  NA 138 138  K 3.8 3.9  CL 104 99  CO2  --  23  GLUCOSE 98 137*  BUN 28* 28*  CREATININE 1.10 0.96  CALCIUM  --  10.5   GFR Estimated Creatinine Clearance: 30.9 mL/min (by C-G formula based on Cr of 0.96). Liver Function Tests:  Recent Labs Lab 07/19/14 0540  AST 20  ALT 13  ALKPHOS 87  BILITOT 0.4  PROT 7.7  ALBUMIN 3.8   Coagulation profile  Recent Labs Lab 07/19/14 0540  INR 1.04    CBC:  Recent Labs Lab 07/18/14 1634 07/18/14 1744 07/19/14 0540  WBC 8.7  --  4.7  NEUTROABS 4.1  --   --   HGB 14.0 15.6* 13.0  HCT 42.6 46.0 40.3  MCV 91.0  --  90.6  PLT 259  --  291   BNP (last 3 results)  Recent Labs  10/08/13 0457  PROBNP 4063.0*   Microbiology No results found for this or any previous visit (from the past 240 hour(s)).   Medications:   . allopurinol  450 mg Oral Daily  . amLODipine  10 mg Oral Daily  . atenolol  25 mg Oral BID  .  atorvastatin  20 mg Oral QHS  . azithromycin  500 mg Intravenous Q24H  . cefTRIAXone (ROCEPHIN)  IV  1 g Intravenous Q24H  . enoxaparin (LOVENOX) injection  30 mg Subcutaneous QHS  . guaiFENesin  600 mg Oral BID  . [START ON 07/20/2014] Influenza vac split quadrivalent PF  0.5 mL Intramuscular Tomorrow-1000  . ipratropium-albuterol  3 mL Nebulization TID  . losartan  100 mg Oral q morning - 10a  . predniSONE  60 mg Oral Q breakfast  . sodium chloride  3 mL Intravenous Q12H   Continuous Infusions:   Time spent: 25 minutes.   LOS: 1 day   Rocko Fesperman  [facility] Pager [phone]. If unable to reach me by pager, please call my cell phone at [phone].  *Please refer to [email or web address], password [email or web address] to get updated schedule on who will round on this patient, as hospitalists switch teams weekly. If 7PM-7AM, please contact night-coverage at  [email or web address], password [email or web address] for any overnight needs.  07/19/2014, 8:40 AM

## 2014-07-20 DIAGNOSIS — R636 Underweight: Secondary | ICD-10-CM | POA: Diagnosis present

## 2014-07-20 DIAGNOSIS — J441 Chronic obstructive pulmonary disease with (acute) exacerbation: Secondary | ICD-10-CM | POA: Diagnosis not present

## 2014-07-20 DIAGNOSIS — E43 Unspecified severe protein-calorie malnutrition: Secondary | ICD-10-CM

## 2014-07-20 DIAGNOSIS — J209 Acute bronchitis, unspecified: Secondary | ICD-10-CM

## 2014-07-20 DIAGNOSIS — J449 Chronic obstructive pulmonary disease, unspecified: Secondary | ICD-10-CM | POA: Insufficient documentation

## 2014-07-20 MED ORDER — LEVOFLOXACIN 500 MG PO TABS: 500.0000 mg | ORAL_TABLET | Freq: Every day | ORAL | Status: DC

## 2014-07-20 MED ORDER — PREDNISONE 20 MG PO TABS: 40.0000 mg | ORAL_TABLET | Freq: Every day | ORAL | Status: DC

## 2014-07-20 MED ORDER — GUAIFENESIN-DM 100-10 MG/5ML PO SYRP
5.0000 mL | ORAL_SOLUTION | ORAL | Status: DC | PRN
  Administered 2014-07-20: 5 mL via ORAL
  Filled 2014-07-20: qty 10

## 2014-07-20 MED ORDER — VITAMINS A & D EX OINT
TOPICAL_OINTMENT | CUTANEOUS | Status: AC
  Administered 2014-07-20: 5
  Filled 2014-07-20: qty 5

## 2014-07-20 NOTE — Discharge Instructions (Signed)

## 2014-07-20 NOTE — Evaluation (Signed)
Physical Therapy One Time Evaluation Patient Details Name: Lindsay HammingCallie B Wenig MRN: [id number] DOB: 07/18/1923 Today's Date: 07/20/2014   History of Present Illness   80 y.o. female with a PMH of COPD, hypertension, gout, arthritis and GERD who was admitted 07/18/14 with COPD exacerbation  Clinical Impression  Patient evaluated by Physical Therapy with no further acute PT needs identified. All education has been completed and the patient has no further questions. Pt ambulated good distance in hallway and denies SOB, SpO2 92% during gait on room air.  Pt reports she misplaced her RW during her move and requests a replacement.  Recommended pt use RW upon d/c due to slight unsteadiness and pt reports she usually has someone at home with her to assist.  Pt appears close to baseline. See below for any follow-up Physial Therapy or equipment needs. PT is signing off. Thank you for this referral.     Follow Up Recommendations No PT follow up    Equipment Recommendations  Rolling walker with 5" wheels    Recommendations for Other Services       Precautions / Restrictions Precautions Precautions: Fall      Mobility  Bed Mobility Overal bed mobility: Modified Independent                Transfers Overall transfer level: Needs assistance Equipment used: None Transfers: Sit to/from Stand Sit to Stand: Supervision            Ambulation/Gait Ambulation/Gait assistance: Supervision;Min guard Ambulation Distance (Feet): 350 Feet Assistive device: None Gait Pattern/deviations: Step-through pattern     General Gait Details: pt a little unsteady at times however able to self correct, occasionally holding hand rail, pt reports when she moved she misplaced her RW, SpO2 92% room air during gait  Stairs            Wheelchair Mobility    Modified Rankin (Stroke Patients Only)       Balance                                             Pertinent Vitals/Pain  Pain Assessment: No/denies pain  SpO2 room air at rest 97% SpO2 room air during gait 92% Pt requested reapplying O2 [place]     Home Living Family/patient expects to be discharged to:: Private residence Living Arrangements: Non-relatives/Friends Available Help at Discharge: Family;Available PRN/intermittently;Friend(s) Type of Home: Apartment Home Access: Level entry     Home Layout: One level Home Equipment: Cane - single point Additional Comments: pt reports she has a caretaker and usually has someone present with her at home    Prior Function Level of Independence: Independent         Comments: independent with mobility per pt, assist for household chores     Hand Dominance        Extremity/Trunk Assessment               Lower Extremity Assessment: Overall WFL for tasks assessed         Communication   Communication: No difficulties  Cognition Arousal/Alertness: Awake/alert Behavior During Therapy: WFL for tasks assessed/performed Overall Cognitive Status: Within Functional Limits for tasks assessed                      General Comments      Exercises  Assessment/Plan    PT Assessment Patent does not need any further PT services  PT Diagnosis     PT Problem List    PT Treatment Interventions     PT Goals (Current goals can be found in the Care Plan section) Acute Rehab PT Goals PT Goal Formulation: All assessment and education complete, DC therapy    Frequency     Barriers to discharge        Co-evaluation               End of Session   Activity Tolerance: Patient tolerated treatment well Patient left: in bed;with call bell/phone within reach;with bed alarm set           Time: 1003-1017 PT Time Calculation (min) (ACUTE ONLY): 14 min   Charges:   PT Evaluation $Initial PT Evaluation Tier I: 1 Procedure PT Treatments $Gait Training: 8-22 mins   PT G Codes:          Konstance Happel,KATHrine E 07/20/2014, 10:34  AM Zenovia JarredKati Thaila Bottoms, PT, DPT 07/20/2014 Pager: [phone]

## 2014-07-20 NOTE — Progress Notes (Signed)
Pt. walked in the hall 200 feet without oxygen. Her O2 sat maintained at 95% for the duration. She reported feeling tired in her legs but not short of breath.

## 2014-07-20 NOTE — Discharge Summary (Addendum)
Physician Discharge Summary  Lindsay HammingCallie B Eichler [id number] DOB: 02/28/1923 DOA: 07/18/2014  PCP: [name], [name], MD  Admit date: 07/18/2014 Discharge date: 07/20/2014   Recommendations for Outpatient Follow-Up:   1. F/U with PCP as needed for non-resolution of symptoms.   Discharge Diagnosis:   Principal Problem:    COPD exacerbation secondary to acute bronchitis. Active Problems:    HTN (hypertension)    Essential hypertension    Gout    Underweight    Severe protein calorie malnutrition   Discharge Condition: Improved.  Diet recommendation: Low sodium, heart healthy.     History of Present Illness:   [name] is an 80 y.o. female with a PMH of COPD, hypertension, gout and GERD who was admitted 07/18/14 with COPD exacerbation.  Hospital Course by Problem:   Principal Problem:  COPD exacerbation secondary to acute bronchitis  Initially treated with empiric antibiotics with ceftriaxone/azithromycin, oral prednisone on short taper, and nebulized bronchodilator treatments.  D/C home on Levaquin x 5 days, and prednisone 40 mg x 1 more day.  Active Problems:   Severe protein calorie malnutrition / Underweight  Seen and evaluated by dietician.   HTN (hypertension)  Continue Norvasc, atenolol and losartan.   Gout  Continue allopurinol.    Medical Consultants:    None.   Discharge Exam:   Filed Vitals:   07/20/14 1312  BP: 140/76  Pulse: 79  Temp: 98.1 F (36.7 C)  Resp: 16   Filed Vitals:   07/20/14 0457 07/20/14 0753 07/20/14 1312 07/20/14 1505  BP: 138/66  140/76   Pulse: 73  79   Temp: 97.7 F (36.5 C)  98.1 F (36.7 C)   TempSrc: Oral  Oral   Resp: 20  16   Height:      Weight:      SpO2: 97% 97% 100% 96%    Gen:  NAD Cardiovascular:  RRR, No M/R/G Respiratory: Lungs CTAB Gastrointestinal: Abdomen soft, NT/ND with normal active bowel sounds. Extremities: No C/E/C   The results of significant diagnostics from this  hospitalization (including imaging, microbiology, ancillary and laboratory) are listed below for reference.     Procedures and Diagnostic Studies:      Dg Chest 2 View 07/18/2014: 1. Probable emphysema. No definite infiltrate or effusion. 2. Peribronchial thickening most consistent with bronchitis, possibly chronic. 3. Prominent superior mediastinal soft tissues with no evidence of goiter on prior CT. Probably ectatic great vessels but cannot exclude adenopathy. Consider CT of the chest if warranted.    Labs:   Basic Metabolic Panel:  Recent Labs Lab 07/18/14 1744 07/19/14 0540  NA 138 138  K 3.8 3.9  CL 104 99  CO2  --  23  GLUCOSE 98 137*  BUN 28* 28*  CREATININE 1.10 0.96  CALCIUM  --  10.5   GFR Estimated Creatinine Clearance: 30.9 mL/min (by C-G formula based on Cr of 0.96). Liver Function Tests:  Recent Labs Lab 07/19/14 0540  AST 20  ALT 13  ALKPHOS 87  BILITOT 0.4  PROT 7.7  ALBUMIN 3.8   Coagulation profile  Recent Labs Lab 07/19/14 0540  INR 1.04    CBC:  Recent Labs Lab 07/18/14 1634 07/18/14 1744 07/19/14 0540  WBC 8.7  --  4.7  NEUTROABS 4.1  --   --   HGB 14.0 15.6* 13.0  HCT 42.6 46.0 40.3  MCV 91.0  --  90.6  PLT 259  --  291     Discharge Instructions:  Discharge Instructions    Call MD for:  difficulty breathing, headache or visual disturbances    Complete by:  As directed      Call MD for:  extreme fatigue    Complete by:  As directed      Diet - low sodium heart healthy    Complete by:  As directed      Discharge instructions    Complete by:  As directed   You were cared for by Dr. Hillery Aldo  (a hospitalist) during your hospital stay. If you have any questions about your discharge medications or the care you received while you were in the hospital after you are discharged, you can call the unit and ask to speak with the hospitalist on call if the hospitalist that took care of you is not available. Once you are  discharged, your primary care physician will handle any further medical issues. Please note that NO REFILLS for any discharge medications will be authorized once you are discharged, as it is imperative that you return to your primary care physician (or establish a relationship with a primary care physician if you do not have one) for your aftercare needs so that they can reassess your need for medications and monitor your lab values.  Any outstanding tests can be reviewed by your PCP at your follow up visit.  It is also important to review any medicine changes with your PCP.  Please bring these d/c instructions with you to your next visit so your physician can review these changes with you.  If you do not have a primary care physician, you can call [phone] for a physician referral.  It is highly recommended that you obtain a PCP for hospital follow up.     Face-to-face encounter (required for Medicare/Medicaid patients)    Complete by:  As directed   I Reia Viernes certify that this patient is under my care and that I, or a nurse practitioner or physician's assistant working with me, had a face-to-face encounter that meets the physician face-to-face encounter requirements with this patient on 07/20/2014. The encounter with the patient was in whole, or in part for the following medical condition(s) which is the primary reason for home health care (List medical condition): Frail elderly woman who was admitted with a COPD exacerbation, home safety eval needed.  Aide to assist with ADLs.  RN to observe and teach.  The encounter with the patient was in whole, or in part, for the following medical condition, which is the primary reason for home health care:  COPD exacerbation  I certify that, based on my findings, the following services are medically necessary home health services:   NursingPhysical therapy    Reason for Medically Necessary Home Health Services:   Skilled Nursing- Skilled  Assessment/ObservationTherapy- Therapeutic Exercises to Increase Strength and Endurance    My clinical findings support the need for the above services:  Unable to leave home safely without assistance and/or assistive device  Further, I certify that my clinical findings support that this patient is homebound due to:  Unable to leave home safely without assistance     Home Health    Complete by:  As directed   To provide the following care/treatments:   [facility] Health Aide       Increase activity slowly    Complete by:  As directed             Medication List    TAKE these medications  albuterol 108 (90 BASE) MCG/ACT inhaler  Commonly known as:  PROVENTIL HFA;VENTOLIN HFA  Inhale 2 puffs into the lungs every 6 (six) hours as needed for wheezing or shortness of breath.     allopurinol 300 MG tablet  Commonly known as:  ZYLOPRIM  Take 450 mg by mouth daily.     amLODipine 10 MG tablet  Commonly known as:  NORVASC  Take 1 tablet by mouth daily.     atenolol 25 MG tablet  Commonly known as:  TENORMIN  Take 25 mg by mouth 2 (two) times daily.     ipratropium-albuterol 0.5-2.5 (3) MG/3ML Soln  Commonly known as:  DUONEB  Take 3 mLs by nebulization every 6 (six) hours as needed.     levofloxacin 500 MG tablet  Commonly known as:  LEVAQUIN  Take 1 tablet (500 mg total) by mouth daily.     losartan 100 MG tablet  Commonly known as:  COZAAR  Take 1 tablet by mouth every morning.     predniSONE 20 MG tablet  Commonly known as:  DELTASONE  Take 2 tablets (40 mg total) by mouth daily with breakfast.     simvastatin 40 MG tablet  Commonly known as:  ZOCOR  Take 40 mg by mouth at bedtime.     traMADol-acetaminophen 37.5-325 MG per tablet  Commonly known as:  ULTRACET  Take 1 tablet by mouth every 6 (six) hours as needed for moderate pain.       Follow-up Information    Follow up with [name], [name], MD. Schedule an appointment as soon as possible for a visit in 1  week.   Specialty:  Internal Medicine   Why:  Hospital follow up.   Contact information:   [address] [place] [address] [phone]        Time coordinating discharge: 25 minutes.  Signed:  Daizy Outen  Pager [phone] [facility] 07/20/2014, 3:53 PM

## 2014-07-20 NOTE — Progress Notes (Signed)
INITIAL NUTRITION ASSESSMENT  DOCUMENTATION CODES Per approved criteria  -Severe malnutrition in the context of chronic illness -Underweight  Pt meets criteria for severe MALNUTRITION in the context of chronic illness as evidenced by severe fat and muscle depletion.  INTERVENTION: -Provide Magic cup BID with meals, each supplement provides 290 kcal and 9 grams of protein -Encourage PO intake -RD to continue to monitor  NUTRITION DIAGNOSIS: Underweight related to COPD as evidenced by BMI of 18.1.   Goal: Pt to meet >/= 90% of their estimated nutrition needs   Monitor:  PO and supplemental intake, weight, labs, I/O's  Reason for Assessment: Low BMI  Admitting Dx: COPD exacerbation  ASSESSMENT: 80 y.o. female with a PMH of COPD, hypertension, gout, arthritis and GERD who was admitted 07/18/14 with COPD exacerbation  Pt reports good appetite, stating that she has never had a problem with eating. RD observed that she ate 100% of her lunch tray.  Pt with no weight loss, pt is up 5 lb since March 2015. Per previous nutrition assessments, pt with history of severe protein calorie malnutrition.   Pt states that she loves ice cream, RD to send magic cups on lunch and dinner trays.  Nutrition Focused Physical Exam:  Subcutaneous Fat:  Orbital Region: moderate depletion Upper Arm Region: severe depletion Thoracic and Lumbar Region: NA  Muscle:  Temple Region: severe depletion Clavicle Bone Region: severe depletion Clavicle and Acromion Bone Region: severe depletion Scapular Bone Region: severe depletion Dorsal Hand: severe depletion Patellar Region: severe depletion Anterior Thigh Region: severe depletion Posterior Calf Region: severe depletion  Edema: no LE edema  Labs reviewed: Elevated BUN Glucose 137  Height: Ht Readings from Last 1 Encounters:  07/18/14 5\' 5"  (1.651 m)    Weight: Wt Readings from Last 1 Encounters:  07/18/14 108 lb 11 oz (49.3 kg)     Ideal Body Weight: 125 lb  % Ideal Body Weight: 86%  Wt Readings from Last 10 Encounters:  07/18/14 108 lb 11 oz (49.3 kg)  01/14/14 103 lb 9.6 oz (46.993 kg)  11/01/13 104 lb (47.174 kg)  10/11/13 110 lb 7.2 oz (50.1 kg)  06/14/13 103 lb 6.3 oz (46.9 kg)  12/02/12 107 lb 2.3 oz (48.6 kg)    Usual Body Weight: 126 lb -per pt  % Usual Body Weight: 86%  BMI:  Body mass index is 18.09 kg/(m^2).  Estimated Nutritional Needs: Kcal: 1400-1600 Protein: 65-75g Fluid: 1.5L/day  Skin: intact  Diet Order: Diet 2 gram sodium  EDUCATION NEEDS: -Education needs addressed   Intake/Output Summary (Last 24 hours) at 07/20/14 1436 Last data filed at 07/20/14 1312  Gross per 24 hour  Intake    480 ml  Output   1200 ml  Net   -720 ml    Last BM: 12/1  Labs:   Recent Labs Lab 07/18/14 1744 07/19/14 0540  NA 138 138  K 3.8 3.9  CL 104 99  CO2  --  23  BUN 28* 28*  CREATININE 1.10 0.96  CALCIUM  --  10.5  GLUCOSE 98 137*    CBG (last 3)  No results for input(s): GLUCAP in the last 72 hours.  Scheduled Meds: . allopurinol  450 mg Oral Daily  . amLODipine  10 mg Oral Daily  . atenolol  25 mg Oral BID  . atorvastatin  20 mg Oral QHS  . azithromycin  500 mg Intravenous Q24H  . cefTRIAXone (ROCEPHIN)  IV  1 g Intravenous Q24H  . enoxaparin (  LOVENOX) injection  30 mg Subcutaneous QHS  . guaiFENesin  600 mg Oral BID  . ipratropium-albuterol  3 mL Nebulization TID  . losartan  100 mg Oral q morning - 10a  . predniSONE  40 mg Oral Q breakfast  . sodium chloride  3 mL Intravenous Q12H    Continuous Infusions:   Past Medical History  Diagnosis Date  . Hypertension   . Arthritis   . Gout   . H/O hiatal hernia   . GERD (gastroesophageal reflux disease)   . Bronchitis     Past Surgical History  Procedure Laterality Date  . Appendectomy    . Tee without cardioversion N/A 10/13/2013    Procedure: TRANSESOPHAGEAL ECHOCARDIOGRAM (TEE);  Surgeon: Quintella Reichertraci R  Turner, MD;  [facility];  Service: Cardiovascular;  Laterality: N/A;    Tilda FrancoLindsey Oluchi Pucci, MS, RD, LDN Pager: [phone] After Hours Pager: [phone]

## 2014-07-20 NOTE — Care Management Note (Signed)
CARE MANAGEMENT NOTE 07/20/2014  Patient:  [name],[name]   Account Number:  [id number]  Date Initiated:  07/20/2014  Documentation initiated by:  Sandford CrazeLEMENTS,Sheril Hammond  Subjective/Objective Assessment:   80 yo admitted with COPD.  HX of hypertension, gout, GERD     Action/Plan:   Pt plans to go home with Daughter   Anticipated DC Date:  07/20/2014   Anticipated DC Plan:  HOME W HOME HEALTH SERVICES      DC Planning Services  CM consult      [facility]  DURABLE MEDICAL EQUIPMENT   Choice offered to / List presented to:  C-1 Patient   DME arranged  WALKER - Lavone NianOLLING      [facility]     HH arranged  HH-1 RN  HH-2 PT  HH-4 NURSE'S AIDE      HH agency  [facility]   Status of service:  Completed, signed off Medicare Important Message given?   (If response is "NO", the following Medicare IM given date fields will be blank) Date Medicare IM given:   Medicare IM given by:   Date Additional Medicare IM given:   Additional Medicare IM given by:    Discharge Disposition:    Per UR Regulation:  Reviewed for med. necessity/level of care/duration of stay  If discussed at Long Length of Stay Meetings, dates discussed:    Comments:  07/20/14 Sandford Crazeora Annarae Macnair RN,BSN,NCM [phone] [facility].  Spoke with pt about DC plan.  Pt plans to go home with daugher for a short period of time. [facility] and has [facility] them in the past.  Pt states she had a walker many years ago but it got lost.  [facility].  [facility] called and given pt info and faxed order for RW with 5 inch wheels.  Pt is not qualifing for home 02 at this time.  Pts daughter is coming to transport her home with her this evening.  No other DC needs noted at this time.

## 2014-08-21 ENCOUNTER — Emergency Department (HOSPITAL_COMMUNITY): Payer: Medicare HMO

## 2014-08-21 ENCOUNTER — Emergency Department (HOSPITAL_COMMUNITY)
Admission: EM | Admit: 2014-08-21 | Discharge: 2014-08-21 | Disposition: A | Discharge status: Home / Self Care | Payer: Medicare HMO | Attending: Emergency Medicine | Admitting: Emergency Medicine

## 2014-08-21 ENCOUNTER — Encounter (HOSPITAL_COMMUNITY): Payer: Self-pay | Admitting: Emergency Medicine

## 2014-08-21 DIAGNOSIS — J441 Chronic obstructive pulmonary disease with (acute) exacerbation: Secondary | ICD-10-CM | POA: Insufficient documentation

## 2014-08-21 DIAGNOSIS — M109 Gout, unspecified: Secondary | ICD-10-CM | POA: Diagnosis not present

## 2014-08-21 DIAGNOSIS — Z79899 Other long term (current) drug therapy: Secondary | ICD-10-CM | POA: Diagnosis not present

## 2014-08-21 DIAGNOSIS — R0602 Shortness of breath: Secondary | ICD-10-CM | POA: Diagnosis present

## 2014-08-21 DIAGNOSIS — I10 Essential (primary) hypertension: Secondary | ICD-10-CM | POA: Diagnosis not present

## 2014-08-21 DIAGNOSIS — Z880 Allergy status to penicillin: Secondary | ICD-10-CM | POA: Diagnosis not present

## 2014-08-21 DIAGNOSIS — Z8719 Personal history of other diseases of the digestive system: Secondary | ICD-10-CM | POA: Insufficient documentation

## 2014-08-21 DIAGNOSIS — Z87891 Personal history of nicotine dependence: Secondary | ICD-10-CM | POA: Insufficient documentation

## 2014-08-21 LAB — CBC WITH DIFFERENTIAL/PLATELET
BASOS PCT: 1 % (ref 0–1)
Basophils Absolute: 0 10*3/uL (ref 0.0–0.1)
Eosinophils Absolute: 0.5 10*3/uL (ref 0.0–0.7)
Eosinophils Relative: 7 % — ABNORMAL HIGH (ref 0–5)
HEMATOCRIT: 37.5 % (ref 36.0–46.0)
HEMOGLOBIN: 11.9 g/dL — AB (ref 12.0–15.0)
Lymphocytes Relative: 31 % (ref 12–46)
Lymphs Abs: 2.3 10*3/uL (ref 0.7–4.0)
MCH: 28.5 pg (ref 26.0–34.0)
MCHC: 31.7 g/dL (ref 30.0–36.0)
MCV: 89.7 fL (ref 78.0–100.0)
Monocytes Absolute: 0.5 10*3/uL (ref 0.1–1.0)
Monocytes Relative: 7 % (ref 3–12)
NEUTROS PCT: 54 % (ref 43–77)
Neutro Abs: 4 10*3/uL (ref 1.7–7.7)
Platelets: 250 10*3/uL (ref 150–400)
RBC: 4.18 MIL/uL (ref 3.87–5.11)
RDW: 14.9 % (ref 11.5–15.5)
WBC: 7.3 10*3/uL (ref 4.0–10.5)

## 2014-08-21 LAB — BASIC METABOLIC PANEL
Anion gap: 8 (ref 5–15)
BUN: 16 mg/dL (ref 6–23)
CHLORIDE: 109 meq/L (ref 96–112)
CO2: 27 mmol/L (ref 19–32)
Calcium: 9.7 mg/dL (ref 8.4–10.5)
Creatinine, Ser: 1.02 mg/dL (ref 0.50–1.10)
GFR calc Af Amer: 55 mL/min — ABNORMAL LOW (ref >90)
GFR calc non Af Amer: 47 mL/min — ABNORMAL LOW (ref >90)
GLUCOSE: 119 mg/dL — AB (ref 70–99)
POTASSIUM: 3.1 mmol/L — AB (ref 3.5–5.1)
Sodium: 144 mmol/L (ref 135–145)

## 2014-08-21 MED ORDER — PREDNISONE 20 MG PO TABS
60.0000 mg | ORAL_TABLET | Freq: Once | ORAL | Status: AC
  Administered 2014-08-21: 60 mg via ORAL
  Filled 2014-08-21: qty 3

## 2014-08-21 MED ORDER — LEVOFLOXACIN 500 MG PO TABS
500.0000 mg | ORAL_TABLET | Freq: Once | ORAL | Status: AC
  Administered 2014-08-21: 500 mg via ORAL
  Filled 2014-08-21: qty 1

## 2014-08-21 MED ORDER — PREDNISONE 20 MG PO TABS: 60.0000 mg | ORAL_TABLET | Freq: Every day | ORAL | Status: DC

## 2014-08-21 MED ORDER — LEVOFLOXACIN 500 MG PO TABS: 500.0000 mg | ORAL_TABLET | Freq: Every day | ORAL | Status: DC

## 2014-08-21 NOTE — ED Notes (Signed)
While attempting to discharge pt pt stated no one available to come get her.  Upon further discussion pt stated had to leave her own home, now living "under a woman" [place] who provides housing and arranges care for her.  Pt states she won't be able to get a ride to get her medicines and that Archie Patten has her money.  When asked if she feels safe with [facility] pt states "No, not really."  Dr. Norlene Campbell made aware, social work consult ordered.

## 2014-08-21 NOTE — Discharge Instructions (Signed)
Take medications as prescribed.  Contact your doctor today for an appointment in 2-3 days.  Return to the emergency department worsening condition or new concerning symptoms.   Chronic Obstructive Pulmonary Disease Exacerbation Chronic obstructive pulmonary disease (COPD) is a common lung condition in which airflow from the lungs is limited. COPD is a general term that can be used to describe many different lung problems that limit airflow, including chronic bronchitis and emphysema. COPD exacerbations are episodes when breathing symptoms become much worse and require extra treatment. Without treatment, COPD exacerbations can be life threatening, and frequent COPD exacerbations can cause further damage to your lungs. CAUSES   Respiratory infections.   Exposure to smoke.   Exposure to air pollution, chemical fumes, or dust. Sometimes there is no apparent cause or trigger. RISK FACTORS  Smoking cigarettes.  Older age.  Frequent prior COPD exacerbations. SIGNS AND SYMPTOMS   Increased coughing.   Increased thick spit (sputum) production.   Increased wheezing.   Increased shortness of breath.   Rapid breathing.   Chest tightness. DIAGNOSIS  Your medical history, a physical exam, and tests will help your health care provider make a diagnosis. Tests may include:  A chest X-ray.  Basic lab tests.  Sputum testing.  An arterial blood gas test. TREATMENT  Depending on the severity of your COPD exacerbation, you may need to be admitted to a hospital for treatment. Some of the treatments commonly used to treat COPD exacerbations are:   Antibiotic medicines.   Bronchodilators. These are drugs that expand the air passages. They may be given with an inhaler or nebulizer. Spacer devices may be needed to help improve drug delivery.  Corticosteroid medicines.  Supplemental oxygen therapy.  HOME CARE INSTRUCTIONS   Do not smoke. Quitting smoking is very important to  prevent COPD from getting worse and exacerbations from happening as often.  Avoid exposure to all substances that irritate the airway, especially to tobacco smoke.   If you were prescribed an antibiotic medicine, finish it all even if you start to feel better.  Take all medicines as directed by your health care provider.It is important to use correct technique with inhaled medicines.  Drink enough fluids to keep your urine clear or pale yellow (unless you have a medical condition that requires fluid restriction).  Use a cool mist vaporizer. This makes it easier to clear your chest when you cough.   If you have a home nebulizer and oxygen, continue to use them as directed.   Maintain all necessary vaccinations to prevent infections.   Exercise regularly.   Eat a healthy diet.   Keep all follow-up appointments as directed by your health care provider. SEEK IMMEDIATE MEDICAL CARE IF:  You have worsening shortness of breath.   You have trouble talking.   You have severe chest pain.  You have blood in your sputum.  You have a fever.  You have weakness, vomit repeatedly, or faint.   You feel confused.   You continue to get worse. MAKE SURE YOU:   Understand these instructions.  Will watch your condition.  Will get help right away if you are not doing well or get worse. Document Released: 06/01/2007 Document Revised: 12/19/2013 Document Reviewed: 04/08/2013 [facility] Patient Information 2015 [place], [place]. This information is not intended to replace advice given to you by your health care provider. Make sure you discuss any questions you have with your health care provider.  Cough, Adult  A cough is a reflex that helps  clear your throat and airways. It can help heal the body or may be a reaction to an irritated airway. A cough may only last 2 or 3 weeks (acute) or may last more than 8 weeks (chronic).  CAUSES Acute cough:  Viral or bacterial  infections. Chronic cough:  Infections.  Allergies.  Asthma.  Post-nasal drip.  Smoking.  Heartburn or acid reflux.  Some medicines.  Chronic lung problems (COPD).  Cancer. SYMPTOMS   Cough.  Fever.  Chest pain.  Increased breathing rate.  High-pitched whistling sound when breathing (wheezing).  Colored mucus that you cough up (sputum). TREATMENT   A bacterial cough may be treated with antibiotic medicine.  A viral cough must run its course and will not respond to antibiotics.  Your caregiver may recommend other treatments if you have a chronic cough. HOME CARE INSTRUCTIONS   Only take over-the-counter or prescription medicines for pain, discomfort, or fever as directed by your caregiver. Use cough suppressants only as directed by your caregiver.  Use a cold steam vaporizer or humidifier in your bedroom or home to help loosen secretions.  Sleep in a semi-upright position if your cough is worse at night.  Rest as needed.  Stop smoking if you smoke. SEEK IMMEDIATE MEDICAL CARE IF:   You have pus in your sputum.  Your cough starts to worsen.  You cannot control your cough with suppressants and are losing sleep.  You begin coughing up blood.  You have difficulty breathing.  You develop pain which is getting worse or is uncontrolled with medicine.  You have a fever. MAKE SURE YOU:   Understand these instructions.  Will watch your condition.  Will get help right away if you are not doing well or get worse. Document Released: 01/31/2011 Document Revised: 10/27/2011 Document Reviewed: 01/31/2011 [facility] Patient Information 2015 [place], [place]. This information is not intended to replace advice given to you by your health care provider. Make sure you discuss any questions you have with your health care provider.

## 2014-08-21 NOTE — ED Provider Notes (Signed)
CSN: [id number]     Arrival date & time 08/21/14  0354 History   First MD Initiated Contact with Patient 08/21/14 [phone]     Chief Complaint  Patient presents with  . Shortness of Breath     (Consider location/radiation/quality/duration/timing/severity/associated sxs/prior Treatment) HPI 81 year old female presents to emergency department from home via EMS with complaint of shortness of breath.  Patient reports that she woke just prior to arrival and could not catch her breath.  She reports that she has had cough, but reports on a cough due to her emphysema.  She denies any fever or chills.  No change in her cough.  No chest pain.  Patient recently admitted for bronchitis.  Primary care doctor is Dr. Selena Batten  .  Patient received 2 DuoNeb same route from EMS.  At this time.  She has a somatic no cough, no shortness of breath, no chest pain, no other complaints. Past Medical History  Diagnosis Date  . Hypertension   . Arthritis   . Gout   . H/O hiatal hernia   . GERD (gastroesophageal reflux disease)   . Bronchitis    Past Surgical History  Procedure Laterality Date  . Appendectomy    . Tee without cardioversion N/A 10/13/2013    Procedure: TRANSESOPHAGEAL ECHOCARDIOGRAM (TEE);  Surgeon: Quintella Reichert, MD;  [facility] ENDOSCOPY;  Service: Cardiovascular;  Laterality: N/A;   Family History  Problem Relation Age of Onset  . Cancer Mother   . Stroke Father    History  Substance Use Topics  . Smoking status: Former Smoker -- 0.50 packs/day for 70 years  . Smokeless tobacco: Former Neurosurgeon    Quit date: 10/13/2004  . Alcohol Use: No   OB History    No data available     Review of Systems  See History of Present Illness; otherwise all other systems are reviewed and negative   Allergies  Penicillins  Home Medications   Prior to Admission medications   Medication Sig Start Date End Date Taking? Authorizing Provider  albuterol (PROVENTIL HFA;VENTOLIN HFA) 108 (90 BASE) MCG/ACT  inhaler Inhale 2 puffs into the lungs every 6 (six) hours as needed for wheezing or shortness of breath.   Yes Historical Provider, MD  allopurinol (ZYLOPRIM) 300 MG tablet Take 450 mg by mouth daily.    Yes Historical Provider, MD  amLODipine (NORVASC) 10 MG tablet Take 1 tablet by mouth daily. 06/07/14  Yes Historical Provider, MD  atenolol (TENORMIN) 25 MG tablet Take 25 mg by mouth 2 (two) times daily.   Yes Historical Provider, MD  ipratropium-albuterol (DUONEB) 0.5-2.5 (3) MG/3ML SOLN Take 3 mLs by nebulization every 6 (six) hours as needed. 01/18/14  Yes Marianne L York, PA-C  losartan (COZAAR) 100 MG tablet Take 1 tablet by mouth every morning. 06/28/14  Yes Historical Provider, MD  oxyCODONE-acetaminophen (PERCOCET/ROXICET) 5-325 MG per tablet Take 1 tablet by mouth every 6 (six) hours as needed for severe pain.   Yes Historical Provider, MD  simvastatin (ZOCOR) 40 MG tablet Take 40 mg by mouth at bedtime.    Yes Historical Provider, MD  levofloxacin (LEVAQUIN) 500 MG tablet Take 1 tablet (500 mg total) by mouth daily. 08/21/14   Olivia Mackie, MD  predniSONE (DELTASONE) 20 MG tablet Take 3 tablets (60 mg total) by mouth daily with breakfast. 08/21/14   Olivia Mackie, MD  traMADol-acetaminophen (ULTRACET) 37.5-325 MG per tablet Take 1 tablet by mouth every 6 (six) hours as needed for moderate  pain. Patient not taking: Reported on 08/21/2014 01/18/14   Tora Kindred York, PA-C   BP 131/72 mmHg  Pulse 88  Temp(Src) 97.7 F (36.5 C) (Oral)  Resp 23  Ht 5\' 5"  (1.651 m)  Wt 108 lb (48.988 kg)  BMI 17.97 kg/m2  SpO2 100% Physical Exam  Constitutional: She is oriented to person, place, and time. She appears well-developed and well-nourished. No distress.  Frail, elderly female, no acute distress.  Patient is speaking in full sentences without any dyspnea.  No cough noted  HENT:  Head: Normocephalic and atraumatic.  Nose: Nose normal.  Mouth/Throat: Oropharynx is clear and moist.  Eyes: Conjunctivae  and EOM are normal. Pupils are equal, round, and reactive to light.  Neck: Normal range of motion. Neck supple. No JVD present. No tracheal deviation present. No thyromegaly present.  Cardiovascular: Normal rate, regular rhythm, normal heart sounds and intact distal pulses.  Exam reveals no gallop and no friction rub.   No murmur heard. Pulmonary/Chest: Effort normal. No stridor. No respiratory distress. She has no wheezes. She has no rales. She exhibits no tenderness.  Patient has fine crackles in left lower lung.  Abdominal: Soft. Bowel sounds are normal. She exhibits no distension and no mass. There is no tenderness. There is no rebound and no guarding.  Musculoskeletal: Normal range of motion. She exhibits no edema or tenderness.  Lymphadenopathy:    She has no cervical adenopathy.  Neurological: She is alert and oriented to person, place, and time. She displays normal reflexes. She exhibits normal muscle tone. Coordination normal.  Skin: Skin is warm and dry. No rash noted. No erythema. No pallor.  Psychiatric: She has a normal mood and affect. Her behavior is normal. Judgment and thought content normal.  Nursing note and vitals reviewed.   ED Course  Procedures (including critical care time) Labs Review Labs Reviewed  CBC WITH DIFFERENTIAL - Abnormal; Notable for the following:    Hemoglobin 11.9 (*)    Eosinophils Relative 7 (*)    All other components within normal limits  BASIC METABOLIC PANEL - Abnormal; Notable for the following:    Potassium 3.1 (*)    Glucose, Bld 119 (*)    GFR calc non Af Amer 47 (*)    GFR calc Af Amer 55 (*)    All other components within normal limits    Imaging Review Dg Chest 2 View  08/21/2014   CLINICAL DATA:  Shortness of breath and cough for 2 weeks.  EXAM: CHEST  2 VIEW  COMPARISON:  07/18/2014  FINDINGS: Normal heart size. Prominent hilar vascularity suggesting arterial hypertension. No evidence of venous congestion. Diffuse emphysematous  changes and fibrosis in the lungs. Peribronchial thickening consistent with chronic bronchitis. Fluid or thickening in the left major fissures. No focal consolidation. Calcified and tortuous aorta. No pneumothorax.  IMPRESSION: Diffuse emphysematous changes, fibrosis, and chronic bronchitic changes in the lungs. Prominent hilar vascularity suggesting arterial hypertension. Fluid in the left major fissure. No focal consolidation.   Electronically Signed   By: Burman Nieves M.D.   On: 08/21/2014 05:22     EKG Interpretation None      MDM   Final diagnoses:  COPD exacerbation    81 year old female with shortness of breath tonight.  Possible early pneumonia on her chest x-ray in the left side.  She has not been febrile, no worsening cough.  Will start her on Levaquin and prednisone for COPD exacerbation to cover for infection.  Patient to follow-up  this week with her primary care doctor.  Patient has ambulated without drop in oxygen saturation.  Although chronically ill.  She does not appear acutely ill.  She has eaten here without difficulty.  She has been updated on findings and plan for today.   8:01 AM Upon discharge, patient relates to nursing staff that she is currently living with a lady who is helping her with money and medications.  She does not have a way home.  She does not think that she has enough money to pay for her prescriptions.  She reported to nursing staff that she did not feel safe in her current living environment.  Will have a social work consult placed.    Olivia Mackie, MD 08/21/14 [phone]

## 2014-08-21 NOTE — ED Notes (Signed)
Pt c/o SOB that started tonight. Pt states she was admitted to hospital recently for bronchitis. EMS gave 2 duonebs. Pt denies any SOB on arrival to ED.

## 2014-08-21 NOTE — ED Notes (Signed)
Ambulated pt on room air with pulse ox. Pt was 98% on RA prior to walking and maintained O2 sats >95% during ambulation test. Pt denied any SOB during/after walking. EDP notified of results.

## 2014-08-21 NOTE — Discharge Planning (Signed)
Pt currently active with [facility]  for RN services; and we will add Social Work.  Resumption of care requested.  Darlin Drop, RN of [facility] notified.  No DME needs identified at this time.

## 2014-09-18 ENCOUNTER — Emergency Department (HOSPITAL_COMMUNITY): Payer: Medicare HMO

## 2014-09-18 ENCOUNTER — Emergency Department (HOSPITAL_COMMUNITY)
Admission: EM | Admit: 2014-09-18 | Discharge: 2014-09-18 | Disposition: A | Discharge status: Home / Self Care | Payer: Medicare HMO | Attending: Emergency Medicine | Admitting: Emergency Medicine

## 2014-09-18 ENCOUNTER — Encounter (HOSPITAL_COMMUNITY): Payer: Self-pay | Admitting: Emergency Medicine

## 2014-09-18 DIAGNOSIS — Z880 Allergy status to penicillin: Secondary | ICD-10-CM | POA: Diagnosis not present

## 2014-09-18 DIAGNOSIS — R0602 Shortness of breath: Secondary | ICD-10-CM | POA: Diagnosis present

## 2014-09-18 DIAGNOSIS — J410 Simple chronic bronchitis: Secondary | ICD-10-CM | POA: Diagnosis not present

## 2014-09-18 DIAGNOSIS — Z79899 Other long term (current) drug therapy: Secondary | ICD-10-CM | POA: Insufficient documentation

## 2014-09-18 DIAGNOSIS — M109 Gout, unspecified: Secondary | ICD-10-CM | POA: Diagnosis not present

## 2014-09-18 DIAGNOSIS — Z8719 Personal history of other diseases of the digestive system: Secondary | ICD-10-CM | POA: Diagnosis not present

## 2014-09-18 DIAGNOSIS — Z87891 Personal history of nicotine dependence: Secondary | ICD-10-CM | POA: Insufficient documentation

## 2014-09-18 DIAGNOSIS — Z7952 Long term (current) use of systemic steroids: Secondary | ICD-10-CM | POA: Diagnosis not present

## 2014-09-18 DIAGNOSIS — I10 Essential (primary) hypertension: Secondary | ICD-10-CM | POA: Insufficient documentation

## 2014-09-18 DIAGNOSIS — R0682 Tachypnea, not elsewhere classified: Secondary | ICD-10-CM | POA: Diagnosis not present

## 2014-09-18 LAB — CBC
HCT: 39.5 % (ref 36.0–46.0)
HEMOGLOBIN: 12.6 g/dL (ref 12.0–15.0)
MCH: 29.2 pg (ref 26.0–34.0)
MCHC: 31.9 g/dL (ref 30.0–36.0)
MCV: 91.4 fL (ref 78.0–100.0)
Platelets: 290 10*3/uL (ref 150–400)
RBC: 4.32 MIL/uL (ref 3.87–5.11)
RDW: 14.8 % (ref 11.5–15.5)
WBC: 7.3 10*3/uL (ref 4.0–10.5)

## 2014-09-18 LAB — BASIC METABOLIC PANEL
ANION GAP: 11 (ref 5–15)
BUN: 24 mg/dL — ABNORMAL HIGH (ref 6–23)
CO2: 23 mmol/L (ref 19–32)
Calcium: 9.9 mg/dL (ref 8.4–10.5)
Chloride: 108 mmol/L (ref 96–112)
Creatinine, Ser: 1.05 mg/dL (ref 0.50–1.10)
GFR calc Af Amer: 53 mL/min — ABNORMAL LOW (ref >90)
GFR calc non Af Amer: 46 mL/min — ABNORMAL LOW (ref >90)
Glucose, Bld: 86 mg/dL (ref 70–99)
Potassium: 4.1 mmol/L (ref 3.5–5.1)
Sodium: 142 mmol/L (ref 135–145)

## 2014-09-18 LAB — BRAIN NATRIURETIC PEPTIDE: B Natriuretic Peptide: 51.6 pg/mL (ref 0.0–100.0)

## 2014-09-18 LAB — D-DIMER, QUANTITATIVE (NOT AT ARMC): D-Dimer, Quant: 0.68 ug/mL-FEU — ABNORMAL HIGH (ref 0.00–0.48)

## 2014-09-18 MED ORDER — IPRATROPIUM BROMIDE 0.02 % IN SOLN: 0.5000 mg | Freq: Four times a day (QID) | RESPIRATORY_TRACT | Status: DC

## 2014-09-18 MED ORDER — IPRATROPIUM-ALBUTEROL 0.5-2.5 (3) MG/3ML IN SOLN
3.0000 mL | RESPIRATORY_TRACT | Status: AC
  Administered 2014-09-18 (×3): 3 mL via RESPIRATORY_TRACT
  Filled 2014-09-18 (×3): qty 3

## 2014-09-18 NOTE — ED Notes (Signed)
Pt taught how to use nebulizer by represenattive.

## 2014-09-18 NOTE — Progress Notes (Signed)
09/18/14 5:59 W. Stann Mainland RN BSN NCM [phone] Home nebulizer delivered to patient at bedside. No further ED CM needs identified.   09/18/14 at 4:39 W. Stann Mainland RN BSN, [place] 336 [phone] ED CM consulted concerning home nebulizer machine. Patient presented to [facility] ED today with SOB, PMH COPD. Patient has had 2 ED visits and 1 hospitilizations in the past 6 months. [employer]/ PCP Dr. Maudie Mercury. Met with patient at bedside and confirmed information, discussed the recommendations for home nebulizer machine. Patient is agreeable with recommendations, Offered choice DME list provided, patient selected [facility] [employer] [facility] from [facility] participating DME agency. Referral faxed and called in to [place], for equipment to be delivered to [facility] ED for discharge.  Discussed the discharge plan with [facility] and Dr. Mariam Dollar they are agreeable. Updated patient that ETD to ED should be within 30 mins as per [facility] Rep. Awaiting the delivery of the equipment for discharge.

## 2014-09-18 NOTE — Discharge Instructions (Signed)

## 2014-09-18 NOTE — ED Provider Notes (Signed)
CSN: [id number]     Arrival date & time 09/18/14  1132 History   First MD Initiated Contact with Patient 09/18/14 1152     Chief Complaint  Patient presents with  . Shortness of Breath     (Consider location/radiation/quality/duration/timing/severity/associated sxs/prior Treatment) Patient is a 81 y.o. female presenting with shortness of breath. The history is provided by the patient. No language interpreter was used.  Shortness of Breath Severity:  Moderate Onset quality:  Gradual Duration:  1 month Timing:  Constant Progression:  Worsening Chronicity:  New Context: not activity, not emotional upset, not known allergens, not pollens and not URI   Relieved by:  Nothing Worsened by:  Nothing tried Ineffective treatments:  Inhaler (Z-pak and prednisone) Associated symptoms: cough, sputum production and wheezing   Associated symptoms: no abdominal pain, no chest pain, no fever, no neck pain, no sore throat and no vomiting   Cough:    Cough characteristics:  Productive   Sputum characteristics:  White   Severity:  Moderate   Onset quality:  Gradual   Timing:  Constant   Progression:  Unchanged   Chronicity:  New Wheezing:    Severity:  Moderate   Onset quality:  Gradual   Timing:  Constant   Progression:  Unchanged Risk factors: no recent alcohol use, no hx of cancer, no hx of PE/DVT and no obesity     Past Medical History  Diagnosis Date  . Hypertension   . Arthritis   . Gout   . H/O hiatal hernia   . GERD (gastroesophageal reflux disease)   . Bronchitis    Past Surgical History  Procedure Laterality Date  . Appendectomy    . Tee without cardioversion N/A 10/13/2013    Procedure: TRANSESOPHAGEAL ECHOCARDIOGRAM (TEE);  Surgeon: Quintella Reichert, MD;  Location: [facility] ENDOSCOPY;  Service: Cardiovascular;  Laterality: N/A;   Family History  Problem Relation Age of Onset  . Cancer Mother   . Stroke Father    History  Substance Use Topics  . Smoking status: Former Smoker  -- 0.50 packs/day for 70 years  . Smokeless tobacco: Former Neurosurgeon    Quit date: 10/13/2004  . Alcohol Use: No   OB History    No data available     Review of Systems  Constitutional: Negative for fever.  HENT: Negative for sore throat.   Respiratory: Positive for cough, sputum production, shortness of breath and wheezing.   Cardiovascular: Negative for chest pain and leg swelling.  Gastrointestinal: Negative for vomiting and abdominal pain.  Genitourinary: Negative for dysuria, urgency and frequency.  Musculoskeletal: Negative for neck pain.  All other systems reviewed and are negative.     Allergies  Penicillins  Home Medications   Prior to Admission medications   Medication Sig Start Date End Date Taking? Authorizing Provider  albuterol (PROVENTIL HFA;VENTOLIN HFA) 108 (90 BASE) MCG/ACT inhaler Inhale 2 puffs into the lungs every 6 (six) hours as needed for wheezing or shortness of breath.   Yes Historical Provider, MD  allopurinol (ZYLOPRIM) 300 MG tablet Take 450 mg by mouth daily.    Yes Historical Provider, MD  amLODipine (NORVASC) 10 MG tablet Take 1 tablet by mouth daily. 06/07/14  Yes Historical Provider, MD  atenolol (TENORMIN) 25 MG tablet Take 25 mg by mouth 2 (two) times daily.   Yes Historical Provider, MD  ipratropium-albuterol (DUONEB) 0.5-2.5 (3) MG/3ML SOLN Take 3 mLs by nebulization every 6 (six) hours as needed. Patient taking differently: Take 3 mLs  by nebulization every 6 (six) hours as needed. For shortness of breath 01/18/14  Yes Tora KindredMarianne L York, PA-C  losartan (COZAAR) 100 MG tablet Take 1 tablet by mouth every morning. 06/28/14  Yes Historical Provider, MD  oxyCODONE-acetaminophen (PERCOCET/ROXICET) 5-325 MG per tablet Take 1 tablet by mouth every 6 (six) hours as needed for severe pain.   Yes Historical Provider, MD  predniSONE (DELTASONE) 20 MG tablet Take 3 tablets (60 mg total) by mouth daily with breakfast. 08/21/14  Yes Olivia Mackielga M Otter, MD  simvastatin  (ZOCOR) 40 MG tablet Take 40 mg by mouth at bedtime.    Yes Historical Provider, MD  ipratropium (ATROVENT) 0.02 % nebulizer solution Take 2.5 mLs (0.5 mg total) by nebulization 4 (four) times daily. 09/18/14   Bethann BerkshireAaron Aiyanah Kalama, MD  levofloxacin (LEVAQUIN) 500 MG tablet Take 1 tablet (500 mg total) by mouth daily. Patient not taking: Reported on 09/18/2014 08/21/14   Olivia Mackielga M Otter, MD  traMADol-acetaminophen (ULTRACET) 37.5-325 MG per tablet Take 1 tablet by mouth every 6 (six) hours as needed for moderate pain. Patient not taking: Reported on 08/21/2014 01/18/14   Tora KindredMarianne L York, PA-C   BP 134/73 mmHg  Pulse 89  Temp(Src) 98.2 F (36.8 C) (Oral)  Resp 25  SpO2 98% Physical Exam  Constitutional: She is oriented to person, place, and time. She appears well-developed and well-nourished. No distress.  HENT:  Head: Normocephalic and atraumatic.  Eyes: Pupils are equal, round, and reactive to light.  Neck: Normal range of motion.  Cardiovascular: Normal rate, regular rhythm, normal heart sounds and intact distal pulses.   Pulmonary/Chest: Effort normal. Tachypnea noted. No respiratory distress. She has wheezes in the right upper field, the right middle field, the right lower field, the left upper field, the left middle field and the left lower field. She exhibits no tenderness.  Abdominal: Soft. Bowel sounds are normal. She exhibits no distension. There is no tenderness. There is no rebound and no guarding.  Neurological: She is alert and oriented to person, place, and time. She has normal strength. No cranial nerve deficit or sensory deficit. She exhibits normal muscle tone. Coordination and gait normal.  Skin: Skin is warm and dry.  Nursing note and vitals reviewed.   ED Course  Procedures (including critical care time) Labs Review Labs Reviewed  BASIC METABOLIC PANEL - Abnormal; Notable for the following:    BUN 24 (*)    GFR calc non Af Amer 46 (*)    GFR calc Af Amer 53 (*)    All other  components within normal limits  D-DIMER, QUANTITATIVE - Abnormal; Notable for the following:    D-Dimer, Quant 0.68 (*)    All other components within normal limits  CBC  BRAIN NATRIURETIC PEPTIDE    Imaging Review Dg Chest 2 View  09/18/2014   CLINICAL DATA:  Shortness of breath, wheezing and cough for 1 month. History of COPD.  EXAM: CHEST  2 VIEW  COMPARISON:  PA and lateral chest 08/21/2014 and 07/18/2014.  FINDINGS: The lungs are emphysematous with distortion of the pulmonary architecture identified. No consolidative process, pneumothorax or effusion. Heart size is normal. No focal bony abnormality is seen.  IMPRESSION: Emphysema without acute disease.   Electronically Signed   By: Drusilla Kannerhomas  Dalessio M.D.   On: 09/18/2014 13:13     EKG Interpretation   Date/Time:  Monday September 18 2014 11:45:54 EST Ventricular Rate:  66 PR Interval:  142 QRS Duration: 68 QT Interval:  394 QTC  Calculation: 413 R Axis:   -6 Text Interpretation:  Normal sinus rhythm Left ventricular hypertrophy new   T wave abnormality, consider lateral ischemia Confirmed by Anitra Lauth  MD,  Alphonzo Lemmings ([id number]) on 09/18/2014 11:56:55 AM      MDM   Final diagnoses:  Simple chronic bronchitis   Patient is an 81 year old Caucasian female with pertinent past medical history of COPD and frequent pneumonias who comes to the emergency department today with cough and shortness of breath for the past month. Patient has been treated with multiple rounds of steroids and antibiotics with no significant improvement in her symptoms. Patient does have difficulty utilizing her albuterol inhaler. She states that she receives better relief from nebulizer machines. Initial differential is concerning for pneumonia, PE, CHF, and COPD exacerbation. Patient does have significant wheezing on exam. She was treated with DuoNeb's in the emergency department. Patient recently completed a course of steroids a couple days ago.  As result I do not feel  that she requires a repeat course of steroids at this time. Patient's CBC was unremarkable. BMP was unremarkable. Chest x-ray demonstrated no consolidations making pneumonia unlikely particularly since the patient does not have fevers, chills, or productive cough. Her BNP was 51.6 as a result I doubt a CHF exacerbation. I have low suspicion of PE as a result a d-dimer should be sufficient to rule this out. D-dimer was 0.68 which is negative for an age adjusted d-dimer. After patient's treatment with DuoNeb's her symptoms completely resolved. She denied any shortness of breath. Repeat exam demonstrated absolutely no wheezing. As result I feel the patient is stable for discharge at this time. Patient requests admission to the hospital stating that she felt like she needed to rest more. Patient was ambulated in the ED for approximately 100 feet with no desaturations. As result I do not feel the patient requires admission. Patient's care was discussed with our social worker to obtain a nebulizer machine for her at home. Social work will attempt to have this delivered to the patient's home today. As a result I feel the patient is stable for discharge. She was instructed to return to the emergency department with worsening symptoms, fevers, chills, or any other concerns. The patient expressed understanding. She was discharged in good condition. Labs and imaging reviewed by myself and considered in medical decision making. Imaging was interpreted by Radiology. Care was discussed with my attending Dr. Anitra Lauth.      Bethann Berkshire, MD 09/18/14 1607  Gwyneth Sprout, MD 09/18/14 [phone]

## 2014-09-18 NOTE — ED Notes (Signed)
Pt reports SOB increasing over the last month. Pt denies fever. Speaking in full sentences. VSS at present.

## 2014-09-22 ENCOUNTER — Other Ambulatory Visit (HOSPITAL_COMMUNITY): Payer: Self-pay | Admitting: Radiology

## 2014-09-22 DIAGNOSIS — J449 Chronic obstructive pulmonary disease, unspecified: Secondary | ICD-10-CM

## 2014-10-01 ENCOUNTER — Encounter (HOSPITAL_COMMUNITY): Payer: Self-pay | Admitting: Physical Medicine and Rehabilitation

## 2014-10-01 ENCOUNTER — Emergency Department (HOSPITAL_COMMUNITY)
Admission: EM | Admit: 2014-10-01 | Discharge: 2014-10-01 | Disposition: A | Discharge status: Home / Self Care | Payer: Medicare HMO | Source: Home / Self Care | Attending: Emergency Medicine | Admitting: Emergency Medicine

## 2014-10-01 DIAGNOSIS — Z880 Allergy status to penicillin: Secondary | ICD-10-CM | POA: Insufficient documentation

## 2014-10-01 DIAGNOSIS — M1990 Unspecified osteoarthritis, unspecified site: Secondary | ICD-10-CM

## 2014-10-01 DIAGNOSIS — I10 Essential (primary) hypertension: Secondary | ICD-10-CM | POA: Insufficient documentation

## 2014-10-01 DIAGNOSIS — Z7982 Long term (current) use of aspirin: Secondary | ICD-10-CM

## 2014-10-01 DIAGNOSIS — Z8719 Personal history of other diseases of the digestive system: Secondary | ICD-10-CM | POA: Insufficient documentation

## 2014-10-01 DIAGNOSIS — Z79899 Other long term (current) drug therapy: Secondary | ICD-10-CM

## 2014-10-01 DIAGNOSIS — R0602 Shortness of breath: Secondary | ICD-10-CM | POA: Diagnosis not present

## 2014-10-01 DIAGNOSIS — Z760 Encounter for issue of repeat prescription: Secondary | ICD-10-CM | POA: Insufficient documentation

## 2014-10-01 DIAGNOSIS — Z87891 Personal history of nicotine dependence: Secondary | ICD-10-CM | POA: Insufficient documentation

## 2014-10-01 DIAGNOSIS — J441 Chronic obstructive pulmonary disease with (acute) exacerbation: Secondary | ICD-10-CM | POA: Diagnosis not present

## 2014-10-01 DIAGNOSIS — M109 Gout, unspecified: Secondary | ICD-10-CM | POA: Insufficient documentation

## 2014-10-01 MED ORDER — IPRATROPIUM BROMIDE 0.02 % IN SOLN: 0.5000 mg | Freq: Four times a day (QID) | RESPIRATORY_TRACT | Status: DC

## 2014-10-01 NOTE — ED Provider Notes (Signed)
CSN: [id number]     Arrival date & time 10/01/14  1535 History   This chart was scribed for Mellody Drown, PA-C, working with Doug Sou, MD by Jolene Provost, ED Scribe. This patient was seen in room TR05C/TR05C and the patient's care was started at 4:46 PM.     Chief Complaint  Patient presents with  . Medication Refill   HPI Comments: [name] is a 81 y.o. female who presents to the Emergency Department needing a medication refill of her albuterol 0.5 solution. Pt states she is out of albuterol and needs a refill. Pt states she has an inhaler at home that is also almost out. Pt states that if she does not have her medication she fills up with phlegm. She reports her inhalers at home, but states she is here for albuterol nebulizer solution because she is visiting her brother and only has 4 doses left. Pt states her PCP is Dr. Selena Batten.   The history is provided by the patient. No language interpreter was used.      Past Medical History  Diagnosis Date  . Hypertension   . Arthritis   . Gout   . H/O hiatal hernia   . GERD (gastroesophageal reflux disease)   . Bronchitis    Past Surgical History  Procedure Laterality Date  . Appendectomy    . Tee without cardioversion N/A 10/13/2013    Procedure: TRANSESOPHAGEAL ECHOCARDIOGRAM (TEE);  Surgeon: Quintella Reichert, MD;  Location: [facility] ENDOSCOPY;  Service: Cardiovascular;  Laterality: N/A;   Family History  Problem Relation Age of Onset  . Cancer Mother   . Stroke Father    History  Substance Use Topics  . Smoking status: Former Smoker -- 0.50 packs/day for 70 years  . Smokeless tobacco: Former Neurosurgeon    Quit date: 10/13/2004  . Alcohol Use: No   OB History    No data available     Review of Systems  Constitutional: Negative for fever and chills.  Respiratory: Positive for cough (Chronic). Negative for shortness of breath.   Cardiovascular: Negative for chest pain.  Gastrointestinal: Negative for abdominal pain.       Allergies  Penicillins  Home Medications   Prior to Admission medications   Medication Sig Start Date End Date Taking? Authorizing Provider  albuterol (PROVENTIL HFA;VENTOLIN HFA) 108 (90 BASE) MCG/ACT inhaler Inhale 2 puffs into the lungs every 4 (four) hours as needed for wheezing or shortness of breath.    Yes Historical Provider, MD  albuterol (PROVENTIL) (5 MG/ML) 0.5% nebulizer solution Take 2.5 mg by nebulization every 4 (four) hours.   Yes Historical Provider, MD  allopurinol (ZYLOPRIM) 300 MG tablet Take 450 mg by mouth daily.    Yes Historical Provider, MD  amLODipine (NORVASC) 10 MG tablet Take 10 mg by mouth daily.  06/07/14  Yes Historical Provider, MD  aspirin EC 325 MG tablet Take 325 mg by mouth every other day.   Yes Historical Provider, MD  atenolol (TENORMIN) 25 MG tablet Take 50 mg by mouth daily.    Yes Historical Provider, MD  atorvastatin (LIPITOR) 20 MG tablet Take 20 mg by mouth at bedtime.   Yes Historical Provider, MD  budesonide (PULMICORT) 0.5 MG/2ML nebulizer solution Take 0.5 mg by nebulization 2 (two) times daily.   Yes Historical Provider, MD  ipratropium-albuterol (DUONEB) 0.5-2.5 (3) MG/3ML SOLN Take 3 mLs by nebulization every 6 (six) hours as needed. Patient taking differently: Take 3 mLs by nebulization every 4 (four)  hours. For shortness of breath 01/18/14  Yes Tora KindredMarianne L York, PA-C  losartan (COZAAR) 100 MG tablet Take 100 mg by mouth daily.  06/28/14  Yes Historical Provider, MD  ipratropium (ATROVENT) 0.02 % nebulizer solution Take 2.5 mLs (0.5 mg total) by nebulization 4 (four) times daily. Patient not taking: Reported on 10/01/2014 09/18/14   Bethann BerkshireAaron Schmitt, MD  levofloxacin (LEVAQUIN) 500 MG tablet Take 1 tablet (500 mg total) by mouth daily. Patient not taking: Reported on 09/18/2014 08/21/14   Olivia Mackielga M Otter, MD  predniSONE (DELTASONE) 20 MG tablet Take 3 tablets (60 mg total) by mouth daily with breakfast. Patient not taking: Reported on 10/01/2014  08/21/14   Olivia Mackielga M Otter, MD  traMADol-acetaminophen (ULTRACET) 37.5-325 MG per tablet Take 1 tablet by mouth every 6 (six) hours as needed for moderate pain. Patient not taking: Reported on 08/21/2014 01/18/14   Tora KindredMarianne L York, PA-C   BP 129/70 mmHg  Pulse 91  Temp(Src) 97.6 F (36.4 C) (Oral)  Resp 20  SpO2 95% Physical Exam  Constitutional: She is oriented to person, place, and time. She appears well-developed and well-nourished. No distress.  HENT:  Head: Normocephalic and atraumatic.  Eyes: Pupils are equal, round, and reactive to light.  Neck: Neck supple.  Cardiovascular: Normal rate and regular rhythm.   Pulmonary/Chest: Effort normal. She has wheezes. She has no rales. She exhibits no tenderness.  Minimal expiratory and inspiratory wheezes. Patient is able to speak in complete sentences.   Neurological: She is alert and oriented to person, place, and time.  Skin: Skin is warm. She is not diaphoretic.  Psychiatric: She has a normal mood and affect. Her behavior is normal.  Nursing note and vitals reviewed.   ED Course  Procedures  DIAGNOSTIC STUDIES: Oxygen Saturation is 95% on RA, normal by my interpretation.    COORDINATION OF CARE: 4:54 PM Discussed treatment plan with pt at bedside and pt agreed to plan.  Labs Review Labs Reviewed - No data to display  Imaging Review No results found.   EKG Interpretation None      MDM   Final diagnoses:  Medication refill   Patient here for a medication refill patient has mild exophoria wheezing on exam. Patient denies chest pain or shortness of breath. Discussed importance of following up with her primary care for medication refill in the future. Discussed patient history, condition with Dr. Rennis ChrisJacobowitz who also evaluated the patient during this encounter. After his assessment degrees refill medication and follow-up with PCP. I personally performed the services described in this documentation, which was scribed in my presence.  The recorded information has been reviewed and is accurate.  I personally performed the services described in this documentation, which was scribed in my presence. The recorded information has been reviewed and is accurate.    Mellody DrownLauren Serigne Kubicek, PA-C 10/01/14 1802  Doug SouSam Jacubowitz, MD 10/02/14 [id number]

## 2014-10-01 NOTE — ED Notes (Signed)
Pt being discharged home with her sister in law driving. Pt in no acute distress and no pain. Pt verbalized understanding of her discharge instructions and prescription.

## 2014-10-01 NOTE — Discharge Instructions (Signed)
Call for a follow up appointment with a Family or Primary Care Provider for a medication refill in the future. Medication Refill, Emergency Department We have refilled your medication today as a courtesy to you. It is best for your medical care, however, to take care of getting refills done through your primary caregiver's office. They have your records and can do a better job of follow-up than we can in the emergency department. On maintenance medications, we often only prescribe enough medications to get you by until you are able to see your regular caregiver. This is a more expensive way to refill medications. In the future, please plan for refills so that you will not have to use the emergency department for this. Thank you for your help. Your help allows us to better take care of the daily emergencies that enter our department. Document Released: 11/21/2003 Document Revised: 10/27/2011 Document Reviewed: 11/11/2013 [facility] Patient Information 2015 [employer] [place]. This information is not intended to replace advice given to you by your health care provider. Make sure you discuss any questions you have with your health care provider.  Return if Symptoms worsen.   Take medication as prescribed.

## 2014-10-01 NOTE — ED Provider Notes (Signed)
Patient presents for refill of her albuterol. Which she takes every 4 hours on home. She is presently visiting. She did not bring her albuterol with her. Presently asymptomatic speaks in paragraphs lungs clear auscultation  Doug SouSam Claramae Rigdon, MD 10/01/14 1744

## 2014-10-01 NOTE — ED Notes (Signed)
Pt presents to department for evaluation of medication refill. States she ran out of nebulizer treatments at home. pt unsure of which medication she needs refilled. Respirations unlabored.

## 2014-10-03 ENCOUNTER — Encounter (HOSPITAL_COMMUNITY): Payer: Medicare HMO

## 2014-10-03 ENCOUNTER — Encounter (HOSPITAL_COMMUNITY): Payer: Self-pay | Admitting: Physical Medicine and Rehabilitation

## 2014-10-03 ENCOUNTER — Emergency Department (HOSPITAL_COMMUNITY): Payer: Medicare HMO

## 2014-10-03 ENCOUNTER — Inpatient Hospital Stay (HOSPITAL_COMMUNITY)
Admission: EM | Admit: 2014-10-03 | Discharge: 2014-10-06 | DRG: 190 | Disposition: A | Discharge status: Home Health | Payer: Medicare HMO | Attending: Internal Medicine | Admitting: Internal Medicine

## 2014-10-03 DIAGNOSIS — Z809 Family history of malignant neoplasm, unspecified: Secondary | ICD-10-CM

## 2014-10-03 DIAGNOSIS — Z87891 Personal history of nicotine dependence: Secondary | ICD-10-CM | POA: Diagnosis not present

## 2014-10-03 DIAGNOSIS — E785 Hyperlipidemia, unspecified: Secondary | ICD-10-CM | POA: Diagnosis present

## 2014-10-03 DIAGNOSIS — J9620 Acute and chronic respiratory failure, unspecified whether with hypoxia or hypercapnia: Secondary | ICD-10-CM | POA: Diagnosis present

## 2014-10-03 DIAGNOSIS — Z79899 Other long term (current) drug therapy: Secondary | ICD-10-CM

## 2014-10-03 DIAGNOSIS — N179 Acute kidney failure, unspecified: Secondary | ICD-10-CM | POA: Diagnosis present

## 2014-10-03 DIAGNOSIS — Z7982 Long term (current) use of aspirin: Secondary | ICD-10-CM

## 2014-10-03 DIAGNOSIS — J441 Chronic obstructive pulmonary disease with (acute) exacerbation: Secondary | ICD-10-CM | POA: Diagnosis present

## 2014-10-03 DIAGNOSIS — J9600 Acute respiratory failure, unspecified whether with hypoxia or hypercapnia: Secondary | ICD-10-CM | POA: Diagnosis present

## 2014-10-03 DIAGNOSIS — M1990 Unspecified osteoarthritis, unspecified site: Secondary | ICD-10-CM | POA: Diagnosis present

## 2014-10-03 DIAGNOSIS — K219 Gastro-esophageal reflux disease without esophagitis: Secondary | ICD-10-CM | POA: Diagnosis present

## 2014-10-03 DIAGNOSIS — Z823 Family history of stroke: Secondary | ICD-10-CM

## 2014-10-03 DIAGNOSIS — I10 Essential (primary) hypertension: Secondary | ICD-10-CM | POA: Diagnosis present

## 2014-10-03 DIAGNOSIS — R062 Wheezing: Secondary | ICD-10-CM

## 2014-10-03 DIAGNOSIS — E860 Dehydration: Secondary | ICD-10-CM | POA: Diagnosis present

## 2014-10-03 DIAGNOSIS — I5032 Chronic diastolic (congestive) heart failure: Secondary | ICD-10-CM | POA: Diagnosis present

## 2014-10-03 DIAGNOSIS — Z880 Allergy status to penicillin: Secondary | ICD-10-CM | POA: Diagnosis not present

## 2014-10-03 DIAGNOSIS — M109 Gout, unspecified: Secondary | ICD-10-CM | POA: Diagnosis present

## 2014-10-03 DIAGNOSIS — R0602 Shortness of breath: Secondary | ICD-10-CM | POA: Diagnosis present

## 2014-10-03 LAB — BASIC METABOLIC PANEL
ANION GAP: 9 (ref 5–15)
BUN: 14 mg/dL (ref 6–23)
CHLORIDE: 103 mmol/L (ref 96–112)
CO2: 26 mmol/L (ref 19–32)
Calcium: 9.7 mg/dL (ref 8.4–10.5)
Creatinine, Ser: 1.23 mg/dL — ABNORMAL HIGH (ref 0.50–1.10)
GFR calc non Af Amer: 38 mL/min — ABNORMAL LOW (ref >90)
GFR, EST AFRICAN AMERICAN: 44 mL/min — AB (ref >90)
Glucose, Bld: 91 mg/dL (ref 70–99)
Potassium: 4.5 mmol/L (ref 3.5–5.1)
Sodium: 138 mmol/L (ref 135–145)

## 2014-10-03 LAB — CBC WITH DIFFERENTIAL/PLATELET
Basophils Absolute: 0.1 10*3/uL (ref 0.0–0.1)
Basophils Relative: 1 % (ref 0–1)
Eosinophils Absolute: 1 10*3/uL — ABNORMAL HIGH (ref 0.0–0.7)
Eosinophils Relative: 11 % — ABNORMAL HIGH (ref 0–5)
HCT: 38.6 % (ref 36.0–46.0)
HEMOGLOBIN: 12.4 g/dL (ref 12.0–15.0)
LYMPHS ABS: 1.5 10*3/uL (ref 0.7–4.0)
Lymphocytes Relative: 16 % (ref 12–46)
MCH: 28.8 pg (ref 26.0–34.0)
MCHC: 32.1 g/dL (ref 30.0–36.0)
MCV: 89.6 fL (ref 78.0–100.0)
Monocytes Absolute: 0.6 10*3/uL (ref 0.1–1.0)
Monocytes Relative: 7 % (ref 3–12)
NEUTROS PCT: 65 % (ref 43–77)
Neutro Abs: 6.2 10*3/uL (ref 1.7–7.7)
PLATELETS: 281 10*3/uL (ref 150–400)
RBC: 4.31 MIL/uL (ref 3.87–5.11)
RDW: 14.8 % (ref 11.5–15.5)
WBC: 9.4 10*3/uL (ref 4.0–10.5)

## 2014-10-03 LAB — SODIUM, URINE, RANDOM: Sodium, Ur: 16 mmol/L

## 2014-10-03 LAB — BRAIN NATRIURETIC PEPTIDE: B Natriuretic Peptide: 24.5 pg/mL (ref 0.0–100.0)

## 2014-10-03 LAB — EXPECTORATED SPUTUM ASSESSMENT W GRAM STAIN, RFLX TO RESP C

## 2014-10-03 LAB — CREATININE, URINE, RANDOM: Creatinine, Urine: 24.37 mg/dL

## 2014-10-03 LAB — I-STAT TROPONIN, ED: Troponin i, poc: 0 ng/mL (ref 0.00–0.08)

## 2014-10-03 LAB — EXPECTORATED SPUTUM ASSESSMENT W REFEX TO RESP CULTURE

## 2014-10-03 MED ORDER — ASPIRIN EC 325 MG PO TBEC
325.0000 mg | DELAYED_RELEASE_TABLET | ORAL | Status: DC
  Administered 2014-10-04 – 2014-10-06 (×2): 325 mg via ORAL
  Filled 2014-10-03 (×2): qty 1

## 2014-10-03 MED ORDER — METHYLPREDNISOLONE SODIUM SUCC 125 MG IJ SOLR
60.0000 mg | Freq: Four times a day (QID) | INTRAMUSCULAR | Status: DC
  Administered 2014-10-03 – 2014-10-06 (×12): 60 mg via INTRAVENOUS
  Filled 2014-10-03 (×12): qty 0.96
  Filled 2014-10-03: qty 2
  Filled 2014-10-03 (×4): qty 0.96

## 2014-10-03 MED ORDER — BUDESONIDE 0.5 MG/2ML IN SUSP
0.5000 mg | Freq: Two times a day (BID) | RESPIRATORY_TRACT | Status: DC
  Administered 2014-10-04 – 2014-10-06 (×4): 0.5 mg via RESPIRATORY_TRACT
  Filled 2014-10-03 (×8): qty 2

## 2014-10-03 MED ORDER — ONDANSETRON HCL 4 MG PO TABS: 4.0000 mg | ORAL_TABLET | Freq: Four times a day (QID) | ORAL | Status: DC | PRN

## 2014-10-03 MED ORDER — POLYETHYLENE GLYCOL 3350 17 G PO PACK
17.0000 g | PACK | Freq: Every day | ORAL | Status: DC | PRN
  Filled 2014-10-03: qty 1

## 2014-10-03 MED ORDER — HEPARIN SODIUM (PORCINE) 5000 UNIT/ML IJ SOLN
5000.0000 [IU] | Freq: Three times a day (TID) | INTRAMUSCULAR | Status: DC
  Administered 2014-10-03 – 2014-10-06 (×8): 5000 [IU] via SUBCUTANEOUS
  Filled 2014-10-03 (×9): qty 1

## 2014-10-03 MED ORDER — ALBUTEROL SULFATE (2.5 MG/3ML) 0.083% IN NEBU
2.5000 mg | INHALATION_SOLUTION | RESPIRATORY_TRACT | Status: DC
  Administered 2014-10-03 – 2014-10-04 (×7): 2.5 mg via RESPIRATORY_TRACT
  Filled 2014-10-03 (×7): qty 3

## 2014-10-03 MED ORDER — ALBUTEROL SULFATE (2.5 MG/3ML) 0.083% IN NEBU: 2.5000 mg | INHALATION_SOLUTION | RESPIRATORY_TRACT | Status: DC

## 2014-10-03 MED ORDER — AMLODIPINE BESYLATE 10 MG PO TABS
10.0000 mg | ORAL_TABLET | Freq: Every day | ORAL | Status: DC
  Administered 2014-10-03 – 2014-10-06 (×4): 10 mg via ORAL
  Filled 2014-10-03 (×5): qty 1

## 2014-10-03 MED ORDER — HYDROCODONE-ACETAMINOPHEN 5-325 MG PO TABS
1.0000 | ORAL_TABLET | ORAL | Status: DC | PRN
  Administered 2014-10-04: 2 via ORAL
  Administered 2014-10-06 (×2): 1 via ORAL
  Filled 2014-10-03 (×2): qty 1
  Filled 2014-10-03: qty 2

## 2014-10-03 MED ORDER — AZITHROMYCIN 500 MG PO TABS
500.0000 mg | ORAL_TABLET | Freq: Every day | ORAL | Status: DC
  Administered 2014-10-03 – 2014-10-06 (×4): 500 mg via ORAL
  Filled 2014-10-03: qty 2
  Filled 2014-10-03 (×3): qty 1
  Filled 2014-10-03: qty 2

## 2014-10-03 MED ORDER — ALLOPURINOL 300 MG PO TABS
450.0000 mg | ORAL_TABLET | Freq: Every day | ORAL | Status: DC
  Administered 2014-10-03 – 2014-10-06 (×4): 450 mg via ORAL
  Filled 2014-10-03 (×5): qty 1

## 2014-10-03 MED ORDER — ATORVASTATIN CALCIUM 20 MG PO TABS
20.0000 mg | ORAL_TABLET | Freq: Every day | ORAL | Status: DC
  Administered 2014-10-03 – 2014-10-05 (×3): 20 mg via ORAL
  Filled 2014-10-03 (×5): qty 1

## 2014-10-03 MED ORDER — METHYLPREDNISOLONE SODIUM SUCC 125 MG IJ SOLR
125.0000 mg | Freq: Once | INTRAMUSCULAR | Status: AC
  Administered 2014-10-03: 125 mg via INTRAVENOUS
  Filled 2014-10-03: qty 2

## 2014-10-03 MED ORDER — IPRATROPIUM-ALBUTEROL 0.5-2.5 (3) MG/3ML IN SOLN
3.0000 mL | RESPIRATORY_TRACT | Status: DC
  Administered 2014-10-03: 3 mL via RESPIRATORY_TRACT
  Filled 2014-10-03: qty 3

## 2014-10-03 MED ORDER — ALBUTEROL SULFATE (2.5 MG/3ML) 0.083% IN NEBU
2.5000 mg | INHALATION_SOLUTION | Freq: Once | RESPIRATORY_TRACT | Status: AC
  Administered 2014-10-03: 2.5 mg via RESPIRATORY_TRACT
  Filled 2014-10-03: qty 3

## 2014-10-03 MED ORDER — ONDANSETRON HCL 4 MG/2ML IJ SOLN: 4.0000 mg | Freq: Four times a day (QID) | INTRAMUSCULAR | Status: DC | PRN

## 2014-10-03 MED ORDER — IPRATROPIUM BROMIDE 0.02 % IN SOLN
0.5000 mg | Freq: Four times a day (QID) | RESPIRATORY_TRACT | Status: DC
  Administered 2014-10-03: 0.5 mg via RESPIRATORY_TRACT
  Filled 2014-10-03: qty 2.5

## 2014-10-03 MED ORDER — ATENOLOL 50 MG PO TABS
50.0000 mg | ORAL_TABLET | Freq: Every day | ORAL | Status: DC
  Administered 2014-10-03 – 2014-10-06 (×4): 50 mg via ORAL
  Filled 2014-10-03 (×5): qty 1

## 2014-10-03 MED ORDER — SODIUM CHLORIDE 0.9 % IV SOLN
INTRAVENOUS | Status: AC
  Administered 2014-10-03 – 2014-10-04 (×4): via INTRAVENOUS

## 2014-10-03 MED ORDER — ALBUTEROL SULFATE (2.5 MG/3ML) 0.083% IN NEBU: 2.5000 mg | INHALATION_SOLUTION | RESPIRATORY_TRACT | Status: DC | PRN

## 2014-10-03 MED ORDER — GUAIFENESIN-DM 100-10 MG/5ML PO SYRP
5.0000 mL | ORAL_SOLUTION | ORAL | Status: DC | PRN
Start: 2014-10-03 — End: 2014-10-06
  Filled 2014-10-03: qty 5

## 2014-10-03 MED ORDER — SODIUM CHLORIDE 0.9 % IV SOLN: INTRAVENOUS | Status: AC

## 2014-10-03 MED ORDER — SODIUM CHLORIDE 0.9 % IJ SOLN
3.0000 mL | Freq: Two times a day (BID) | INTRAMUSCULAR | Status: DC
  Administered 2014-10-04 – 2014-10-06 (×4): 3 mL via INTRAVENOUS

## 2014-10-03 MED ORDER — ALUM & MAG HYDROXIDE-SIMETH 200-200-20 MG/5ML PO SUSP: 30.0000 mL | Freq: Four times a day (QID) | ORAL | Status: DC | PRN

## 2014-10-03 NOTE — H&P (Signed)
Patient Demographics  [name], is a 81 y.o. female  MRN: [id number]   DOB - Sep 03, 1922  Admit Date - 10/03/2014  Outpatient Primary MD for the patient is Pearson Grippe, MD   With History of -  Past Medical History  Diagnosis Date  . Hypertension   . Arthritis   . Gout   . H/O hiatal hernia   . GERD (gastroesophageal reflux disease)   . Bronchitis       Past Surgical History  Procedure Laterality Date  . Appendectomy    . Tee without cardioversion N/A 10/13/2013    Procedure: TRANSESOPHAGEAL ECHOCARDIOGRAM (TEE);  Surgeon: Quintella Reichert, MD;  Location: [facility] ENDOSCOPY;  Service: Cardiovascular;  Laterality: N/A;    in for   Chief Complaint  Patient presents with  . Shortness of Breath     HPI  [name]  is a 81 y.o. female, with history of COPD not on home oxygen, hypertension, GERD, chronic diastolic CHF baseline EF around 50-55%, comes in with 2 day history of gradually progressive shortness of breath and wheezing with some cough which is nonproductive. No fever or chills, no chest pain palpitations. No other complaints.  In the ER workup consistent with acute on chronic COPD exacerbation, dehydration with acute renal failure and I was called to admit the patient.    Review of Systems    In addition to the HPI above,  No Fever-chills, No Headache, No changes with Vision or hearing, No problems swallowing food or Liquids, No Chest pain,  No Abdominal pain, No Nausea or Vommitting, Bowel movements are regular, No Blood in stool or Urine, No dysuria, No new skin rashes or bruises, No new joints pains-aches,  No new weakness, tingling, numbness in any extremity, No recent weight gain or loss, No polyuria, polydypsia or polyphagia, No significant Mental Stressors.  A full 10 point  Review of Systems was done, except as stated above, all other Review of Systems were negative.   Social History History  Substance Use Topics  . Smoking status: Former Smoker -- 0.50 packs/day for 70 years  . Smokeless tobacco: Former Neurosurgeon    Quit date: 10/13/2004  . Alcohol Use: No      Family History Family History  Problem Relation Age of Onset  . Cancer Mother   . Stroke Father       Prior to Admission medications   Medication Sig Start Date End Date Taking? Authorizing Provider  albuterol (PROVENTIL HFA;VENTOLIN HFA) 108 (90 BASE) MCG/ACT inhaler Inhale 2 puffs into the lungs every 4 (four) hours as needed for wheezing or shortness of breath.    Yes Historical Provider, MD  albuterol (PROVENTIL) (5 MG/ML) 0.5% nebulizer solution Take 2.5 mg by nebulization every 4 (four) hours.   Yes Historical Provider, MD  allopurinol (ZYLOPRIM) 300 MG tablet Take 450 mg by mouth daily.    Yes Historical Provider, MD  amLODipine (NORVASC)  10 MG tablet Take 10 mg by mouth daily.  06/07/14  Yes Historical Provider, MD  aspirin EC 325 MG tablet Take 325 mg by mouth every other day.   Yes Historical Provider, MD  atenolol (TENORMIN) 25 MG tablet Take 50 mg by mouth daily.    Yes Historical Provider, MD  atorvastatin (LIPITOR) 20 MG tablet Take 20 mg by mouth at bedtime.   Yes Historical Provider, MD  budesonide (PULMICORT) 0.5 MG/2ML nebulizer solution Take 0.5 mg by nebulization 2 (two) times daily.   Yes Historical Provider, MD  ipratropium (ATROVENT) 0.02 % nebulizer solution Take 2.5 mLs (0.5 mg total) by nebulization 4 (four) times daily. 10/01/14  Yes Mellody DrownLauren Parker, PA-C  ipratropium-albuterol (DUONEB) 0.5-2.5 (3) MG/3ML SOLN Take 3 mLs by nebulization every 6 (six) hours as needed. Patient taking differently: Take 3 mLs by nebulization every 4 (four) hours. For shortness of breath 01/18/14  Yes Tora KindredMarianne L York, PA-C  losartan (COZAAR) 100 MG tablet Take 100 mg by mouth daily.  06/28/14  Yes  Historical Provider, MD    Allergies  Allergen Reactions  . Penicillins Rash    Tolerated ceftriaxone    Physical Exam  Vitals  Blood pressure 130/79, pulse 87, temperature 98.1 F (36.7 C), temperature source Oral, resp. rate 20, SpO2 98 %.   1. frail elderly African-American female  lying in bed in NAD,   2. Normal affect and insight, Not Suicidal or Homicidal, Awake Alert, Oriented X 3.  3. No F.N deficits, ALL C.Nerves Intact, Strength 5/5 all 4 extremities, Sensation intact all 4 extremities, Plantars down going.  4. Ears and Eyes appear Normal, Conjunctivae clear, PERRLA. Moist Oral Mucosa.  5. Supple Neck, No JVD, No cervical lymphadenopathy appriciated, No Carotid Bruits.  6. Symmetrical Chest wall movement, Good air movement bilaterallBilateral wheezing  . RRR, No Gallops, Rubs or Murmurs, No Parasternal Heave.  8. Positive Bowel Sounds, Abdomen Soft, No tenderness, No organomegaly appriciated,No rebound -guarding or rigidity.  9.  No Cyanosis, Normal Skin Turgor, No Skin Rash or Bruise.  10. Good muscle tone,  joints appear normal , no effusions, Normal ROM.  11. No Palpable Lymph Nodes in Neck or Axillae     Data Review  CBC  Recent Labs Lab 10/03/14 1140  WBC 9.4  HGB 12.4  HCT 38.6  PLT 281  MCV 89.6  MCH 28.8  MCHC 32.1  RDW 14.8  LYMPHSABS 1.5  MONOABS 0.6  EOSABS 1.0*  BASOSABS 0.1   ------------------------------------------------------------------------------------------------------------------  Chemistries   Recent Labs Lab 10/03/14 1140  NA 138  K 4.5  CL 103  CO2 26  GLUCOSE 91  BUN 14  CREATININE 1.23*  CALCIUM 9.7   ------------------------------------------------------------------------------------------------------------------ CrCl cannot be calculated (Unknown ideal weight.). ------------------------------------------------------------------------------------------------------------------ No results for  input(s): TSH, T4TOTAL, T3FREE, THYROIDAB in the last 72 hours.  Invalid input(s): FREET3   Coagulation profile No results for input(s): INR, PROTIME in the last 168 hours. ------------------------------------------------------------------------------------------------------------------- No results for input(s): DDIMER in the last 72 hours. -------------------------------------------------------------------------------------------------------------------  Cardiac Enzymes No results for input(s): CKMB, TROPONINI, MYOGLOBIN in the last 168 hours.  Invalid input(s): CK ------------------------------------------------------------------------------------------------------------------ Invalid input(s): POCBNP   ---------------------------------------------------------------------------------------------------------------  Urinalysis    Component Value Date/Time   COLORURINE YELLOW 01/14/2014 0153   APPEARANCEUR CLEAR 01/14/2014 0153   LABSPEC 1.016 01/14/2014 0153   PHURINE 6.5 01/14/2014 0153   GLUCOSEU NEGATIVE 01/14/2014 0153   HGBUR NEGATIVE 01/14/2014 0153   BILIRUBINUR NEGATIVE 01/14/2014 0153   KETONESUR 15* 01/14/2014 0153  PROTEINUR NEGATIVE 01/14/2014 0153   UROBILINOGEN 1.0 01/14/2014 0153   NITRITE NEGATIVE 01/14/2014 0153   LEUKOCYTESUR NEGATIVE 01/14/2014 0153    ----------------------------------------------------------------------------------------------------------------  Imaging results:   Dg Chest 2 View  10/03/2014   CLINICAL DATA:  Shortness of breath for several days  EXAM: CHEST  2 VIEW  COMPARISON:  09/18/2014  FINDINGS: Cardiomediastinal silhouette is stable. Stable hyperinflation and chronic interstitial prominence. Again noted bilateral infrahilar bronchitic changes right greater than left. No infiltrate or pulmonary edema. Atherosclerotic calcifications of thoracic aorta.  IMPRESSION: No active disease. Stable hyperinflation and chronic interstitial  prominence. Stable chronic infrahilar bronchitic changes right greater than left.   Electronically Signed   By: Natasha Mead M.D.   On: 10/03/2014 14:27    My personal review of EKG: Rhythm NSR, nonspecific ST changes    Assessment & Plan    1.Acute on chronic respiratory failure due to acute on chronic COPD exacerbation - admit to telemetry bed, does have productive cough with sputum Gram stain culture, place on azithromycin orally, IV steroids, nebulizer treatments scheduled and as needed, oxygen supplementation.   2. Dehydration with acute renal failure. Pain urine electrolytes, avoid nephrotoxins, hydrate repeat BMP in the morning.   3. Dyslipidemia continue home dose statin.   4.Essential hypertension. Will continue low-dose beta blocker along with Norvasc. Does not have reactive airway disease.    DVT Prophylaxis Heparin    AM Labs Ordered, also please review Full Orders  Family Communication: Admission, patients condition and plan of care including tests being ordered have been discussed with the patient  who indicates understanding and agree with the plan and Code Status.  Code Status Full  Likely DC to  Home  Condition GUARDED     Time spent in minutes : 35    SINGH,PRASHANT K M.D on 10/03/2014 at 5:21 PM  Between 7am to 7pm - Pager - [phone]  After 7pm go to [email or web address] - password [email or web address]  [facility] Office  [phone]

## 2014-10-03 NOTE — ED Provider Notes (Signed)
CSN: [id number]     Arrival date & time 10/03/14  1125 History   First MD Initiated Contact with Patient 10/03/14 1129     Chief Complaint  Patient presents with  . Shortness of Breath     (Consider location/radiation/quality/duration/timing/severity/associated sxs/prior Treatment) HPI [name] is a 81 y.o. female who is here for evaluation of shortness of breath. Patient states she was recently seen in the hospital and received medication refills of her albuterol nebulizer medications and was scheduled to have an outpatient appointment today with internal medicine. She reports her social worker was not with her today and she did not know how to get to the hospital so she called EMS. She received a breathing treatment in route for wheezing and states that made her feel better. Denies cp, sob, productive cough, hemoptysis, N/V/D/C, abd pain, dizziness or weakness, syncope, rash. Upon arrival in the ED patient reports she felt better with the nebulized albuterol she received from EMS but now she is becoming more short of breath.  Past Medical History  Diagnosis Date  . Hypertension   . Arthritis   . Gout   . H/O hiatal hernia   . GERD (gastroesophageal reflux disease)   . Bronchitis    Past Surgical History  Procedure Laterality Date  . Appendectomy    . Tee without cardioversion N/A 10/13/2013    Procedure: TRANSESOPHAGEAL ECHOCARDIOGRAM (TEE);  Surgeon: Quintella Reichert, MD;  Location: [facility] ENDOSCOPY;  Service: Cardiovascular;  Laterality: N/A;   Family History  Problem Relation Age of Onset  . Cancer Mother   . Stroke Father    History  Substance Use Topics  . Smoking status: Former Smoker -- 0.50 packs/day for 70 years  . Smokeless tobacco: Former Neurosurgeon    Quit date: 10/13/2004  . Alcohol Use: No   OB History    No data available     Review of Systems A 10 point review of systems was completed and was negative except for pertinent positives and negatives as mentioned  in the history of present illness     Allergies  Penicillins  Home Medications   Prior to Admission medications   Medication Sig Start Date End Date Taking? Authorizing Provider  albuterol (PROVENTIL HFA;VENTOLIN HFA) 108 (90 BASE) MCG/ACT inhaler Inhale 2 puffs into the lungs every 4 (four) hours as needed for wheezing or shortness of breath.    Yes Historical Provider, MD  albuterol (PROVENTIL) (5 MG/ML) 0.5% nebulizer solution Take 2.5 mg by nebulization every 4 (four) hours.   Yes Historical Provider, MD  allopurinol (ZYLOPRIM) 300 MG tablet Take 450 mg by mouth daily.    Yes Historical Provider, MD  amLODipine (NORVASC) 10 MG tablet Take 10 mg by mouth daily.  06/07/14  Yes Historical Provider, MD  aspirin EC 325 MG tablet Take 325 mg by mouth every other day.   Yes Historical Provider, MD  atenolol (TENORMIN) 25 MG tablet Take 50 mg by mouth daily.    Yes Historical Provider, MD  atorvastatin (LIPITOR) 20 MG tablet Take 20 mg by mouth at bedtime.   Yes Historical Provider, MD  budesonide (PULMICORT) 0.5 MG/2ML nebulizer solution Take 0.5 mg by nebulization 2 (two) times daily.   Yes Historical Provider, MD  ipratropium (ATROVENT) 0.02 % nebulizer solution Take 2.5 mLs (0.5 mg total) by nebulization 4 (four) times daily. 10/01/14  Yes Mellody Drown, PA-C  ipratropium-albuterol (DUONEB) 0.5-2.5 (3) MG/3ML SOLN Take 3 mLs by nebulization every 6 (six) hours  as needed. Patient taking differently: Take 3 mLs by nebulization every 4 (four) hours. For shortness of breath 01/18/14  Yes Tora KindredMarianne L York, PA-C  losartan (COZAAR) 100 MG tablet Take 100 mg by mouth daily.  06/28/14  Yes Historical Provider, MD   BP 142/93 mmHg  Pulse 84  Temp(Src) 98.2 F (36.8 C) (Oral)  Resp 20  SpO2 100% Physical Exam  Constitutional: She is oriented to person, place, and time. She appears well-developed and well-nourished.  HENT:  Head: Normocephalic and atraumatic.  Mouth/Throat: Oropharynx is clear and  moist.  Eyes: Conjunctivae are normal. Pupils are equal, round, and reactive to light. Right eye exhibits no discharge. Left eye exhibits no discharge. No scleral icterus.  Neck: Normal range of motion. Neck supple.  Cardiovascular: Normal rate, regular rhythm and normal heart sounds.   Pulmonary/Chest: Effort normal.  Diffuse wheezing on auscultation in all lung fields. No rhonchi or rales. No evidence of respiratory distress. No tachypnea. Maintain saturations above 95% on room air.  Abdominal: Soft. There is no tenderness.  Musculoskeletal: She exhibits no tenderness.  Neurological: She is alert and oriented to person, place, and time.  Cranial Nerves II-XII grossly intact  Skin: Skin is warm and dry. No rash noted.  Psychiatric: She has a normal mood and affect.  Nursing note and vitals reviewed.   ED Course  Procedures (including critical care time) Labs Review Labs Reviewed  BASIC METABOLIC PANEL - Abnormal; Notable for the following:    Creatinine, Ser 1.23 (*)    GFR calc non Af Amer 38 (*)    GFR calc Af Amer 44 (*)    All other components within normal limits  CBC WITH DIFFERENTIAL/PLATELET - Abnormal; Notable for the following:    Eosinophils Relative 11 (*)    Eosinophils Absolute 1.0 (*)    All other components within normal limits  BRAIN NATRIURETIC PEPTIDE  SODIUM, URINE, RANDOM  OSMOLALITY, URINE  OSMOLALITY  CREATININE, URINE, RANDOM  I-STAT TROPOININ, ED    Imaging Review Dg Chest 2 View  10/03/2014   CLINICAL DATA:  Shortness of breath for several days  EXAM: CHEST  2 VIEW  COMPARISON:  09/18/2014  FINDINGS: Cardiomediastinal silhouette is stable. Stable hyperinflation and chronic interstitial prominence. Again noted bilateral infrahilar bronchitic changes right greater than left. No infiltrate or pulmonary edema. Atherosclerotic calcifications of thoracic aorta.  IMPRESSION: No active disease. Stable hyperinflation and chronic interstitial prominence. Stable  chronic infrahilar bronchitic changes right greater than left.   Electronically Signed   By: Natasha MeadLiviu  Pop M.D.   On: 10/03/2014 14:27     EKG Interpretation   Date/Time:  Tuesday October 03 2014 11:34:32 EST Ventricular Rate:  86 PR Interval:  165 QRS Duration: 75 QT Interval:  356 QTC Calculation: 426 R Axis:   8 Text Interpretation:  Sinus rhythm No ST changes. Inverted T waves III,  noted previously Confirmed by Fayrene FearingJAMES  MD, MARK ([id number]) on 10/03/2014  11:43:04 AM     Meds given in ED:  Medications  methylPREDNISolone sodium succinate (SOLU-MEDROL) 125 mg/2 mL injection 60 mg (not administered)  0.9 %  sodium chloride infusion (not administered)  azithromycin (ZITHROMAX) tablet 500 mg (not administered)  methylPREDNISolone sodium succinate (SOLU-MEDROL) 125 mg/2 mL injection 125 mg (125 mg Intravenous Given 10/03/14 1531)  albuterol (PROVENTIL) (2.5 MG/3ML) 0.083% nebulizer solution 2.5 mg (2.5 mg Nebulization Given 10/03/14 1520)    New Prescriptions   No medications on file   Filed Vitals:   10/03/14  1430 10/03/14 1500 10/03/14 1521 10/03/14 1530  BP: 135/77 165/81  142/93  Pulse: 85 92  84  Temp:      TempSrc:      Resp: SpO2: 99% 98% 98% 100%    MDM  Vitals stable - WNL -afebrile PE--diffuse persistent wheezing despite 3 breathing treatments and Solu-Medrol given in ED.  Labwork noncontributory, troponin negative Imaging--CXR unchanged.  Patient ambulates to the bathroom on room air and desats to 90%. Patient with wheezing likely secondary to COPD exacerbation. Denies chest pain at this time low concern for PE, pneumonia. No productive cough, fevers. Discussed patient presentation and ED course with attending, Dr. Fayrene Fearing. Decision made to consult internal medicine for admission of COPD exacerbation. Dr. Thedore Mins to see in ED.  Prior to patient admission, I discussed and reviewed this case with Dr. Fayrene Fearing    Final diagnoses:  Wheeze        Sharlene Motts, PA-C 10/04/14 1733  Rolland Porter, MD 10/11/14 [phone]

## 2014-10-03 NOTE — ED Notes (Signed)
[facility]. Ongoing x3 months. Pt becomes short of breath on exertion. She is alert and oriented x4. Received 5mg  Albuterol per EMS.

## 2014-10-03 NOTE — ED Notes (Signed)
Attempted to call report, nurse to call back. 

## 2014-10-03 NOTE — ED Notes (Addendum)
Patient stated to talk to Archie Pattenonya [phone] who patient lives with. [place]oke with [place] who did stated does live patient and when she is at work another person stays with patient. States will pick up patient if discharged.  Patient stated was [place] at [place] and [place] stated because there were clothes outside since August and patient wanted to wear them.  Clothes in big plastic bag at bedside.  Patient requested to be admitted to rest for 2-3 days and feels better. Patient brought to the hospital by patient's brother today. Provider notified.

## 2014-10-03 NOTE — ED Notes (Signed)
Patient transported to X-ray 

## 2014-10-03 NOTE — ED Notes (Signed)
Patient walk to bathroom, Patient got very short winded, Nurse was informed. 02 was checked once returned back to room it was 90% on room air.

## 2014-10-03 NOTE — ED Notes (Signed)
Respirations unlabored, speaking complete sentences, expiratory wheezing noted all lung fields. Pt denies pain.

## 2014-10-03 NOTE — ED Provider Notes (Signed)
Patient seen and evaluated. He had initial relief with nebulized albuterol. On recheck she is dyspneic the point of 2-3 words during conversation has recurrent wheezing. Audible across a room. Prolonged on exam. Given IV Solu-Medrol the second neb here has some improvement continues and advised albuterol. Does not feel improvement to where she feels comfortable with discharge. I agree with admission.  Rolland PorterMark Osmel Dykstra, MD 10/03/14 [phone]

## 2014-10-04 DIAGNOSIS — R0602 Shortness of breath: Secondary | ICD-10-CM

## 2014-10-04 DIAGNOSIS — J441 Chronic obstructive pulmonary disease with (acute) exacerbation: Principal | ICD-10-CM

## 2014-10-04 DIAGNOSIS — J9601 Acute respiratory failure with hypoxia: Secondary | ICD-10-CM

## 2014-10-04 DIAGNOSIS — N179 Acute kidney failure, unspecified: Secondary | ICD-10-CM

## 2014-10-04 DIAGNOSIS — I10 Essential (primary) hypertension: Secondary | ICD-10-CM

## 2014-10-04 LAB — CBC
HCT: 40.3 % (ref 36.0–46.0)
HEMOGLOBIN: 13.2 g/dL (ref 12.0–15.0)
MCH: 29.7 pg (ref 26.0–34.0)
MCHC: 32.8 g/dL (ref 30.0–36.0)
MCV: 90.6 fL (ref 78.0–100.0)
Platelets: 259 10*3/uL (ref 150–400)
RBC: 4.45 MIL/uL (ref 3.87–5.11)
RDW: 14.4 % (ref 11.5–15.5)
WBC: 6 10*3/uL (ref 4.0–10.5)

## 2014-10-04 LAB — BASIC METABOLIC PANEL
ANION GAP: 10 (ref 5–15)
BUN: 16 mg/dL (ref 6–23)
CO2: 23 mmol/L (ref 19–32)
Calcium: 9.7 mg/dL (ref 8.4–10.5)
Chloride: 105 mmol/L (ref 96–112)
Creatinine, Ser: 0.95 mg/dL (ref 0.50–1.10)
GFR calc Af Amer: 60 mL/min — ABNORMAL LOW (ref >90)
GFR, EST NON AFRICAN AMERICAN: 52 mL/min — AB (ref >90)
GLUCOSE: 151 mg/dL — AB (ref 70–99)
POTASSIUM: 4.2 mmol/L (ref 3.5–5.1)
Sodium: 138 mmol/L (ref 135–145)

## 2014-10-04 LAB — OSMOLALITY, URINE: OSMOLALITY UR: 142 mosm/kg — AB (ref 390–1090)

## 2014-10-04 LAB — OSMOLALITY: OSMOLALITY: 292 mosm/kg (ref 275–300)

## 2014-10-04 MED ORDER — IPRATROPIUM BROMIDE 0.02 % IN SOLN: 0.5000 mg | Freq: Four times a day (QID) | RESPIRATORY_TRACT | Status: DC | PRN

## 2014-10-04 MED ORDER — IPRATROPIUM-ALBUTEROL 0.5-2.5 (3) MG/3ML IN SOLN
3.0000 mL | RESPIRATORY_TRACT | Status: DC
  Administered 2014-10-05 (×3): 3 mL via RESPIRATORY_TRACT
  Filled 2014-10-04 (×4): qty 3

## 2014-10-04 MED ORDER — ALBUTEROL SULFATE (2.5 MG/3ML) 0.083% IN NEBU: 2.5000 mg | INHALATION_SOLUTION | Freq: Four times a day (QID) | RESPIRATORY_TRACT | Status: DC | PRN

## 2014-10-04 MED ORDER — IPRATROPIUM BROMIDE 0.02 % IN SOLN
0.5000 mg | Freq: Four times a day (QID) | RESPIRATORY_TRACT | Status: DC
  Administered 2014-10-04 (×4): 0.5 mg via RESPIRATORY_TRACT
  Filled 2014-10-04 (×4): qty 2.5

## 2014-10-04 NOTE — Evaluation (Signed)
Physical Therapy Evaluation Patient Details Name: [name] MRN: [id number] DOB: 09/04/1922 Today's Date: 10/04/2014   History of Present Illness  [name] is a 81 y.o. female, with history of COPD not on home oxygen, hypertension, GERD, chronic diastolic CHF baseline EF around 50-55%, comes in with 2 day history of gradually progressive shortness of breath and wheezing with some cough which is nonproductive.  Work up consistent with COPD exacerbation.  Clinical Impression  Pt admitted with/for COPD ex.  Pt currently limited functionally due to the problems listed below.  (see problems list.)  Pt will benefit from PT to maximize function and safety to be able to get home safely with available assist of family/caregivers.     Follow Up Recommendations Home health PT  (or SNF rehab if she doesn't have caregiver assist)    Equipment Recommendations  Other (comment) (?cane)    Recommendations for Other Services       Precautions / Restrictions Precautions Precautions: Fall (minimal risk)      Mobility  Bed Mobility Overal bed mobility: Modified Independent                Transfers Overall transfer level: Needs assistance   Transfers: Sit to/from Stand Sit to Stand: Supervision         General transfer comment: safe transition  Ambulation/Gait Ambulation/Gait assistance: Supervision Ambulation Distance (Feet): 200 Feet Assistive device: None Gait Pattern/deviations: Step-through pattern Gait velocity: slower   General Gait Details: epidodes of mild instability initially which improved as she walked further.  Stairs            Wheelchair Mobility    Modified Rankin (Stroke Patients Only)       Balance Overall balance assessment: Needs assistance Sitting-balance support: No upper extremity supported Sitting balance-Leahy Scale: Good     Standing balance support: No upper extremity supported Standing balance-Leahy Scale: Fair                                Pertinent Vitals/Pain Pain Assessment: No/denies pain    Home Living Family/patient expects to be discharged to:: Private residence Living Arrangements: Other (Comment) (pt relates living with a caregiver) Available Help at Discharge: Family;Available PRN/intermittently;Friend(s) Type of Home: House Home Access: Level entry     Home Layout: One level Home Equipment: Cane - single point Additional Comments: pt reports she has a caretaker and usually has someone present with her at home    Prior Function Level of Independence: Independent         Comments: independent with mobility per pt, assist for household chores     Hand Dominance   Dominant Hand: Right    Extremity/Trunk Assessment   Upper Extremity Assessment: Defer to OT evaluation           Lower Extremity Assessment: Overall WFL for tasks assessed         Communication   Communication: No difficulties  Cognition Arousal/Alertness: Awake/alert Behavior During Therapy: WFL for tasks assessed/performed Overall Cognitive Status: Within Functional Limits for tasks assessed (is a poor historian)                      General Comments General comments (skin integrity, edema, etc.): SpO2 during gait fell to 89% on RA with EHR in the 90s bpm.  With efficient breathing, pt's  sats were maintained at 92/93%    Exercises  Assessment/Plan    PT Assessment Patient needs continued PT services  PT Diagnosis Other (comment) (decreased tolerance to activity)   PT Problem List Decreased activity tolerance;Decreased mobility;Other (comment) (mild proximal weakness)  PT Treatment Interventions Gait training;Stair training;Functional mobility training;Therapeutic activities;Therapeutic exercise;Patient/family education   PT Goals (Current goals can be found in the Care Plan section) Acute Rehab PT Goals Patient Stated Goal: Find a place to live. PT Goal  Formulation: With patient Time For Goal Achievement: 10/11/14 Potential to Achieve Goals: Good    Frequency Min 3X/week   Barriers to discharge        Co-evaluation               End of Session   Activity Tolerance: Patient tolerated treatment well Patient left: in chair;with call bell/phone within reach Nurse Communication: Mobility status         Time: 4098-1191 PT Time Calculation (min) (ACUTE ONLY): 24 min   Charges:   PT Evaluation $Initial PT Evaluation Tier I: 1 Procedure PT Treatments $Gait Training: 8-22 mins   PT G Codes:        [place] Devine, Eliseo Gum 10/04/2014, 11:21 AM 10/04/2014  [place] Bing, PT [phone]  (pager)

## 2014-10-04 NOTE — Care Management Note (Addendum)
    Page 1 of 2   10/06/2014     2:42:03 PM CARE MANAGEMENT NOTE 10/06/2014  Patient:  [name],[name]   Account Number:  [id number]  Date Initiated:  10/04/2014  Documentation initiated by:  Letha CapeAYLOR,Hermione Havlicek  Subjective/Objective Assessment:   dx copd ex, arf  admit- lives with different family members.  [facility]     Action/Plan:   pt eval- rec hhpt   Anticipated DC Date:  10/06/2014   Anticipated DC Plan:  HOME W HOME HEALTH SERVICES      DC Planning Services  CM consult      [facility]  Resumption Of Svcs/PTA Provider   Choice offered to / List presented to:  C-1 Patient   DME arranged  WALKER - ROLLING      DME agency  [facility]     HH arranged  HH-1 RN  HH-2 PT  HH-3 OT      [facility]   Status of service:  Completed, signed off Medicare Important Message given?  YES (If response is "NO", the following Medicare IM given date fields will be blank) Date Medicare IM given:  10/06/2014 Medicare IM given by:  Letha CapeAYLOR,Bradin Mcadory Date Additional Medicare IM given:   Additional Medicare IM given by:    Discharge Disposition:  HOME W HOME HEALTH SERVICES  Per UR Regulation:  Reviewed for med. necessity/level of care/duration of stay  If discussed at Long Length of Stay Meetings, dates discussed:    Comments:  10/06/14 1238 Letha Capeeborah Ilyas Lipsitz RN, BSN [phone] patient is for dc today, [facility] notified. [facility] will deliver rolling walker to patient's home today. NCM spoke with patient's grand daughter she is a travel Charity fundraiserN, states she need to pick patient up in 40 minutes and [facility], this was relayed to the [employer] RN, said he would relay to [employer] RN.  Notified [facility] to add HHOt.  10/04/14 1525 Letha Capeeborah Aianna Fahs RN ,BSN [phone] [facility], will add [facility] , [facility], patient would like to cont with [facility].

## 2014-10-04 NOTE — Progress Notes (Signed)
[facility] PROGRESS NOTE   [name] [id number] DOB: 11/26/1922 DOA: 10/03/2014 PCP: [name], [name], MD  HPI/Subjective: Feels better, less wheezing and cough/Sputum production  Assessment/Plan: Active Problems:   SOB (shortness of breath)   Acute respiratory failure   COPD with exacerbation   COPD exacerbation   Essential hypertension   ARF (acute renal failure)   Acute on chronic respiratory failure due to acute on chronic COPD exacerbation - admit to telemetry bed, does have productive cough with sputum Gram stain culture, place on azithromycin orally, IV steroids Continue supportive management with bronchodilators, mucolytics, antitussives and oxygen as needed.  Dehydration with acute renal failure. Pain urine electrolytes, avoid nephrotoxins, with IV fluids. Check BMP in a.m.  Dyslipidemia continue home dose statin.   Essential hypertension. Will continue low-dose beta blocker along with Norvasc. Does not have reactive airway disease.   Code Status: Full Code Family Communication: Plan discussed with the patient. Disposition Plan: Remains inpatient Diet:    Consultants:  None  Procedures:  None  Antibiotics:  Rocephin and azithromycin   Objective: Filed Vitals:   10/04/14 1359  BP: 108/61  Pulse:   Temp: 97.6 F (36.4 C)  Resp: 18    Intake/Output Summary (Last 24 hours) at 10/04/14 1519 Last data filed at 10/04/14 1119  Gross per 24 hour  Intake 1238.75 ml  Output   1450 ml  Net -211.25 ml   Filed Weights   10/03/14 1901 10/04/14 0354  Weight: 51.3 kg (113 lb 1.5 oz) 46.4 kg (102 lb 4.7 oz)    Exam: General: Alert and awake, oriented x3, not in any acute distress. HEENT: anicteric sclera, pupils reactive to light and accommodation, EOMI CVS: S1-S2 clear, no murmur rubs or gallops Chest: clear to auscultation bilaterally, no wheezing, rales or rhonchi Abdomen: soft nontender, nondistended, normal bowel sounds, no  organomegaly Extremities: no cyanosis, clubbing or edema noted bilaterally Neuro: Cranial nerves II-XII intact, no focal neurological deficits  Data Reviewed: Basic Metabolic Panel:  Recent Labs Lab 10/03/14 1140 10/04/14 0736  NA 138 138  K 4.5 4.2  CL 103 105  CO2 26 23  GLUCOSE 91 151*  BUN 14 16  CREATININE 1.23* 0.95  CALCIUM 9.7 9.7   Liver Function Tests: No results for input(s): AST, ALT, ALKPHOS, BILITOT, PROT, ALBUMIN in the last 168 hours. No results for input(s): LIPASE, AMYLASE in the last 168 hours. No results for input(s): AMMONIA in the last 168 hours. CBC:  Recent Labs Lab 10/03/14 1140 10/04/14 0736  WBC 9.4 6.0  NEUTROABS 6.2  --   HGB 12.4 13.2  HCT 38.6 40.3  MCV 89.6 90.6  PLT 281 259   Cardiac Enzymes: No results for input(s): CKTOTAL, CKMB, CKMBINDEX, TROPONINI in the last 168 hours. BNP (last 3 results)  Recent Labs  09/18/14 1149 10/03/14 1140  BNP 51.6 24.5    ProBNP (last 3 results)  Recent Labs  10/08/13 0457  PROBNP 4063.0*    CBG: No results for input(s): GLUCAP in the last 168 hours.  Micro Recent Results (from the past 240 hour(s))  Culture, expectorated sputum-assessment     Status: None   Collection Time: 10/03/14  9:27 PM  Result Value Ref Range Status   Specimen Description SPUTUM  Final   Special Requests NONE  Final   Sputum evaluation   Final    THIS SPECIMEN IS ACCEPTABLE. RESPIRATORY CULTURE REPORT TO FOLLOW.   Report Status 10/03/2014 FINAL  Final  Culture, respiratory (NON-Expectorated)  Status: None (Preliminary result)   Collection Time: 10/03/14  9:27 PM  Result Value Ref Range Status   Specimen Description SPUTUM  Final   Special Requests ADDED 2256  Final   Gram Stain   Final    FEW WBC PRESENT,BOTH PMN AND MONONUCLEAR NO SQUAMOUS EPITHELIAL CELLS SEEN NO ORGANISMS SEEN Performed at [facility]    Culture PENDING  Incomplete   Report Status PENDING  Incomplete      Studies: Dg Chest 2 View  10/03/2014   CLINICAL DATA:  Shortness of breath for several days  EXAM: CHEST  2 VIEW  COMPARISON:  09/18/2014  FINDINGS: Cardiomediastinal silhouette is stable. Stable hyperinflation and chronic interstitial prominence. Again noted bilateral infrahilar bronchitic changes right greater than left. No infiltrate or pulmonary edema. Atherosclerotic calcifications of thoracic aorta.  IMPRESSION: No active disease. Stable hyperinflation and chronic interstitial prominence. Stable chronic infrahilar bronchitic changes right greater than left.   Electronically Signed   By: Natasha Mead M.D.   On: 10/03/2014 14:27    Scheduled Meds: . sodium chloride   Intravenous STAT  . albuterol  2.5 mg Nebulization Q4H  . allopurinol  450 mg Oral Daily  . amLODipine  10 mg Oral Daily  . aspirin EC  325 mg Oral QODAY  . atenolol  50 mg Oral Daily  . atorvastatin  20 mg Oral QHS  . azithromycin  500 mg Oral Daily  . budesonide  0.5 mg Nebulization BID  . heparin  5,000 Units Subcutaneous 3 times per day  . ipratropium  0.5 mg Nebulization QID  . methylPREDNISolone (SOLU-MEDROL) injection  60 mg Intravenous Q6H  . sodium chloride  3 mL Intravenous Q12H   Continuous Infusions: . sodium chloride 75 mL/hr at 10/04/14 0446       Time spent: 35 minutes    [facility] A  [facility] Pager [phone] If 7PM-7AM, please contact night-coverage at [email or web address], password [facility] 10/04/2014, 3:19 PM  LOS: 1 day

## 2014-10-05 LAB — CBC
HCT: 36.5 % (ref 36.0–46.0)
Hemoglobin: 11.8 g/dL — ABNORMAL LOW (ref 12.0–15.0)
MCH: 29.5 pg (ref 26.0–34.0)
MCHC: 32.3 g/dL (ref 30.0–36.0)
MCV: 91.3 fL (ref 78.0–100.0)
PLATELETS: 265 10*3/uL (ref 150–400)
RBC: 4 MIL/uL (ref 3.87–5.11)
RDW: 14.7 % (ref 11.5–15.5)
WBC: 11 10*3/uL — ABNORMAL HIGH (ref 4.0–10.5)

## 2014-10-05 LAB — BASIC METABOLIC PANEL
ANION GAP: 6 (ref 5–15)
BUN: 19 mg/dL (ref 6–23)
CHLORIDE: 110 mmol/L (ref 96–112)
CO2: 24 mmol/L (ref 19–32)
CREATININE: 0.84 mg/dL (ref 0.50–1.10)
Calcium: 9.3 mg/dL (ref 8.4–10.5)
GFR calc Af Amer: 69 mL/min — ABNORMAL LOW (ref >90)
GFR calc non Af Amer: 60 mL/min — ABNORMAL LOW (ref >90)
GLUCOSE: 171 mg/dL — AB (ref 70–99)
Potassium: 3.7 mmol/L (ref 3.5–5.1)
SODIUM: 140 mmol/L (ref 135–145)

## 2014-10-05 NOTE — Progress Notes (Signed)
[facility] PROGRESS NOTE   KEMIYAH TARAZON WJX:[id number] DOB: 07-29-1923 DOA: 10/03/2014 PCP: Pearson Grippe, MD  HPI/Subjective: Feels better, seen with daughter at bedside (she is a traveling nurse). Seen by PT and recommended home health PT.  Assessment/Plan: Active Problems:   SOB (shortness of breath)   Acute respiratory failure   COPD with exacerbation   COPD exacerbation   Essential hypertension   ARF (acute renal failure)   Acute on chronic respiratory failure due to acute on chronic COPD exacerbation Presented with productive cough with sputum Gram stain culture, place on azithromycin orally, IV steroids Continue supportive management with bronchodilators, mucolytics, antitussives and oxygen as needed. Continue current respiratory treatment, expect discharge in a.m.  Dehydration with acute renal failure Mild acute renal failure with creatinine up to 1.23. Hydrated with IV fluids, creatinine is down today to 0.84.  Dyslipidemia Continue home dose statin.  Essential hypertension Will continue low-dose beta blocker along with Norvasc. Does not have reactive airway disease.   Code Status: Full Code Family Communication: Plan discussed with the patient. Disposition Plan: Remains inpatient Diet:    Consultants:  None  Procedures:  None  Antibiotics:  Rocephin and azithromycin   Objective: Filed Vitals:   10/05/14 1439  BP: 127/76  Pulse: 79  Temp: 98.2 F (36.8 C)  Resp: 18    Intake/Output Summary (Last 24 hours) at 10/05/14 1457 Last data filed at 10/05/14 0851  Gross per 24 hour  Intake 1182.5 ml  Output   1150 ml  Net   32.5 ml   Filed Weights   10/03/14 1901 10/04/14 0354  Weight: 51.3 kg (113 lb 1.5 oz) 46.4 kg (102 lb 4.7 oz)    Exam: General: Alert and awake, oriented x3, not in any acute distress. HEENT: anicteric sclera, pupils reactive to light and accommodation, EOMI CVS: S1-S2 clear, no murmur rubs or gallops Chest:  clear to auscultation bilaterally, no wheezing, rales or rhonchi Abdomen: soft nontender, nondistended, normal bowel sounds, no organomegaly Extremities: no cyanosis, clubbing or edema noted bilaterally Neuro: Cranial nerves II-XII intact, no focal neurological deficits  Data Reviewed: Basic Metabolic Panel:  Recent Labs Lab 10/03/14 1140 10/04/14 0736 10/05/14 0535  NA 138 138 140  K 4.5 4.2 3.7  CL 103 105 110  CO2 GLUCOSE 91 151* 171*  BUN CREATININE 1.23* 0.95 0.84  CALCIUM 9.7 9.7 9.3   Liver Function Tests: No results for input(s): AST, ALT, ALKPHOS, BILITOT, PROT, ALBUMIN in the last 168 hours. No results for input(s): LIPASE, AMYLASE in the last 168 hours. No results for input(s): AMMONIA in the last 168 hours. CBC:  Recent Labs Lab 10/03/14 1140 10/04/14 0736 10/05/14 0535  WBC 9.4 6.0 11.0*  NEUTROABS 6.2  --   --   HGB 12.4 13.2 11.8*  HCT 38.6 40.3 36.5  MCV 89.6 90.6 91.3  PLT 281 259 265   Cardiac Enzymes: No results for input(s): CKTOTAL, CKMB, CKMBINDEX, TROPONINI in the last 168 hours. BNP (last 3 results)  Recent Labs  09/18/14 1149 10/03/14 1140  BNP 51.6 24.5    ProBNP (last 3 results)  Recent Labs  10/08/13 0457  PROBNP 4063.0*    CBG: No results for input(s): GLUCAP in the last 168 hours.  Micro Recent Results (from the past 240 hour(s))  Culture, expectorated sputum-assessment     Status: None   Collection Time: 10/03/14  9:27 PM  Result Value Ref Range Status  Specimen Description SPUTUM  Final   Special Requests NONE  Final   Sputum evaluation   Final    THIS SPECIMEN IS ACCEPTABLE. RESPIRATORY CULTURE REPORT TO FOLLOW.   Report Status 10/03/2014 FINAL  Final  Culture, respiratory (NON-Expectorated)     Status: None (Preliminary result)   Collection Time: 10/03/14  9:27 PM  Result Value Ref Range Status   Specimen Description SPUTUM  Final   Special Requests ADDED 2256  Final   Gram Stain    Final    FEW WBC PRESENT,BOTH PMN AND MONONUCLEAR NO SQUAMOUS EPITHELIAL CELLS SEEN NO ORGANISMS SEEN [facility]formed at [facility]    Culture   Final    NORMAL OROPHARYNGEAL FLORA Performed at [facility]    Report Status PENDING  Incomplete     Studies: No results found.  Scheduled Meds: . allopurinol  450 mg Oral Daily  . amLODipine  10 mg Oral Daily  . aspirin EC  325 mg Oral QODAY  . atenolol  50 mg Oral Daily  . atorvastatin  20 mg Oral QHS  . azithromycin  500 mg Oral Daily  . budesonide  0.5 mg Nebulization BID  . heparin  5,000 Units Subcutaneous 3 times per day  . ipratropium-albuterol  3 mL Nebulization Q4H WA  . methylPREDNISolone (SOLU-MEDROL) injection  60 mg Intravenous Q6H  . sodium chloride  3 mL Intravenous Q12H   Continuous Infusions:       Time spent: 35 minutes    [facility]MAHI,Jessyca Sloan A  [facility] Pager [phone] If 7PM-7AM, please contact night-coverage at [email or web address], password [facility] 10/05/2014, 2:57 PM  LOS: 2 days

## 2014-10-06 LAB — CULTURE, RESPIRATORY

## 2014-10-06 LAB — CULTURE, RESPIRATORY W GRAM STAIN: Culture: NORMAL

## 2014-10-06 MED ORDER — IPRATROPIUM-ALBUTEROL 0.5-2.5 (3) MG/3ML IN SOLN
3.0000 mL | Freq: Three times a day (TID) | RESPIRATORY_TRACT | Status: DC
  Administered 2014-10-06 (×2): 3 mL via RESPIRATORY_TRACT
  Filled 2014-10-06 (×2): qty 3

## 2014-10-06 MED ORDER — GUAIFENESIN ER 1200 MG PO TB12: 1200.0000 mg | ORAL_TABLET | Freq: Two times a day (BID) | ORAL | Status: DC

## 2014-10-06 MED ORDER — PREDNISONE (PAK) 10 MG PO TABS: ORAL_TABLET | ORAL | Status: DC

## 2014-10-06 MED ORDER — LEVOFLOXACIN 750 MG PO TABS: 750.0000 mg | ORAL_TABLET | Freq: Every day | ORAL | Status: DC

## 2014-10-06 NOTE — Progress Notes (Signed)
[name] discharged Home with H/H RN & PT per MD order.  Discharge instructions reviewed and discussed with the patient & pt. granddaughter, all questions and concerns answered. Copy of instructions and scripts given to patient & granddaughter    Medication List    TAKE these medications        albuterol (5 MG/ML) 0.5% nebulizer solution  Commonly known as:  PROVENTIL  Take 2.5 mg by nebulization every 4 (four) hours.     albuterol 108 (90 BASE) MCG/ACT inhaler  Commonly known as:  PROVENTIL HFA;VENTOLIN HFA  Inhale 2 puffs into the lungs every 4 (four) hours as needed for wheezing or shortness of breath.     allopurinol 300 MG tablet  Commonly known as:  ZYLOPRIM  Take 450 mg by mouth daily.     amLODipine 10 MG tablet  Commonly known as:  NORVASC  Take 10 mg by mouth daily.     aspirin EC 325 MG tablet  Take 325 mg by mouth every other day.     atenolol 25 MG tablet  Commonly known as:  TENORMIN  Take 50 mg by mouth daily.     atorvastatin 20 MG tablet  Commonly known as:  LIPITOR  Take 20 mg by mouth at bedtime.     budesonide 0.5 MG/2ML nebulizer solution  Commonly known as:  PULMICORT  Take 0.5 mg by nebulization 2 (two) times daily.     Guaifenesin 1200 MG Tb12  Commonly known as:  MUCINEX MAXIMUM STRENGTH  Take 1 tablet (1,200 mg total) by mouth 2 (two) times daily.     ipratropium 0.02 % nebulizer solution  Commonly known as:  ATROVENT  Take 2.5 mLs (0.5 mg total) by nebulization 4 (four) times daily.     ipratropium-albuterol 0.5-2.5 (3) MG/3ML Soln  Commonly known as:  DUONEB  Take 3 mLs by nebulization every 6 (six) hours as needed.     levofloxacin 750 MG tablet  Commonly known as:  LEVAQUIN  Take 1 tablet (750 mg total) by mouth daily.     losartan 100 MG tablet  Commonly known as:  COZAAR  Take 100 mg by mouth daily.     predniSONE 10 MG tablet  Commonly known as:  STERAPRED UNI-PAK  Take 6-5-4-3-2-1 tablet PO daily till gone         Patients skin is clean, dry and intact, no evidence of skin break down. IV site discontinued and catheter remains intact. Site without signs and symptoms of complications. Dressing and pressure applied.  Patient escorted to car by NT & RN in a wheelchair,  no distress noted upon discharge.  [name], [name] 10/06/2014 2:43 PM

## 2014-10-06 NOTE — Progress Notes (Signed)
Physical Therapy Treatment Patient Details Name: [name] MRN: [id number] DOB: 11/18/1922 Today's Date: 10/06/2014    History of Present Illness [name] is a 81 y.o. female, with history of COPD not on home oxygen, hypertension, GERD, chronic diastolic CHF baseline EF around 50-55%, comes in with 2 day history of gradually progressive shortness of breath and wheezing with some cough which is nonproductive.  Work up consistent with COPD exacerbation.    PT Comments    Pt ambulated with and without RW.  She demonstrated improved stability and subjectively liked ambulating with the RW.  O2 decreased to 89% with first gait trial with rail and HHA and to 87% with second gait trial with RW.  Both times increased into 90's with rest and cues for proper breathing.  Follow Up Recommendations  Home health PT     Equipment Recommendations  Rolling walker with 5" wheels    Recommendations for Other Services       Precautions / Restrictions Precautions Precautions: Fall    Mobility  Bed Mobility Overal bed mobility: Modified Independent             General bed mobility comments: bed flat  Transfers     Transfers: Sit to/from Stand Sit to Stand: Supervision            Ambulation/Gait Ambulation/Gait assistance: Supervision;Min guard Ambulation Distance (Feet): 180 Feet (plus 80) Assistive device: None;Rolling walker (2 wheeled) Gait Pattern/deviations: Step-through pattern     General Gait Details: Pt ambulating with reaching for rails and then for PT's hand with gait 180' with o2 89% at end of gait. O2 increased back into 90's and then ambulated 6080' with RW with S. Pt steadier and reports liking the RW. Pt os 87% after second gait trial on room air with it increasing into 90s with rest and breathing techniques.   Stairs            Wheelchair Mobility    Modified Rankin (Stroke Patients Only)       Balance     Sitting balance-Leahy Scale:  Good       Standing balance-Leahy Scale: Fair                      Cognition Arousal/Alertness: Awake/alert Behavior During Therapy: WFL for tasks assessed/performed Overall Cognitive Status: Within Functional Limits for tasks assessed                      Exercises General Exercises - Lower Extremity Long Arc Quad: Strengthening;Both;10 reps;Seated Hip Flexion/Marching: Strengthening;Both;10 reps;Seated    General Comments        Pertinent Vitals/Pain Pain Assessment: No/denies pain    Home Living                      Prior Function            PT Goals (current goals can now be found in the care plan section) Acute Rehab PT Goals Patient Stated Goal: Find a place to live. Time For Goal Achievement: 10/11/14 Progress towards PT goals: Progressing toward goals    Frequency  Min 3X/week    PT Plan Current plan remains appropriate    Co-evaluation             End of Session Equipment Utilized During Treatment: Gait belt Activity Tolerance: Patient tolerated treatment well Patient left: in bed;with bed alarm set;with call bell/phone within reach  Time: 9562-1308 PT Time Calculation (min) (ACUTE ONLY): 25 min  Charges:  $Gait Training: 23-37 mins                    G Codes:      Aditya Nastasi LUBECK 10/06/2014, 11:29 AM

## 2014-10-06 NOTE — Discharge Summary (Signed)
Physician Discharge Summary  Katha HammingCallie B Lazarz ZOX:[id number] DOB: 07/12/1923 DOA: 10/03/2014  PCP: [name], [name], MD  Admit date: 10/03/2014 Discharge date: 10/06/2014  Time spent: 40 minutes  Recommendations for Outpatient Follow-up:  1. Follow-up with primary care physician within one week.  Discharge Diagnoses:  Active Problems:   SOB (shortness of breath)   Acute respiratory failure   COPD with exacerbation   COPD exacerbation   Essential hypertension   ARF (acute renal failure)   Discharge Condition: Stable  Diet recommendation: Heart healthy  Filed Weights   10/03/14 1901 10/04/14 0354 10/06/14 0500  Weight: 51.3 kg (113 lb 1.5 oz) 46.4 kg (102 lb 4.7 oz) 49.5 kg (109 lb 2 oz)    History of present illness:  [name] is a 81 y.o. female, with history of COPD not on home oxygen, hypertension, GERD, chronic diastolic CHF baseline EF around 50-55%, comes in with 2 day history of gradually progressive shortness of breath and wheezing with some cough which is nonproductive. No fever or chills, no chest pain palpitations. No other complaints.  In the ER workup consistent with acute on chronic COPD exacerbation, dehydration with acute renal failure and I was called to admit the patient.  Hospital Course:   Acute COPD exacerbation Presented with productive cough with sputum Gram stain culture, placed on azithromycin orally, IV steroids Supportive management with bronchodilators, mucolytics, antitussives and oxygen as needed. Home bronchodilators restarted, patient is on multiple bronchodilators. Walked around with physical therapy did not qualify for oxygen, lowest oxygen desaturation was 89% with walking. Discharge on 5 more days of levofloxacin, prednisone taper and Mucinex.  Dehydration with acute renal failure Mild acute renal failure with creatinine up to 1.23. Hydrated with IV fluids, creatinine is down today to 0.84.  Dyslipidemia Continue home dose  statin.  Essential hypertension Will continue low-dose beta blocker along with Norvasc. Does not have reactive airway disease.    Procedures:  None  Consultations:  None  Discharge Exam: Filed Vitals:   10/06/14 1032  BP: 127/64  Pulse:   Temp:   Resp:    General: Alert and awake, oriented x3, not in any acute distress. HEENT: anicteric sclera, pupils reactive to light and accommodation, EOMI CVS: S1-S2 clear, no murmur rubs or gallops Chest: clear to auscultation bilaterally, no wheezing, rales or rhonchi Abdomen: soft nontender, nondistended, normal bowel sounds, no organomegaly Extremities: no cyanosis, clubbing or edema noted bilaterally Neuro: Cranial nerves II-XII intact, no focal neurological deficits   Discharge Instructions   Discharge Instructions    Diet - low sodium heart healthy    Complete by:  As directed      Increase activity slowly    Complete by:  As directed           Current Discharge Medication List    START taking these medications   Details  Guaifenesin (MUCINEX MAXIMUM STRENGTH) 1200 MG TB12 Take 1 tablet (1,200 mg total) by mouth 2 (two) times daily. Qty: 14 each, Refills: 0    levofloxacin (LEVAQUIN) 750 MG tablet Take 1 tablet (750 mg total) by mouth daily. Qty: 5 tablet, Refills: 0    predniSONE (STERAPRED UNI-PAK) 10 MG tablet Take 6-5-4-3-2-1 tablet PO daily till gone Qty: 21 tablet, Refills: 0      CONTINUE these medications which have NOT CHANGED   Details  albuterol (PROVENTIL HFA;VENTOLIN HFA) 108 (90 BASE) MCG/ACT inhaler Inhale 2 puffs into the lungs every 4 (four) hours as needed for wheezing or shortness of  breath.     albuterol (PROVENTIL) (5 MG/ML) 0.5% nebulizer solution Take 2.5 mg by nebulization every 4 (four) hours.    allopurinol (ZYLOPRIM) 300 MG tablet Take 450 mg by mouth daily.     amLODipine (NORVASC) 10 MG tablet Take 10 mg by mouth daily.     aspirin EC 325 MG tablet Take 325 mg by mouth every  other day.    atenolol (TENORMIN) 25 MG tablet Take 50 mg by mouth daily.     atorvastatin (LIPITOR) 20 MG tablet Take 20 mg by mouth at bedtime.    budesonide (PULMICORT) 0.5 MG/2ML nebulizer solution Take 0.5 mg by nebulization 2 (two) times daily.    ipratropium (ATROVENT) 0.02 % nebulizer solution Take 2.5 mLs (0.5 mg total) by nebulization 4 (four) times daily. Qty: 75 mL, Refills: 0    ipratropium-albuterol (DUONEB) 0.5-2.5 (3) MG/3ML SOLN Take 3 mLs by nebulization every 6 (six) hours as needed. Qty: 360 mL, Refills: 3    losartan (COZAAR) 100 MG tablet Take 100 mg by mouth daily.        Allergies  Allergen Reactions  . Penicillins Rash    Tolerated ceftriaxone   Follow-up Information    Follow up with Pearson Grippe, MD In 2 days.   Specialty:  Internal Medicine   Why:  As needed   Contact information:   [address] [place] [address] [phone]       Follow up with [facility] EMERGENCY DEPARTMENT.   Specialty:  Emergency Medicine   Why:  Return to ER for worsening conditions., Fever, difficulty breathing, chest pain, numbness   Contact information:   [address] [id number] mc [place] [address] [phone]      Follow up with [facility].   Why:  Resume HHRN and hhpt   Contact information:   [address] [place] [address] [phone]       Follow up with Pearson Grippe, MD In 1 week.   Specialty:  Internal Medicine   Contact information:   [address] [place] [address] [phone]        The results of significant diagnostics from this hospitalization (including imaging, microbiology, ancillary and laboratory) are listed below for reference.    Significant Diagnostic Studies: Dg Chest 2 View  10/03/2014   CLINICAL DATA:  Shortness of breath for several days  EXAM: CHEST  2 VIEW  COMPARISON:  09/18/2014  FINDINGS: Cardiomediastinal  silhouette is stable. Stable hyperinflation and chronic interstitial prominence. Again noted bilateral infrahilar bronchitic changes right greater than left. No infiltrate or pulmonary edema. Atherosclerotic calcifications of thoracic aorta.  IMPRESSION: No active disease. Stable hyperinflation and chronic interstitial prominence. Stable chronic infrahilar bronchitic changes right greater than left.   Electronically Signed   By: Natasha Mead M.D.   On: 10/03/2014 14:27   Dg Chest 2 View  09/18/2014   CLINICAL DATA:  Shortness of breath, wheezing and cough for 1 month. History of COPD.  EXAM: CHEST  2 VIEW  COMPARISON:  PA and lateral chest 08/21/2014 and 07/18/2014.  FINDINGS: The lungs are emphysematous with distortion of the pulmonary architecture identified. No consolidative process, pneumothorax or effusion. Heart size is normal. No focal bony abnormality is seen.  IMPRESSION: Emphysema without acute disease.   Electronically Signed   By: Drusilla Kanner M.D.   On: 09/18/2014 13:13    Microbiology: Recent Results (from the past 240 hour(s))  Culture,  expectorated sputum-assessment     Status: None   Collection Time: 10/03/14  9:27 PM  Result Value Ref Range Status   Specimen Description SPUTUM  Final   Special Requests NONE  Final   Sputum evaluation   Final    THIS SPECIMEN IS ACCEPTABLE. RESPIRATORY CULTURE REPORT TO FOLLOW.   Report Status 10/03/2014 FINAL  Final  Culture, respiratory (NON-Expectorated)     Status: None   Collection Time: 10/03/14  9:27 PM  Result Value Ref Range Status   Specimen Description SPUTUM  Final   Special Requests ADDED 2256  Final   Gram Stain   Final    FEW WBC PRESENT,BOTH PMN AND MONONUCLEAR NO SQUAMOUS EPITHELIAL CELLS SEEN NO ORGANISMS SEEN Performed at [facility]    Culture   Final    NORMAL OROPHARYNGEAL FLORA Performed at [facility]    Report Status 10/06/2014 FINAL  Final     Labs: Basic Metabolic Panel:  Recent  Labs Lab 10/03/14 1140 10/04/14 0736 10/05/14 0535  NA 138 138 140  K 4.5 4.2 3.7  CL 103 105 110  CO2 GLUCOSE 91 151* 171*  BUN CREATININE 1.23* 0.95 0.84  CALCIUM 9.7 9.7 9.3   Liver Function Tests: No results for input(s): AST, ALT, ALKPHOS, BILITOT, PROT, ALBUMIN in the last 168 hours. No results for input(s): LIPASE, AMYLASE in the last 168 hours. No results for input(s): AMMONIA in the last 168 hours. CBC:  Recent Labs Lab 10/03/14 1140 10/04/14 0736 10/05/14 0535  WBC 9.4 6.0 11.0*  NEUTROABS 6.2  --   --   HGB 12.4 13.2 11.8*  HCT 38.6 40.3 36.5  MCV 89.6 90.6 91.3  PLT 281 259 265   Cardiac Enzymes: No results for input(s): CKTOTAL, CKMB, CKMBINDEX, TROPONINI in the last 168 hours. BNP: BNP (last 3 results)  Recent Labs  09/18/14 1149 10/03/14 1140  BNP 51.6 24.5    ProBNP (last 3 results)  Recent Labs  10/08/13 0457  PROBNP 4063.0*    CBG: No results for input(s): GLUCAP in the last 168 hours.     Signed:  Hatley Henegar A  [facility] 10/06/2014, 10:44 AM

## 2014-11-14 ENCOUNTER — Institutional Professional Consult (permissible substitution): Payer: Medicare HMO | Admitting: Internal Medicine

## 2014-11-15 ENCOUNTER — Institutional Professional Consult (permissible substitution): Payer: Medicare HMO | Admitting: Internal Medicine

## 2014-11-28 ENCOUNTER — Emergency Department (HOSPITAL_COMMUNITY): Payer: Medicare HMO

## 2014-11-28 ENCOUNTER — Emergency Department (HOSPITAL_COMMUNITY)
Admission: EM | Admit: 2014-11-28 | Discharge: 2014-11-28 | Disposition: A | Discharge status: Home / Self Care | Payer: Medicare HMO | Attending: Emergency Medicine | Admitting: Emergency Medicine

## 2014-11-28 ENCOUNTER — Encounter (HOSPITAL_COMMUNITY): Payer: Self-pay | Admitting: Emergency Medicine

## 2014-11-28 DIAGNOSIS — R05 Cough: Secondary | ICD-10-CM

## 2014-11-28 DIAGNOSIS — M109 Gout, unspecified: Secondary | ICD-10-CM | POA: Diagnosis not present

## 2014-11-28 DIAGNOSIS — M1990 Unspecified osteoarthritis, unspecified site: Secondary | ICD-10-CM | POA: Diagnosis not present

## 2014-11-28 DIAGNOSIS — Z7951 Long term (current) use of inhaled steroids: Secondary | ICD-10-CM | POA: Insufficient documentation

## 2014-11-28 DIAGNOSIS — Z87891 Personal history of nicotine dependence: Secondary | ICD-10-CM | POA: Insufficient documentation

## 2014-11-28 DIAGNOSIS — R059 Cough, unspecified: Secondary | ICD-10-CM

## 2014-11-28 DIAGNOSIS — Z792 Long term (current) use of antibiotics: Secondary | ICD-10-CM | POA: Insufficient documentation

## 2014-11-28 DIAGNOSIS — Z79899 Other long term (current) drug therapy: Secondary | ICD-10-CM | POA: Diagnosis not present

## 2014-11-28 DIAGNOSIS — J449 Chronic obstructive pulmonary disease, unspecified: Secondary | ICD-10-CM

## 2014-11-28 DIAGNOSIS — Z760 Encounter for issue of repeat prescription: Secondary | ICD-10-CM

## 2014-11-28 DIAGNOSIS — Z8719 Personal history of other diseases of the digestive system: Secondary | ICD-10-CM | POA: Insufficient documentation

## 2014-11-28 DIAGNOSIS — Z7982 Long term (current) use of aspirin: Secondary | ICD-10-CM | POA: Diagnosis not present

## 2014-11-28 DIAGNOSIS — Z880 Allergy status to penicillin: Secondary | ICD-10-CM | POA: Diagnosis not present

## 2014-11-28 LAB — I-STAT CHEM 8, ED
BUN: 20 mg/dL (ref 6–23)
Calcium, Ion: 1.22 mmol/L (ref 1.13–1.30)
Chloride: 104 mmol/L (ref 96–112)
Creatinine, Ser: 0.9 mg/dL (ref 0.50–1.10)
Glucose, Bld: 90 mg/dL (ref 70–99)
HEMATOCRIT: 38 % (ref 36.0–46.0)
HEMOGLOBIN: 12.9 g/dL (ref 12.0–15.0)
Potassium: 4.1 mmol/L (ref 3.5–5.1)
Sodium: 140 mmol/L (ref 135–145)
TCO2: 24 mmol/L (ref 0–100)

## 2014-11-28 MED ORDER — IPRATROPIUM-ALBUTEROL 0.5-2.5 (3) MG/3ML IN SOLN: 3.0000 mL | Freq: Four times a day (QID) | RESPIRATORY_TRACT | Status: DC | PRN

## 2014-11-28 MED ORDER — ALBUTEROL SULFATE HFA 108 (90 BASE) MCG/ACT IN AERS: 2.0000 | INHALATION_SPRAY | RESPIRATORY_TRACT | Status: DC | PRN

## 2014-11-28 MED ORDER — BUDESONIDE 0.5 MG/2ML IN SUSP: 0.5000 mg | Freq: Two times a day (BID) | RESPIRATORY_TRACT | Status: DC

## 2014-11-28 NOTE — ED Notes (Signed)
Pt reports that she is here today for a refill of her breathing medication. Pt reports shortness of breath starting after going to [place] last fall. Pt denies that it is getting any worse today.

## 2014-11-28 NOTE — ED Notes (Signed)
I gave the patient a cup of ice and a coke per nurse Molli HazardMatthew.

## 2014-11-28 NOTE — ED Provider Notes (Signed)
CSN: [id number]     Arrival date & time 11/28/14  0911 History   First MD Initiated Contact with Patient 11/28/14 [phone]     Chief Complaint  Patient presents with  . Medication Refill     HPI Patient presents to the emergency department requesting refill of her pulmonary medications.  She states she ran out several days ago.  She reports a history of COPD as well as ongoing cough over the past several days.  She denies fevers or chills.  No productive cough.  Denies orthopnea.  She reports some mild shortness of breath over the past couple days but no shortness of breath currently.  She is without any active symptoms as we speak.  No unilateral leg swelling.  No history DVT or pulmonary embolism.   Past Medical History  Diagnosis Date  . Hypertension   . Arthritis   . Gout   . H/O hiatal hernia   . GERD (gastroesophageal reflux disease)   . Bronchitis    Past Surgical History  Procedure Laterality Date  . Appendectomy    . Tee without cardioversion N/A 10/13/2013    Procedure: TRANSESOPHAGEAL ECHOCARDIOGRAM (TEE);  Surgeon: Quintella Reichert, MD;  Location: [facility] ENDOSCOPY;  Service: Cardiovascular;  Laterality: N/A;   Family History  Problem Relation Age of Onset  . Cancer Mother   . Stroke Father    History  Substance Use Topics  . Smoking status: Former Smoker -- 0.50 packs/day for 70 years  . Smokeless tobacco: Former Neurosurgeon    Quit date: 10/13/2004  . Alcohol Use: No   OB History    No data available     Review of Systems  All other systems reviewed and are negative.     Allergies  Penicillins  Home Medications   Prior to Admission medications   Medication Sig Start Date End Date Taking? Authorizing Provider  albuterol (PROVENTIL HFA;VENTOLIN HFA) 108 (90 BASE) MCG/ACT inhaler Inhale 2 puffs into the lungs every 4 (four) hours as needed for wheezing or shortness of breath.     Historical Provider, MD  albuterol (PROVENTIL) (5 MG/ML) 0.5% nebulizer solution Take 2.5  mg by nebulization every 4 (four) hours.    Historical Provider, MD  allopurinol (ZYLOPRIM) 300 MG tablet Take 450 mg by mouth daily.     Historical Provider, MD  amLODipine (NORVASC) 10 MG tablet Take 10 mg by mouth daily.  06/07/14   Historical Provider, MD  aspirin EC 325 MG tablet Take 325 mg by mouth every other day.    Historical Provider, MD  atenolol (TENORMIN) 25 MG tablet Take 50 mg by mouth daily.     Historical Provider, MD  atorvastatin (LIPITOR) 20 MG tablet Take 20 mg by mouth at bedtime.    Historical Provider, MD  budesonide (PULMICORT) 0.5 MG/2ML nebulizer solution Take 0.5 mg by nebulization 2 (two) times daily.    Historical Provider, MD  Guaifenesin (MUCINEX MAXIMUM STRENGTH) 1200 MG TB12 Take 1 tablet (1,200 mg total) by mouth 2 (two) times daily. 10/06/14   Clydia Llano, MD  ipratropium (ATROVENT) 0.02 % nebulizer solution Take 2.5 mLs (0.5 mg total) by nebulization 4 (four) times daily. 10/01/14   Mellody Drown, PA-C  ipratropium-albuterol (DUONEB) 0.5-2.5 (3) MG/3ML SOLN Take 3 mLs by nebulization every 6 (six) hours as needed. Patient taking differently: Take 3 mLs by nebulization every 4 (four) hours. For shortness of breath 01/18/14   Stephani Police, PA-C  levofloxacin (LEVAQUIN) 750 MG tablet Take  1 tablet (750 mg total) by mouth daily. 10/06/14   Clydia LlanoMutaz Elmahi, MD  losartan (COZAAR) 100 MG tablet Take 100 mg by mouth daily.  06/28/14   Historical Provider, MD  predniSONE (STERAPRED UNI-PAK) 10 MG tablet Take 6-5-4-3-2-1 tablet PO daily till gone 10/06/14   Mutaz Elmahi, MD   BP 144/81 mmHg  Pulse 75  Temp(Src) 97.7 F (36.5 C) (Oral)  Resp 20  Wt 114 lb (51.71 kg)  SpO2 97% Physical Exam  Constitutional: She is oriented to person, place, and time. She appears well-developed and well-nourished. No distress.  HENT:  Head: Normocephalic and atraumatic.  Eyes: EOM are normal.  Neck: Normal range of motion.  Cardiovascular: Normal rate, regular rhythm and normal heart  sounds.   Pulmonary/Chest: Effort normal and breath sounds normal. She has no wheezes.  Abdominal: Soft. She exhibits no distension. There is no tenderness.  Musculoskeletal: Normal range of motion.  Neurological: She is alert and oriented to person, place, and time.  Skin: Skin is warm and dry.  Psychiatric: She has a normal mood and affect. Judgment normal.  Nursing note and vitals reviewed.   ED Course  Procedures (including critical care time) Labs Review Labs Reviewed  I-STAT CHEM 8, ED    Imaging Review Dg Chest 2 View  11/28/2014   CLINICAL DATA:  Shortness of breath, cough for 9 months, history hypertension, gout, hiatal hernia with GERD, bronchitis, COPD, former smoker  EXAM: CHEST  2 VIEW  COMPARISON:  10/03/2014  FINDINGS: Borderline enlargement of cardiac silhouette.  Calcified tortuous aorta.  Mediastinal contours and pulmonary vascularity stable.  Emphysematous and bronchitic changes consistent with COPD.  Chronic interstitial lung disease question fibrosis throughout both lungs greatest at bases unchanged.  No definite superimposed acute infiltrate, pleural effusion, or pneumothorax.  Bones demineralized.  IMPRESSION: COPD changes with stable chronic interstitial lung disease changes.  No acute abnormalities.   Electronically Signed   By: Ulyses SouthwardMark  Boles M.D.   On: 11/28/2014 10:50  I personally reviewed the imaging tests through PACS system I reviewed available ER/hospitalization records through the EMR    EKG Interpretation None      MDM   Final diagnoses:  None    Medication refill.  Chest x-ray clear.  Vital signs are normal.  No hypoxia.  Hemoglobin normal.  Discharge home in good condition.  She is really here more for medication refill than anything but given her advanced age I felt the x-ray to evaluate for pneumonia would be worthwhile.  Primary care follow-up.    Azalia BilisKevin Laretha Luepke, MD 11/28/14 1110

## 2015-02-21 ENCOUNTER — Encounter (HOSPITAL_COMMUNITY): Payer: Self-pay | Admitting: Emergency Medicine

## 2015-02-21 ENCOUNTER — Inpatient Hospital Stay (HOSPITAL_COMMUNITY)
Admission: EM | Admit: 2015-02-21 | Discharge: 2015-02-25 | DRG: 682 | Disposition: A | Discharge status: Home Health | Payer: Medicare HMO | Attending: Internal Medicine | Admitting: Internal Medicine

## 2015-02-21 DIAGNOSIS — R059 Cough, unspecified: Secondary | ICD-10-CM

## 2015-02-21 DIAGNOSIS — Z7952 Long term (current) use of systemic steroids: Secondary | ICD-10-CM

## 2015-02-21 DIAGNOSIS — N179 Acute kidney failure, unspecified: Secondary | ICD-10-CM | POA: Diagnosis not present

## 2015-02-21 DIAGNOSIS — R627 Adult failure to thrive: Secondary | ICD-10-CM | POA: Diagnosis present

## 2015-02-21 DIAGNOSIS — Z7982 Long term (current) use of aspirin: Secondary | ICD-10-CM

## 2015-02-21 DIAGNOSIS — J449 Chronic obstructive pulmonary disease, unspecified: Secondary | ICD-10-CM | POA: Diagnosis present

## 2015-02-21 DIAGNOSIS — E43 Unspecified severe protein-calorie malnutrition: Secondary | ICD-10-CM | POA: Diagnosis present

## 2015-02-21 DIAGNOSIS — M1990 Unspecified osteoarthritis, unspecified site: Secondary | ICD-10-CM | POA: Diagnosis present

## 2015-02-21 DIAGNOSIS — R531 Weakness: Secondary | ICD-10-CM | POA: Diagnosis not present

## 2015-02-21 DIAGNOSIS — Z681 Body mass index (BMI) 19 or less, adult: Secondary | ICD-10-CM

## 2015-02-21 DIAGNOSIS — Z9049 Acquired absence of other specified parts of digestive tract: Secondary | ICD-10-CM | POA: Diagnosis present

## 2015-02-21 DIAGNOSIS — K219 Gastro-esophageal reflux disease without esophagitis: Secondary | ICD-10-CM | POA: Diagnosis present

## 2015-02-21 DIAGNOSIS — E860 Dehydration: Secondary | ICD-10-CM

## 2015-02-21 DIAGNOSIS — J690 Pneumonitis due to inhalation of food and vomit: Secondary | ICD-10-CM | POA: Diagnosis not present

## 2015-02-21 DIAGNOSIS — Z87891 Personal history of nicotine dependence: Secondary | ICD-10-CM

## 2015-02-21 DIAGNOSIS — I10 Essential (primary) hypertension: Secondary | ICD-10-CM | POA: Diagnosis present

## 2015-02-21 DIAGNOSIS — M109 Gout, unspecified: Secondary | ICD-10-CM | POA: Diagnosis present

## 2015-02-21 DIAGNOSIS — K449 Diaphragmatic hernia without obstruction or gangrene: Secondary | ICD-10-CM | POA: Diagnosis present

## 2015-02-21 DIAGNOSIS — Z880 Allergy status to penicillin: Secondary | ICD-10-CM

## 2015-02-21 DIAGNOSIS — R05 Cough: Secondary | ICD-10-CM

## 2015-02-21 LAB — I-STAT CHEM 8, ED
BUN: 26 mg/dL — ABNORMAL HIGH (ref 6–20)
CALCIUM ION: 1.19 mmol/L (ref 1.13–1.30)
CHLORIDE: 101 mmol/L (ref 101–111)
Creatinine, Ser: 1.8 mg/dL — ABNORMAL HIGH (ref 0.44–1.00)
GLUCOSE: 102 mg/dL — AB (ref 65–99)
HEMATOCRIT: 39 % (ref 36.0–46.0)
HEMOGLOBIN: 13.3 g/dL (ref 12.0–15.0)
Potassium: 3.4 mmol/L — ABNORMAL LOW (ref 3.5–5.1)
Sodium: 140 mmol/L (ref 135–145)
TCO2: 24 mmol/L (ref 0–100)

## 2015-02-21 LAB — COMPREHENSIVE METABOLIC PANEL
ALBUMIN: 2.8 g/dL — AB (ref 3.5–5.0)
ALK PHOS: 62 U/L (ref 38–126)
ALT: 11 U/L — ABNORMAL LOW (ref 14–54)
ANION GAP: 10 (ref 5–15)
AST: 16 U/L (ref 15–41)
BUN: 22 mg/dL — ABNORMAL HIGH (ref 6–20)
CALCIUM: 8.7 mg/dL — AB (ref 8.9–10.3)
CO2: 26 mmol/L (ref 22–32)
CREATININE: 1.83 mg/dL — AB (ref 0.44–1.00)
Chloride: 102 mmol/L (ref 101–111)
GFR calc non Af Amer: 23 mL/min — ABNORMAL LOW (ref >60)
GFR, EST AFRICAN AMERICAN: 27 mL/min — AB (ref >60)
GLUCOSE: 102 mg/dL — AB (ref 65–99)
Potassium: 3.5 mmol/L (ref 3.5–5.1)
Sodium: 138 mmol/L (ref 135–145)
TOTAL PROTEIN: 5.6 g/dL — AB (ref 6.5–8.1)
Total Bilirubin: 0.2 mg/dL — ABNORMAL LOW (ref 0.3–1.2)

## 2015-02-21 LAB — CBC
HEMATOCRIT: 37.4 % (ref 36.0–46.0)
Hemoglobin: 12.2 g/dL (ref 12.0–15.0)
MCH: 28.6 pg (ref 26.0–34.0)
MCHC: 32.6 g/dL (ref 30.0–36.0)
MCV: 87.6 fL (ref 78.0–100.0)
Platelets: 410 10*3/uL — ABNORMAL HIGH (ref 150–400)
RBC: 4.27 MIL/uL (ref 3.87–5.11)
RDW: 14.9 % (ref 11.5–15.5)
WBC: 10.7 10*3/uL — AB (ref 4.0–10.5)

## 2015-02-21 MED ORDER — SODIUM CHLORIDE 0.9 % IV SOLN: INTRAVENOUS | Status: AC

## 2015-02-21 MED ORDER — SODIUM CHLORIDE 0.9 % IV BOLUS (SEPSIS)
500.0000 mL | Freq: Once | INTRAVENOUS | Status: AC
  Administered 2015-02-21: 500 mL via INTRAVENOUS

## 2015-02-21 MED ORDER — POTASSIUM CHLORIDE CRYS ER 20 MEQ PO TBCR
40.0000 meq | EXTENDED_RELEASE_TABLET | Freq: Once | ORAL | Status: AC
  Administered 2015-02-21: 40 meq via ORAL
  Filled 2015-02-21: qty 2

## 2015-02-21 NOTE — ED Provider Notes (Addendum)
CSN: [id number]     Arrival date & time 02/21/15  2040 History   First MD Initiated Contact with Patient 02/21/15 2046     Chief Complaint  Patient presents with  . News new routine      (Consider location/radiation/quality/duration/timing/severity/associated sxs/prior Treatment) The history is provided by the patient.  Patient indicates she would like to check into hospital for awhile so that she can get onto a new routine. She indicates in living w family, meals and meal times can be inconsistent, and states she would like to be here where they can bring her coffee and breakfast.  Pt denies acute medical symptoms. Denies being neglected or abused, indicating she does have safe/secure place to live. Patient c/o  feeling generally weak, and poor/inconsistent po intake. ?dehydration. No headache. No chest pain or sob. No cough or uri c/o. No abd pain. No nvd. No dysuria or gu c/o. Denies depression.     Past Medical History  Diagnosis Date  . Hypertension   . Arthritis   . Gout   . H/O hiatal hernia   . GERD (gastroesophageal reflux disease)   . Bronchitis    Past Surgical History  Procedure Laterality Date  . Appendectomy    . Tee without cardioversion N/A 10/13/2013    Procedure: TRANSESOPHAGEAL ECHOCARDIOGRAM (TEE);  Surgeon: Quintella Reichertraci R Turner, MD;  [facility];  Service: Cardiovascular;  Laterality: N/A;   Family History  Problem Relation Age of Onset  . Cancer Mother   . Stroke Father    History  Substance Use Topics  . Smoking status: Former Smoker -- 0.50 packs/day for 70 years  . Smokeless tobacco: Former NeurosurgeonUser    Quit date: 10/13/2004  . Alcohol Use: No   OB History    No data available     Review of Systems  Constitutional: Negative for fever and chills.  HENT: Negative for sore throat.   Eyes: Negative for redness.  Respiratory: Negative for shortness of breath.   Cardiovascular: Negative for chest pain.  Gastrointestinal: Negative for vomiting,  abdominal pain and diarrhea.  Genitourinary: Negative for dysuria and flank pain.  Musculoskeletal: Negative for back pain and neck pain.  Skin: Negative for rash.  Neurological: Negative for numbness and headaches.  Hematological: Does not bruise/bleed easily.  Psychiatric/Behavioral: Negative for confusion.      Allergies  Penicillins  Home Medications   Prior to Admission medications   Medication Sig Start Date End Date Taking? Authorizing Provider  albuterol (PROVENTIL HFA;VENTOLIN HFA) 108 (90 BASE) MCG/ACT inhaler Inhale 2 puffs into the lungs every 4 (four) hours as needed for wheezing or shortness of breath. 11/28/14   Azalia BilisKevin Campos, MD  allopurinol (ZYLOPRIM) 300 MG tablet Take 450 mg by mouth daily.     Historical Provider, MD  amLODipine (NORVASC) 10 MG tablet Take 10 mg by mouth daily.  06/07/14   Historical Provider, MD  aspirin EC 325 MG tablet Take 325 mg by mouth every other day.    Historical Provider, MD  atenolol (TENORMIN) 25 MG tablet Take 50 mg by mouth daily.     Historical Provider, MD  atorvastatin (LIPITOR) 20 MG tablet Take 20 mg by mouth at bedtime.    Historical Provider, MD  budesonide (PULMICORT) 0.5 MG/2ML nebulizer solution Take 2 mLs (0.5 mg total) by nebulization 2 (two) times daily. 11/28/14   Azalia BilisKevin Campos, MD  ipratropium-albuterol (DUONEB) 0.5-2.5 (3) MG/3ML SOLN Take 3 mLs by nebulization every 6 (six) hours as needed. 11/28/14  Azalia Bilis, MD  losartan (COZAAR) 100 MG tablet Take 100 mg by mouth daily.  06/28/14   Historical Provider, MD   BP 111/58 mmHg  Pulse 70  Temp(Src) 97.6 F (36.4 C) (Oral)  Resp 18  SpO2 98% Physical Exam  Constitutional: She appears well-developed and well-nourished. No distress.  HENT:  Mouth/Throat: Oropharynx is clear and moist.  Eyes: Conjunctivae are normal. No scleral icterus.  Neck: Neck supple. No tracheal deviation present.  Cardiovascular: Normal rate, regular rhythm, normal heart sounds and intact  distal pulses.   Pulmonary/Chest: Effort normal and breath sounds normal. No respiratory distress.  Abdominal: Soft. Normal appearance and bowel sounds are normal. She exhibits no distension. There is no tenderness.  Genitourinary:  No cva tenderness  Musculoskeletal: She exhibits no edema or tenderness.  Neurological: She is alert.  Alert, oriented to person, place. Speech clear/fluent. Motor intact bil. stre 5/5.   Skin: Skin is warm and dry. No rash noted. She is not diaphoretic.  Psychiatric: She has a normal mood and affect.  Nursing note and vitals reviewed.   ED Course  Procedures (including critical care time) Labs Review   Results for orders placed or performed during the hospital encounter of 02/21/15  Comprehensive metabolic panel  Result Value Ref Range   Sodium 138 135 - 145 mmol/L   Potassium 3.5 3.5 - 5.1 mmol/L   Chloride 102 101 - 111 mmol/L   CO2 26 22 - 32 mmol/L   Glucose, Bld 102 (H) 65 - 99 mg/dL   BUN 22 (H) 6 - 20 mg/dL   Creatinine, Ser 4.09 (H) 0.44 - 1.00 mg/dL   Calcium 8.7 (L) 8.9 - 10.3 mg/dL   Total Protein 5.6 (L) 6.5 - 8.1 g/dL   Albumin 2.8 (L) 3.5 - 5.0 g/dL   AST 16 15 - 41 U/L   ALT 11 (L) 14 - 54 U/L   Alkaline Phosphatase 62 38 - 126 U/L   Total Bilirubin 0.2 (L) 0.3 - 1.2 mg/dL   GFR calc non Af Amer 23 (L) >60 mL/min   GFR calc Af Amer 27 (L) >60 mL/min   Anion gap 10 5 - 15  CBC  Result Value Ref Range   WBC 10.7 (H) 4.0 - 10.5 K/uL   RBC 4.27 3.87 - 5.11 MIL/uL   Hemoglobin 12.2 12.0 - 15.0 g/dL   HCT 81.1 91.4 - 78.2 %   MCV 87.6 78.0 - 100.0 fL   MCH 28.6 26.0 - 34.0 pg   MCHC 32.6 30.0 - 36.0 g/dL   RDW 95.6 21.3 - 08.6 %   Platelets 410 (H) 150 - 400 K/uL  I-stat chem 8, ed  Result Value Ref Range   Sodium 140 135 - 145 mmol/L   Potassium 3.4 (L) 3.5 - 5.1 mmol/L   Chloride 101 101 - 111 mmol/L   BUN 26 (H) 6 - 20 mg/dL   Creatinine, Ser 5.78 (H) 0.44 - 1.00 mg/dL   Glucose, Bld 469 (H) 65 - 99 mg/dL   Calcium,  Ion 6.29 5.28 - 1.30 mmol/L   TCO2 24 0 - 100 mmol/L   Hemoglobin 13.3 12.0 - 15.0 g/dL   HCT 41.3 24.4 - 01.0 %       MDM  Labs.  Iv fluid bolus.  Reviewed nursing notes and prior charts for additional history.   AKI on labs, with increased in creatinine from recent baseline .9 to 1.8.  ua still pending.  Med service contacted for  admission re aki/dehydration.  Pt may benefit from SW consult during stay to eval home situation.  Hospitalists consulted for admission/obs.   Discussed w Dr Lovell Sheehan - will place temp orders for med surg bed.       Cathren Laine, MD 02/21/15 [phone]

## 2015-02-21 NOTE — ED Notes (Signed)
Pt states that she wants a new routine in life, something new for breakfast and lunch everyday, states she does not want to stay up all night cause she falls a lot, pt states that she does not want to go to a nursing home or assisted living because she wants to be able to plant her tomato plants and flowers. States she takes care of her brother who she lives who.

## 2015-02-21 NOTE — ED Notes (Signed)
Pt got up to bedside commode to void, refused in and out

## 2015-02-21 NOTE — ED Notes (Signed)
Patient states, " there is nothing woring with me I just need a new routine". " I want to stay here in a big bed" " I want to know if I can have coffee everyday" " Sometimes I want creamer in my coffee sometimes I dont." " patient denies any pain states she has no medical complaints. Ask patient if she has a place to stay" Patient states she stays with her brother and refuses to go to a nursing home. She just wants to stay at the hospital.

## 2015-02-22 ENCOUNTER — Encounter (HOSPITAL_COMMUNITY): Payer: Self-pay | Admitting: Internal Medicine

## 2015-02-22 ENCOUNTER — Observation Stay (HOSPITAL_COMMUNITY): Payer: Medicare HMO

## 2015-02-22 DIAGNOSIS — Z7982 Long term (current) use of aspirin: Secondary | ICD-10-CM | POA: Diagnosis not present

## 2015-02-22 DIAGNOSIS — R627 Adult failure to thrive: Secondary | ICD-10-CM | POA: Diagnosis present

## 2015-02-22 DIAGNOSIS — M1990 Unspecified osteoarthritis, unspecified site: Secondary | ICD-10-CM | POA: Diagnosis present

## 2015-02-22 DIAGNOSIS — J189 Pneumonia, unspecified organism: Secondary | ICD-10-CM | POA: Diagnosis not present

## 2015-02-22 DIAGNOSIS — J690 Pneumonitis due to inhalation of food and vomit: Secondary | ICD-10-CM | POA: Diagnosis not present

## 2015-02-22 DIAGNOSIS — Z880 Allergy status to penicillin: Secondary | ICD-10-CM | POA: Diagnosis not present

## 2015-02-22 DIAGNOSIS — Z87891 Personal history of nicotine dependence: Secondary | ICD-10-CM | POA: Diagnosis not present

## 2015-02-22 DIAGNOSIS — K219 Gastro-esophageal reflux disease without esophagitis: Secondary | ICD-10-CM | POA: Diagnosis present

## 2015-02-22 DIAGNOSIS — Z9049 Acquired absence of other specified parts of digestive tract: Secondary | ICD-10-CM | POA: Diagnosis present

## 2015-02-22 DIAGNOSIS — Z7952 Long term (current) use of systemic steroids: Secondary | ICD-10-CM | POA: Diagnosis not present

## 2015-02-22 DIAGNOSIS — N179 Acute kidney failure, unspecified: Secondary | ICD-10-CM | POA: Diagnosis not present

## 2015-02-22 DIAGNOSIS — E41 Nutritional marasmus: Secondary | ICD-10-CM | POA: Diagnosis not present

## 2015-02-22 DIAGNOSIS — J449 Chronic obstructive pulmonary disease, unspecified: Secondary | ICD-10-CM | POA: Diagnosis present

## 2015-02-22 DIAGNOSIS — E860 Dehydration: Secondary | ICD-10-CM | POA: Diagnosis present

## 2015-02-22 DIAGNOSIS — I10 Essential (primary) hypertension: Secondary | ICD-10-CM | POA: Diagnosis present

## 2015-02-22 DIAGNOSIS — M109 Gout, unspecified: Secondary | ICD-10-CM | POA: Diagnosis present

## 2015-02-22 DIAGNOSIS — E43 Unspecified severe protein-calorie malnutrition: Secondary | ICD-10-CM | POA: Diagnosis present

## 2015-02-22 DIAGNOSIS — R531 Weakness: Secondary | ICD-10-CM | POA: Diagnosis present

## 2015-02-22 DIAGNOSIS — K449 Diaphragmatic hernia without obstruction or gangrene: Secondary | ICD-10-CM | POA: Diagnosis present

## 2015-02-22 DIAGNOSIS — Z681 Body mass index (BMI) 19 or less, adult: Secondary | ICD-10-CM | POA: Diagnosis not present

## 2015-02-22 LAB — BASIC METABOLIC PANEL
ANION GAP: 7 (ref 5–15)
BUN: 18 mg/dL (ref 6–20)
CO2: 24 mmol/L (ref 22–32)
Calcium: 8 mg/dL — ABNORMAL LOW (ref 8.9–10.3)
Chloride: 107 mmol/L (ref 101–111)
Creatinine, Ser: 1.46 mg/dL — ABNORMAL HIGH (ref 0.44–1.00)
GFR calc non Af Amer: 30 mL/min — ABNORMAL LOW (ref >60)
GFR, EST AFRICAN AMERICAN: 35 mL/min — AB (ref >60)
GLUCOSE: 124 mg/dL — AB (ref 65–99)
Potassium: 3.5 mmol/L (ref 3.5–5.1)
SODIUM: 138 mmol/L (ref 135–145)

## 2015-02-22 LAB — CBC
HEMATOCRIT: 33.1 % — AB (ref 36.0–46.0)
Hemoglobin: 10.6 g/dL — ABNORMAL LOW (ref 12.0–15.0)
MCH: 28.3 pg (ref 26.0–34.0)
MCHC: 32 g/dL (ref 30.0–36.0)
MCV: 88.3 fL (ref 78.0–100.0)
Platelets: 375 10*3/uL (ref 150–400)
RBC: 3.75 MIL/uL — AB (ref 3.87–5.11)
RDW: 15 % (ref 11.5–15.5)
WBC: 9.2 10*3/uL (ref 4.0–10.5)

## 2015-02-22 LAB — URINALYSIS, ROUTINE W REFLEX MICROSCOPIC
Bilirubin Urine: NEGATIVE
GLUCOSE, UA: NEGATIVE mg/dL
HGB URINE DIPSTICK: NEGATIVE
Ketones, ur: NEGATIVE mg/dL
Leukocytes, UA: NEGATIVE
Nitrite: NEGATIVE
PROTEIN: NEGATIVE mg/dL
Specific Gravity, Urine: 1.015 (ref 1.005–1.030)
Urobilinogen, UA: 0.2 mg/dL (ref 0.0–1.0)
pH: 5 (ref 5.0–8.0)

## 2015-02-22 LAB — TSH: TSH: 1.873 u[IU]/mL (ref 0.350–4.500)

## 2015-02-22 MED ORDER — ENOXAPARIN SODIUM 30 MG/0.3ML ~~LOC~~ SOLN
30.0000 mg | SUBCUTANEOUS | Status: DC
  Administered 2015-02-22 – 2015-02-25 (×4): 30 mg via SUBCUTANEOUS
  Filled 2015-02-22 (×4): qty 0.3

## 2015-02-22 MED ORDER — IPRATROPIUM-ALBUTEROL 0.5-2.5 (3) MG/3ML IN SOLN
3.0000 mL | Freq: Three times a day (TID) | RESPIRATORY_TRACT | Status: DC
  Administered 2015-02-22 – 2015-02-24 (×7): 3 mL via RESPIRATORY_TRACT
  Filled 2015-02-22 (×8): qty 3

## 2015-02-22 MED ORDER — CEFTRIAXONE SODIUM IN DEXTROSE 20 MG/ML IV SOLN
1.0000 g | INTRAVENOUS | Status: DC
  Administered 2015-02-22 – 2015-02-24 (×3): 1 g via INTRAVENOUS
  Filled 2015-02-22 (×3): qty 50

## 2015-02-22 MED ORDER — HYDROMORPHONE HCL 1 MG/ML IJ SOLN: 0.5000 mg | INTRAMUSCULAR | Status: DC | PRN

## 2015-02-22 MED ORDER — ALUM & MAG HYDROXIDE-SIMETH 200-200-20 MG/5ML PO SUSP
30.0000 mL | Freq: Four times a day (QID) | ORAL | Status: DC | PRN
  Administered 2015-02-24: 30 mL via ORAL
  Filled 2015-02-22: qty 30

## 2015-02-22 MED ORDER — ONDANSETRON HCL 4 MG PO TABS: 4.0000 mg | ORAL_TABLET | Freq: Four times a day (QID) | ORAL | Status: DC | PRN

## 2015-02-22 MED ORDER — ENSURE ENLIVE PO LIQD
237.0000 mL | Freq: Two times a day (BID) | ORAL | Status: DC
  Administered 2015-02-22 – 2015-02-25 (×7): 237 mL via ORAL

## 2015-02-22 MED ORDER — ATORVASTATIN CALCIUM 20 MG PO TABS
20.0000 mg | ORAL_TABLET | Freq: Every day | ORAL | Status: DC
  Administered 2015-02-22: 20 mg via ORAL
  Filled 2015-02-22: qty 1

## 2015-02-22 MED ORDER — BUDESONIDE 0.5 MG/2ML IN SUSP
0.5000 mg | Freq: Two times a day (BID) | RESPIRATORY_TRACT | Status: DC
  Administered 2015-02-22 – 2015-02-25 (×6): 0.5 mg via RESPIRATORY_TRACT
  Filled 2015-02-22 (×7): qty 2

## 2015-02-22 MED ORDER — ACETAMINOPHEN 650 MG RE SUPP: 650.0000 mg | Freq: Four times a day (QID) | RECTAL | Status: DC | PRN

## 2015-02-22 MED ORDER — ALLOPURINOL 300 MG PO TABS
450.0000 mg | ORAL_TABLET | Freq: Every day | ORAL | Status: DC
  Administered 2015-02-22 – 2015-02-25 (×4): 450 mg via ORAL
  Filled 2015-02-22 (×4): qty 2

## 2015-02-22 MED ORDER — GUAIFENESIN ER 600 MG PO TB12
600.0000 mg | ORAL_TABLET | Freq: Two times a day (BID) | ORAL | Status: DC
  Administered 2015-02-22 – 2015-02-25 (×7): 600 mg via ORAL
  Filled 2015-02-22 (×7): qty 1

## 2015-02-22 MED ORDER — OXYCODONE HCL 5 MG PO TABS: 5.0000 mg | ORAL_TABLET | ORAL | Status: DC | PRN

## 2015-02-22 MED ORDER — ADULT MULTIVITAMIN W/MINERALS CH
1.0000 | ORAL_TABLET | Freq: Every day | ORAL | Status: DC
  Administered 2015-02-22 – 2015-02-24 (×3): 1 via ORAL
  Filled 2015-02-22 (×3): qty 1

## 2015-02-22 MED ORDER — ONDANSETRON HCL 4 MG/2ML IJ SOLN: 4.0000 mg | Freq: Four times a day (QID) | INTRAMUSCULAR | Status: DC | PRN

## 2015-02-22 MED ORDER — AMLODIPINE BESYLATE 10 MG PO TABS
10.0000 mg | ORAL_TABLET | Freq: Every day | ORAL | Status: DC
  Administered 2015-02-23: 10 mg via ORAL
  Filled 2015-02-22 (×2): qty 1

## 2015-02-22 MED ORDER — SODIUM CHLORIDE 0.9 % IV SOLN
INTRAVENOUS | Status: AC
  Administered 2015-02-22 (×2): via INTRAVENOUS

## 2015-02-22 MED ORDER — DEXTROSE 5 % IV SOLN
500.0000 mg | INTRAVENOUS | Status: DC
  Administered 2015-02-22 – 2015-02-23 (×2): 500 mg via INTRAVENOUS
  Filled 2015-02-22 (×3): qty 500

## 2015-02-22 MED ORDER — ATENOLOL 50 MG PO TABS
50.0000 mg | ORAL_TABLET | Freq: Every day | ORAL | Status: DC
  Administered 2015-02-22 – 2015-02-24 (×2): 50 mg via ORAL
  Filled 2015-02-22 (×3): qty 1

## 2015-02-22 MED ORDER — ASPIRIN EC 325 MG PO TBEC
325.0000 mg | DELAYED_RELEASE_TABLET | ORAL | Status: DC
  Administered 2015-02-23 – 2015-02-25 (×2): 325 mg via ORAL
  Filled 2015-02-22 (×3): qty 1

## 2015-02-22 MED ORDER — FAMOTIDINE 20 MG PO TABS
20.0000 mg | ORAL_TABLET | Freq: Every day | ORAL | Status: DC
  Administered 2015-02-22 – 2015-02-25 (×4): 20 mg via ORAL
  Filled 2015-02-22 (×4): qty 1

## 2015-02-22 MED ORDER — ACETAMINOPHEN 325 MG PO TABS
650.0000 mg | ORAL_TABLET | Freq: Four times a day (QID) | ORAL | Status: DC | PRN
  Administered 2015-02-22: 650 mg via ORAL
  Filled 2015-02-22: qty 2

## 2015-02-22 NOTE — Clinical Social Work Note (Addendum)
PT is recommending patient have home health PT case manager made aware.  PT also noted that patient may be interested in finding out more information about Assisted Living Facilities (ALFs).  CSW spoke to patient and gave her a [place] of [place].  Patient was appreciative and said she will look at the list and my find somewhere she wants to go.  Patient did not have any other questions, and was appreciative of the list of ALFs, this CSW to sign off please reconsult if other social work needs arise.  Ervin KnackEric R. Hyman Crossan, MSW, Theresia MajorsLCSWA [phone] 02/22/2015 4:58 PM

## 2015-02-22 NOTE — Evaluation (Signed)
Physical Therapy Evaluation Patient Details Name: [name] MRN: [id number] DOB: 09-22-1922 Today's Date: 02/22/2015   History of Present Illness  Pt is a 81 y/o female with a PMH of HTN, gout, arthritis, h/o hiatal hernia, and bronchitis. She presented to the [facility] with complaints of feeling bad and weakness with poor oral intake. She was evaluated in the ED and was found to have an increase in her BUN/Cr and was referred for admission.   Clinical Impression  Pt admitted with above diagnosis. Pt currently with functional limitations due to the deficits listed below (see PT Problem List). At the time of PT eval pt was able to perform transfers with mod I and ambulation with min assist. Pt may benefit from a RW but will need gait training. It was unclear if home situation details are current, and how much assist is available at d/c. At this point I feel the pt would benefit from an ALF environment. Pt will benefit from skilled PT to increase their independence and safety with mobility to allow discharge to the venue listed below.       Follow Up Recommendations Home health PT;Other (comment) (ALF)    Equipment Recommendations  Rolling walker with 5" wheels    Recommendations for Other Services       Precautions / Restrictions Precautions Precautions: Fall Precaution Comments: Pt reports she falls out of bed often at home, especially when she has nightmares Restrictions Weight Bearing Restrictions: No      Mobility  Bed Mobility Overal bed mobility: Modified Independent             General bed mobility comments: Pt transitioned from supine straight to standing with no rest to sit EOB as originally instructed. No UE use and no physical assist required.   Transfers Overall transfer level: Modified independent Equipment used: None             General transfer comment: No physical assist required.   Ambulation/Gait Ambulation/Gait assistance: Min assist Ambulation  Distance (Feet): 150 Feet Assistive device: None Gait Pattern/deviations: Step-through pattern;Decreased stride length;Trunk flexed Gait velocity: Decreased Gait velocity interpretation: Below normal speed for age/gender General Gait Details: Occasional min assist for balance and safety. Pt very unsteady at times and could benefit from an AD.   Stairs            Wheelchair Mobility    Modified Rankin (Stroke Patients Only)       Balance Overall balance assessment: Needs assistance Sitting-balance support: Feet supported;No upper extremity supported Sitting balance-Leahy Scale: Normal     Standing balance support: No upper extremity supported;During functional activity Standing balance-Leahy Scale: Poor                               Pertinent Vitals/Pain Pain Assessment: No/denies pain    Home Living Family/patient expects to be discharged to:: Private residence Living Arrangements: Non-relatives/Friends Available Help at Discharge: Family;Friend(s);Available PRN/intermittently Type of Home: Apartment Home Access: Level entry     Home Layout: One level Home Equipment: Cane - single point Additional Comments: Pt states her friend/roommate will be available to assist her if needed at home.     Prior Function Level of Independence: Independent         Comments: independent with mobility per pt, assist for household chores     Hand Dominance   Dominant Hand: Right    Extremity/Trunk Assessment   Upper Extremity Assessment:  Defer to OT evaluation           Lower Extremity Assessment: Generalized weakness (Likely close to baseline)      Cervical / Trunk Assessment: Kyphotic  Communication   Communication: No difficulties  Cognition Arousal/Alertness: Awake/alert Behavior During Therapy: WFL for tasks assessed/performed Overall Cognitive Status: No family/caregiver present to determine baseline cognitive functioning Area of  Impairment: Safety/judgement         Safety/Judgement: Decreased awareness of safety;Decreased awareness of deficits     General Comments: Pt answered all orientation questions correctly, however frequently made comments that were out of context with previous subject. I was confused with what exactly she was talking about for most of the session.     General Comments      Exercises        Assessment/Plan    PT Assessment Patient needs continued PT services  PT Diagnosis Difficulty walking;Generalized weakness   PT Problem List Decreased strength;Decreased range of motion;Decreased activity tolerance;Decreased mobility;Decreased balance;Decreased safety awareness;Decreased knowledge of use of DME;Decreased knowledge of precautions  PT Treatment Interventions DME instruction;Gait training;Stair training;Functional mobility training;Therapeutic activities;Therapeutic exercise;Neuromuscular re-education;Patient/family education   PT Goals (Current goals can be found in the Care Plan section) Acute Rehab PT Goals Patient Stated Goal: Rest in bed PT Goal Formulation: With patient Time For Goal Achievement: 03/01/15 Potential to Achieve Goals: Good    Frequency Min 3X/week   Barriers to discharge Decreased caregiver support Unclear if pt will have 24 hour assist at d/c.    Co-evaluation               End of Session Equipment Utilized During Treatment: Gait belt Activity Tolerance: Patient tolerated treatment well Patient left: in bed;with bed alarm set;with call bell/phone within reach Nurse Communication: Mobility status    Functional Assessment Tool Used: Clinical judgement Functional Limitation: Mobility: Walking and moving around Mobility: Walking and Moving Around Current Status ([id number]): At least 1 percent but less than 20 percent impaired, limited or restricted Mobility: Walking and Moving Around Goal Status [phone]([id number]): At least 1 percent but less than 20 percent  impaired, limited or restricted    Time: 1422-1440 PT Time Calculation (min) (ACUTE ONLY): 18 min   Charges:   PT Evaluation $Initial PT Evaluation Tier I: 1 Procedure     PT G Codes:   PT G-Codes **NOT FOR INPATIENT CLASS** Functional Assessment Tool Used: Clinical judgement Functional Limitation: Mobility: Walking and moving around Mobility: Walking and Moving Around Current Status ([id number](G8978): At least 1 percent but less than 20 percent impaired, limited or restricted Mobility: Walking and Moving Around Goal Status [phone]([id number]): At least 1 percent but less than 20 percent impaired, limited or restricted    Conni SlipperKirkman, Mabry Santarelli 02/22/2015, 3:07 PM   Conni SlipperLaura Ryka Beighley, PT, DPT Acute Rehabilitation Services Pager: [phone]

## 2015-02-22 NOTE — H&P (Signed)
[facility] Admission History and Physical       [name] ZOX:[id number] DOB: 02/08/1923 DOA: 02/21/2015  Referring physician: EDP PCP: [name], [name], MD  Specialists:   Chief Complaint: Weakness  HPI: [name] is a 81 y.o. female with a history of HTN, Arthritis who presents to the ED because she reports has felt bad , has had weakness and poor intake of foods and liquids.   She denies any fevers ore chills or nausea or vomiting or diarrhea.  She was evaluated in the ED and was found to have an increase in her BUN/Cr and was referred for admission.     Review of Systems:  Constitutional: No Weight Loss, No Weight Gain, Night Sweats, Fevers, Chills, Dizziness, Light Headedness, Fatigue, +Generalized Weakness HEENT: No Headaches, Difficulty Swallowing,Tooth/Dental Problems,Sore Throat,  No Sneezing, Rhinitis, Ear Ache, Nasal Congestion, or Post Nasal Drip,  Cardio-vascular:  No Chest pain, Orthopnea, PND, Edema in Lower Extremities, Anasarca, Dizziness, Palpitations  Resp: No Dyspnea, No DOE, No Productive Cough, No Non-Productive Cough, No Hemoptysis, No Wheezing.    GI: No Heartburn, Indigestion, Abdominal Pain, Nausea, Vomiting, Diarrhea, Constipation, Hematemesis, Hematochezia, Melena, Change in Bowel Habits,  Loss of Appetite  GU: No Dysuria, No Change in Color of Urine, No Urgency or Urinary Frequency, No Flank pain.  Musculoskeletal: No Joint Pain or Swelling, No Decreased Range of Motion, No Back Pain.  Neurologic: No Syncope, No Seizures, Muscle Weakness, Paresthesia, Vision Disturbance or Loss, No Diplopia, No Vertigo, No Difficulty Walking,  Skin: No Rash or Lesions. Psych: No Change in Mood or Affect, No Depression or Anxiety, No Memory loss, No Confusion, or Hallucinations   Past Medical History  Diagnosis Date  . Hypertension   . Arthritis   . Gout   . H/O hiatal hernia   . GERD (gastroesophageal reflux disease)   . Bronchitis     Past Surgical  History  Procedure Laterality Date  . Appendectomy    . Tee without cardioversion N/A 10/13/2013    Procedure: TRANSESOPHAGEAL ECHOCARDIOGRAM (TEE);  Surgeon: Quintella Reichertraci R Turner, MD;  [facility];  Service: Cardiovascular;  Laterality: N/A;      Prior to Admission medications   Medication Sig Start Date End Date Taking? Authorizing Provider  albuterol (PROVENTIL HFA;VENTOLIN HFA) 108 (90 BASE) MCG/ACT inhaler Inhale 2 puffs into the lungs every 4 (four) hours as needed for wheezing or shortness of breath. 11/28/14  Yes Azalia BilisKevin Campos, MD  allopurinol (ZYLOPRIM) 300 MG tablet Take 450 mg by mouth daily.    Yes Historical Provider, MD  amLODipine (NORVASC) 10 MG tablet Take 10 mg by mouth daily.  06/07/14  Yes Historical Provider, MD  aspirin EC 325 MG tablet Take 325 mg by mouth every other day.   Yes Historical Provider, MD  atenolol (TENORMIN) 25 MG tablet Take 50 mg by mouth at bedtime.    Yes Historical Provider, MD  atorvastatin (LIPITOR) 20 MG tablet Take 20 mg by mouth at bedtime.   Yes Historical Provider, MD  budesonide (PULMICORT) 0.5 MG/2ML nebulizer solution Take 2 mLs (0.5 mg total) by nebulization 2 (two) times daily. 11/28/14  Yes Azalia BilisKevin Campos, MD  ipratropium-albuterol (DUONEB) 0.5-2.5 (3) MG/3ML SOLN Take 3 mLs by nebulization every 6 (six) hours as needed. 11/28/14  Yes Azalia BilisKevin Campos, MD  levofloxacin (LEVAQUIN) 500 MG tablet Take 500 mg by mouth daily. Started 02/14/15, for 7 days ending 02/21/15 02/13/15  Yes Historical Provider, MD  losartan (COZAAR) 100 MG tablet Take  100 mg by mouth every morning.  06/28/14  Yes Historical Provider, MD  predniSONE (DELTASONE) 10 MG tablet Take 10-60 mg by mouth daily.  02/15/15   Historical Provider, MD     Allergies  Allergen Reactions  . Penicillins Rash    Tolerated ceftriaxone    Social History:  reports that she has quit smoking. She quit smokeless tobacco use about 10 years ago. She reports that she does not drink alcohol or use  illicit drugs.    Family History  Problem Relation Age of Onset  . Cancer Mother   . Stroke Father   . Cirrhosis Brother        Physical Exam:  GEN:  Pleasant Cachectic Elderly 81 y.o. African American female examined and in no acute distress; cooperative with exam Filed Vitals:   02/21/15 2145 02/21/15 2230 02/21/15 2245 02/21/15 2330  BP: 105/61 103/56 102/57 93/65  Pulse: 68 69 69 75  Temp:      TempSrc:      Resp:      SpO2: 97% 99% 99% 98%   Blood pressure 93/65, pulse 75, temperature 97.6 F (36.4 C), temperature source Oral, resp. rate 18, SpO2 98 %. PSYCH: She is alert and oriented x4; does not appear anxious does not appear depressed; affect is normal HEENT: Normocephalic and Atraumatic, Mucous membranes pink; PERRLA; EOM intact; Fundi:  Benign;  No scleral icterus, Nares: Patent, Oropharynx: Clear, Sparse Dentition,    Neck:  FROM, No Cervical Lymphadenopathy nor Thyromegaly or Carotid Bruit; No JVD; Breasts:: Not examined CHEST WALL: No tenderness CHEST: Normal respiration, clear to auscultation bilaterally HEART: Regular rate and rhythm; no murmurs rubs or gallops BACK: No kyphosis or scoliosis; No CVA tenderness ABDOMEN: Positive Bowel Sounds, Scaphoid, Soft Non-Tender, No Rebound or Guarding; No Masses, No Organomegaly. Rectal Exam: Not done EXTREMITIES: No Cyanosis, Clubbing, or Edema; No Ulcerations. Genitalia: not examined PULSES: 2+ and symmetric SKIN: Normal hydration no rash or ulceration CNS:  Alert and Oriented x 4, No Focal Deficits Vascular: pulses palpable throughout    Labs on Admission:  Basic Metabolic Panel:  Recent Labs Lab 02/21/15 2157 02/21/15 2218  NA 140 138  K 3.4* 3.5  CL 101 102  CO2  --  26  GLUCOSE 102* 102*  BUN 26* 22*  CREATININE 1.80* 1.83*  CALCIUM  --  8.7*   Liver Function Tests:  Recent Labs Lab 02/21/15 2218  AST 16  ALT 11*  ALKPHOS 62  BILITOT 0.2*  PROT 5.6*  ALBUMIN 2.8*   No results for  input(s): LIPASE, AMYLASE in the last 168 hours. No results for input(s): AMMONIA in the last 168 hours. CBC:  Recent Labs Lab 02/21/15 2157 02/21/15 2218  WBC  --  10.7*  HGB 13.3 12.2  HCT 39.0 37.4  MCV  --  87.6  PLT  --  410*   Cardiac Enzymes: No results for input(s): CKTOTAL, CKMB, CKMBINDEX, TROPONINI in the last 168 hours.  BNP (last 3 results)  Recent Labs  09/18/14 1149 10/03/14 1140  BNP 51.6 24.5    ProBNP (last 3 results) No results for input(s): PROBNP in the last 8760 hours.  CBG: No results for input(s): GLUCAP in the last 168 hours.  Radiological Exams on Admission: No results found.     Assessment/Plan:   81 y.o. female with  Principal Problem:   1.    Dehydration   IVFs   Active Problems:   2.   AKI (acute kidney injury)-  Baseline Cr =0.9- 1.0   IVFs   Hold ARB Rx   Monitor BUN/Cr     3.   HTN (hypertension)   Continue Atenolol, and Amlodipine as BP tolerates    Holding ARB RX for Now due to #2     4.   COLD (chronic obstructive lung disease)   Duonebs PRN     5.   Arthritis   PRN Pain Rx     6.   DVT Prophylaxis   Lovenox         Code Status:     FULL CODE    Family Communication:   Family at Bedside   Disposition Plan:  Observation Status        Time spent:  3 Minutes      Ron Parker [facility] Pager [phone]   If 7AM -7PM Please Contact the Day Rounding Team MD for [facility]  If 7PM-7AM, Please Contact Night-Floor Coverage  [email or web address] Password [email or web address] 02/22/2015, 12:13 AM     ADDENDUM:   Patient was seen and examined on 02/22/2015

## 2015-02-22 NOTE — Progress Notes (Signed)
Initial Nutrition Assessment  DOCUMENTATION CODES:  Severe malnutrition in context of chronic illness  INTERVENTION:  -Ensure Enlive po BID, each supplement provides 350 kcal and 20 grams of protein -MVI daily  NUTRITION DIAGNOSIS:  Malnutrition related to chronic illness as evidenced by severe depletion of muscle mass, severe fluid accumulation.  GOAL:  Patient will meet greater than or equal to 90% of their needs  MONITOR:  PO intake, Supplement acceptance, Labs, Weight trends, Skin, I & O's  REASON FOR ASSESSMENT:  Malnutrition Screening Tool    ASSESSMENT: [name] is a 81 y.o. female with a history of HTN, Arthritis who presents to the ED because she reports has felt bad , has had weakness and poor intake of foods and liquids. She denies any fevers ore chills or nausea or vomiting or diarrhea. She was evaluated in the ED and was found to have an increase in her BUN/Cr and was referred for admission.  Pt admitted with dehydration.   Pt reports she came to the hospital due to weakness. She reports a strong desire to get better. Pt reports that she can tell that she has been losing weight because she can "see all my bones in the mirror". Pt endorses weight loss, however, unsure of quantity or time frame. Wt hx reveals wt stability over the past year.   Pt reports appetite has been inconsistent PTA. She reports she was eating 3 meals per day, but decreased to 1-2 "because I wasn't getting what I wanted". PTA breakfast consisted of cereal and daytime meal consisted of pork and beans. She denies any difficulty chewing or swallowing foods, despite several missing teeth.   Pt reports she ate some eggs, pancakes, and coffee for breakfast. She finished consuming strawberry Ensure supplement. Pt reports she likes supplement and would like to continue with these. Discussed importance of good meal and supplement intake to promote healing.   Nutrition-Focused physical exam  completed. Findings are severe fat depletion, severe muscle depletion, and no edema.   Height:  Ht Readings from Last 1 Encounters:  02/22/15 5\' 5"  (1.651 m)    Weight:  Wt Readings from Last 1 Encounters:  02/22/15 107 lb 8 oz (48.762 kg)    Ideal Body Weight:  56.8 kg  Wt Readings from Last 10 Encounters:  02/22/15 107 lb 8 oz (48.762 kg)  11/28/14 114 lb (51.71 kg)  10/06/14 109 lb 2 oz (49.5 kg)  08/21/14 108 lb (48.988 kg)  07/18/14 108 lb 11 oz (49.3 kg)  01/14/14 103 lb 9.6 oz (46.993 kg)  11/01/13 104 lb (47.174 kg)  10/11/13 110 lb 7.2 oz (50.1 kg)  06/14/13 103 lb 6.3 oz (46.9 kg)  12/02/12 107 lb 2.3 oz (48.6 kg)    BMI:  Body mass index is 17.89 kg/(m^2).  Estimated Nutritional Needs:  Kcal:  1200-1400  Protein:  55-65 grams  Fluid:  1.2-1.4 L  Skin:  Reviewed, no issues  Diet Order:  Diet Heart Room service appropriate?: Yes; Fluid consistency:: Thin  EDUCATION NEEDS:  Education needs addressed   Intake/Output Summary (Last 24 hours) at 02/22/15 1152 Last data filed at 02/22/15 0700  Gross per 24 hour  Intake 616.25 ml  Output    450 ml  Net 166.25 ml    Last BM:  02/21/15  Deliah Strehlow A. Mayford KnifeWilliams, RD, LDN, CDE Pager: [phone] After hours Pager: [phone]

## 2015-02-22 NOTE — Clinical Social Work Note (Signed)
CSW received consult for possible SNF placement.  Awaiting PT recommendations for patient, will continue to follow throughout discharge planning.  Ervin KnackEric R. Pauletta Pickney, MSW, Theresia MajorsLCSWA [phone] 02/22/2015 12:14 PM

## 2015-02-22 NOTE — Progress Notes (Addendum)
Patient seen and examined, sitting in chair, reported feeling better, reported cough for the last several days, lung mild crackles at bilateral bases, mild intermittent wheezes, good aeration overall. No edema. Cxr/nebs ordered,. Reported h/o copd, does not want steroids due to "making me nervous". Cr better on hydration,  Continue ivf for now. Addendum: 4:30pm, cxr with left lower lobe airspace disease, start rocephin/zithro for CAP. Repeat cxr in 3weeks by pmd

## 2015-02-22 NOTE — ED Notes (Signed)
Admitting at bedside 

## 2015-02-23 DIAGNOSIS — I10 Essential (primary) hypertension: Secondary | ICD-10-CM

## 2015-02-23 DIAGNOSIS — E41 Nutritional marasmus: Secondary | ICD-10-CM

## 2015-02-23 DIAGNOSIS — E43 Unspecified severe protein-calorie malnutrition: Secondary | ICD-10-CM

## 2015-02-23 DIAGNOSIS — R531 Weakness: Secondary | ICD-10-CM

## 2015-02-23 DIAGNOSIS — J189 Pneumonia, unspecified organism: Secondary | ICD-10-CM

## 2015-02-23 DIAGNOSIS — N179 Acute kidney failure, unspecified: Principal | ICD-10-CM

## 2015-02-23 LAB — BASIC METABOLIC PANEL
Anion gap: 6 (ref 5–15)
BUN: 16 mg/dL (ref 6–20)
CO2: 25 mmol/L (ref 22–32)
Calcium: 8.4 mg/dL — ABNORMAL LOW (ref 8.9–10.3)
Chloride: 111 mmol/L (ref 101–111)
Creatinine, Ser: 1.1 mg/dL — ABNORMAL HIGH (ref 0.44–1.00)
GFR calc Af Amer: 50 mL/min — ABNORMAL LOW (ref >60)
GFR, EST NON AFRICAN AMERICAN: 43 mL/min — AB (ref >60)
GLUCOSE: 96 mg/dL (ref 65–99)
POTASSIUM: 3.9 mmol/L (ref 3.5–5.1)
Sodium: 142 mmol/L (ref 135–145)

## 2015-02-23 LAB — HEMOGLOBIN A1C
HEMOGLOBIN A1C: 6 % — AB (ref 4.8–5.6)
Mean Plasma Glucose: 126 mg/dL

## 2015-02-23 MED ORDER — ATORVASTATIN CALCIUM 10 MG PO TABS
10.0000 mg | ORAL_TABLET | Freq: Every day | ORAL | Status: DC
  Administered 2015-02-23 – 2015-02-24 (×2): 10 mg via ORAL
  Filled 2015-02-23 (×2): qty 1

## 2015-02-23 MED ORDER — SENNOSIDES-DOCUSATE SODIUM 8.6-50 MG PO TABS
2.0000 | ORAL_TABLET | Freq: Two times a day (BID) | ORAL | Status: DC
  Administered 2015-02-23 – 2015-02-25 (×5): 2 via ORAL
  Filled 2015-02-23 (×5): qty 2

## 2015-02-23 MED ORDER — SODIUM CHLORIDE 0.9 % IV SOLN
INTRAVENOUS | Status: AC
  Administered 2015-02-23: 20:00:00 via INTRAVENOUS

## 2015-02-23 MED ORDER — POLYVINYL ALCOHOL 1.4 % OP SOLN
2.0000 [drp] | OPHTHALMIC | Status: DC | PRN
  Administered 2015-02-23: 2 [drp] via OPHTHALMIC
  Filled 2015-02-23: qty 15

## 2015-02-23 NOTE — Evaluation (Signed)
Occupational Therapy Evaluation Patient Details Name: [name] MRN: [id number] DOB: Jan 05, 1923 Today's Date: 02/23/2015    History of Present Illness Pt is a 81 y/o female with a PMH of HTN, gout, arthritis, h/o hiatal hernia, and bronchitis. She presented to the [facility] with complaints of feeling bad and weakness with poor oral intake. She was evaluated in the ED and was found to have an increase in her BUN/Cr and was referred for admission.    Clinical Impression   Pt is at set up - sup level with ADLs and Mod I with ADL mobility. Pt expressed desire to not return home alone and maybe move into an ALF. All education completed and no further acute OT indicated at this time, OT will sign off    Follow Up Recommendations  No OT follow up;Other (comment) (ALF)    Equipment Recommendations  Tub/shower bench    Recommendations for Other Services       Precautions / Restrictions Precautions Precautions: Fall Precaution Comments: Pt reports she falls out of bed often at home, especially when she has nightmares Restrictions Weight Bearing Restrictions: No      Mobility Bed Mobility Overal bed mobility: Modified Independent             General bed mobility comments: Pt transitioned from supine straight to standing with no rest to sit EOB as originally instructed. No UE use and no physical assist required.   Transfers Overall transfer level: Modified independent Equipment used: None             General transfer comment: No physical assist required.     Balance Overall balance assessment: Needs assistance Sitting-balance support: Feet supported;No upper extremity supported Sitting balance-Leahy Scale: Good     Standing balance support: During functional activity Standing balance-Leahy Scale: Fair                              ADL Overall ADL's : Needs assistance/impaired     Grooming: Wash/dry hands;Wash/dry face;Standing;Supervision/safety;Set  up   Upper Body Bathing: Supervision/ safety;Set up;Sitting   Lower Body Bathing: Supervison/ safety;Set up;Sit to/from stand;Sitting/lateral leans   Upper Body Dressing : Supervision/safety;Set up;Standing   Lower Body Dressing: Set up;Supervision/safety;Sit to/from stand;Sitting/lateral leans   Toilet Transfer: Modified Independent;BSC;Regular Toilet;Grab bars;Ambulation   Toileting- Clothing Manipulation and Hygiene: Supervision/safety;Sit to/from stand   Tub/ Engineer, structural: 3 in 1;Grab bars;Supervision/safety   Functional mobility during ADLs: Supervision/safety;Modified independent       Vision  no change from baseline   Perception Perception Perception Tested?: No   Praxis Praxis Praxis tested?: Not tested    Pertinent Vitals/Pain Pain Assessment: No/denies pain     Hand Dominance Right   Extremity/Trunk Assessment Upper Extremity Assessment Upper Extremity Assessment: Generalized weakness   Lower Extremity Assessment Lower Extremity Assessment: Defer to PT evaluation   Cervical / Trunk Assessment Cervical / Trunk Assessment: Kyphotic   Communication Communication Communication: No difficulties   Cognition Arousal/Alertness: Awake/alert Behavior During Therapy: WFL for tasks assessed/performed Overall Cognitive Status: No family/caregiver present to determine baseline cognitive functioning Area of Impairment: Safety/judgement         Safety/Judgement: Decreased awareness of safety;Decreased awareness of deficits     General Comments: Pt answered all orientation questions correctly, however frequently made comments that were out of context with previous subject. I was confused with what exactly she was talking about for most of the session.    General Comments  pt very pleasant and cooperative                 Home Living Family/patient expects to be discharged to:: Private residence Living Arrangements: Non-relatives/Friends Available  Help at Discharge: Family;Friend(s);Available PRN/intermittently Type of Home: Apartment Home Access: Level entry     Home Layout: One level     Bathroom Shower/Tub: Chief Strategy OfficerTub/shower unit   Bathroom Toilet: Standard     Home Equipment: Cane - single point   Additional Comments: Pt states her friend/roommate will be available to assist her if needed at home.       Prior Functioning/Environment Level of Independence: Independent        Comments: independent with mobility per pt, assist for household chores    OT Diagnosis: Generalized weakness   OT Problem List: Decreased activity tolerance   OT Treatment/Interventions:      OT Goals(Current goals can be found in the care plan section) Acute Rehab OT Goals Patient Stated Goal: not stay at home alone anymore OT Goal Formulation: With patient  OT Frequency:     Barriers to D/C:  none, plans to d/c to nephew's home                        End of Session Equipment Utilized During Treatment: Other (comment) (BSC)  Activity Tolerance: Patient tolerated treatment well Patient left: in bed;with call bell/phone within reach   Time: 1114-1135 OT Time Calculation (min): 21 min Charges:  OT General Charges $OT Visit: 1 Procedure OT Evaluation $Initial OT Evaluation Tier I: 1 Procedure OT Treatments $Therapeutic Activity: 8-22 mins G-Codes:    Galen ManilaSpencer, [name] 02/23/2015, 1:02 PM

## 2015-02-23 NOTE — Care Management Note (Addendum)
Case Management Note  Patient Details  Name: [name] MRN: [id number] Date of Birth: 04/08/1923  Subjective/Objective:                    Action/Plan: Patient states she is discharging to her brother's home : [address] [place] , phone [phone]. She already has walker at home. Asked Dr Roda ShuttersXu for HHPT order and face to face . Clydie BraunKaren with [facility] following   Expected Discharge Date:  03/01/15               Expected Discharge Plan:  Home w Home Health Services  In-House Referral:     Discharge planning Services  CM Consult  Post Acute Care Choice:  Home Health Choice offered to:  Patient  DME Arranged:    DME Agency:     HH Arranged:  PT, sw, RN  HH Agency:  [facility]  Status of Service:  In process, will continue to follow  Medicare Important Message Given:    Date Medicare IM Given:    Medicare IM give by:    Date Additional Medicare IM Given:    Additional Medicare Important Message give by:     If discussed at Long Length of Stay Meetings, dates discussed:    Additional Comments:  [name], [name] [name], RN 02/23/2015, 10:07 AM

## 2015-02-23 NOTE — Clinical Social Work Note (Signed)
CSW received consult for SNF, PT and OT are recommending home health, patient could benefit from home health social worker to help patient look for possible future placement in ALF, [facility] or other day program, community resources, and/or in home private pay care agencies.  CSW informed physician that patient may benefit from home health social worker, physician notified case Production designer, theatre/television/filmmanager.  This CSW to sign off, please reconsult if other social work needs arise.  Ervin KnackEric R. Courtney Fenlon, MSW, LCSWA [phone] 02/23/2015 2:00 PM

## 2015-02-23 NOTE — Care Management (Signed)
Important Message  Patient Details  Name: [name] MRN: [id number] Date of Birth: 12/16/1922   Medicare Important Message Given:   02-23-15    [name], [name], RN 02/23/2015, 11:53 AM

## 2015-02-23 NOTE — Progress Notes (Signed)
Physical Therapy Treatment Patient Details Name: Lindsay HammingCallie B Jacques MRN: [id number] DOB: 03/03/1923 Today's Date: 02/23/2015    History of Present Illness Pt is a 81 y/o female with a PMH of HTN, gout, arthritis, h/o hiatal hernia, and bronchitis. [facility] of feeling bad and weakness with poor oral intake. She was evaluated in the ED and was found to have an increase in her BUN/Cr and was referred for admission.  Chest x ray 02/22/15 positive for PNA.    PT Comments    Pt progressing well with mobility, she walked 240' with RW without loss of balance.   Follow Up Recommendations  Home health PT;Other (comment) (ALF)     Equipment Recommendations  Rolling walker with 5" wheels    Recommendations for Other Services       Precautions / Restrictions Precautions Precautions: Fall Precaution Comments: Pt reports she falls out of bed often at home, especially when she has nightmares Restrictions Weight Bearing Restrictions: No    Mobility  Bed Mobility Overal bed mobility: Modified Independent             General bed mobility comments: Pt transitioned from supine straight to standing with no rest to sit EOB as originally instructed. No UE use and no physical assist required.   Transfers Overall transfer level: Modified independent Equipment used: None             General transfer comment: No physical assist required.   Ambulation/Gait Ambulation/Gait assistance: Supervision Ambulation Distance (Feet): 240 Feet Assistive device: Rolling walker (2 wheeled) Gait Pattern/deviations: Step-through pattern   Gait velocity interpretation: at or above normal speed for age/gender General Gait Details: steady with RW, 1/4 dyspnea with walking   Stairs            Wheelchair Mobility    Modified Rankin (Stroke Patients Only)       Balance Overall balance assessment: Needs assistance Sitting-balance support: Feet supported;No upper  extremity supported Sitting balance-Leahy Scale: Good     Standing balance support: During functional activity Standing balance-Leahy Scale: Fair                      Cognition Arousal/Alertness: Awake/alert Behavior During Therapy: WFL for tasks assessed/performed Overall Cognitive Status: Within Functional Limits for tasks assessed Area of Impairment: Safety/judgement         Safety/Judgement: Decreased awareness of safety;Decreased awareness of deficits     General Comments: Pt answered all orientation questions correctly, however frequently made comments that were out of context with previous subject. I was confused with what exactly she was talking about for most of the session.     Exercises      General Comments        Pertinent Vitals/Pain Pain Assessment: No/denies pain    Home Living Family/patient expects to be discharged to:: Private residence Living Arrangements: Non-relatives/Friends Available Help at Discharge: Family;Friend(s);Available PRN/intermittently Type of Home: Apartment Home Access: Level entry   Home Layout: One level Home Equipment: Cane - single point Additional Comments: Pt states her friend/roommate will be available to assist her if needed at home.     Prior Function Level of Independence: Independent      Comments: independent with mobility per pt, assist for household chores   PT Goals (current goals can now be found in the care plan section) Acute Rehab PT Goals Patient Stated Goal: not stay at home alone anymore PT Goal Formulation: With patient Time  For Goal Achievement: 03/01/15 Potential to Achieve Goals: Good Progress towards PT goals: Progressing toward goals    Frequency  Min 3X/week    PT Plan Current plan remains appropriate    Co-evaluation             End of Session Equipment Utilized During Treatment: Gait belt Activity Tolerance: Patient tolerated treatment well Patient left: in bed;with call  bell/phone within reach     Time: 6213-0865 PT Time Calculation (min) (ACUTE ONLY): 10 min  Charges:  $Gait Training: 8-22 mins                    G Codes:      Tamala Ser 02/23/2015, 1:54 PM [phone]

## 2015-02-23 NOTE — Clinical Documentation Improvement (Signed)
Possible Clinical Conditions?  Severe Malnutrition   Protein Calorie Malnutrition Severe Protein Calorie Malnutrition Other Condition Cannot clinically determine  Supporting Information:   Initial Nutrition Assessment by Stana BuntingJenifer A Williams, RD at 02/22/2015 11:37  DOCUMENTATION CODES:  Severe malnutrition in context of chronic illness   INTERVENTION -Ensure Enlive po BID, each supplement provides 350 kcal and 20 grams of protein  -MVI daily   NUTRITION DIAGNOSIS:  Malnutrition related to chronic illness as evidenced by severe depletion of muscle mass, severe fluid accumulation.     Thank You, Nevin BloodgoodJoan B Ladaisha Portillo, RN, BSN, CCDS,Clinical Documentation Specialist:  [phone]  [phone]=[facility] [place] Information Management

## 2015-02-23 NOTE — Progress Notes (Signed)
PROGRESS NOTE  [name]:[id number] DOB: 02/18/1923 DOA: 02/21/2015 PCP: Pearson Grippe, MD  HPI/Recap of past 24 hours:  Feeling better, cough more productive, reported neb treatment helped, grandchildrens in room  Assessment/Plan: Principal Problem:   Dehydration Active Problems:   HTN (hypertension)   COLD (chronic obstructive lung disease)   AKI (acute kidney injury)   Arthritis   Weakness  Community acquired pneumonia: on rocephin/zitrho/mucinex/nebs, improving.  ARF, likely from dehydration, ua unremarkable, cr improving on ivf. Hold cozaar.  H/o HTN: bp low normal. D/c cozaar, norvasc, continue atenolol.  Hiatal hernia/GERD: on pepcid  Gout: stable on allopurinol.  H/o HLD: check lipid panel, decrease lipitor dose.  Severe malnutrition: appreciate nutrition consult, on nutrition supplement.  FTT: PT/OT, home health, patient is interested in ALF/short term rehab, will get social worker to look into it.   Code Status: full  Family Communication: patient and two grandchildren  Disposition Plan: home with home health in 1-2 days   Consultants:  none  Procedures:  none  Antibiotics:  Rocephin/zithromax from 7/7.   Objective: BP 102/58 mmHg  Pulse 92  Temp(Src) 98.7 F (37.1 C) (Oral)  Resp 17  Ht  (1.651 m)  Wt 48.762 kg (107 lb 8 oz)  BMI 17.89 kg/m2  SpO2 98%  Intake/Output Summary (Last 24 hours) at 02/23/15 1425 Last data filed at 02/23/15 1200  Gross per 24 hour  Intake   1395 ml  Output   1850 ml  Net   -455 ml   Filed Weights   02/22/15 0042  Weight: 48.762 kg (107 lb 8 oz)    Exam:   General:  thin and frail elderly, NAD  Cardiovascular: RRR  Respiratory: CTABL  Abdomen: Soft/ND/NT, positive BS  Musculoskeletal: No Edema  Neuro: aaox3, no focal findings.  Data Reviewed: Basic Metabolic Panel:  Recent Labs Lab 02/21/15 2157 02/21/15 2218 02/22/15 0520 02/23/15 0412  NA 140 138 138 142  K 3.4*  3.5 3.5 3.9  CL 101 102 107 111  CO2  --  GLUCOSE 102* 102* 124* 96  BUN 26* 22* 18 16  CREATININE 1.80* 1.83* 1.46* 1.10*  CALCIUM  --  8.7* 8.0* 8.4*   Liver Function Tests:  Recent Labs Lab 02/21/15 2218  AST 16  ALT 11*  ALKPHOS 62  BILITOT 0.2*  PROT 5.6*  ALBUMIN 2.8*   No results for input(s): LIPASE, AMYLASE in the last 168 hours. No results for input(s): AMMONIA in the last 168 hours. CBC:  Recent Labs Lab 02/21/15 2157 02/21/15 2218 02/22/15 0520  WBC  --  10.7* 9.2  HGB 13.3 12.2 10.6*  HCT 39.0 37.4 33.1*  MCV  --  87.6 88.3  PLT  --  410* 375   Cardiac Enzymes:   No results for input(s): CKTOTAL, CKMB, CKMBINDEX, TROPONINI in the last 168 hours. BNP (last 3 results)  Recent Labs  09/18/14 1149 10/03/14 1140  BNP 51.6 24.5    ProBNP (last 3 results) No results for input(s): PROBNP in the last 8760 hours.  CBG: No results for input(s): GLUCAP in the last 168 hours.  No results found for this or any previous visit (from the past 240 hour(s)).   Studies: No results found.  Scheduled Meds: . allopurinol  450 mg Oral Daily  . amLODipine  10 mg Oral Daily  . aspirin EC  325 mg Oral QODAY  . atenolol  50 mg Oral QHS  . atorvastatin  20 mg Oral QHS  . azithromycin  500 mg Intravenous Q24H  . budesonide  0.5 mg Nebulization BID  . cefTRIAXone (ROCEPHIN)  IV  1 g Intravenous Q24H  . enoxaparin (LOVENOX) injection  30 mg Subcutaneous Q24H  . famotidine  20 mg Oral Daily  . feeding supplement (ENSURE ENLIVE)  237 mL Oral BID BM  . guaiFENesin  600 mg Oral BID  . ipratropium-albuterol  3 mL Nebulization 3 times per day  . multivitamin with minerals  1 tablet Oral Daily  . senna-docusate  2 tablet Oral BID    Continuous Infusions: . sodium chloride 75 mL/hr at 02/23/15 [id number]     Time spent: 35mins  Shaquoia Miers MD, PhD  [facility] Pager [phone]. If 7PM-7AM, please contact night-coverage at [email or web address], password  [email or web address] 02/23/2015, 2:25 PM  LOS: 1 day

## 2015-02-24 LAB — BASIC METABOLIC PANEL
Anion gap: 8 (ref 5–15)
BUN: 16 mg/dL (ref 6–20)
CO2: 25 mmol/L (ref 22–32)
Calcium: 8.4 mg/dL — ABNORMAL LOW (ref 8.9–10.3)
Chloride: 106 mmol/L (ref 101–111)
Creatinine, Ser: 0.99 mg/dL (ref 0.44–1.00)
GFR calc non Af Amer: 49 mL/min — ABNORMAL LOW (ref >60)
GFR, EST AFRICAN AMERICAN: 56 mL/min — AB (ref >60)
Glucose, Bld: 86 mg/dL (ref 65–99)
POTASSIUM: 3.7 mmol/L (ref 3.5–5.1)
Sodium: 139 mmol/L (ref 135–145)

## 2015-02-24 LAB — LIPID PANEL
CHOL/HDL RATIO: 2.3 ratio
CHOLESTEROL: 110 mg/dL (ref 0–200)
HDL: 47 mg/dL (ref >40)
LDL CALC: 52 mg/dL (ref 0–99)
Triglycerides: 57 mg/dL (ref <150)
VLDL: 11 mg/dL (ref 0–40)

## 2015-02-24 MED ORDER — AZITHROMYCIN 250 MG PO TABS
500.0000 mg | ORAL_TABLET | ORAL | Status: DC
  Administered 2015-02-24: 500 mg via ORAL
  Filled 2015-02-24: qty 2

## 2015-02-24 MED ORDER — DOXYCYCLINE HYCLATE 100 MG PO TABS
100.0000 mg | ORAL_TABLET | Freq: Two times a day (BID) | ORAL | Status: DC
  Administered 2015-02-24 – 2015-02-25 (×2): 100 mg via ORAL
  Filled 2015-02-24 (×2): qty 1

## 2015-02-24 MED ORDER — IPRATROPIUM-ALBUTEROL 0.5-2.5 (3) MG/3ML IN SOLN: 3.0000 mL | Freq: Four times a day (QID) | RESPIRATORY_TRACT | Status: DC | PRN

## 2015-02-24 MED ORDER — ADULT MULTIVITAMIN W/MINERALS CH
1.0000 | ORAL_TABLET | Freq: Every day | ORAL | Status: DC
  Administered 2015-02-25: 1 via ORAL
  Filled 2015-02-24: qty 1

## 2015-02-24 NOTE — Progress Notes (Signed)
CM spoke with patient and Terie PurserGreat Nephew, [name] at bedside.   Clarified by Fayrene FearingJames of [facility] that Tub bench is not a covered benefit per her coverage.

## 2015-02-24 NOTE — Progress Notes (Signed)
PROGRESS NOTE  [name] ZOX:[id number] DOB: 1923/07/12 DOA: 02/21/2015 PCP: Pearson Grippe, MD  HPI/Recap of past 24 hours:  Still intermittent productive cough , reported neb treatment helped,   Assessment/Plan: Principal Problem:   Dehydration Active Problems:   HTN (hypertension)   COLD (chronic obstructive lung disease)   AKI (acute kidney injury)   Arthritis   Weakness   Severe malnutrition  Community acquired pneumonia:  on rocephin/zitrho/mucinex/nebs, improving. Change to oral doxycycline . Patient reported remote history of dysphagia, but denies trouble swallowing currently, will get swallow eval.  ARF, likely from dehydration, ua unremarkable, cr normalized with  ivf.  Cozaar d/ced.  H/o HTN: bp low normal. D/c cozaar, norvasc, continue atenolol.  Hiatal hernia/GERD: on pepcid  Gout: stable on allopurinol.  H/o HLD: lipid panel wnl, decrease lipitor dose.  Severe malnutrition: appreciate nutrition consult, on nutrition supplement. Change Multivitamins to lunch time to avoid interaction with doxycycline.  FTT: PT/OT, home health, patient is interested in ALF/short term rehab, will get social worker to look into it.   Code Status: full, verified with patient  Family Communication: patient   Disposition Plan: home with home health in 1-2 days   Consultants:  none  Procedures:  none  Antibiotics:  Rocephin/zithromax from 7/7.   Objective: BP 129/60 mmHg  Pulse 77  Temp(Src) 98 F (36.7 C) (Oral)  Resp 16  Ht  (1.651 m)  Wt 48.762 kg (107 lb 8 oz)  BMI 17.89 kg/m2  SpO2 96%  Intake/Output Summary (Last 24 hours) at 02/24/15 1517 Last data filed at 02/24/15 1442  Gross per 24 hour  Intake 2073.25 ml  Output   2750 ml  Net -676.75 ml   Filed Weights   02/22/15 0042  Weight: 48.762 kg (107 lb 8 oz)    Exam:   General:  thin and frail elderly, NAD  Cardiovascular: RRR  Respiratory: CTABL  Abdomen: Soft/ND/NT, positive  BS  Musculoskeletal: No Edema  Neuro: aaox3, no focal findings.  Data Reviewed: Basic Metabolic Panel:  Recent Labs Lab 02/21/15 2157 02/21/15 2218 02/22/15 0520 02/23/15 0412 02/24/15 0313  NA 140 138 138 142 139  K 3.4* 3.5 3.5 3.9 3.7  CL 101 102 107 111 106  CO2  --  GLUCOSE 102* 102* 124* 96 86  BUN 26* 22* CREATININE 1.80* 1.83* 1.46* 1.10* 0.99  CALCIUM  --  8.7* 8.0* 8.4* 8.4*   Liver Function Tests:  Recent Labs Lab 02/21/15 2218  AST 16  ALT 11*  ALKPHOS 62  BILITOT 0.2*  PROT 5.6*  ALBUMIN 2.8*   No results for input(s): LIPASE, AMYLASE in the last 168 hours. No results for input(s): AMMONIA in the last 168 hours. CBC:  Recent Labs Lab 02/21/15 2157 02/21/15 2218 02/22/15 0520  WBC  --  10.7* 9.2  HGB 13.3 12.2 10.6*  HCT 39.0 37.4 33.1*  MCV  --  87.6 88.3  PLT  --  410* 375   Cardiac Enzymes:   No results for input(s): CKTOTAL, CKMB, CKMBINDEX, TROPONINI in the last 168 hours. BNP (last 3 results)  Recent Labs  09/18/14 1149 10/03/14 1140  BNP 51.6 24.5    ProBNP (last 3 results) No results for input(s): PROBNP in the last 8760 hours.  CBG: No results for input(s): GLUCAP in the last 168 hours.  No results found for this or any previous visit (from the past 240 hour(s)).   Studies:  No results found.  Scheduled Meds: . allopurinol  450 mg Oral Daily  . aspirin EC  325 mg Oral QODAY  . atenolol  50 mg Oral QHS  . atorvastatin  10 mg Oral QHS  . azithromycin  500 mg Oral Q24H  . budesonide  0.5 mg Nebulization BID  . cefTRIAXone (ROCEPHIN)  IV  1 g Intravenous Q24H  . enoxaparin (LOVENOX) injection  30 mg Subcutaneous Q24H  . famotidine  20 mg Oral Daily  . feeding supplement (ENSURE ENLIVE)  237 mL Oral BID BM  . guaiFENesin  600 mg Oral BID  . ipratropium-albuterol  3 mL Nebulization 3 times per day  . multivitamin with minerals  1 tablet Oral Daily  . senna-docusate  2 tablet Oral BID     Continuous Infusions:     Time spent: 25mins  Kermit Arnette MD, PhD  [facility] Pager [phone]. If 7PM-7AM, please contact night-coverage at [email or web address], password [email or web address] 02/24/2015, 3:17 PM  LOS: 2 days

## 2015-02-24 NOTE — Progress Notes (Signed)
PHARMACIST - PHYSICIAN COMMUNICATION DR:   Roda ShuttersXu CONCERNING: Antibiotic IV to Oral Route Change Policy  RECOMMENDATION: This patient is receiving Azithromycin by the intravenous route.  Based on criteria approved by the Pharmacy and Therapeutics Committee, the antibiotic(s) is/are being converted to the equivalent oral dose form(s).   DESCRIPTION: These criteria include:  Patient being treated for a respiratory tract infection, urinary tract infection, cellulitis or clostridium difficile associated diarrhea if on metronidazole  The patient is not neutropenic and does not exhibit a GI malabsorption state  The patient is eating (either orally or via tube) and/or has been taking other orally administered medications for a least 24 hours  The patient is improving clinically and has a Tmax < 100.5   If you have questions about this conversion, please contact the Pharmacy Department  []   [phone]( [phone] )  [facility] []   [phone]( [phone] )  [facility] [x]   [phone]( [phone] )  [facility] []   [phone]( [phone] )  [facility] []   [phone]( [phone] )  [facility]   Georgina PillionElizabeth Almus Woodham, PharmD, BCPS Clinical Pharmacist Pager: [phone] 02/24/2015 8:56 AM

## 2015-02-25 LAB — BASIC METABOLIC PANEL
Anion gap: 9 (ref 5–15)
BUN: 11 mg/dL (ref 6–20)
CO2: 29 mmol/L (ref 22–32)
Calcium: 8.9 mg/dL (ref 8.9–10.3)
Chloride: 101 mmol/L (ref 101–111)
Creatinine, Ser: 0.91 mg/dL (ref 0.44–1.00)
GFR calc Af Amer: >60 mL/min (ref >60)
GFR, EST NON AFRICAN AMERICAN: 54 mL/min — AB (ref >60)
GLUCOSE: 101 mg/dL — AB (ref 65–99)
Potassium: 3.5 mmol/L (ref 3.5–5.1)
Sodium: 139 mmol/L (ref 135–145)

## 2015-02-25 LAB — MAGNESIUM: MAGNESIUM: 1.6 mg/dL — AB (ref 1.7–2.4)

## 2015-02-25 MED ORDER — DOXYCYCLINE HYCLATE 100 MG PO TABS: 100.0000 mg | ORAL_TABLET | Freq: Two times a day (BID) | ORAL | Status: DC

## 2015-02-25 MED ORDER — GUAIFENESIN ER 600 MG PO TB12: 600.0000 mg | ORAL_TABLET | Freq: Two times a day (BID) | ORAL | Status: DC

## 2015-02-25 MED ORDER — ENSURE ENLIVE PO LIQD: 237.0000 mL | Freq: Two times a day (BID) | ORAL | Status: DC

## 2015-02-25 MED ORDER — MAGNESIUM SULFATE 2 GM/50ML IV SOLN
2.0000 g | Freq: Once | INTRAVENOUS | Status: AC
  Administered 2015-02-25: 2 g via INTRAVENOUS
  Filled 2015-02-25: qty 50

## 2015-02-25 MED ORDER — RESOURCE THICKENUP CLEAR PO POWD: ORAL | Status: DC

## 2015-02-25 MED ORDER — RESOURCE THICKENUP CLEAR PO POWD
ORAL | Status: DC | PRN
  Administered 2015-02-25: 11:00:00 via ORAL
  Filled 2015-02-25: qty 125

## 2015-02-25 NOTE — Progress Notes (Signed)
Discharge instructions gone over with patient. Home medications gone over. Prescriptions to be picked up at patient's drugstore. Follow up appointment to be made. [facility]. Home health social worker, registered nurse, and physical therapist will be provided.  Rolling walker discharged with patient. Diet, activity, and reasons to call the doctor discussed. Reasons to call 911 discussed. Patient verbalized understanding of instructions.

## 2015-02-25 NOTE — Progress Notes (Signed)
CM spoke to Dr. Roda ShuttersXu via phone who states [facility] and also needs a rolling walker. CM will plan to speak to pt also to assess living situation. Per MD, pt lives with family.

## 2015-02-25 NOTE — Evaluation (Signed)
Clinical/Bedside Swallow Evaluation Patient Details  Name: [name] MRN: [id number] Date of Birth: December 12, 1922  Today's Date: 02/25/2015 Time: SLP Start Time (ACUTE ONLY): 0930 SLP Stop Time (ACUTE ONLY): 0950 SLP Time Calculation (min) (ACUTE ONLY): 20 min  Past Medical History:  Past Medical History  Diagnosis Date  . Hypertension   . Arthritis   . Gout   . H/O hiatal hernia   . GERD (gastroesophageal reflux disease)   . Bronchitis    Past Surgical History:  Past Surgical History  Procedure Laterality Date  . Appendectomy    . Tee without cardioversion N/A 10/13/2013    Procedure: TRANSESOPHAGEAL ECHOCARDIOGRAM (TEE);  Surgeon: Quintella Reichert, MD;  Location[facility] ENDOSCOPY;  Service: Cardiovascular;  Laterality: N/A;   HPI:  Pt admitted with community acquired pneumonia,on rocephin/zitrho/mucinex/nebs, improving. Patient reported remote history of dysphagia, but denies trouble swallowing currently. Pt admitted for similiar problem in 2015, MBS showed silent microaspriation events due to primary cervical esophageal dysphagia. Pt recommended to consume Dys 3/nectar thick liquids with multiple swallows and an intermittent throat clear. Pt continued recommended diet during SNF admission, but has been taking thin liquids at home.   Assessment / Plan / Recommendation Clinical Impression  Pt does not demonstrate immediate signs of aspiration, however, given dx of pna and history of silent microaspiration events due to cervical esophageal dysphagia with recommendation for diet modification, probability of ongoing dysphagia and aspiration high. Utilized teaching and demosntration to encourage throat clearing with sips, which consistently elicted a second swallow. Pt was able to return demonstrate with min verbal cues during session though expect compliance to be poor.   Will modify diet to dys 3/nectar thick liquids with consideration for repeat objective testing based on pt and family  wishes. Likely this pt will be found to have mild chronic dysphagia with persistent risk of aspiration. Prolonged use of thickened liquids may not be desired by pt or reliably reduce risk of aspiration. Risk of postprandial aspiration given possible esophageal dysphagia also a possibility.     Aspiration Risk  Moderate    Diet Recommendation Dysphagia 3 (Mech soft);Nectar   Medication Administration: Whole meds with puree Compensations: Clear throat intermittently    Other  Recommendations Oral Care Recommendations: Oral care BID Other Recommendations: Order thickener from pharmacy   Follow Up Recommendations       Frequency and Duration min 2x/week  2 weeks   Pertinent Vitals/Pain NA    SLP Swallow Goals     Swallow Study Prior Functional Status       General Other Pertinent Information: Pt admitted with community acquired pneumonia,on rocephin/zitrho/mucinex/nebs, improving. Patient reported remote history of dysphagia, but denies trouble swallowing currently. Pt admitted for similiar problem in 2015, MBS showed silent microaspriation events due to primary cervical esophageal dysphagia. Pt recommended to consume Dys 3/nectar thick liquids with multiple swallows and an intermittent throat clear. Type of Study: Bedside swallow evaluation Previous Swallow Assessment: see HPI Diet Prior to this Study: Regular;Thin liquids Temperature Spikes Noted: No Respiratory Status: Room air History of Recent Intubation: No Behavior/Cognition: Alert;Cooperative;Pleasant mood Oral Cavity - Dentition: Missing dentition Self-Feeding Abilities: Able to feed self Patient Positioning: Upright in bed Baseline Vocal Quality: Normal Volitional Cough: Congested Volitional Swallow: Able to elicit    Oral/Motor/Sensory Function Overall Oral Motor/Sensory Function: Appears within functional limits for tasks assessed   Ice Chips Ice chips: Not tested   Thin Liquid Thin Liquid: Within functional  limits    Nectar Thick Nectar  Thick Liquid: Not tested   Honey Thick Honey Thick Liquid: Not tested   Puree Puree: Within functional limits   Solid   GO    Solid: Impaired Presentation: Self Fed Oral Phase Functional Implications: Other (comment) (delayed oral transit)       Rashi Giuliani, Riley NearingBonnie Caroline 02/25/2015,9:53 AM

## 2015-02-25 NOTE — Discharge Summary (Addendum)
Discharge Summary  [name] ZOX:[id number] DOB: 08/20/1922  PCP: Pearson Grippe, MD  Admit date: 02/21/2015 Discharge date: 02/25/2015  Time spent: >53mins  Recommendations for Outpatient Follow-up:  1. F/u with PMD in two week, pmd to repeat cxr 2view to ensure resolution of pna. pmd to monitor blood pressure control. 2. Diet modified to soft diet, nectar thick liquid to minimize risk of aspiration. 3. Discharged home with home health/RN/PT/social worker, patient is considering ALF or SNF placement.  Discharge Diagnoses:  Active Hospital Problems   Diagnosis Date Noted  . Dehydration 02/21/2015  . Severe malnutrition 02/23/2015  . AKI (acute kidney injury) 02/22/2015  . Arthritis 02/22/2015  . Weakness   . COLD (chronic obstructive lung disease)   . HTN (hypertension) 12/02/2012    Resolved Hospital Problems   Diagnosis Date Noted Date Resolved  No resolved problems to display.    Discharge Condition: stable  Diet recommendation: heart healthy, soft diet and nectar thick liquid  Filed Weights   02/22/15 0042  Weight: 48.762 kg (107 lb 8 oz)    History of present illness:  [name] is a 81 y.o. female with a history of HTN, Arthritis who presents to the ED because she reports has felt bad , has had weakness and poor intake of foods and liquids. She denies any fevers ore chills or nausea or vomiting or diarrhea. She was evaluated in the ED and was found to have an increase in her BUN/Cr and was referred for admission.    Hospital Course:  Principal Problem:   Dehydration Active Problems:   HTN (hypertension)   COLD (chronic obstructive lung disease)   AKI (acute kidney injury)   Arthritis   Weakness   Severe malnutrition  Community acquired pneumonia +/- aspiration pneumonia:  Was treated with rocephin/zitrho/mucinex/nebs, improved. Change to oral doxycycline to finish course . Continue home nebulizer. No wheezing at time of discharge. Patient  reported remote history of dysphagia, diet modified to soft diet and nectar thick liquid per swallow eval. Pmd to repeat cxr two view to ensure resolution of pna.  ARF, likely from dehydration, ua unremarkable, cr normalized with ivf. Cozaar d/ced.  HTN:  bp low normal on admission. D/c cozaar, norvasc, continue atenolol. bp has improved at discharge, discharged home with atenolol and norvasc, continue hold cozaar, pmd to monitor bp control,  Further titrate bp meds.  Hiatal hernia/GERD: received pepcid in the hospital  Gout: stable on allopurinol.  H/o HLD: lipid panel wnl, consider decrease lipitor dose per pmd's discretion.  Severe malnutrition: appreciate nutrition consult, on nutrition supplement.   FTT/weakness: PT/OT, home health, patient is interested in ALF/short term rehab, home health social worker to work with patient for further arrangement.  Hypomagnesemia: mag replaced.   Code Status: full, verified with patient  Family Communication: patient   Disposition Plan: home with home health.   Consultants:  none  Procedures:  none  Antibiotics: Rocephin/zithromax from 7/7 to 7/9 Doxycycline from 7/9   Discharge Exam: BP 142/59 mmHg  Pulse 68  Temp(Src) 98 F (36.7 C) (Oral)  Resp 19  Ht 5\' 5"  (1.651 m)  Wt 48.762 kg (107 lb 8 oz)  BMI 17.89 kg/m2  SpO2 98%  General: aaox3 Cardiovascular: RRR Respiratory: no wheezing,  crackles at right bases , cleared with deep breath.  Discharge Instructions You were cared for by a hospitalist during your hospital stay. If you have any questions about your discharge medications or the care you received while you  were in the hospital after you are discharged, you can call the unit and asked to speak with the hospitalist on call if the hospitalist that took care of you is not available. Once you are discharged, your primary care physician will handle any further medical issues. Please note that NO REFILLS for any  discharge medications will be authorized once you are discharged, as it is imperative that you return to your primary care physician (or establish a relationship with a primary care physician if you do not have one) for your aftercare needs so that they can reassess your need for medications and monitor your lab values.      Discharge Instructions    Diet - low sodium heart healthy    Complete by:  As directed      Increase activity slowly    Complete by:  As directed             Medication List    STOP taking these medications        levofloxacin 500 MG tablet  Commonly known as:  LEVAQUIN     losartan 100 MG tablet  Commonly known as:  COZAAR     predniSONE 10 MG tablet  Commonly known as:  DELTASONE      TAKE these medications        albuterol 108 (90 BASE) MCG/ACT inhaler  Commonly known as:  PROVENTIL HFA;VENTOLIN HFA  Inhale 2 puffs into the lungs every 4 (four) hours as needed for wheezing or shortness of breath.     allopurinol 300 MG tablet  Commonly known as:  ZYLOPRIM  Take 450 mg by mouth daily.     amLODipine 10 MG tablet  Commonly known as:  NORVASC  Take 10 mg by mouth daily.     aspirin EC 325 MG tablet  Take 325 mg by mouth every other day.     atenolol 25 MG tablet  Commonly known as:  TENORMIN  Take 50 mg by mouth at bedtime.     atorvastatin 20 MG tablet  Commonly known as:  LIPITOR  Take 20 mg by mouth at bedtime.     budesonide 0.5 MG/2ML nebulizer solution  Commonly known as:  PULMICORT  Take 2 mLs (0.5 mg total) by nebulization 2 (two) times daily.     doxycycline 100 MG tablet  Commonly known as:  VIBRA-TABS  Take 1 tablet (100 mg total) by mouth every 12 (twelve) hours.     feeding supplement (ENSURE ENLIVE) Liqd  Take 237 mLs by mouth 2 (two) times daily between meals.     guaiFENesin 600 MG 12 hr tablet  Commonly known as:  MUCINEX  Take 1 tablet (600 mg total) by mouth 2 (two) times daily.     ipratropium-albuterol  0.5-2.5 (3) MG/3ML Soln  Commonly known as:  DUONEB  Take 3 mLs by nebulization every 6 (six) hours as needed.     RESOURCE THICKENUP CLEAR Powd  On nectar thick liquid to prevent aspiration.       Allergies  Allergen Reactions  . Penicillins Rash    Tolerated ceftriaxone   Follow-up Information    Follow up with Pearson Grippe, MD In 2 weeks.   Specialty:  Internal Medicine   Why:  repeat cxr in three weeks to ensure resolution of pna.   Contact information:   [address] [place] [address] [phone]        The results of significant diagnostics from this  hospitalization (including imaging, microbiology, ancillary and laboratory) are listed below for reference.    Significant Diagnostic Studies: Dg Chest 2 View  02/22/2015   CLINICAL DATA:  Cough.  Bronchitis.  EXAM: CHEST  2 VIEW  COMPARISON:  11/28/2014  FINDINGS: New airspace disease is seen in the posterior left lower lobe, suspicious for pneumonia. No evidence of pleural effusion.  Pulmonary emphysema again demonstrated. Right lung remains clear. Heart size is within normal limits.  IMPRESSION: New posterior right lower lobe airspace disease, suspicious for pneumonia. Followup PA and lateral chest X-ray is recommended in 3-4 weeks following trial of antibiotic therapy to ensure resolution and exclude underlying malignancy.  Emphysema.   Electronically Signed   By: Myles RosenthalJohn  Stahl M.D.   On: 02/22/2015 13:48    Microbiology: No results found for this or any previous visit (from the past 240 hour(s)).   Labs: Basic Metabolic Panel:  Recent Labs Lab 02/21/15 2218 02/22/15 0520 02/23/15 0412 02/24/15 0313 02/25/15 0413  NA 138 138 142 139 139  K 3.5 3.5 3.9 3.7 3.5  CL 102 107 111 106 101  CO2 26 24 25 25 29   GLUCOSE 102* 124* 96 86 101*  BUN 22* 18 16 16 11   CREATININE 1.83* 1.46* 1.10* 0.99 0.91  CALCIUM 8.7* 8.0* 8.4* 8.4* 8.9  MG  --   --   --   --  1.6*   Liver Function Tests:  Recent  Labs Lab 02/21/15 2218  AST 16  ALT 11*  ALKPHOS 62  BILITOT 0.2*  PROT 5.6*  ALBUMIN 2.8*   No results for input(s): LIPASE, AMYLASE in the last 168 hours. No results for input(s): AMMONIA in the last 168 hours. CBC:  Recent Labs Lab 02/21/15 2157 02/21/15 2218 02/22/15 0520  WBC  --  10.7* 9.2  HGB 13.3 12.2 10.6*  HCT 39.0 37.4 33.1*  MCV  --  87.6 88.3  PLT  --  410* 375   Cardiac Enzymes: No results for input(s): CKTOTAL, CKMB, CKMBINDEX, TROPONINI in the last 168 hours. BNP: BNP (last 3 results)  Recent Labs  09/18/14 1149 10/03/14 1140  BNP 51.6 24.5    ProBNP (last 3 results) No results for input(s): PROBNP in the last 8760 hours.  CBG: No results for input(s): GLUCAP in the last 168 hours.     SignedAlbertine Grates:  Greig Altergott MD, PhD  [facility] 02/25/2015, 11:29 AM

## 2015-02-25 NOTE — Care Management Note (Addendum)
Case Management Note  Patient Details  Name: [name] MRN: [id number] Date of Birth: 09/13/1922  Subjective/Objective:                  Chief Complaint: Weakness  Action/Plan: Cm spoke to patient at the bedside who states that she is going home with her brother, his significant other, and her nephew (who is also picking her up). CM spoke to patient and [facility] and pt states that she would like [facility] be [facility] by [facility]. CM called Tiffany with [facility] and informed that pt needs RN, PT, and SW. Information accepted. PT states that she has a BSC at home and has shower handles. Pt states that she has no further DME needs or discharge questions. Rolling Walker at the bedside [facility] [place] [facility]. No further CM needs communicated.   Expected Discharge Date:  02/25/15             Expected Discharge Plan:  Home w Home Health Services  In-House Referral:     Discharge planning Services  CM Consult  Post Acute Care Choice:  Home Health Choice offered to:  Patient  DME Arranged:  Walker rolling DME Agency:  [facility]  HH Arranged:  PT, RN (SW also arranged) HH Agency:  [facility]  Status of Service:  Completed, signed off  Medicare Important Message Given:    Date Medicare IM Given:   Medicare IM give by:    Date Additional Medicare IM Given:  02/25/15 Additional Medicare Important Message give by:  Roxanne GatesAnna Elenna Spratling, RN, BSN, CM  If discussed at [employer] Meetings, dates discussed:    Additional Comments:  Darcel SmallingAnna C Treyana Sturgell, RN 02/25/2015, 1:45 PM

## 2015-04-26 ENCOUNTER — Ambulatory Visit (INDEPENDENT_AMBULATORY_CARE_PROVIDER_SITE_OTHER)
Admission: RE | Admit: 2015-04-26 | Discharge: 2015-04-26 | Disposition: A | Discharge status: Home / Self Care | Payer: Commercial Managed Care - HMO | Source: Ambulatory Visit | Attending: Internal Medicine | Admitting: Internal Medicine

## 2015-04-26 ENCOUNTER — Ambulatory Visit (INDEPENDENT_AMBULATORY_CARE_PROVIDER_SITE_OTHER): Payer: Commercial Managed Care - HMO | Admitting: Internal Medicine

## 2015-04-26 ENCOUNTER — Encounter: Payer: Self-pay | Admitting: Internal Medicine

## 2015-04-26 ENCOUNTER — Other Ambulatory Visit (INDEPENDENT_AMBULATORY_CARE_PROVIDER_SITE_OTHER): Payer: Commercial Managed Care - HMO

## 2015-04-26 VITALS — BP 106/70 | HR 71 | Ht 61.0 in | Wt 107.0 lb

## 2015-04-26 DIAGNOSIS — R0600 Dyspnea, unspecified: Secondary | ICD-10-CM

## 2015-04-26 DIAGNOSIS — J449 Chronic obstructive pulmonary disease, unspecified: Secondary | ICD-10-CM | POA: Diagnosis not present

## 2015-04-26 DIAGNOSIS — J8410 Pulmonary fibrosis, unspecified: Secondary | ICD-10-CM

## 2015-04-26 DIAGNOSIS — R0602 Shortness of breath: Secondary | ICD-10-CM | POA: Diagnosis not present

## 2015-04-26 DIAGNOSIS — J8489 Other specified interstitial pulmonary diseases: Secondary | ICD-10-CM | POA: Diagnosis not present

## 2015-04-26 LAB — CBC WITH DIFFERENTIAL/PLATELET
Basophils Absolute: 0 10*3/uL (ref 0.0–0.1)
Basophils Relative: 0.5 % (ref 0.0–3.0)
Eosinophils Absolute: 0.6 10*3/uL (ref 0.0–0.7)
Eosinophils Relative: 8.7 % — ABNORMAL HIGH (ref 0.0–5.0)
HEMATOCRIT: 38.8 % (ref 36.0–46.0)
Hemoglobin: 12.7 g/dL (ref 12.0–15.0)
LYMPHS PCT: 30.9 % (ref 12.0–46.0)
Lymphs Abs: 2 10*3/uL (ref 0.7–4.0)
MCHC: 32.6 g/dL (ref 30.0–36.0)
MCV: 89.7 fl (ref 78.0–100.0)
MONOS PCT: 7.4 % (ref 3.0–12.0)
Monocytes Absolute: 0.5 10*3/uL (ref 0.1–1.0)
NEUTROS ABS: 3.4 10*3/uL (ref 1.4–7.7)
Neutrophils Relative %: 52.5 % (ref 43.0–77.0)
Platelets: 335 10*3/uL (ref 150.0–400.0)
RBC: 4.32 Mil/uL (ref 3.87–5.11)
RDW: 16.9 % — ABNORMAL HIGH (ref 11.5–15.5)
WBC: 6.5 10*3/uL (ref 4.0–10.5)

## 2015-04-26 LAB — BASIC METABOLIC PANEL
BUN: 19 mg/dL (ref 6–23)
CHLORIDE: 104 meq/L (ref 96–112)
CO2: 28 mEq/L (ref 19–32)
Calcium: 10.1 mg/dL (ref 8.4–10.5)
Creatinine, Ser: 1.01 mg/dL (ref 0.40–1.20)
GFR: 66.17 mL/min (ref >60.00)
Glucose, Bld: 95 mg/dL (ref 70–99)
POTASSIUM: 4.3 meq/L (ref 3.5–5.1)
Sodium: 140 mEq/L (ref 135–145)

## 2015-04-26 LAB — TSH: TSH: 1.35 u[IU]/mL (ref 0.35–4.50)

## 2015-04-26 LAB — BRAIN NATRIURETIC PEPTIDE: PRO B NATRI PEPTIDE: 37 pg/mL (ref 0.0–100.0)

## 2015-04-26 LAB — SEDIMENTATION RATE: Sed Rate: 13 mm/hr (ref 0–22)

## 2015-04-26 MED ORDER — PANTOPRAZOLE SODIUM 40 MG PO TBEC: 40.0000 mg | DELAYED_RELEASE_TABLET | Freq: Every day | ORAL | Status: DC

## 2015-04-26 MED ORDER — FAMOTIDINE 20 MG PO TABS: ORAL_TABLET | ORAL | Status: DC

## 2015-04-26 NOTE — Progress Notes (Signed)
Subjective:    Patient ID: [name], female    DOB: June 02, 1923,   MRN: [id number]  HPI  85 yobf quit smoking 2011 p pna then better until fall of 2015 and downhill since despite placed on nebs which help only transiently help having to use q 3 h so referred to pulmonary clinic 04/26/2015 by Dr Pearson Grippe    04/26/2015 1st [facility] office visit/ Wert   Chief Complaint  Patient presents with  . Pulmonary Consult    Referred by Dr Pearson Grippe. Pt c/o SOB "for a long time"- occurs "when it's time to use the breathing machine, about every 3 hours".  She states that she wakes up in the night feeling SOB "after I have a bad dream".   doe x > slow adls - MMRC2 = can't walk a nl pace on a flat grade s sob Onset of symptoms insidious/ min progressive/ with dry cough esp at hs / better on duoneb/pulmocort     No obvious day to day or daytime variability or assoc excess or purulent sputum or cp or chest tightness, subjective wheeze or overt sinus or hb symptoms. No unusual exp hx or h/o childhood pna/ asthma or knowledge of premature birth.  Sleeping ok without nocturnal  or early am exacerbation  of respiratory  c/o's or need for noct saba. Also denies any obvious fluctuation of symptoms with weather or environmental changes or other aggravating or alleviating factors except as outlined above   Current Medications, Allergies, Complete Past Medical History, Past Surgical History, Family History, and Social History were reviewed in [employer] record.           Review of Systems  Constitutional: Negative for fever, chills and unexpected weight change.  HENT: Negative for congestion, dental problem, ear pain, nosebleeds, postnasal drip, rhinorrhea, sinus pressure, sneezing, sore throat, trouble swallowing and voice change.   Eyes: Negative for visual disturbance.  Respiratory: Positive for cough and shortness of breath. Negative for choking.   Cardiovascular:  Positive for leg swelling. Negative for chest pain.  Gastrointestinal: Negative for vomiting, abdominal pain and diarrhea.  Genitourinary: Negative for difficulty urinating.  Musculoskeletal: Negative for arthralgias.  Skin: Negative for rash.  Neurological: Negative for tremors, syncope and headaches.  Hematological: Does not bruise/bleed easily.       Objective:   Physical Exam    amb bf very shaky on details of care  Wt Readings from Last 3 Encounters:  04/26/15 107 lb (48.535 kg)  02/22/15 107 lb 8 oz (48.762 kg)  11/28/14 114 lb (51.71 kg)    Vital signs reviewed  HEENT: poor  dentition, turbinates, and orophanx. Nl external ear canals without cough reflex   NECK :  without JVD/Nodes/TM/ nl carotid upstrokes bilaterally   LUNGS: no acc muscle use, BV breath sounds bilaterally  R > L with min rhonchi/ congested cough    CV:  RRR  no s3 -  II-III/VI sem  no increase in P2,  1+ bilateral lower ext edema   ABD:  soft and nontender with nl excursion in the supine position. No bruits or organomegaly, bowel sounds nl  MS:  warm without deformities, calf tenderness, cyanosis or clubbing  SKIN: warm and dry without lesions    NEURO:  alert, approp, no deficits    I personally reviewed images and agree with radiology impression as follows:  CXR: 04/26/2015 Interval improvement in the left lower lobe infiltrate or atelectasis. Persistent coarse right  infrahilar lung markings superimposed upon COPD and chronic pulmonary fibrosis       Labs ordered/ reviewed:    Lab 04/26/15 1419  NA 140  K 4.3  CL 104  CO2 28  BUN 19  CREATININE 1.01  GLUCOSE 95    Lab 04/26/15 1419  HGB 12.7  HCT 38.8  WBC 6.5  PLT 335.0     Lab Results  Component Value Date   TSH 1.35 04/26/2015     Lab Results  Component Value Date   PROBNP 37.0 04/26/2015     Lab Results  Component Value Date   ESRSEDRATE 13 04/26/2015            Assessment & Plan:

## 2015-04-26 NOTE — Patient Instructions (Signed)
Please remember to go to the lab and x-ray department downstairs for your tests - we will call you with the results when they are available.    Pantoprazole (protonix) 40 mg   Take  30-60 min before first meal of the day and Pepcid (famotidine)  20 mg one @  bedtime until return to office   GERD (REFLUX)  is an extremely common cause of respiratory symptoms just like yours , many times with no obvious heartburn at all.    It can be treated with medication, but also with lifestyle changes including elevation of the head of your bed (ideally with 6 inch  bed blocks),  Smoking cessation, avoidance of late meals, excessive alcohol, and avoid fatty foods, chocolate, peppermint, colas, red wine, and acidic juices such as orange juice.  NO MINT OR MENTHOL PRODUCTS SO NO COUGH DROPS  USE SUGARLESS CANDY INSTEAD (Jolley ranchers or Stover's or Life Savers) or even ice chips will also do - the key is to swallow to prevent all throat clearing. NO OIL BASED VITAMINS - use powdered substitutes.    Please schedule a follow up office visit in 4 weeks(or next available) , sooner if needed for pfts and bring all active medications with you

## 2015-04-27 NOTE — Progress Notes (Signed)
Quick Note:  Spoke with pt and notified of results per Dr. Wert. Pt verbalized understanding and denied any questions.  ______ 

## 2015-04-29 ENCOUNTER — Encounter: Payer: Self-pay | Admitting: Internal Medicine

## 2015-04-29 DIAGNOSIS — J8410 Pulmonary fibrosis, unspecified: Secondary | ICD-10-CM | POA: Insufficient documentation

## 2015-04-29 NOTE — Assessment & Plan Note (Signed)
04/26/2015  Walked RA x 2 laps @ 185 ft each stopped due to fatigue/ slow pace/ no desats   Symptoms are markedly disproportionate to objective findings and not clear this is a lung problem but pt does appear to have difficult airway management issues. DDX of  difficult airways management all start with A and  include Adherence, Ace Inhibitors, Acid Reflux, Active Sinus Disease, Alpha 1 Antitripsin deficiency, Anxiety masquerading as Airways dz,  ABPA,  allergy(esp in young), Aspiration (esp in elderly), Adverse effects of meds,  Active smokers, A bunch of PE's (a small clot burden can't cause this syndrome unless there is already severe underlying pulm or vascular dz with poor reserve) plus two Bs  = Bronchiectasis and Beta blocker use..and one C= CHF  Adherence is always the initial "prime suspect" and is a multilayered concern that requires a "trust but verify" approach in every patient - starting with knowing how to use medications, especially inhalers, correctly, keeping up with refills and understanding the fundamental difference between maintenance and prns vs those medications only taken for a very short course and then stopped and not refilled.   ? Acid (or non-acid) GERD > always difficult to exclude as up to 75% of pts in some series report no assoc GI/ Heartburn symptoms> rec max (24h)  acid suppression and diet restrictions/ reviewed and instructions given in writing.   ? chf > excluded by bnp << 100    Needs pfts to complete w/u.  Total time = 60m review case with pt/ discussion/ counseling/ giving and going over instructions (see avs)

## 2015-04-29 NOTE — Assessment & Plan Note (Signed)
In absence of walking desats it's hard to believe this is contributing to her problems. Even if it is, Use of PPI is associated with improved survival time and with decreased radiologic fibrosis per King's study published in [facility] vol 184 p1390.  Dec 2011 and also may have other beneficial effects as per the latest review in [place] vol 193 p1345 Jun 20016.  This may not always be cause and effect, but given how universally unimpressive and expensive  all the other  Drugs developed to day  have been for pf,   rec start  rx ppi / diet/ lifestyle modification and f/u with serial walking sats and lung volumes for now to put more points on the curve / establish firm baseline before considering additional measures.

## 2015-04-29 NOTE — Assessment & Plan Note (Signed)
  When respiratory symptoms begin or become refractory well after a patient reports complete smoking cessation,  Especially when this wasn't the case while they were smoking, a red flag is raised based on the work of Dr Primitivo Gauze which states:  if you quit smoking when your best day FEV1 is still well preserved it is highly unlikely you will progress to severe disease.  That is to say, once the smoking stops,  the symptoms should not suddenly erupt or markedly worsen.  If so, the differential diagnosis should include  obesity/deconditioning,  LPR/Reflux/Aspiration syndromes,  occult CHF, or  especially side effect of medications commonly used in this population.    Will get her back for pfts before make any recs for change

## 2015-05-16 DIAGNOSIS — E559 Vitamin D deficiency, unspecified: Secondary | ICD-10-CM | POA: Diagnosis not present

## 2015-05-21 DIAGNOSIS — I10 Essential (primary) hypertension: Secondary | ICD-10-CM | POA: Diagnosis not present

## 2015-05-21 DIAGNOSIS — M109 Gout, unspecified: Secondary | ICD-10-CM | POA: Diagnosis not present

## 2015-05-21 DIAGNOSIS — Z23 Encounter for immunization: Secondary | ICD-10-CM | POA: Diagnosis not present

## 2015-05-21 DIAGNOSIS — E559 Vitamin D deficiency, unspecified: Secondary | ICD-10-CM | POA: Diagnosis not present

## 2015-05-21 DIAGNOSIS — J449 Chronic obstructive pulmonary disease, unspecified: Secondary | ICD-10-CM | POA: Diagnosis not present

## 2015-05-21 DIAGNOSIS — E7800 Pure hypercholesterolemia, unspecified: Secondary | ICD-10-CM | POA: Diagnosis not present

## 2015-05-28 ENCOUNTER — Ambulatory Visit (INDEPENDENT_AMBULATORY_CARE_PROVIDER_SITE_OTHER): Payer: Commercial Managed Care - HMO | Admitting: Internal Medicine

## 2015-05-28 ENCOUNTER — Encounter: Payer: Self-pay | Admitting: Internal Medicine

## 2015-05-28 DIAGNOSIS — J449 Chronic obstructive pulmonary disease, unspecified: Secondary | ICD-10-CM

## 2015-05-28 LAB — PULMONARY FUNCTION TEST
DL/VA % pred: 105 %
DL/VA: 4.81 ml/min/mmHg/L
DLCO UNC % PRED: 56 %
DLCO unc: 12.21 ml/min/mmHg
FEF 25-75 PRE: 0.58 L/s
FEF 25-75 Post: 1 L/sec
FEF2575-%Change-Post: 72 %
FEF2575-%PRED-POST: 128 %
FEF2575-%Pred-Pre: 74 %
FEV1-%Change-Post: 16 %
FEV1-%Pred-Post: 116 %
FEV1-%Pred-Pre: 100 %
FEV1-POST: 1.26 L
FEV1-Pre: 1.08 L
FEV1FVC-%Change-Post: 3 %
FEV1FVC-%Pred-Pre: 88 %
FEV6-%CHANGE-POST: 12 %
FEV6-%PRED-POST: 143 %
FEV6-%Pred-Pre: 126 %
FEV6-Post: 1.88 L
FEV6-Pre: 1.66 L
FEV6FVC-%CHANGE-POST: 0 %
FEV6FVC-%PRED-POST: 106 %
FEV6FVC-%Pred-Pre: 105 %
FVC-%Change-Post: 12 %
FVC-%PRED-POST: 135 %
FVC-%Pred-Pre: 120 %
FVC-Post: 1.88 L
FVC-Pre: 1.68 L
POST FEV1/FVC RATIO: 67 %
POST FEV6/FVC RATIO: 100 %
PRE FEV6/FVC RATIO: 99 %
Pre FEV1/FVC ratio: 64 %
RV % PRED: 95 %
RV: 2.38 L
TLC % PRED: 87 %
TLC: 4.16 L

## 2015-05-28 MED ORDER — PREDNISONE 10 MG PO TABS: ORAL_TABLET | ORAL | Status: DC

## 2015-05-28 NOTE — Progress Notes (Signed)
Subjective:    Patient ID: [name], female    DOB: 10-Dec-1922,   MRN: [id number]    Brief patient profile:  89 yobf quit smoking 2011 p pna then better until fall of 2015 and downhill since despite placed on nebs which help only transiently help having to use q 3 h so referred to pulmonary clinic 04/26/2015 by Dr Pearson Grippe and proved to have GOLD I criteria 05/28/2015     History of Present Illness  04/26/2015 1st [facility] office visit/ [name]   Chief Complaint  Patient presents with  . Pulmonary Consult    Referred by Dr Pearson Grippe. Pt c/o SOB "for a long time"- occurs "when it's time to use the breathing machine, about every 3 hours".  She states that she wakes up in the night feeling SOB "after I have a bad dream".   doe x > slow adls - MMRC2 = can't walk a nl pace on a flat grade s sob Onset of symptoms insidious/ min progressive/ with dry cough esp at hs / better on duoneb/pulmocort  rec Please remember to go to the lab and x-ray department downstairs for your tests - we will call you with the results when they are available. Pantoprazole (protonix) 40 mg   Take  30-60 min before first meal of the day and Pepcid (famotidine)  20 mg one @  bedtime until return to office  GERD  Diet  Please schedule a follow up office visit in 4 weeks(or next available) , sooner if needed for pfts and bring all active medications with you    05/28/2015  f/u ov/[name] re: GOLD I COPD  Chief Complaint  Patient presents with  . Follow-up    PFT done. Pt states that her breathing is unchanged.      Very confused with details of care. Did not bring any medicines with her as requested     No obvious day to day or daytime variability or assoc  cp or chest tightness, subjective wheeze or overt sinus or hb symptoms. No unusual exp hx or h/o childhood pna/ asthma or knowledge of premature birth.  Sleeping ok without nocturnal  or early am exacerbation  of respiratory  c/o's or need for noct  saba. Also denies any obvious fluctuation of symptoms with weather or environmental changes or other aggravating or alleviating factors except as outlined above   Current Medications, Allergies, Complete Past Medical History, Past Surgical History, Family History, and Social History were reviewed in [employer] record.  ROS  The following are not active complaints unless bolded sore throat, dysphagia, dental problems, itching, sneezing,  nasal congestion or excess/ purulent secretions, ear ache,   fever, chills, sweats, unintended wt loss, classically pleuritic or exertional cp, hemoptysis,  orthopnea pnd or leg swelling, presyncope, palpitations, abdominal pain, anorexia, nausea, vomiting, diarrhea  or change in bowel or bladder habits, change in stools or urine, dysuria,hematuria,  rash, arthralgias, visual complaints, headache, numbness, weakness or ataxia or problems with walking or coordination,  change in mood/affect or memory.             Objective:   Physical Exam    amb bf extremely  shaky on details of care   05/28/2015     105  Wt Readings from Last 3 Encounters:  04/26/15 107 lb (48.535 kg)  02/22/15 107 lb 8 oz (48.762 kg)  11/28/14 114 lb (51.71 kg)    Vital signs reviewed  HEENT:  poor  dentition, turbinates, and orophanx. Nl external ear canals without cough reflex   NECK :  without JVD/Nodes/TM/ nl carotid upstrokes bilaterally   LUNGS: no acc muscle use,  Min insp and exp rhonchi bilaterally    CV:  RRR  no s3 -  II/VI sem  no increase in P2, No  ext edema   ABD:  soft and nontender with nl excursion in the supine position. No bruits or organomegaly, bowel sounds nl  MS:  warm without deformities, calf tenderness, cyanosis or clubbing  SKIN: warm and dry without lesions    NEURO:  alert, approp, no deficits    I personally reviewed images and agree with radiology impression as follows:  CXR: 04/26/2015 Interval improvement in the  left lower lobe infiltrate or atelectasis. Persistent coarse right infrahilar lung markings superimposed upon COPD and chronic pulmonary fibrosis       Labs  reviewed:    Lab 04/26/15 1419  NA 140  K 4.3  CL 104  CO2 28  BUN 19  CREATININE 1.01  GLUCOSE 95    Lab 04/26/15 1419  HGB 12.7  HCT 38.8  WBC 6.5  PLT 335.0     Lab Results  Component Value Date   TSH 1.35 04/26/2015     Lab Results  Component Value Date   PROBNP 37.0 04/26/2015     Lab Results  Component Value Date   ESRSEDRATE 13 04/26/2015            Assessment & Plan:

## 2015-05-28 NOTE — Patient Instructions (Addendum)
Your nebulizer machine should have duoneb(albterol and ipatropium) which you use 4 x daily and pulmicort (budesonide) that you take twice daily   Prednisone 10 mg take  4 each am x 2 days,   2 each am x 2 days,  1 each am x 2 days and stop   Pantoprazole (protonix) 40 mg   Take  30-60 min before first meal of the day and Pepcid (famotidine)  20 mg one @  bedtime until return to office   See Tammy NP w/in 2 weeks with all your medications, even over the counter meds, separated in two separate bags, the ones you take no matter what vs the ones you stop once you feel better and take only as needed when you feel you need them.   Tammy  will generate for you a new user friendly medication calendar that will put [place] all on the same page re: your medication use.     Without this process, it simply isn't possible to assure that we are providing  your outpatient care  with  the attention to detail we feel you deserve.   If we cannot assure that you're getting that kind of care,  then we cannot manage your problem effectively from this clinic.  Once you have seen Tammy and we are sure that we're all on the same page with your medication use she will arrange follow up with me.  Late add Consider change to performist/bud next if her insurance will pay as she is less likely to forget twice-daily treatment than 4 times daily treatment

## 2015-05-28 NOTE — Progress Notes (Signed)
PFT done today. 

## 2015-05-29 NOTE — Assessment & Plan Note (Signed)
PFT's  05/28/2015  FEV1 1.26 (116 % ) ratio 67  p 16 % improvement from saba with DLCO  56 % corrects to 105 % for alv volume    So really she   has minimal COPD but does have enough reversibility to suggest an asthmatic component and should do ok on  DuoNeb plus budesonide would be a good combination if she can stay on it. The problem I have with her is one of accurate and meaningful medication reconciliation. My principal is that it is critical to be sure the patient is doing what we ask her to do before we ask  her to do more   To keep things simple, I have asked the patient to first separate medicines that are perceived as maintenance, that is to be taken daily "no matter what", from those medicines that are taken on only on an as-needed basis and I have given the patient examples of both, and then return to see our NP to generate a  detailed  medication calendar which should be followed until the next physician sees the patient and updates it.    I had an extended discussion with the patient reviewing all relevant studies completed to date and  lasting 15 to 20 minutes of a 25 minute visit    Each maintenance medication was reviewed in detail including most importantly the difference between maintenance and prns and under what circumstances the prns are to be triggered using an action plan format that is not reflected in the computer generated alphabetically organized AVS.    Please see instructions for details which were reviewed in writing and the patient given a copy highlighting the part that I personally wrote and discussed at today's ov.

## 2015-06-11 ENCOUNTER — Ambulatory Visit (INDEPENDENT_AMBULATORY_CARE_PROVIDER_SITE_OTHER): Payer: Commercial Managed Care - HMO | Admitting: Adult Health

## 2015-06-11 ENCOUNTER — Encounter: Payer: Self-pay | Admitting: Adult Health

## 2015-06-11 VITALS — BP 112/68 | HR 61 | Ht 62.0 in | Wt 103.8 lb

## 2015-06-11 DIAGNOSIS — J449 Chronic obstructive pulmonary disease, unspecified: Secondary | ICD-10-CM | POA: Diagnosis not present

## 2015-06-11 MED ORDER — BUDESONIDE 0.25 MG/2ML IN SUSP: 0.2500 mg | Freq: Two times a day (BID) | RESPIRATORY_TRACT | Status: DC

## 2015-06-11 NOTE — Progress Notes (Signed)
Subjective:    Patient ID: [name], female    DOB: 01/20/1923,   MRN: [id number]    Brief patient profile:  89 yobf quit smoking 2011 p pna then better until fall of 2015 and downhill since despite placed on nebs which help only transiently help having to use q 3 h so referred to pulmonary clinic 04/26/2015 by Dr Pearson GrippeJames Kim and proved to have GOLD I criteria 05/28/2015     History of Present Illness  04/26/2015 1st [place] [facility] office visit/ Wert   Chief Complaint  Patient presents with  . Pulmonary Consult    Referred by Dr Pearson GrippeJames Kim. Pt c/o SOB "for a long time"- occurs "when it's time to use the breathing machine, about every 3 hours".  She states that she wakes up in the night feeling SOB "after I have a bad dream".   doe x > slow adls - MMRC2 = can't walk a nl pace on a flat grade s sob Onset of symptoms insidious/ min progressive/ with dry cough esp at hs / better on duoneb/pulmocort  rec Please remember to go to the lab and x-ray department downstairs for your tests - we will call you with the results when they are available. Pantoprazole (protonix) 40 mg   Take  30-60 min before first meal of the day and Pepcid (famotidine)  20 mg one @  bedtime until return to office  GERD  Diet  Please schedule a follow up office visit in 4 weeks(or next available) , sooner if needed for pfts and bring all active medications with you    05/28/2015  f/u ov/Wert re: GOLD I COPD  Chief Complaint  Patient presents with  . Follow-up    PFT done. Pt states that her breathing is unchanged.      Very confused with details of care. Did not bring any medicines with her as requested   >pred taper   06/11/2015 Follow up : GOLD 1 COPD  Pt returns for 2 weeks follow up . We reviewed all her meds and organized them into a med calendar with pt education . We discussed her directions from last ov with using budesonide neb Twice daily  Along with duoneb Four times a day  . Only to use albuterol  As needed  . Does not appear budesonide was started.  Does feel her breathing is doing better  Denies chest pain, orthopnea. Or edema .    Current Medications, Allergies, Complete Past Medical History, Past Surgical History, Family History, and Social History were reviewed in [facility] electronic medical record.  ROS  The following are not active complaints unless bolded sore throat, dysphagia, dental problems, itching, sneezing,  nasal congestion or excess/ purulent secretions, ear ache,   fever, chills, sweats, unintended wt loss, classically pleuritic or exertional cp, hemoptysis,  orthopnea pnd or leg swelling, presyncope, palpitations, abdominal pain, anorexia, nausea, vomiting, diarrhea  or change in bowel or bladder habits, change in stools or urine, dysuria,hematuria,  rash, arthralgias, visual complaints, headache, numbness, weakness or ataxia or problems with walking or coordination,  change in mood/affect or memory.             Objective:   Physical Exam    amb bf nad   05/28/2015     105  >103 06/11/2015   Vital signs reviewed  HEENT: poor  dentition, turbinates, and orophanx. Nl external ear canals without cough reflex   NECK :  without JVD/Nodes/TM/ nl carotid  upstrokes bilaterally   LUNGS: no acc muscle use, decreased BS in bases     CV:  RRR  no s3 -  II/VI sem  no increase in P2, No  ext edema   ABD:  soft and nontender with nl excursion in the supine position. No bruits or organomegaly, bowel sounds nl  MS:  warm without deformities, calf tenderness, cyanosis or clubbing  SKIN: warm and dry without lesions    NEURO:  alert, approp, no deficits     CXR: 04/26/2015 Interval improvement in the left lower lobe infiltrate or atelectasis. Persistent coarse right infrahilar lung markings superimposed upon COPD and chronic pulmonary fibrosis       Labs  reviewed:    Lab 04/26/15 1419  NA 140  K 4.3  CL 104  CO2 28  BUN 19  CREATININE 1.01    GLUCOSE 95    Lab 04/26/15 1419  HGB 12.7  HCT 38.8  WBC 6.5  PLT 335.0     Lab Results  Component Value Date   TSH 1.35 04/26/2015     Lab Results  Component Value Date   PROBNP 37.0 04/26/2015     Lab Results  Component Value Date   ESRSEDRATE 13 04/26/2015            Assessment & Plan:

## 2015-06-11 NOTE — Patient Instructions (Signed)
Begin Budesonide Neb Twice daily  .  Use Duoneb every 6hr as needed.  Follow med calendar closely and bring to each visit.  Follow up Dr. Sherene SiresWert  In 3-4 months and As needed

## 2015-06-17 NOTE — Assessment & Plan Note (Signed)
Patient's medications were reviewed today and patient education was given. Computerized medication calendar was adjusted/completed  Plan   Begin Budesonide Neb Twice daily  .  Use Duoneb every 6hr as needed.  Follow med calendar closely and bring to each visit.  Follow up Dr. Sherene SiresWert  In 3-4 months and As needed

## 2015-08-15 DIAGNOSIS — M109 Gout, unspecified: Secondary | ICD-10-CM | POA: Diagnosis not present

## 2015-08-15 DIAGNOSIS — E559 Vitamin D deficiency, unspecified: Secondary | ICD-10-CM | POA: Diagnosis not present

## 2015-08-15 DIAGNOSIS — I10 Essential (primary) hypertension: Secondary | ICD-10-CM | POA: Diagnosis not present

## 2015-09-11 ENCOUNTER — Ambulatory Visit: Payer: Commercial Managed Care - HMO | Admitting: Internal Medicine

## 2015-09-26 DIAGNOSIS — I10 Essential (primary) hypertension: Secondary | ICD-10-CM | POA: Diagnosis not present

## 2015-09-26 DIAGNOSIS — E7800 Pure hypercholesterolemia, unspecified: Secondary | ICD-10-CM | POA: Diagnosis not present

## 2015-09-26 DIAGNOSIS — J449 Chronic obstructive pulmonary disease, unspecified: Secondary | ICD-10-CM | POA: Diagnosis not present

## 2015-09-26 DIAGNOSIS — E559 Vitamin D deficiency, unspecified: Secondary | ICD-10-CM | POA: Diagnosis not present

## 2015-09-26 DIAGNOSIS — Z1389 Encounter for screening for other disorder: Secondary | ICD-10-CM | POA: Diagnosis not present

## 2015-10-12 ENCOUNTER — Ambulatory Visit (INDEPENDENT_AMBULATORY_CARE_PROVIDER_SITE_OTHER): Payer: Commercial Managed Care - HMO | Admitting: Internal Medicine

## 2015-10-12 ENCOUNTER — Encounter: Payer: Self-pay | Admitting: Internal Medicine

## 2015-10-12 VITALS — BP 90/60 | HR 66 | Ht 65.0 in | Wt 106.0 lb

## 2015-10-12 DIAGNOSIS — J449 Chronic obstructive pulmonary disease, unspecified: Secondary | ICD-10-CM | POA: Diagnosis not present

## 2015-10-12 MED ORDER — BUDESONIDE 0.25 MG/2ML IN SUSP: 0.2500 mg | Freq: Two times a day (BID) | RESPIRATORY_TRACT | Status: DC

## 2015-10-12 NOTE — Progress Notes (Signed)
Subjective:    Patient ID: [name], female    DOB: [birth year],   MRN: [id number]    Brief patient profile:  89 yobf quit smoking 2011 p pna then better until fall of 2015 and downhill since despite placed on nebs which help only transiently help having to use q 3 h so referred to pulmonary clinic 04/26/2015 by Dr Pearson Grippe and proved to have GOLD I criteria 05/28/2015     History of Present Illness  04/26/2015 1st [place] [facility] office visit/ [name]   Chief Complaint  Patient presents with  . Pulmonary Consult    Referred by Dr Pearson Grippe. Pt c/o SOB "for a long time"- occurs "when it's time to use the breathing machine, about every 3 hours".  She states that she wakes up in the night feeling SOB "after I have a bad dream".   doe x > slow adls - MMRC2 = can't walk a nl pace on a flat grade s sob Onset of symptoms insidious/ min progressive/ with dry cough esp at hs / better on duoneb/pulmocort  rec Please remember to go to the lab and x-ray department downstairs for your tests - we will call you with the results when they are available. Pantoprazole (protonix) 40 mg   Take  30-60 min before first meal of the day and Pepcid (famotidine)  20 mg one @  bedtime until return to office  GERD  Diet  Please schedule a follow up office visit in 4 weeks(or next available) , sooner if needed for pfts and bring all active medications with you    05/28/2015  f/u ov/[name] re: GOLD I COPD  Chief Complaint  Patient presents with  . Follow-up    PFT done. Pt states that her breathing is unchanged.      Very confused with details of care. Did not bring any medicines with her as requested   >pred taper   06/11/2015 Follow up : GOLD 1 COPD  Pt returns for 2 weeks follow up . We reviewed all her meds and organized them into a med calendar with pt education . We discussed her directions from last ov with using budesonide neb Twice daily  Along with duoneb Four times a day  . Only to use albuterol  As needed  . Does not appear budesonide was started.  Does feel her breathing is doing better  Denies chest pain, orthopnea. Or edema  rec Begin Budesonide Neb Twice daily  .  Use Duoneb every 6hr as needed.  Follow med calendar closely and bring to each visit.    10/12/2015  f/u ov/[name] re:  GOLD I copd/ geriatric decline Chief Complaint  Patient presents with  . Follow-up    Pt states breathing seems worse at times "if I do too much".     comes to office with "helper" but neither pt nor her "helper" have any insight at all into how she takes her meds and has no duoneb or budesonide in her bags, still has ventolin but not sure how to use it  No obvious day to day or daytime variability or assoc excess/ purulent sputum or mucus plugs or hemoptysis or cp or chest tightness, subjective wheeze or overt sinus or hb symptoms. No unusual exp hx or h/o childhood pna/ asthma or knowledge of premature birth.  Sleeping ok without nocturnal  or early am exacerbation  of respiratory  c/o's or need for noct saba. Also denies any obvious fluctuation of  symptoms with weather or environmental changes or other aggravating or alleviating factors except as outlined above   Current Medications, Allergies, Complete Past Medical History, Past Surgical History, Family History, and Social History were reviewed in [employer] [facility] record.  ROS  The following are not active complaints unless bolded sore throat, dysphagia, dental problems, itching, sneezing,  nasal congestion or excess/ purulent secretions, ear ache,   fever, chills, sweats, unintended wt loss, classically pleuritic or exertional cp,  orthopnea pnd or leg swelling, presyncope, palpitations, abdominal pain, anorexia, nausea, vomiting, diarrhea  or change in bowel or bladder habits, change in stools or urine, dysuria,hematuria,  rash, arthralgias, visual complaints, headache, numbness, weakness or ataxia or problems with walking or  coordination,  change in mood/affect or memory.          Objective:   Physical Exam    amb bf nad   05/28/2015     105  >103 06/11/2015 > 10/12/2015  106   Vital signs reviewed  HEENT: poor  dentition, turbinates, and orophanx. Nl external ear canals without cough reflex   NECK :  without JVD/Nodes/TM/ nl carotid upstrokes bilaterally   LUNGS: no acc muscle use, insp and exp rhonchi bilaterally    CV:  RRR  no s3 -  II/VI sem  no increase in P2, No  ext edema   ABD:  soft and nontender with nl excursion in the supine position. No bruits or organomegaly, bowel sounds nl  MS:  warm without deformities, calf tenderness, cyanosis or clubbing  SKIN: warm and dry without lesions    NEURO:  alert, approp, no deficits     CXR: 04/26/2015 Interval improvement in the left lower lobe infiltrate or atelectasis. Persistent coarse right infrahilar lung markings superimposed upon COPD and chronic pulmonary fibrosis       Labs  reviewed:    Lab 04/26/15 1419  NA 140  K 4.3  CL 104  CO2 28  BUN 19  CREATININE 1.01  GLUCOSE 95    Lab 04/26/15 1419  HGB 12.7  HCT 38.8  WBC 6.5  PLT 335.0     Lab Results  Component Value Date   TSH 1.35 04/26/2015     Lab Results  Component Value Date   PROBNP 37.0 04/26/2015     Lab Results  Component Value Date   ESRSEDRATE 13 04/26/2015            Assessment & Plan:   Outpatient Encounter Prescriptions as of 10/12/2015  Medication Sig  . albuterol (PROVENTIL HFA;VENTOLIN HFA) 108 (90 BASE) MCG/ACT inhaler Inhale 2 puffs into the lungs every 4 (four) hours as needed for wheezing or shortness of breath. (Patient taking differently: Inhale 2 puffs into the lungs every 4 (four) hours as needed for shortness of breath (wheezing and shortness of breath ((PLANB))). )  . allopurinol (ZYLOPRIM) 300 MG tablet Take 300 mg by mouth every morning.  Marland Kitchen amLODipine (NORVASC) 10 MG tablet Take 10 mg by mouth every morning.   Marland Kitchen  atenolol (TENORMIN) 25 MG tablet Take 25 mg by mouth 2 (two) times daily.   Marland Kitchen atorvastatin (LIPITOR) 20 MG tablet Take 20 mg by mouth at bedtime.  Marland Kitchen ibuprofen (ADVIL,MOTRIN) 200 MG tablet Take 200 mg by mouth every 6 (six) hours as needed.  Marland Kitchen ipratropium-albuterol (DUONEB) 0.5-2.5 (3) MG/3ML SOLN Take 3 mLs by nebulization every 6 (six) hours as needed. (Patient taking differently: Take 3 mLs by nebulization every 4 (four) hours as needed. )  .  losartan (COZAAR) 100 MG tablet Take 50 mg by mouth every morning.   . pantoprazole (PROTONIX) 40 MG tablet Take 1 tablet (40 mg total) by mouth daily. Take 30-60 min before first meal of the day (Patient taking differently: Take 40 mg by mouth daily before breakfast. )  . budesonide (PULMICORT) 0.25 MG/2ML nebulizer solution Take 2 mLs (0.25 mg total) by nebulization 2 (two) times daily.  . [DISCONTINUED] aspirin EC 325 MG tablet Take 325 mg by mouth every morning.   . [DISCONTINUED] budesonide (PULMICORT) 0.25 MG/2ML nebulizer solution Take 2 mLs (0.25 mg total) by nebulization 2 (two) times daily.  . [DISCONTINUED] famotidine (PEPCID) 20 MG tablet One at bedtime (Patient taking differently: Take 20 mg by mouth at bedtime. )  . [DISCONTINUED] ipratropium-albuterol (DUONEB) 0.5-2.5 (3) MG/3ML SOLN Use 1 neb every 6 hours as needed for wheezing and shortness of breath ((PLAN C))  . [DISCONTINUED] Maltodextrin-Xanthan Gum (RESOURCE THICKENUP CLEAR) POWD On nectar thick liquid to prevent aspiration. (Patient not taking: Reported on 06/11/2015)  . [DISCONTINUED] Multiple Vitamin (MULTIVITAMIN) tablet Take 1 tablet by mouth once a week.   No facility-administered encounter medications on file as of 10/12/2015.

## 2015-10-12 NOTE — Patient Instructions (Signed)
Combination = Budesonide is 0.25 mg with duoneb in am and 12 hours later  (bfast and suppertime)    in between  The combination ok to use just the duoneb say at lunch and bedtime    If you are satisfied with your treatment plan,  let your doctor know and he/she can either refill your medications or you can return here when your prescription runs out.     If in any way you are not 100% satisfied,  please tell us.  If 100% better, tell your friends!  Pulmonary follow up is as needed

## 2015-10-12 NOTE — Assessment & Plan Note (Addendum)
PFT's  05/28/2015  FEV1 1.26 (116 % ) ratio 67  p 16 % improvement from saba with DLCO  56 % corrects to 105 % for alv volume  - The proper method of use, as well as anticipated side effects, of a metered-dose inhaler are discussed and demonstrated to the patient. Improved effectiveness after extensive coaching during this visit to a level of approximately 75 % from a baseline of 50 %   I had an extended final summary discussion with the patient reviewing all relevant studies completed to date and  lasting 15 to 20 minutes of a 25 minute visit on the following issues:    1) she only has mild copd with AB component that should be easy to control if she understands and follows instructions  2) rec is budesonide 0.25 mg bid and duoneb qid  3) Each maintenance medication was reviewed in detail including most importantly the difference between maintenance and as needed and under what circumstances the prns are to be used.  Please see instructions for details which were reviewed in writing and the patient given a copy.    4) pulmonary f/u is prn - if not doing well needs to return to clinic with all meds in hand

## 2015-10-19 ENCOUNTER — Other Ambulatory Visit: Payer: Self-pay | Admitting: Internal Medicine

## 2015-12-28 DIAGNOSIS — J449 Chronic obstructive pulmonary disease, unspecified: Secondary | ICD-10-CM | POA: Diagnosis not present

## 2015-12-28 DIAGNOSIS — E7800 Pure hypercholesterolemia, unspecified: Secondary | ICD-10-CM | POA: Diagnosis not present

## 2015-12-28 DIAGNOSIS — I10 Essential (primary) hypertension: Secondary | ICD-10-CM | POA: Diagnosis not present

## 2015-12-28 DIAGNOSIS — Z79899 Other long term (current) drug therapy: Secondary | ICD-10-CM | POA: Diagnosis not present

## 2016-01-05 ENCOUNTER — Emergency Department (HOSPITAL_COMMUNITY)
Admission: EM | Admit: 2016-01-05 | Discharge: 2016-01-06 | Disposition: A | Discharge status: Home / Self Care | Payer: Commercial Managed Care - HMO | Attending: Emergency Medicine | Admitting: Emergency Medicine

## 2016-01-05 ENCOUNTER — Encounter (HOSPITAL_COMMUNITY): Payer: Self-pay | Admitting: Emergency Medicine

## 2016-01-05 DIAGNOSIS — M109 Gout, unspecified: Secondary | ICD-10-CM | POA: Diagnosis not present

## 2016-01-05 DIAGNOSIS — Z87891 Personal history of nicotine dependence: Secondary | ICD-10-CM | POA: Insufficient documentation

## 2016-01-05 DIAGNOSIS — R42 Dizziness and giddiness: Secondary | ICD-10-CM | POA: Diagnosis not present

## 2016-01-05 DIAGNOSIS — R51 Headache: Secondary | ICD-10-CM | POA: Diagnosis present

## 2016-01-05 DIAGNOSIS — M1990 Unspecified osteoarthritis, unspecified site: Secondary | ICD-10-CM | POA: Diagnosis not present

## 2016-01-05 DIAGNOSIS — Z880 Allergy status to penicillin: Secondary | ICD-10-CM | POA: Diagnosis not present

## 2016-01-05 DIAGNOSIS — M79662 Pain in left lower leg: Secondary | ICD-10-CM | POA: Diagnosis not present

## 2016-01-05 DIAGNOSIS — M79606 Pain in leg, unspecified: Secondary | ICD-10-CM | POA: Diagnosis not present

## 2016-01-05 DIAGNOSIS — Z79899 Other long term (current) drug therapy: Secondary | ICD-10-CM | POA: Diagnosis not present

## 2016-01-05 DIAGNOSIS — I10 Essential (primary) hypertension: Secondary | ICD-10-CM | POA: Diagnosis not present

## 2016-01-05 DIAGNOSIS — K219 Gastro-esophageal reflux disease without esophagitis: Secondary | ICD-10-CM | POA: Insufficient documentation

## 2016-01-05 DIAGNOSIS — Z8709 Personal history of other diseases of the respiratory system: Secondary | ICD-10-CM | POA: Diagnosis not present

## 2016-01-05 MED ORDER — MECLIZINE HCL 25 MG PO TABS
12.5000 mg | ORAL_TABLET | Freq: Once | ORAL | Status: AC
  Administered 2016-01-05: 12.5 mg via ORAL
  Filled 2016-01-05: qty 1

## 2016-01-05 MED ORDER — MECLIZINE HCL 25 MG PO TABS: 12.5000 mg | ORAL_TABLET | Freq: Three times a day (TID) | ORAL | Status: DC | PRN

## 2016-01-05 NOTE — ED Notes (Signed)
Pt in EMS from home. States that she was cooking dinner, suddenly had HA/dizziness, bilateral leg pain/weakness. Pt A/O, was ambulatory to stretcher. VSS

## 2016-01-05 NOTE — ED Provider Notes (Signed)
CSN: [id number]     Arrival date & time 01/05/16  2118 History   First MD Initiated Contact with Patient 01/05/16 2216     Chief Complaint  Patient presents with  . Headache  . Leg Pain     (Consider location/radiation/quality/duration/timing/severity/associated sxs/prior Treatment) HPI Patient reports that she was cooking dinner she suddenly became very "swimmy headed". She states it seemed like things were going "end over end". No associated visual changes. No associated focal weakness numbness or tingling. Patient poor she's had some problems with numbness on the front of her left lower leg and some pain in that leg for a long time. There was no new change in terms of any numbness tingling or leg symptoms. He states that she has taken some Aleve for the leg and she was wondering if that might have caused her to have that dizziness. She reports she's had some headache off and on today. Otherwise achiness. Past Medical History  Diagnosis Date  . Hypertension   . Arthritis   . Gout   . H/O hiatal hernia   . GERD (gastroesophageal reflux disease)   . Bronchitis    Past Surgical History  Procedure Laterality Date  . Appendectomy    . Tee without cardioversion N/A 10/13/2013    Procedure: TRANSESOPHAGEAL ECHOCARDIOGRAM (TEE);  Surgeon: Quintella Reichert, MD;  Location: [facility] ENDOSCOPY;  Service: Cardiovascular;  Laterality: N/A;   Family History  Problem Relation Age of Onset  . Cancer Mother   . Stroke Father   . Cirrhosis Brother    Social History  Substance Use Topics  . Smoking status: Former Smoker -- 0.50 packs/day for 70 years    Quit date: 08/18/2008  . Smokeless tobacco: Former Neurosurgeon    Quit date: 10/13/2004  . Alcohol Use: No   OB History    No data available     Review of Systems 10 Systems reviewed and are negative for acute change except as noted in the HPI.    Allergies  Penicillins  Home Medications   Prior to Admission medications   Medication Sig Start  Date End Date Taking? Authorizing Provider  albuterol (PROVENTIL HFA;VENTOLIN HFA) 108 (90 BASE) MCG/ACT inhaler Inhale 2 puffs into the lungs every 4 (four) hours as needed for wheezing or shortness of breath. Patient taking differently: Inhale 2 puffs into the lungs every 4 (four) hours as needed for shortness of breath (wheezing and shortness of breath ((PLANB))).  11/28/14  Yes Azalia Bilis, MD  allopurinol (ZYLOPRIM) 300 MG tablet Take 300 mg by mouth every morning.   Yes Historical Provider, MD  amLODipine (NORVASC) 10 MG tablet Take 10 mg by mouth every evening.  06/07/14  Yes Historical Provider, MD  atenolol (TENORMIN) 25 MG tablet Take 25 mg by mouth 2 (two) times daily.    Yes Historical Provider, MD  atorvastatin (LIPITOR) 20 MG tablet Take 20 mg by mouth at bedtime.   Yes Historical Provider, MD  budesonide (PULMICORT) 0.25 MG/2ML nebulizer solution Take 2 mLs (0.25 mg total) by nebulization 2 (two) times daily. Patient taking differently: Take 0.25 mg by nebulization 3 (three) times daily.  10/12/15  Yes Nyoka Cowden, MD  chlorpheniramine-HYDROcodone (TUSSIONEX) 10-8 MG/5ML SUER Take 5 mLs by mouth daily as needed for cough.  12/24/15  Yes Historical Provider, MD  famotidine (PEPCID) 20 MG tablet Take 20 mg by mouth at bedtime. 10/19/15  Yes Historical Provider, MD  ibuprofen (ADVIL,MOTRIN) 200 MG tablet Take 200 mg by mouth  every 6 (six) hours as needed for moderate pain.    Yes Historical Provider, MD  ipratropium-albuterol (DUONEB) 0.5-2.5 (3) MG/3ML SOLN Take 3 mLs by nebulization every 6 (six) hours as needed. Patient taking differently: Take 3 mLs by nebulization every 4 (four) hours as needed.  11/28/14  Yes Azalia BilisKevin Campos, MD  losartan (COZAAR) 100 MG tablet Take 50 mg by mouth every morning.    Yes Historical Provider, MD  oxyCODONE-acetaminophen (PERCOCET/ROXICET) 5-325 MG tablet Take 1 tablet by mouth daily as needed. 12/24/15  Yes Historical Provider, MD  meclizine (ANTIVERT) 25 MG  tablet Take 0.5 tablets (12.5 mg total) by mouth 3 (three) times daily as needed for dizziness. 01/05/16   Arby BarretteMarcy Nekeya Briski, MD  pantoprazole (PROTONIX) 40 MG tablet TAKE 1 TABLET BY MOUTH EVERY DAY 30-60 MINUTES BEFORE FIRST MEAL OF THE DAY 10/22/15   Nyoka CowdenMichael B Wert, MD   BP 130/80 mmHg  Pulse 63  Temp(Src) 98.3 F (36.8 C) (Oral)  Resp 20  Ht 5\' 5"  (1.651 m)  Wt 103 lb (46.72 kg)  BMI 17.14 kg/m2  SpO2 96% Physical Exam  Constitutional: She is oriented to person, place, and time. She appears well-developed and well-nourished.  HENT:  Head: Normocephalic and atraumatic.  Nose: Nose normal.  Mouth/Throat: Oropharynx is clear and moist.  TMs normal without erythema or bulging.  Eyes: EOM are normal. Pupils are equal, round, and reactive to light.  Neck: Neck supple.  Cardiovascular: Normal rate, regular rhythm, normal heart sounds and intact distal pulses.   Pulmonary/Chest: Effort normal and breath sounds normal.  Abdominal: Soft. Bowel sounds are normal. She exhibits no distension. There is no tenderness.  Musculoskeletal: Normal range of motion. She exhibits no edema.  Neurological: She is alert and oriented to person, place, and time. She has normal strength. No cranial nerve deficit. She exhibits normal muscle tone. Coordination normal. GCS eye subscore is 4. GCS verbal subscore is 5. GCS motor subscore is 6.  Skin: Skin is warm, dry and intact.  Psychiatric: She has a normal mood and affect.    ED Course  Procedures (including critical care time) Labs Review Labs Reviewed - No data to display  Imaging Review No results found. I have personally reviewed and evaluated these images and lab results as part of my medical decision-making.   EKG Interpretation   Date/Time:  Saturday Jan 05 2016 21:31:21 EDT Ventricular Rate:  63 PR Interval:  185 QRS Duration: 84 QT Interval:  409 QTC Calculation: 419 R Axis:   15 Text Interpretation:  Sinus rhythm Probable left atrial  enlargement  Anteroseptal infarct, age indeterminate No significant change since last  tracing c Confirmed by Erroll Lunani, Adeleke Ayokunle [phone]([id number]) on 01/06/2016 5:30:30  PM      MDM   Final diagnoses:  Vertigo   Patient is alert and well. She describes an episode that was very consistent with vertigo. Triage note indicates bilateral leg pain and weakness however this is not as the patient described to me. She is advised she had some pain in her left leg that sounds muscular skeletal which is been long-standing. She felt that by taking Aleve and she may have triggered the dizziness. There were no associated neurologic symptoms that would suggest a CVA. Symptoms were self-limited and the patient has an excellent physical examination. The patient be prescribed Antivert and is counseled on signs and symptoms were to return.    Arby BarretteMarcy Essance Gatti, MD 01/09/16 [phone]

## 2016-01-05 NOTE — Discharge Instructions (Signed)
Benign Positional Vertigo Vertigo is the feeling that you or your surroundings are moving when they are not. Benign positional vertigo is the most common form of vertigo. The cause of this condition is not serious (is benign). This condition is triggered by certain movements and positions (is positional). This condition can be dangerous if it occurs while you are doing something that could endanger you or others, such as driving.  CAUSES In many cases, the cause of this condition is not known. It may be caused by a disturbance in an area of the inner ear that helps your brain to sense movement and balance. This disturbance can be caused by a viral infection (labyrinthitis), head injury, or repetitive motion. RISK FACTORS This condition is more likely to develop in:  Women.  People who are 50 years of age or older. SYMPTOMS Symptoms of this condition usually happen when you move your head or your eyes in different directions. Symptoms may start suddenly, and they usually last for less than a minute. Symptoms may include:  Loss of balance and falling.  Feeling like you are spinning or moving.  Feeling like your surroundings are spinning or moving.  Nausea and vomiting.  Blurred vision.  Dizziness.  Involuntary eye movement (nystagmus). Symptoms can be mild and cause only slight annoyance, or they can be severe and interfere with daily life. Episodes of benign positional vertigo may return (recur) over time, and they may be triggered by certain movements. Symptoms may improve over time. DIAGNOSIS This condition is usually diagnosed by medical history and a physical exam of the head, neck, and ears. You may be referred to a health care provider who specializes in ear, nose, and throat (ENT) problems (otolaryngologist) or a provider who specializes in disorders of the nervous system (neurologist). You may have additional testing, including:  MRI.  A CT scan.  Eye movement tests. Your  health care provider may ask you to change positions quickly while he or she watches you for symptoms of benign positional vertigo, such as nystagmus. Eye movement may be tested with an electronystagmogram (ENG), caloric stimulation, the Dix-Hallpike test, or the roll test.  An electroencephalogram (EEG). This records electrical activity in your brain.  Hearing tests. TREATMENT Usually, your health care provider will treat this by moving your head in specific positions to adjust your inner ear back to normal. Surgery may be needed in severe cases, but this is rare. In some cases, benign positional vertigo may resolve on its own in 2-4 weeks. HOME CARE INSTRUCTIONS Safety  Move slowly.Avoid sudden body or head movements.  Avoid driving.  Avoid operating heavy machinery.  Avoid doing any tasks that would be dangerous to you or others if a vertigo episode would occur.  If you have trouble walking or keeping your balance, try using a cane for stability. If you feel dizzy or unstable, sit down right away.  Return to your normal activities as told by your health care provider. Ask your health care provider what activities are safe for you. General Instructions  Take over-the-counter and prescription medicines only as told by your health care provider.  Avoid certain positions or movements as told by your health care provider.  Drink enough fluid to keep your urine clear or pale yellow.  Keep all follow-up visits as told by your health care provider. This is important. SEEK MEDICAL CARE IF:  You have a fever.  Your condition gets worse or you develop new symptoms.  Your family or friends   notice any behavioral changes.  Your nausea or vomiting gets worse.  You have numbness or a "pins and needles" sensation. SEEK IMMEDIATE MEDICAL CARE IF:  You have difficulty speaking or moving.  You are always dizzy.  You faint.  You develop severe headaches.  You have weakness in your  legs or arms.  You have changes in your hearing or vision.  You develop a stiff neck.  You develop sensitivity to light.   This information is not intended to replace advice given to you by your health care provider. Make sure you discuss any questions you have with your health care provider.   Document Released: 05/12/2006 Document Revised: 04/25/2015 Document Reviewed: 11/27/2014 Elsevier Interactive Patient Education 2016 Elsevier Inc.  

## 2016-01-05 NOTE — ED Notes (Signed)
[facility] called  

## 2016-01-06 DIAGNOSIS — R42 Dizziness and giddiness: Secondary | ICD-10-CM | POA: Diagnosis not present

## 2016-02-14 ENCOUNTER — Emergency Department (HOSPITAL_COMMUNITY): Payer: Commercial Managed Care - HMO

## 2016-02-14 ENCOUNTER — Emergency Department (HOSPITAL_COMMUNITY)
Admission: EM | Admit: 2016-02-14 | Discharge: 2016-02-14 | Disposition: A | Discharge status: Home / Self Care | Payer: Commercial Managed Care - HMO | Attending: Emergency Medicine | Admitting: Emergency Medicine

## 2016-02-14 ENCOUNTER — Encounter (HOSPITAL_COMMUNITY): Payer: Self-pay | Admitting: *Deleted

## 2016-02-14 DIAGNOSIS — K8070 Calculus of gallbladder and bile duct without cholecystitis without obstruction: Secondary | ICD-10-CM | POA: Diagnosis not present

## 2016-02-14 DIAGNOSIS — J449 Chronic obstructive pulmonary disease, unspecified: Secondary | ICD-10-CM | POA: Diagnosis not present

## 2016-02-14 DIAGNOSIS — I10 Essential (primary) hypertension: Secondary | ICD-10-CM | POA: Diagnosis not present

## 2016-02-14 DIAGNOSIS — M1990 Unspecified osteoarthritis, unspecified site: Secondary | ICD-10-CM | POA: Insufficient documentation

## 2016-02-14 DIAGNOSIS — Z79899 Other long term (current) drug therapy: Secondary | ICD-10-CM | POA: Insufficient documentation

## 2016-02-14 DIAGNOSIS — Z87891 Personal history of nicotine dependence: Secondary | ICD-10-CM | POA: Insufficient documentation

## 2016-02-14 DIAGNOSIS — K5900 Constipation, unspecified: Secondary | ICD-10-CM | POA: Diagnosis not present

## 2016-02-14 DIAGNOSIS — K8020 Calculus of gallbladder without cholecystitis without obstruction: Secondary | ICD-10-CM | POA: Diagnosis not present

## 2016-02-14 DIAGNOSIS — R030 Elevated blood-pressure reading, without diagnosis of hypertension: Secondary | ICD-10-CM | POA: Diagnosis not present

## 2016-02-14 DIAGNOSIS — R109 Unspecified abdominal pain: Secondary | ICD-10-CM | POA: Diagnosis not present

## 2016-02-14 HISTORY — DX: Chronic obstructive pulmonary disease, unspecified: J44.9

## 2016-02-14 LAB — URINALYSIS, ROUTINE W REFLEX MICROSCOPIC
Bilirubin Urine: NEGATIVE
GLUCOSE, UA: NEGATIVE mg/dL
HGB URINE DIPSTICK: NEGATIVE
Ketones, ur: NEGATIVE mg/dL
Leukocytes, UA: NEGATIVE
Nitrite: NEGATIVE
PROTEIN: NEGATIVE mg/dL
Specific Gravity, Urine: 1.025 (ref 1.005–1.030)
pH: 6 (ref 5.0–8.0)

## 2016-02-14 LAB — COMPREHENSIVE METABOLIC PANEL
ALBUMIN: 4.2 g/dL (ref 3.5–5.0)
ALK PHOS: 85 U/L (ref 38–126)
ALT: 13 U/L — ABNORMAL LOW (ref 14–54)
ANION GAP: 6 (ref 5–15)
AST: 21 U/L (ref 15–41)
BILIRUBIN TOTAL: 0.7 mg/dL (ref 0.3–1.2)
BUN: 20 mg/dL (ref 6–20)
CALCIUM: 9.5 mg/dL (ref 8.9–10.3)
CO2: 28 mmol/L (ref 22–32)
Chloride: 105 mmol/L (ref 101–111)
Creatinine, Ser: 0.92 mg/dL (ref 0.44–1.00)
GFR calc Af Amer: >60 mL/min (ref >60)
GFR, EST NON AFRICAN AMERICAN: 53 mL/min — AB (ref >60)
GLUCOSE: 90 mg/dL (ref 65–99)
POTASSIUM: 3.6 mmol/L (ref 3.5–5.1)
Sodium: 139 mmol/L (ref 135–145)
TOTAL PROTEIN: 7.2 g/dL (ref 6.5–8.1)

## 2016-02-14 LAB — LIPASE, BLOOD: LIPASE: 23 U/L (ref 11–51)

## 2016-02-14 LAB — CBC WITH DIFFERENTIAL/PLATELET
BASOS ABS: 0 10*3/uL (ref 0.0–0.1)
Basophils Relative: 0 %
EOS PCT: 4 %
Eosinophils Absolute: 0.3 10*3/uL (ref 0.0–0.7)
HCT: 41.7 % (ref 36.0–46.0)
Hemoglobin: 13.3 g/dL (ref 12.0–15.0)
LYMPHS ABS: 1.6 10*3/uL (ref 0.7–4.0)
LYMPHS PCT: 26 %
MCH: 28.6 pg (ref 26.0–34.0)
MCHC: 31.9 g/dL (ref 30.0–36.0)
MCV: 89.7 fL (ref 78.0–100.0)
MONO ABS: 0.5 10*3/uL (ref 0.1–1.0)
Monocytes Relative: 9 %
Neutro Abs: 3.8 10*3/uL (ref 1.7–7.7)
Neutrophils Relative %: 61 %
Platelets: 277 10*3/uL (ref 150–400)
RBC: 4.65 MIL/uL (ref 3.87–5.11)
RDW: 14.5 % (ref 11.5–15.5)
WBC: 6.3 10*3/uL (ref 4.0–10.5)

## 2016-02-14 NOTE — ED Provider Notes (Signed)
CSN: [id number]     Arrival date & time 02/14/16  [id number] History   First MD Initiated Contact with Patient 02/14/16 [phone]     Chief Complaint  Patient presents with  . Abdominal Cramping      HPI Per EMS - patient comes from her son's house complaining of abdominal cramping x2 days. Patient states she sometimes takes a laxative at night for constipation, but has not been taking it over past several days. Patient vague as to why she hasn't been taking it. Patient denies N/V/D and fever. Patient's last BM unknown. Patient's vitals on scene, 160/90, HR 80, 96% on RA (hx of COPD). Past Medical History  Diagnosis Date  . Hypertension   . Arthritis   . Gout   . H/O hiatal hernia   . GERD (gastroesophageal reflux disease)   . Bronchitis   . COPD (chronic obstructive pulmonary disease) [facility](HCC)    Past Surgical History  Procedure Laterality Date  . Appendectomy    . Tee without cardioversion N/A 10/13/2013    Procedure: TRANSESOPHAGEAL ECHOCARDIOGRAM (TEE);  Surgeon: Quintella Reichertraci R Turner, MD;  Location[facility] END[facility]COPY;  Service: Cardiovascular;  Laterality: N/A;   Family History  Problem Relation Age of Onset  . Cancer Mother   . Stroke Father   . Cirrhosis Brother    Social History  Substance Use Topics  . Smoking status: Former Smoker -- 0.50 packs/day for 70 years    Quit date: 08/18/2008  . Smokeless tobacco: Former NeurosurgeonUser    Quit date: 10/13/2004  . Alcohol Use: No   OB History    No data available     Review of Systems  All other systems reviewed and are negative  Allergies  Penicillins  Home Medications   Prior to Admission medications   Medication Sig Start Date End Date Taking? Authorizing Provider  albuterol (PROVENTIL HFA;VENTOLIN HFA) 108 (90 BASE) MCG/ACT inhaler Inhale 2 puffs into the lungs every 4 (four) hours as needed for wheezing or shortness of breath. Patient taking differently: Inhale 2 puffs into the lungs every 4 (four) hours as needed for shortness of  breath (wheezing and shortness of breath ((PLANB))).  11/28/14  Yes Azalia BilisKevin Campos, MD  allopurinol (ZYLOPRIM) 300 MG tablet Take 300 mg by mouth every morning.   Yes Historical Provider, MD  amLODipine (NORVASC) 10 MG tablet Take 10 mg by mouth every evening.  06/07/14  Yes Historical Provider, MD  atenolol (TENORMIN) 25 MG tablet Take 25 mg by mouth 2 (two) times daily.    Yes Historical Provider, MD  atorvastatin (LIPITOR) 20 MG tablet Take 20 mg by mouth at bedtime.   Yes Historical Provider, MD  budesonide (PULMICORT) 0.25 MG/2ML nebulizer solution Take 2 mLs (0.25 mg total) by nebulization 2 (two) times daily. Patient taking differently: Take 0.25 mg by nebulization 3 (three) times daily.  10/12/15  Yes Nyoka CowdenMichael B Wert, MD  famotidine (PEPCID) 20 MG tablet Take 20 mg by mouth at bedtime. 10/19/15  Yes Historical Provider, MD  ibuprofen (ADVIL,MOTRIN) 200 MG tablet Take 200 mg by mouth every 6 (six) hours as needed for moderate pain.    Yes Historical Provider, MD  ipratropium-albuterol (DUONEB) 0.5-2.5 (3) MG/3ML SOLN Take 3 mLs by nebulization every 6 (six) hours as needed. Patient taking differently: Take 3 mLs by nebulization every 4 (four) hours as needed (shortness of breath/ wheezing).  11/28/14  Yes Azalia BilisKevin Campos, MD  losartan (COZAAR) 100 MG tablet Take 50 mg by mouth every morning.  Yes Historical Provider, MD  meclizine (ANTIVERT) 25 MG tablet Take 0.5 tablets (12.5 mg total) by mouth 3 (three) times daily as needed for dizziness. 01/05/16  Yes Arby BarretteMarcy Pfeiffer, MD  oxyCODONE-acetaminophen (PERCOCET/ROXICET) 5-325 MG tablet Take 1 tablet by mouth daily as needed for moderate pain.  12/24/15  Yes Historical Provider, MD  pantoprazole (PROTONIX) 40 MG tablet TAKE 1 TABLET BY MOUTH EVERY DAY 30-60 MINUTES BEFORE FIRST MEAL OF THE DAY 10/22/15  Yes Nyoka CowdenMichael B Wert, MD   BP 159/89 mmHg  Pulse 72  Temp(Src) 97.8 F (36.6 C) (Oral)  Resp 16  SpO2 95% Physical Exam Physical Exam  Nursing note and  vitals reviewed. Constitutional: She is oriented to person, place, and time. She appears well-developed and well-nourished. No distress.  HENT:  Head: Normocephalic and atraumatic.  Eyes: Pupils are equal, round, and reactive to light.  Neck: Normal range of motion.  Cardiovascular: Normal rate and intact distal pulses.   Pulmonary/Chest: No respiratory distress.  Abdominal: Normal appearance. She exhibits no distension.  Musculoskeletal: Normal range of motion.  Neurological: She is alert and oriented to person, place, and time. No cranial nerve deficit.  Skin: Skin is warm and dry. No rash noted.  Psychiatric: She has a normal mood and affect. Her behavior is normal.   ED Course  Procedures (including critical care time) Labs Review Labs Reviewed  COMPREHENSIVE METABOLIC PANEL - Abnormal; Notable for the following:    ALT 13 (*)    GFR calc non Af Amer 53 (*)    All other components within normal limits  URINALYSIS, ROUTINE W REFLEX MICROSCOPIC (NOT AT [facility])  CBC WITH DIFFERENTIAL/PLATELET  LIPASE, BLOOD    Imaging Review Dg Abd 1 View  02/14/2016  CLINICAL DATA:  Abdominal cramping for the past 2-3 days. EXAM: ABDOMEN - 1 VIEW COMPARISON:  01/16/2014. FINDINGS: Normal bowel gas pattern. 5 mm mid right abdominal calcification medially. Atheromatous arterial calcifications, including the aorta. Thoracolumbar spine degenerative changes and mild scoliosis. IMPRESSION: 1. No acute abnormality. 2. 5 mm possible right renal calculus or overlying phlebolith. 3. Aortic atherosclerosis. Electronically Signed   By: Beckie SaltsSteven  Reid M.D.   On: 02/14/2016 09:58   Ct Renal Stone Study  02/14/2016  CLINICAL DATA:  Abdominal pain for 2 days, initial encounter EXAM: CT ABDOMEN AND PELVIS WITHOUT CONTRAST TECHNIQUE: Multidetector CT imaging of the abdomen and pelvis was performed following the standard protocol without IV contrast. COMPARISON:  02/14/2016 FINDINGS: Lower chest: No focal infiltrate or  sizable effusion is noted. Mild fibrotic changes are seen. Hepatobiliary: The liver is within normal limits. The gallbladder is distended with multiple dependent gallstones. Fullness of the common bile duct is seen without definitive obstructing stone. Correlation with laboratory values is recommended. Pancreas: No mass or inflammatory process identified on this un-enhanced exam. Spleen: Within normal limits in size. Adrenals/Urinary Tract: Renal vascular calcifications are noted on the right. A right renal cyst is also seen. No renal or ureteral calculi are seen. The bladder is partially distended. Stomach/Bowel: No evidence of obstruction, inflammatory process, or abnormal fluid collections. The appendix has been surgically removed. Vascular/Lymphatic: Diffuse aortoiliac calcifications are noted without aneurysmal dilatation. Reproductive: No mass or other significant abnormality. Other: None. Musculoskeletal: Degenerative changes of lumbar spine are seen. No acute bony abnormality is noted. IMPRESSION: Cholelithiasis with evidence of biliary ductal dilatation. Given the normal bilirubin level, this is likely related to the patient's given age. The previously seen calcification is related to renal vascular calcifications. No obstructive  changes are seen. Electronically Signed   By: Alcide Clever M.D.   On: 02/14/2016 11:17   I have personally reviewed and evaluated these images and lab results as part of my medical decision-making.  After treatment in the ED the patient feels back to baseline and wants to go home.  MDM   Final diagnoses:  Calculus of gallbladder and bile duct without cholecystitis or obstruction        Nelva Nay, MD 02/14/16 1142

## 2016-02-14 NOTE — Discharge Instructions (Signed)
Cholelithiasis Cholelithiasis (also called gallstones) is a form of gallbladder disease. The gallbladder is a small organ that helps you digest fats. Symptoms of gallstones are:  Feeling sick to your stomach (nausea).  Throwing up (vomiting).  Belly pain.  Yellowing of the skin (jaundice).  Sudden pain. You may feel the pain for minutes to hours.  Fever.  Pain to the touch. HOME CARE  Only take medicines as told by your doctor.  Eat a low-fat diet until you see your doctor again. Eating fat can result in pain.  Follow up with your doctor as told. Attacks usually happen time after time. Surgery is usually needed for permanent treatment. GET HELP RIGHT AWAY IF:   Your pain gets worse.  Your pain is not helped by medicines.  You have a fever and lasting symptoms for more than 2-3 days.  You have a fever and your symptoms suddenly get worse.  You keep feeling sick to your stomach and throwing up. MAKE SURE YOU:   Understand these instructions.  Will watch your condition.  Will get help right away if you are not doing well or get worse.   This information is not intended to replace advice given to you by your health care provider. Make sure you discuss any questions you have with your health care provider.   Document Released: 01/21/2008 Document Revised: 04/06/2013 Document Reviewed: 01/26/2013 Elsevier Interactive Patient Education 2016 Elsevier Inc. Low-Fat Diet for Pancreatitis or Gallbladder Conditions A low-fat diet can be helpful if you have pancreatitis or a gallbladder condition. With these conditions, your pancreas and gallbladder have trouble digesting fats. A healthy eating plan with less fat will help rest your pancreas and gallbladder and reduce your symptoms. WHAT DO I NEED TO KNOW ABOUT THIS DIET?  Eat a low-fat diet.  Reduce your fat intake to less than 20-30% of your total daily calories. This is less than 50-60 g of fat per day.  Remember that you  need some fat in your diet. Ask your dietician what your daily goal should be.  Choose nonfat and low-fat healthy foods. Look for the words "nonfat," "low fat," or "fat free."  As a guide, look on the label and choose foods with less than 3 g of fat per serving. Eat only one serving.  Avoid alcohol.  Do not smoke. If you need help quitting, talk with your health care provider.  Eat small frequent meals instead of three large heavy meals. WHAT FOODS CAN I EAT? Grains Include healthy grains and starches such as potatoes, wheat bread, fiber-rich cereal, and brown rice. Choose whole grain options whenever possible. In adults, whole grains should account for 45-65% of your daily calories.  Fruits and Vegetables Eat plenty of fruits and vegetables. Fresh fruits and vegetables add fiber to your diet. Meats and Other Protein Sources Eat lean meat such as chicken and pork. Trim any fat off of meat before cooking it. Eggs, fish, and beans are other sources of protein. In adults, these foods should account for 10-35% of your daily calories. Dairy Choose low-fat milk and dairy options. Dairy includes fat and protein, as well as calcium.  Fats and Oils Limit high-fat foods such as fried foods, sweets, baked goods, sugary drinks.  Other Creamy sauces and condiments, such as mayonnaise, can add extra fat. Think about whether or not you need to use them, or use smaller amounts or low fat options. WHAT FOODS ARE NOT RECOMMENDED?  High fat foods, such as:  [employer]aked  goods.  Ice cream.  [place] toast.  Sweet rolls.  Pizza.  Cheese bread.  Foods covered with batter, butter, creamy sauces, or cheese.  Fried foods.  Sugary drinks and desserts.  Foods that cause gas or bloating   This information is not intended to replace advice given to you by your health care provider. Make sure you discuss any questions you have with your health care provider.   Document Released: 08/09/2013 Document  Reviewed: 08/09/2013 Elsevier Interactive Patient Education [employer]2016 Elsevier Inc.

## 2016-02-14 NOTE — ED Notes (Signed)
Patient's discharge delayed because she wanted to finish her sandwich before leaving.  EDP Radford PaxBeaton wanted us to encourage her to eat.

## 2016-02-14 NOTE — ED Notes (Signed)
Bed: [id number] Expected date: 02/14/16 Expected time:  Means of arrival:  Comments: EMS 82 y.o. Abdominal cramping

## 2016-02-14 NOTE — ED Notes (Signed)
Per EMS - patient comes from her son's house complaining of abdominal cramping x2 days.  Patient states she sometimes takes a laxative at night for constipation, but has not been taking it over past several days.  Patient vague as to why she hasn't been taking it.  Patient denies N/V/D and fever.  Patient's last BM unknown.  Patient's vitals on scene, 160/90, HR 80, 96% on RA (hx of COPD).

## 2016-02-14 NOTE — ED Notes (Signed)
Patient given a sandwich and coke to drink.

## 2016-02-18 ENCOUNTER — Encounter (HOSPITAL_COMMUNITY): Payer: Self-pay | Admitting: Emergency Medicine

## 2016-02-18 ENCOUNTER — Ambulatory Visit (HOSPITAL_COMMUNITY)
Admission: EM | Admit: 2016-02-18 | Discharge: 2016-02-18 | Disposition: A | Discharge status: Nursing Home (Includes ICF) | Payer: Commercial Managed Care - HMO | Attending: Family Medicine | Admitting: Family Medicine

## 2016-02-18 ENCOUNTER — Emergency Department (HOSPITAL_COMMUNITY): Payer: Commercial Managed Care - HMO

## 2016-02-18 ENCOUNTER — Encounter (HOSPITAL_COMMUNITY): Payer: Self-pay | Admitting: *Deleted

## 2016-02-18 DIAGNOSIS — J439 Emphysema, unspecified: Secondary | ICD-10-CM | POA: Diagnosis not present

## 2016-02-18 DIAGNOSIS — Z87891 Personal history of nicotine dependence: Secondary | ICD-10-CM | POA: Insufficient documentation

## 2016-02-18 DIAGNOSIS — I10 Essential (primary) hypertension: Secondary | ICD-10-CM | POA: Insufficient documentation

## 2016-02-18 DIAGNOSIS — N19 Unspecified kidney failure: Secondary | ICD-10-CM | POA: Diagnosis not present

## 2016-02-18 DIAGNOSIS — Z79899 Other long term (current) drug therapy: Secondary | ICD-10-CM | POA: Diagnosis not present

## 2016-02-18 DIAGNOSIS — Z599 Problem related to housing and economic circumstances, unspecified: Secondary | ICD-10-CM | POA: Diagnosis not present

## 2016-02-18 DIAGNOSIS — R410 Disorientation, unspecified: Secondary | ICD-10-CM

## 2016-02-18 DIAGNOSIS — R05 Cough: Secondary | ICD-10-CM | POA: Insufficient documentation

## 2016-02-18 DIAGNOSIS — J449 Chronic obstructive pulmonary disease, unspecified: Secondary | ICD-10-CM | POA: Diagnosis not present

## 2016-02-18 DIAGNOSIS — R0602 Shortness of breath: Secondary | ICD-10-CM | POA: Diagnosis not present

## 2016-02-18 LAB — CBC WITH DIFFERENTIAL/PLATELET
Basophils Absolute: 0 10*3/uL (ref 0.0–0.1)
Basophils Relative: 0 %
Eosinophils Absolute: 0.3 10*3/uL (ref 0.0–0.7)
Eosinophils Relative: 5 %
HCT: 36.7 % (ref 36.0–46.0)
HEMOGLOBIN: 11.4 g/dL — AB (ref 12.0–15.0)
LYMPHS ABS: 1.9 10*3/uL (ref 0.7–4.0)
LYMPHS PCT: 40 %
MCH: 28.3 pg (ref 26.0–34.0)
MCHC: 31.1 g/dL (ref 30.0–36.0)
MCV: 91.1 fL (ref 78.0–100.0)
MONOS PCT: 9 %
Monocytes Absolute: 0.4 10*3/uL (ref 0.1–1.0)
NEUTROS PCT: 46 %
Neutro Abs: 2.2 10*3/uL (ref 1.7–7.7)
Platelets: 234 10*3/uL (ref 150–400)
RBC: 4.03 MIL/uL (ref 3.87–5.11)
RDW: 14.2 % (ref 11.5–15.5)
WBC: 4.8 10*3/uL (ref 4.0–10.5)

## 2016-02-18 LAB — COMPREHENSIVE METABOLIC PANEL
ALK PHOS: 73 U/L (ref 38–126)
ALT: 10 U/L — AB (ref 14–54)
ANION GAP: 7 (ref 5–15)
AST: 17 U/L (ref 15–41)
Albumin: 3.3 g/dL — ABNORMAL LOW (ref 3.5–5.0)
BILIRUBIN TOTAL: 0.6 mg/dL (ref 0.3–1.2)
BUN: 13 mg/dL (ref 6–20)
CALCIUM: 9.6 mg/dL (ref 8.9–10.3)
CO2: 24 mmol/L (ref 22–32)
CREATININE: 0.88 mg/dL (ref 0.44–1.00)
Chloride: 106 mmol/L (ref 101–111)
GFR, EST NON AFRICAN AMERICAN: 56 mL/min — AB (ref >60)
Glucose, Bld: 90 mg/dL (ref 65–99)
Potassium: 3.2 mmol/L — ABNORMAL LOW (ref 3.5–5.1)
Sodium: 137 mmol/L (ref 135–145)
TOTAL PROTEIN: 5.8 g/dL — AB (ref 6.5–8.1)

## 2016-02-18 NOTE — Care Management (Signed)
ED CM contacted son [name], he states he was not aware of his mother being a the UC left his mother at a friends home. Son is patient caretaker. He is on his to ED to pick up patient. Updated Triage staff.

## 2016-02-18 NOTE — ED Notes (Signed)
Talked with charge regarding need to call social worker.  States he will talk with MD

## 2016-02-18 NOTE — ED Notes (Addendum)
Called [facility] . Spoke with Vira BlancoKim Lapan RN, stated that she was informed that, the patient had wet herself. Kim, stated that she cleaned her, and placed her in scrubs. Patient personal clothing placed in patient belonging bag.  Bag is with patient at this time.

## 2016-02-18 NOTE — ED Notes (Signed)
The patient was apparently dropped off in the lobby by someone. The patient was unable to state who dropped her off. The patient stated that she was here because she thinks she has pneumonia and trash in her lungs. The patient was oriented to person, place and event.

## 2016-02-18 NOTE — ED Provider Notes (Signed)
CSN: [id number]     Arrival date & time 02/18/16  1849 History   First MD Initiated Contact with Patient 02/18/16 1955     Chief Complaint  Patient presents with  . Pneumonia   (Consider location/radiation/quality/duration/timing/severity/associated sxs/prior Treatment) HPI Pt is unsure why she is her, states she does not know how or who brought her to the UC.  Complains of renal failure, and thinks she may have pneumonia. Past Medical History  Diagnosis Date  . Hypertension   . Arthritis   . Gout   . H/O hiatal hernia   . GERD (gastroesophageal reflux disease)   . Bronchitis   . COPD (chronic obstructive pulmonary disease) [facility](HCC)    Past Surgical History  Procedure Laterality Date  . Appendectomy    . Tee without cardioversion N/A 10/13/2013    Procedure: TRANSESOPHAGEAL ECHOCARDIOGRAM (TEE);  Surgeon: Quintella Reichertraci R Turner, MD;  [facility];  Service: Cardiovascular;  Laterality: N/A;   Family History  Problem Relation Age of Onset  . Cancer Mother   . Stroke Father   . Cirrhosis Brother    Social History  Substance Use Topics  . Smoking status: Former Smoker -- 0.50 packs/day for 70 years    Quit date: 08/18/2008  . Smokeless tobacco: Former NeurosurgeonUser    Quit date: 10/13/2004  . Alcohol Use: No   OB History    No data available     Review of Systems Unable to obtain due to confusion of patient. Allergies  Penicillins  Home Medications   Prior to Admission medications   Medication Sig Start Date End Date Taking? Authorizing Provider  albuterol (PROVENTIL HFA;VENTOLIN HFA) 108 (90 BASE) MCG/ACT inhaler Inhale 2 puffs into the lungs every 4 (four) hours as needed for wheezing or shortness of breath. Patient taking differently: Inhale 2 puffs into the lungs every 4 (four) hours as needed for shortness of breath (wheezing and shortness of breath ((PLANB))).  11/28/14   Azalia BilisKevin Campos, MD  allopurinol (ZYLOPRIM) 300 MG tablet Take 300 mg by mouth every morning.     Historical Provider, MD  amLODipine (NORVASC) 10 MG tablet Take 10 mg by mouth every evening.  06/07/14   Historical Provider, MD  atenolol (TENORMIN) 25 MG tablet Take 25 mg by mouth 2 (two) times daily.     Historical Provider, MD  atorvastatin (LIPITOR) 20 MG tablet Take 20 mg by mouth at bedtime.    Historical Provider, MD  budesonide (PULMICORT) 0.25 MG/2ML nebulizer solution Take 2 mLs (0.25 mg total) by nebulization 2 (two) times daily. Patient taking differently: Take 0.25 mg by nebulization 3 (three) times daily.  10/12/15   Nyoka CowdenMichael B Wert, MD  famotidine (PEPCID) 20 MG tablet Take 20 mg by mouth at bedtime. 10/19/15   Historical Provider, MD  ibuprofen (ADVIL,MOTRIN) 200 MG tablet Take 200 mg by mouth every 6 (six) hours as needed for moderate pain.     Historical Provider, MD  ipratropium-albuterol (DUONEB) 0.5-2.5 (3) MG/3ML SOLN Take 3 mLs by nebulization every 6 (six) hours as needed. Patient taking differently: Take 3 mLs by nebulization every 4 (four) hours as needed (shortness of breath/ wheezing).  11/28/14   Azalia BilisKevin Campos, MD  losartan (COZAAR) 100 MG tablet Take 50 mg by mouth every morning.     Historical Provider, MD  meclizine (ANTIVERT) 25 MG tablet Take 0.5 tablets (12.5 mg total) by mouth 3 (three) times daily as needed for dizziness. 01/05/16   Arby BarretteMarcy Pfeiffer, MD  oxyCODONE-acetaminophen (PERCOCET/ROXICET) [id number]  MG tablet Take 1 tablet by mouth daily as needed for moderate pain.  12/24/15   Historical Provider, MD  pantoprazole (PROTONIX) 40 MG tablet TAKE 1 TABLET BY MOUTH EVERY DAY 30-60 MINUTES BEFORE FIRST MEAL OF THE DAY 10/22/15   Nyoka CowdenMichael B Wert, MD   Meds Ordered and Administered this Visit  Medications - No data to display  BP 129/71 mmHg  Pulse 75  Temp(Src) 97.8 F (36.6 C) (Oral)  Resp 14  SpO2 100% No data found.   Physical Exam NURSES NOTES AND VITAL SIGNS REVIEWED. CONSTITUTIONAL:   no acute distress HEENT: normocephalic, atraumatic EYES: Conjunctiva  normal NECK:normal ROM, supple, no adenopathy PULMONARY:No respiratory distress, normal effort ABDOMINAL: Soft, ND, NT BS+, No CVAT MUSCULOSKELETAL: Normal ROM of all extremities,  SKIN: warm and dry without rash PSYCHIATRIC: Confused  ED Course  Procedures (including critical care time)  Labs Review Labs Reviewed - No data to display  Imaging Review No results found.   Visual Acuity Review  Right Eye Distance:   Left Eye Distance:   Bilateral Distance:    Right Eye Near:   Left Eye Near:    Bilateral Near:         MDM   1. Confusion   2. Renal failure    Pt is confused with multiple medical issues. Does not know how she got to UC, or how she could get home. States she plans to stay in the hospital.   Pt needs a higher level of care than can be provided in the UC.    Tharon AquasFrank C Jazzalynn Rhudy, PA 02/18/16 2033

## 2016-02-18 NOTE — ED Notes (Signed)
Patient was dropped off at Urgent Care (no responsible adult was with her just dropped off) Unsure reason to be seen  States sometimes she has trouble with her bowels (denies at this time)  Talking about things that happened in the past and knows she was stating with someone but unsure of the name.  Urgent Care changed her clothing but unsure why they were changed

## 2016-02-19 ENCOUNTER — Emergency Department (HOSPITAL_COMMUNITY)
Admission: EM | Admit: 2016-02-19 | Discharge: 2016-02-19 | Disposition: A | Discharge status: Home / Self Care | Payer: Commercial Managed Care - HMO | Attending: Emergency Medicine | Admitting: Emergency Medicine

## 2016-02-19 ENCOUNTER — Emergency Department (HOSPITAL_COMMUNITY)
Admission: EM | Admit: 2016-02-19 | Discharge: 2016-02-20 | Disposition: A | Discharge status: Home / Self Care | Payer: Commercial Managed Care - HMO | Source: Home / Self Care | Attending: Emergency Medicine | Admitting: Emergency Medicine

## 2016-02-19 ENCOUNTER — Encounter (HOSPITAL_COMMUNITY): Payer: Self-pay | Admitting: Emergency Medicine

## 2016-02-19 DIAGNOSIS — R059 Cough, unspecified: Secondary | ICD-10-CM

## 2016-02-19 DIAGNOSIS — R0602 Shortness of breath: Secondary | ICD-10-CM | POA: Diagnosis not present

## 2016-02-19 DIAGNOSIS — R05 Cough: Secondary | ICD-10-CM

## 2016-02-19 DIAGNOSIS — Z599 Problem related to housing and economic circumstances, unspecified: Secondary | ICD-10-CM | POA: Insufficient documentation

## 2016-02-19 DIAGNOSIS — Z87891 Personal history of nicotine dependence: Secondary | ICD-10-CM

## 2016-02-19 DIAGNOSIS — R4182 Altered mental status, unspecified: Secondary | ICD-10-CM | POA: Diagnosis not present

## 2016-02-19 DIAGNOSIS — R0981 Nasal congestion: Secondary | ICD-10-CM | POA: Diagnosis not present

## 2016-02-19 DIAGNOSIS — R69 Illness, unspecified: Secondary | ICD-10-CM | POA: Diagnosis not present

## 2016-02-19 DIAGNOSIS — R000 Tachycardia, unspecified: Secondary | ICD-10-CM | POA: Diagnosis not present

## 2016-02-19 DIAGNOSIS — Z598 Other problems related to housing and economic circumstances: Secondary | ICD-10-CM

## 2016-02-19 DIAGNOSIS — I10 Essential (primary) hypertension: Secondary | ICD-10-CM

## 2016-02-19 DIAGNOSIS — Z5989 Other problems related to housing and economic circumstances: Secondary | ICD-10-CM

## 2016-02-19 DIAGNOSIS — J449 Chronic obstructive pulmonary disease, unspecified: Secondary | ICD-10-CM | POA: Insufficient documentation

## 2016-02-19 LAB — URINALYSIS, ROUTINE W REFLEX MICROSCOPIC
BILIRUBIN URINE: NEGATIVE
GLUCOSE, UA: NEGATIVE mg/dL
HGB URINE DIPSTICK: NEGATIVE
KETONES UR: NEGATIVE mg/dL
Leukocytes, UA: NEGATIVE
Nitrite: NEGATIVE
PH: 6 (ref 5.0–8.0)
Protein, ur: NEGATIVE mg/dL
SPECIFIC GRAVITY, URINE: 1.011 (ref 1.005–1.030)

## 2016-02-19 MED ORDER — AMLODIPINE BESYLATE 5 MG PO TABS
10.0000 mg | ORAL_TABLET | Freq: Once | ORAL | Status: AC
  Administered 2016-02-19: 10 mg via ORAL
  Filled 2016-02-19: qty 2

## 2016-02-19 MED ORDER — ATENOLOL 25 MG PO TABS
25.0000 mg | ORAL_TABLET | Freq: Once | ORAL | Status: AC
  Administered 2016-02-19: 25 mg via ORAL
  Filled 2016-02-19: qty 1

## 2016-02-19 MED ORDER — IPRATROPIUM-ALBUTEROL 0.5-2.5 (3) MG/3ML IN SOLN
3.0000 mL | Freq: Once | RESPIRATORY_TRACT | Status: AC
  Administered 2016-02-19: 3 mL via RESPIRATORY_TRACT
  Filled 2016-02-19: qty 3

## 2016-02-19 NOTE — ED Notes (Signed)
Patient returned from home due to no one being at home.  Patient remains discharged, will attempt to call nephew in the morning.

## 2016-02-19 NOTE — ED Notes (Signed)
This RN spoke with the pt nephew. Pt nephew states that he was sleeping and that "y'all delivered her to the wrong house."   This RN asked the nephew the correct address. The nephew responded "[address] The nephew then informed this RN that the "power at the house if off until Wednesday and I'm staying at a room."   This RN informed the nephew to come pick up the pt and that the EMS brought the pt back to the ER. The Nephew states that he would have to find someone to give him a ride to the ER. The nephew was informed that this RN would go speak to the Charge RN to see if other arrangements could be made and to call the main ER number in 5 min.

## 2016-02-19 NOTE — Discharge Instructions (Signed)
Cough, Adult Ms. Siegel, your chest xray does not show a pneumonia.  See your primary care doctor within 3 days for close follow up.  If symptoms worsen, come back to the ED immediately. Thank you. A cough helps to clear your throat and lungs. A cough may last only 2-3 weeks (acute), or it may last longer than 8 weeks (chronic). Many different things can cause a cough. A cough may be a sign of an illness or another medical condition. HOME CARE  Pay attention to any changes in your cough.  Take medicines only as told by your doctor.  If you were prescribed an antibiotic medicine, take it as told by your doctor. Do not stop taking it even if you start to feel better.  Talk with your doctor before you try using a cough medicine.  Drink enough fluid to keep your pee (urine) clear or pale yellow.  If the air is dry, use a cold steam vaporizer or humidifier in your home.  Stay away from things that make you cough at work or at home.  If your cough is worse at night, try using extra pillows to raise your head up higher while you sleep.  Do not smoke, and try not to be around smoke. If you need help quitting, ask your doctor.  Do not have caffeine.  Do not drink alcohol.  Rest as needed. GET HELP IF:  You have new problems (symptoms).  You cough up yellow fluid (pus).  Your cough does not get better after 2-3 weeks, or your cough gets worse.  Medicine does not help your cough and you are not sleeping well.  You have pain that gets worse or pain that is not helped with medicine.  You have a fever.  You are losing weight and you do not know why.  You have night sweats. GET HELP RIGHT AWAY IF:  You cough up blood.  You have trouble breathing.  Your heartbeat is very fast.   This information is not intended to replace advice given to you by your health care provider. Make sure you discuss any questions you have with your health care provider.   Document Released:  04/17/2011 Document Revised: 04/25/2015 Document Reviewed: 10/11/2014 Elsevier Interactive Patient Education [employer]2016 Elsevier Inc.

## 2016-02-19 NOTE — ED Provider Notes (Signed)
CSN: [id number]     Arrival date & time 02/18/16  2101 History  By signing my name below, I, [facility] Center[facility]arr[facility] [name], attest that this documentation has been prepared under the direction and in the presence of Tomasita CrumbleAdeleke Humphrey Guerreiro, MD. Electronically Signed: Randell PatientMarrissa [name], ED Scribe. 02/19/2016. 1:45 AM.   Chief Complaint  [name] presents with  . Altered Mental Status    The history is provided by the [name]. No language interpreter was used.    HPI Comments: Katha HammingCallie B Fults is a 82 y.o. female with HTN, arthritis, GERD, bronchitis, and COPD who presents to the Emergency Department complaining of intermittent, mild cough productive of yellow phlegm ongoing for the past week. Pt states that she has had general malaise and feels as if she has pneumonia. She notes that she stays with family and is fearful for her life to the point that she has been unable to sleep. Nurse reports that he spoke with the pt's nephew who she lives with and noted that the pt's current mental status is baseline for her, was brought to Urgent Care yesterday for evaluation where she was confused, and transferred to the ED for further work-up . Denies any pain, fevers, vomiting, diarrhea, dysuria, difficulty urinating, or any other symptoms currently.  Past Medical History  Diagnosis Date  . Hypertension   . Arthritis   . Gout   . H/O hiatal hernia   . GERD (gastroesophageal reflux disease)   . Bronchitis   . COPD (chronic obstructive pulmonary disease) [facility](HCC)    Past Surgical History  Procedure Laterality Date  . Appendectomy    . Tee without cardioversion N/A 10/13/2013    Procedure: TRANSESOPHAGEAL ECHOCARDIOGRAM (TEE);  Surgeon: Quintella Reichertraci R Turner, MD;  [facility];  Service: Cardiovascular;  Laterality: N/A;   Family History  Problem Relation Age of Onset  . Cancer Mother   . Stroke Father   . Cirrhosis Brother    Social History  Substance Use Topics  . Smoking status: Former Smoker -- 0.50 packs/day  for 70 years    Quit date: 08/18/2008  . Smokeless tobacco: Former NeurosurgeonUser    Quit date: 10/13/2004  . Alcohol Use: No   OB History    No data available     Review of Systems A complete 10 system review of systems was obtained and all systems are negative except as noted in the HPI and PMH.    Allergies  Penicillins  Home Medications   Prior to Admission medications   Medication Sig Start Date End Date Taking? Authorizing Provider  albuterol (PROVENTIL HFA;VENTOLIN HFA) 108 (90 BASE) MCG/ACT inhaler Inhale 2 puffs into the lungs every 4 (four) hours as needed for wheezing or shortness of breath. [name] taking differently: Inhale 2 puffs into the lungs every 4 (four) hours as needed for shortness of breath (wheezing and shortness of breath ((PLANB))).  11/28/14   Azalia BilisKevin Campos, MD  allopurinol (ZYLOPRIM) 300 MG tablet Take 300 mg by mouth every morning.    Historical Provider, MD  amLODipine (NORVASC) 10 MG tablet Take 10 mg by mouth every evening.  06/07/14   Historical Provider, MD  atenolol (TENORMIN) 25 MG tablet Take 25 mg by mouth 2 (two) times daily.     Historical Provider, MD  atorvastatin (LIPITOR) 20 MG tablet Take 20 mg by mouth at bedtime.    Historical Provider, MD  budesonide (PULMICORT) 0.25 MG/2ML nebulizer solution Take 2 mLs (0.25 mg total) by nebulization 2 (two) times daily. [name] taking  differently: Take 0.25 mg by nebulization 3 (three) times daily.  10/12/15   Nyoka Cowden, MD  famotidine (PEPCID) 20 MG tablet Take 20 mg by mouth at bedtime. 10/19/15   Historical Provider, MD  ibuprofen (ADVIL,MOTRIN) 200 MG tablet Take 200 mg by mouth every 6 (six) hours as needed for moderate pain.     Historical Provider, MD  ipratropium-albuterol (DUONEB) 0.5-2.5 (3) MG/3ML SOLN Take 3 mLs by nebulization every 6 (six) hours as needed. [name] taking differently: Take 3 mLs by nebulization every 4 (four) hours as needed (shortness of breath/ wheezing).  11/28/14   Azalia Bilis,  MD  losartan (COZAAR) 100 MG tablet Take 50 mg by mouth every morning.     Historical Provider, MD  meclizine (ANTIVERT) 25 MG tablet Take 0.5 tablets (12.5 mg total) by mouth 3 (three) times daily as needed for dizziness. 01/05/16   Arby Barrette, MD  oxyCODONE-acetaminophen (PERCOCET/ROXICET) 5-325 MG tablet Take 1 tablet by mouth daily as needed for moderate pain.  12/24/15   Historical Provider, MD  pantoprazole (PROTONIX) 40 MG tablet TAKE 1 TABLET BY MOUTH EVERY DAY 30-60 MINUTES BEFORE FIRST MEAL OF THE DAY 10/22/15   Nyoka Cowden, MD   BP 186/93 mmHg  Pulse 61  Temp(Src) 97.4 F (36.3 C) (Oral)  Resp 20  Wt 103 lb (46.72 kg)  SpO2 99% Physical Exam  Constitutional: She is oriented to person, place, and time. She appears well-developed and well-nourished. No distress.  HENT:  Head: Normocephalic and atraumatic.  Nose: Nose normal.  Mouth/Throat: Oropharynx is clear and moist. No oropharyngeal exudate.  Eyes: Conjunctivae and EOM are normal. Pupils are equal, round, and reactive to light. No scleral icterus.  Neck: Normal range of motion. Neck supple. No JVD present. No tracheal deviation present. No thyromegaly present.  Cardiovascular: Normal rate, regular rhythm and normal heart sounds.  Exam reveals no gallop and no friction rub.   No murmur heard. Pulmonary/Chest: Effort normal and breath sounds normal. No respiratory distress. She has no wheezes. She exhibits no tenderness.  Abdominal: Soft. Bowel sounds are normal. She exhibits no distension and no mass. There is no tenderness. There is no rebound and no guarding.  Musculoskeletal: Normal range of motion. She exhibits no edema or tenderness.  Lymphadenopathy:    She has no cervical adenopathy.  Neurological: She is alert and oriented to person, place, and time. No cranial nerve deficit. She exhibits normal muscle tone.  Skin: Skin is warm and dry. No rash noted. No erythema. No pallor.  Nursing note and vitals  reviewed.   ED Course  Procedures (including critical care time)  DIAGNOSTIC STUDIES: Oxygen Saturation is 100% on RA, normal by my interpretation.    COORDINATION OF CARE: 1:16 AM Discussed results of chest x-ray. Advised pt to follow-up with her PCP. Will discharge pt. Discussed treatment plan with pt at bedside and pt agreed to plan.  Labs Review Labs Reviewed  CBC WITH DIFFERENTIAL/PLATELET - Abnormal; Notable for the following:    Hemoglobin 11.4 (*)    All other components within normal limits  COMPREHENSIVE METABOLIC PANEL - Abnormal; Notable for the following:    Potassium 3.2 (*)    Total Protein 5.8 (*)    Albumin 3.3 (*)    ALT 10 (*)    GFR calc non Af Amer 56 (*)    All other components within normal limits  URINE CULTURE  URINALYSIS, ROUTINE W REFLEX MICROSCOPIC (NOT AT [facility])    Imaging  Review Dg Chest 2 View  02/18/2016  CLINICAL DATA:  Altered mental status. EXAM: CHEST  2 VIEW COMPARISON:  Radiograph 02/14/2016 FINDINGS: Hyperinflation and emphysema. Fibrotic change in the lung bases. Heart size and mediastinal contours are unchanged there is atherosclerosis of the thoracic aorta. Decreased pleural fluid from prior. Improving left basilar airspace disease, likely improving atelectasis. No new focal opacity. No pneumothorax. Scoliotic curvature of the thoracolumbar spine. IMPRESSION: 1. Improving left lung base aeration, improving atelectasis. No new focal airspace disease. 2. Emphysema and chronic lung disease. 3. Atherosclerosis of the thoracic aorta. Electronically Signed   By: Rubye OaksMelanie  Ehinger M.D.   On: 02/18/2016 22:35   I have personally reviewed and evaluated these images and lab results as part of my medical decision-making.   EKG Interpretation None      MDM   Final diagnoses:  Cough   [name] presents to the Ed for possible AMS and pneumonia.  Upon Ed evaluation she is noted to be at her normal baseline.  We spoke to her son who confirms this.   She is alert and oriented for me. Infectious workup is negative.  She was given her home HTN meds in the Ed.  She is safe for DC. Home.    I personally performed the services described in this documentation, which was scribed in my presence. The recorded information has been reviewed and is accurate.     Tomasita CrumbleAdeleke Javion Holmer, MD 02/19/16 [phone]

## 2016-02-19 NOTE — ED Provider Notes (Signed)
CSN: [id number]     Arrival date & time 02/19/16  1804 History   First MD Initiated Contact with Patient 02/19/16 1820     Chief Complaint  Patient presents with  . Anxiety     (Consider location/radiation/quality/duration/timing/severity/associated sxs/prior Treatment) HPI Comments: 82 year old female with history of hypertension, bronchitis, COPD presents from home via EMS. The patient states that she called 911 because she doesn't like the place she is staying. She states that he has very loud and hot. She says she just needs a good night's rest. She was seen here last night after being seen at an urgent care. At that time her labs were unremarkable her UA was unremarkable. She had a stable chest x-ray. Social work was involved with her case earlier in the day before she was discharged. Patient states she is just tired and that there is too much noise and too many people at the place she has been staying. Per EMS on their arrival the patient ran out of the house that she was staying in and told them that she needed to come back to the emergency department. Patient reports that she is fed where she is staying and that no one is hurting her.  Patient is a 82 y.o. female presenting with anxiety.  Anxiety Associated symptoms include shortness of breath (hronic unchanged). Pertinent negatives include no chest pain and no abdominal pain.    Past Medical History  Diagnosis Date  . Hypertension   . Arthritis   . Gout   . H/O hiatal hernia   . GERD (gastroesophageal reflux disease)   . Bronchitis   . COPD (chronic obstructive pulmonary disease) [facility](HCC)    Past Surgical History  Procedure Laterality Date  . Appendectomy    . Tee without cardioversion N/A 10/13/2013    Procedure: TRANSESOPHAGEAL ECHOCARDIOGRAM (TEE);  Surgeon: Quintella Reichertraci R Turner, MD;  [facility];  Service: Cardiovascular;  Laterality: N/A;   Family History  Problem Relation Age of Onset  . Cancer Mother   . Stroke  Father   . Cirrhosis Brother    Social History  Substance Use Topics  . Smoking status: Former Smoker -- 0.50 packs/day for 70 years    Quit date: 08/18/2008  . Smokeless tobacco: Former NeurosurgeonUser    Quit date: 10/13/2004  . Alcohol Use: No   OB History    No data available     Review of Systems  Constitutional: Positive for fatigue. Negative for fever and chills.  HENT: Negative for congestion, postnasal drip and rhinorrhea.   Eyes: Negative for visual disturbance.  Respiratory: Positive for shortness of breath (hronic unchanged). Negative for cough and chest tightness.   Cardiovascular: Negative for chest pain and palpitations.  Gastrointestinal: Negative for nausea, vomiting, abdominal pain and diarrhea.  Genitourinary: Negative for dysuria, urgency and hematuria.  Skin: Negative for pallor.  Neurological: Negative for dizziness, weakness and numbness.  Hematological: Does not bruise/bleed easily.      Allergies  Penicillins  Home Medications   Prior to Admission medications   Medication Sig Start Date End Date Taking? Authorizing Provider  albuterol (PROVENTIL HFA;VENTOLIN HFA) 108 (90 BASE) MCG/ACT inhaler Inhale 2 puffs into the lungs every 4 (four) hours as needed for wheezing or shortness of breath. Patient taking differently: Inhale 2 puffs into the lungs every 4 (four) hours as needed for shortness of breath (wheezing and shortness of breath ((PLANB))).  11/28/14  Yes Azalia BilisKevin Campos, MD  allopurinol (ZYLOPRIM) 300 MG tablet Take 300  mg by mouth every morning.   Yes Historical Provider, MD  amLODipine (NORVASC) 10 MG tablet Take 10 mg by mouth every evening.  06/07/14  Yes Historical Provider, MD  atenolol (TENORMIN) 25 MG tablet Take 25 mg by mouth daily.    Yes Historical Provider, MD  atorvastatin (LIPITOR) 20 MG tablet Take 20 mg by mouth at bedtime.   Yes Historical Provider, MD  budesonide (PULMICORT) 0.25 MG/2ML nebulizer solution Take 2 mLs (0.25 mg total) by  nebulization 2 (two) times daily. Patient taking differently: Take 0.25 mg by nebulization 3 (three) times daily.  10/12/15  Yes Nyoka Cowden, MD  famotidine (PEPCID) 20 MG tablet Take 20 mg by mouth at bedtime. 10/19/15  Yes Historical Provider, MD  ibuprofen (ADVIL,MOTRIN) 200 MG tablet Take 200 mg by mouth every 6 (six) hours as needed for moderate pain.    Yes Historical Provider, MD  ipratropium-albuterol (DUONEB) 0.5-2.5 (3) MG/3ML SOLN Take 3 mLs by nebulization every 6 (six) hours as needed. Patient taking differently: Take 3 mLs by nebulization every 4 (four) hours as needed (shortness of breath/ wheezing).  11/28/14  Yes Azalia Bilis, MD  losartan (COZAAR) 100 MG tablet Take 50 mg by mouth every morning.    Yes Historical Provider, MD  meclizine (ANTIVERT) 25 MG tablet Take 0.5 tablets (12.5 mg total) by mouth 3 (three) times daily as needed for dizziness. 01/05/16  Yes Arby Barrette, MD  oxyCODONE-acetaminophen (PERCOCET/ROXICET) 5-325 MG tablet Take 1 tablet by mouth daily as needed for moderate pain.  12/24/15  Yes Historical Provider, MD  pantoprazole (PROTONIX) 40 MG tablet TAKE 1 TABLET BY MOUTH EVERY DAY 30-60 MINUTES BEFORE FIRST MEAL OF THE DAY 10/22/15   Nyoka Cowden, MD   BP 155/96 mmHg  Pulse 62  Temp(Src) 98.1 F (36.7 C) (Oral)  Resp 18  SpO2 97% Physical Exam  Constitutional: She is oriented to person, place, and time. She appears well-developed and well-nourished. No distress.  HENT:  Head: Normocephalic and atraumatic.  Right Ear: External ear normal.  Left Ear: External ear normal.  Nose: Nose normal.  Mouth/Throat: Oropharynx is clear and moist. No oropharyngeal exudate.  Eyes: EOM are normal. Pupils are equal, round, and reactive to light.  Neck: Normal range of motion. Neck supple.  Cardiovascular: Normal rate, regular rhythm and intact distal pulses.   Pulmonary/Chest: Effort normal. No respiratory distress. She has wheezes (mild, bilateral, symmetric). She has  no rales.  Abdominal: Soft. She exhibits no distension. There is no tenderness.  Musculoskeletal: Normal range of motion. She exhibits no edema or tenderness.  Neurological: She is alert and oriented to person, place, and time.  Skin: Skin is warm and dry. No rash noted. She is not diaphoretic.  Vitals reviewed.   ED Course  Procedures (including critical care time) Labs Review Labs Reviewed - No data to display  Imaging Review Dg Chest 2 View  02/18/2016  CLINICAL DATA:  Altered mental status. EXAM: CHEST  2 VIEW COMPARISON:  Radiograph 02/14/2016 FINDINGS: Hyperinflation and emphysema. Fibrotic change in the lung bases. Heart size and mediastinal contours are unchanged there is atherosclerosis of the thoracic aorta. Decreased pleural fluid from prior. Improving left basilar airspace disease, likely improving atelectasis. No new focal opacity. No pneumothorax. Scoliotic curvature of the thoracolumbar spine. IMPRESSION: 1. Improving left lung base aeration, improving atelectasis. No new focal airspace disease. 2. Emphysema and chronic lung disease. 3. Atherosclerosis of the thoracic aorta. Electronically Signed   By: Rubye Oaks  M.D.   On: 02/18/2016 22:35   I have personally reviewed and evaluated these images and lab results as part of my medical decision-making.   EKG Interpretation None      MDM  Patient was seen and evaluated in stable condition. No acute finding on examination. Results from last night reviewed. Patient was given a breathing treatment for her mild wheezing. Patient ambulated in the department without difficulty. She kept telling me that she just needed to get a good night's rest. Multiple phone conversations were had with social work, and [name] the patient's family member, Lysbeth PennerSheila, [name]'s significant other. At the end of all the conversations the plan was for the patient to be picked up by Denita LungKeisha [name]'s significant other. Both [name] and the patient were in agreement  with this plan. Final diagnoses:  None    1. Social issue    Leta BaptistEmily Roe Nguyen, MD 02/20/16 [phone]

## 2016-02-19 NOTE — ED Notes (Signed)
Patient Alert and oriented X4. Stable and ambulatory. Patient verbalized understanding of the discharge instructions.  Patient belongings were taken by the patient. PTAR took patient home.  Nephew [name] was confirmed to be home when we talked earlier.
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
## 2016-02-19 NOTE — ED Notes (Signed)
Spoke with [name] pt's nephew who states he is out of town; [name] states to send pt back to address that she picked up from; [name] provided [address] [place] to; [name] states that is the address for Velna HatchetSheila; RN asked [name] to call Velna HatchetSheila to let her know pt will be returning there; [name] to call back in 5 minutes

## 2016-02-19 NOTE — ED Notes (Signed)
Nephew contacted x2. No answer; voicemail left again

## 2016-02-19 NOTE — ED Notes (Addendum)
[name] (nephew) here to see about patient.  Patient states she needs fluids and wants to be seen by a doctor.  Need to call him when she is ready to go home. Number in chart  Nephew reassured this nurse he would answer the phone whenever we called and he would be able to come and get her when she was discharged
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
## 2016-02-19 NOTE — ED Notes (Signed)
Patient comes via EMS. EMS states that patient frantically ran out of house saying she needed to go to hospital. Patients vitals on scene 130/81, 89 HR, resp 16, 95% room air, CBG 96. No SOB, Denies N/V. EMS states she was discharged this morning from ED diagnosed with a cough. States patient is between households. Patient states she isnt able to rest at home and she feels SOB. HR 89, 100 % on room air, 130/87, Temp 98.4 oral.

## 2016-02-19 NOTE — ED Notes (Signed)
Attempted to contact pt nephew. No answer voicemail left to contact this RN

## 2016-02-19 NOTE — Progress Notes (Signed)
Nephew here to pick Patient up. CSW explained to Patient that she has been medically cleared for discharge. Nurse present to assist Patient to the bathroom. No further concerns at this time. CSW signing off. Please contact if new need(s) arise.          Lance MussAshley Gardner,MSW, LCSW [facility] ED/38M Clinical Social Worker [phone]

## 2016-02-19 NOTE — ED Notes (Signed)
Caller stating he is pt's son states will be here in 15 minutes to pick up his mother.  This RN advised pt will be ready.

## 2016-02-19 NOTE — ED Notes (Signed)
Pt left with nephew, [name].  Pt and nephew expressed understanding of discharge instructions and follow-up guidelines.  Pt initially expressed concern about cleanliness of temporary housing.  Pt's nephew states they are staying with at girlfriend's apartment temporarily.  Pt states housing is "fine for now."  Glenwood[place]ley, CSW, made aware.

## 2016-02-19 NOTE — ED Notes (Signed)
Talked with [name] about patient's cognition and [name] states that she is at her baseline.  He lives with the patient at her house and is unsure if he can get a ride to come take her home.  This RN with consult with MD about possible transport options.

## 2016-02-19 NOTE — ED Notes (Signed)
Attempted to contact nephew via phone number in the chart. Phone is going straight to voicemail. Voicemail left by Peter Kiewit SonsCharge RN   Jazmine,RN

## 2016-02-19 NOTE — ED Notes (Signed)
Spoke with Kathreen DevoidKesha GF of [name]; Dellia CloudKesha [facility]; confirmed ok with [name] and pt that it was ok for her to pick patient up; Dellia CloudKesha can be reached at [phone] if needed.

## 2016-02-19 NOTE — ED Notes (Signed)
Still awaiting contact from the nephew to contact the ER.

## 2016-02-19 NOTE — ED Notes (Signed)
Pt ambulated self efficiently to restroom with minimal help.

## 2016-02-19 NOTE — ED Notes (Signed)
Pt ambulated to the end of of the hall independently without difficulty and back. Pt placed in new brief by this RN.

## 2016-02-20 LAB — URINE CULTURE

## 2016-02-20 NOTE — Discharge Instructions (Signed)
Your examination tonight was benign. He did have some mild wheezing consistent with her history of COPD. Your results from last night are reassuring. Please take your medications as prescribed outpatient. Please follow-up with your primary care physician.

## 2016-02-20 NOTE — ED Notes (Signed)
Kathreen DevoidKesha GF of [name] came to check on patient; Dellia CloudKesha was not comfortable taking pt home without first speaking with [name] or Velna HatchetSheila; [name] and Velna HatchetSheila called by RN with no answer; Dellia CloudKesha advised she was going to go to the address on file and would be back before 7 am.

## 2016-02-20 NOTE — ED Notes (Signed)
Dellia CloudKesha called back regarding pt; pt is to be sent back to Ms.Sheila's house at [employer] Dellia CloudKesha will meet PTAR at address once they arrive with pt;

## 2016-02-20 NOTE — ED Notes (Signed)
Pt family now here.

## 2016-02-20 NOTE — ED Notes (Signed)
[name]'s Address:[address] [name]

## 2016-02-20 NOTE — ED Notes (Signed)
[name] called to say he was a few minutes away; PTAR at bedside but was given the ok to leave; [name] arrived to pick pt up

## 2016-02-20 NOTE — ED Notes (Signed)
RN attempted to call Social worker to inform of results and how pt is getting home; Child psychotherapistocial worker off duty at time of call.

## 2016-02-21 ENCOUNTER — Encounter (HOSPITAL_COMMUNITY): Payer: Self-pay | Admitting: Nurse Practitioner

## 2016-02-21 ENCOUNTER — Emergency Department (HOSPITAL_COMMUNITY): Payer: Commercial Managed Care - HMO

## 2016-02-21 ENCOUNTER — Emergency Department (HOSPITAL_COMMUNITY)
Admission: EM | Admit: 2016-02-21 | Discharge: 2016-02-22 | Disposition: A | Discharge status: Home / Self Care | Payer: Commercial Managed Care - HMO | Attending: Emergency Medicine | Admitting: Emergency Medicine

## 2016-02-21 ENCOUNTER — Telehealth: Payer: Self-pay | Admitting: *Deleted

## 2016-02-21 DIAGNOSIS — I10 Essential (primary) hypertension: Secondary | ICD-10-CM | POA: Insufficient documentation

## 2016-02-21 DIAGNOSIS — R05 Cough: Secondary | ICD-10-CM | POA: Diagnosis not present

## 2016-02-21 DIAGNOSIS — E860 Dehydration: Secondary | ICD-10-CM

## 2016-02-21 DIAGNOSIS — Z79899 Other long term (current) drug therapy: Secondary | ICD-10-CM | POA: Insufficient documentation

## 2016-02-21 DIAGNOSIS — R0602 Shortness of breath: Secondary | ICD-10-CM | POA: Diagnosis not present

## 2016-02-21 DIAGNOSIS — Z87891 Personal history of nicotine dependence: Secondary | ICD-10-CM | POA: Insufficient documentation

## 2016-02-21 DIAGNOSIS — R000 Tachycardia, unspecified: Secondary | ICD-10-CM | POA: Insufficient documentation

## 2016-02-21 DIAGNOSIS — J449 Chronic obstructive pulmonary disease, unspecified: Secondary | ICD-10-CM | POA: Diagnosis not present

## 2016-02-21 LAB — BASIC METABOLIC PANEL
Anion gap: 9 (ref 5–15)
BUN: 8 mg/dL (ref 6–20)
CO2: 25 mmol/L (ref 22–32)
CREATININE: 0.81 mg/dL (ref 0.44–1.00)
Calcium: 9 mg/dL (ref 8.9–10.3)
Chloride: 107 mmol/L (ref 101–111)
GFR calc Af Amer: >60 mL/min (ref >60)
Glucose, Bld: 116 mg/dL — ABNORMAL HIGH (ref 65–99)
Potassium: 3.1 mmol/L — ABNORMAL LOW (ref 3.5–5.1)
SODIUM: 141 mmol/L (ref 135–145)

## 2016-02-21 LAB — CBC WITH DIFFERENTIAL/PLATELET
BASOS ABS: 0 10*3/uL (ref 0.0–0.1)
Basophils Relative: 0 %
EOS ABS: 0.1 10*3/uL (ref 0.0–0.7)
EOS PCT: 3 %
HCT: 37.2 % (ref 36.0–46.0)
Hemoglobin: 12.4 g/dL (ref 12.0–15.0)
Lymphocytes Relative: 49 %
Lymphs Abs: 2.5 10*3/uL (ref 0.7–4.0)
MCH: 29.9 pg (ref 26.0–34.0)
MCHC: 33.3 g/dL (ref 30.0–36.0)
MCV: 89.6 fL (ref 78.0–100.0)
Monocytes Absolute: 0.3 10*3/uL (ref 0.1–1.0)
Monocytes Relative: 6 %
Neutro Abs: 2.1 10*3/uL (ref 1.7–7.7)
Neutrophils Relative %: 42 %
PLATELETS: 187 10*3/uL (ref 150–400)
RBC: 4.15 MIL/uL (ref 3.87–5.11)
RDW: 13.9 % (ref 11.5–15.5)
WBC: 5 10*3/uL (ref 4.0–10.5)

## 2016-02-21 LAB — URINALYSIS, ROUTINE W REFLEX MICROSCOPIC
Bilirubin Urine: NEGATIVE
Glucose, UA: NEGATIVE mg/dL
HGB URINE DIPSTICK: NEGATIVE
KETONES UR: NEGATIVE mg/dL
LEUKOCYTES UA: NEGATIVE
Nitrite: NEGATIVE
PROTEIN: NEGATIVE mg/dL
Specific Gravity, Urine: 1.016 (ref 1.005–1.030)
pH: 7 (ref 5.0–8.0)

## 2016-02-21 LAB — I-STAT ARTERIAL BLOOD GAS, ED
ACID-BASE EXCESS: 3 mmol/L — AB (ref 0.0–2.0)
Bicarbonate: 26.6 mEq/L — ABNORMAL HIGH (ref 20.0–24.0)
O2 SAT: 94 %
PCO2 ART: 38 mmHg (ref 35.0–45.0)
PH ART: 7.452 — AB (ref 7.350–7.450)
TCO2: 28 mmol/L (ref 0–100)
pO2, Arterial: 66 mmHg — ABNORMAL LOW (ref 80.0–100.0)

## 2016-02-21 MED ORDER — SODIUM CHLORIDE 0.9 % IV BOLUS (SEPSIS)
500.0000 mL | Freq: Once | INTRAVENOUS | Status: AC
  Administered 2016-02-21: 500 mL via INTRAVENOUS

## 2016-02-21 NOTE — ED Notes (Addendum)
Per EMS pt endorses shortness of breath staring today and progressively getting worse. Wheezing auscultated bilaterally in upper and lower lobes. Patient endorses cough with yellow phlegm for the past few days  Pt given 5mg  of albuterol

## 2016-02-21 NOTE — ED Notes (Addendum)
Patient assisted to bedside commode to obtain urine sample. After exertion from sitting up and standing and sitting patient SpO2 went from 94%/RA to 85%/RA patient and RR 26. Pt. SpO2 quickly returned above 92%/RA with rest and slow deep breathing. Pt in NAD at present. [place] MD made aware.

## 2016-02-21 NOTE — ED Notes (Signed)
Post ED Visit - Positive Culture Follow-up  Culture report reviewed by antimicrobial stewardship pharmacist:  []  Enzo BiNathan Batchelder, Pharm.D. []  Celedonio MiyamotoJeremy Frens, [address]Ph[address] BCPS [x]  Garvin FilaMike Maccia, Pharm.D. []  Georgina PillionElizabeth Martin, Pharm.D., BCPS []  KaunakakaiMinh Pham, [address]Ph[address] BCPS, AAHIVP []  Estella HuskMichelle Turner, Pharm.D., BCPS, AAHIVP []  Tennis Mustassie Stewart, Pharm.D. []  Sherle Poeob Vincent, [place]arm[place]D.  Positive urine culture No further patient follow-up is required at this time per Terance HartKelly Gekas, PA-C.  Virl AxeRobertson, Mycal Conde [facility] 02/21/2016, 10:26 AM

## 2016-02-21 NOTE — ED Notes (Signed)
Social work at bedside.  

## 2016-02-21 NOTE — Progress Notes (Signed)
CSW spoke with pt's son, [name], re: pt's d/c.  Per [name], his kids' mother, Reene will be here around 11:00 to pick pt up and take her home.

## 2016-02-21 NOTE — ED Provider Notes (Signed)
CSN: [id number]     Arrival date & time 02/21/16  1647 History   First MD Initiated Contact with Patient 02/21/16 1651     Chief Complaint  Patient presents with  . Shortness of Breath   Pt is a 82 yo bf who called EMS today for SOB.  EMS said that pt ran out of the house to meet them.  Pt has been here multiple times recently for social issues with her home and case management and social work has been involved.   EMS gave pt 5 mg of albuterol for wheezing en route.  Pt requests IVFs.  EMS said that a niece lives with pt some of the time, but there was no one there when they picked her up.  (Consider location/radiation/quality/duration/timing/severity/associated sxs/prior Treatment) Patient is a 82 y.o. female presenting with shortness of breath. The history is provided by the patient and the EMS personnel.  Shortness of Breath Severity:  Mild Onset quality:  Sudden Timing:  Unable to specify Progression:  Improving Associated symptoms: wheezing     Past Medical History  Diagnosis Date  . Hypertension   . Arthritis   . Gout   . H/O hiatal hernia   . GERD (gastroesophageal reflux disease)   . Bronchitis   . COPD (chronic obstructive pulmonary disease) [facility])    Past Surgical History  Procedure Laterality Date  . Appendectomy    . Tee without cardioversion N/A 10/13/2013    Procedure: TRANSESOPHAGEAL ECHOCARDIOGRAM (TEE);  Surgeon: Quintella Reichert, MD;  Location: [facility] ENDOSCOPY;  Service: Cardiovascular;  Laterality: N/A;   Family History  Problem Relation Age of Onset  . Cancer Mother   . Stroke Father   . Cirrhosis Brother    Social History  Substance Use Topics  . Smoking status: Former Smoker -- 0.50 packs/day for 70 years    Quit date: 08/18/2008  . Smokeless tobacco: Former Neurosurgeon    Quit date: 10/13/2004  . Alcohol Use: No   OB History    No data available     Review of Systems  Respiratory: Positive for shortness of breath and wheezing.   All other systems  reviewed and are negative.     Allergies  Penicillins  Home Medications   Prior to Admission medications   Medication Sig Start Date End Date Taking? Authorizing Provider  albuterol (PROVENTIL HFA;VENTOLIN HFA) 108 (90 BASE) MCG/ACT inhaler Inhale 2 puffs into the lungs every 4 (four) hours as needed for wheezing or shortness of breath. Patient taking differently: Inhale 2 puffs into the lungs every 4 (four) hours as needed for shortness of breath (wheezing and shortness of breath ((PLANB))).  11/28/14  Yes Azalia Bilis, MD  allopurinol (ZYLOPRIM) 300 MG tablet Take 300 mg by mouth every morning.   Yes Historical Provider, MD  amLODipine (NORVASC) 10 MG tablet Take 10 mg by mouth every evening.  06/07/14  Yes Historical Provider, MD  atenolol (TENORMIN) 25 MG tablet Take 25 mg by mouth daily.    Yes Historical Provider, MD  atorvastatin (LIPITOR) 20 MG tablet Take 20 mg by mouth at bedtime.   Yes Historical Provider, MD  budesonide (PULMICORT) 0.25 MG/2ML nebulizer solution Take 2 mLs (0.25 mg total) by nebulization 2 (two) times daily. Patient taking differently: Take 0.25 mg by nebulization 3 (three) times daily.  10/12/15  Yes Nyoka Cowden, MD  famotidine (PEPCID) 20 MG tablet Take 20 mg by mouth at bedtime. 10/19/15  Yes Historical Provider, MD  ibuprofen (ADVIL,MOTRIN)  200 MG tablet Take 200 mg by mouth every 6 (six) hours as needed for moderate pain.    Yes Historical Provider, MD  ipratropium-albuterol (DUONEB) 0.5-2.5 (3) MG/3ML SOLN Take 3 mLs by nebulization every 6 (six) hours as needed. Patient taking differently: Take 3 mLs by nebulization every 4 (four) hours as needed (shortness of breath/ wheezing).  11/28/14  Yes Azalia BilisKevin Campos, MD  losartan (COZAAR) 100 MG tablet Take 50 mg by mouth every morning.    Yes Historical Provider, MD  oxyCODONE-acetaminophen (PERCOCET/ROXICET) 5-325 MG tablet Take 1 tablet by mouth daily as needed for moderate pain.  12/24/15  Yes Historical Provider,  MD  chlorpheniramine-HYDROcodone (TUSSIONEX) 10-8 MG/5ML SUER Take 5 mLs by mouth every 12 (twelve) hours as needed. For cough 02/20/16   Historical Provider, MD  lactose free nutrition (BOOST) LIQD Take 237 mLs by mouth 3 (three) times daily between meals.    Historical Provider, MD  meclizine (ANTIVERT) 25 MG tablet Take 0.5 tablets (12.5 mg total) by mouth 3 (three) times daily as needed for dizziness. Patient not taking: Reported on 02/21/2016 01/05/16   Arby BarretteMarcy Pfeiffer, MD  pantoprazole (PROTONIX) 40 MG tablet TAKE 1 TABLET BY MOUTH EVERY DAY 30-60 MINUTES BEFORE FIRST MEAL OF THE DAY Patient not taking: Reported on 02/21/2016 10/22/15   Nyoka CowdenMichael B Wert, MD   BP 140/84 mmHg  Pulse 94  Temp(Src) 98.3 F (36.8 C) (Oral)  Resp 25  SpO2 98% Physical Exam  Constitutional: She is oriented to person, place, and time. She appears well-developed and well-nourished.  HENT:  Head: Normocephalic and atraumatic.  Right Ear: External ear normal.  Left Ear: External ear normal.  Nose: Nose normal.  Mouth/Throat: Oropharynx is clear and moist.  Eyes: Conjunctivae and EOM are normal. Pupils are equal, round, and reactive to light.  Neck: Normal range of motion. Neck supple.  Cardiovascular: Normal heart sounds and intact distal pulses.  Tachycardia present.   Pulmonary/Chest: Effort normal and breath sounds normal.  Abdominal: Soft. Bowel sounds are normal.  Musculoskeletal: Normal range of motion.  Neurological: She is alert and oriented to person, place, and time.  Skin: Skin is warm and dry.  Psychiatric: Her behavior is normal. Judgment and thought content normal. Her mood appears anxious.  Nursing note and vitals reviewed.   ED Course  Procedures (including critical care time) Labs Review Labs Reviewed  BASIC METABOLIC PANEL - Abnormal; Notable for the following:    Potassium 3.1 (*)    Glucose, Bld 116 (*)    All other components within normal limits  I-STAT ARTERIAL BLOOD GAS, ED -  Abnormal; Notable for the following:    pH, Arterial 7.452 (*)    pO2, Arterial 66.0 (*)    Bicarbonate 26.6 (*)    Acid-Base Excess 3.0 (*)    All other components within normal limits  CBC WITH DIFFERENTIAL/PLATELET  URINALYSIS, ROUTINE W REFLEX MICROSCOPIC (NOT AT [facility])    Imaging Review No results found. I have personally reviewed and evaluated these images and lab results as part of my medical decision-making.   EKG Interpretation   Date/Time:  Thursday February 21 2016 16:50:14 EDT Ventricular Rate:  116 PR Interval:    QRS Duration: 81 QT Interval:  456 QTC Calculation: 634 R Axis:   40 Text Interpretation:  Sinus tachycardia Atrial premature complex Biatrial  enlargement Left ventricular hypertrophy Anterior Q waves, possibly due to  LVH Prolonged QT interval Confirmed by [facility] MD, Tailynn Armetta ([id number]) on  02/21/2016 5:14:37 PM  MDM  Initial tachycardia due to albuterol that EMS gave to pt.    Pt is ambulatory and looks in no distress.  Pt said that if she is discharged, she is just going to keep on coming back.  She said that she needs some rest.  I spoke with SW who had multiple conversations with pt's son [name].  There is someone named Renee, whom he said he pays to stay with pt, who is coming to pick up pt.  Final diagnoses:  Dehydration       Jacalyn LefevreJulie Gypsy Kellogg, MD 02/27/16 [phone]

## 2016-02-21 NOTE — Progress Notes (Signed)
ED Antimicrobial Stewardship Positive Culture Follow Up   [name] is an 82 y.o. female who [facility] with a chief complaint of  Chief Complaint  Patient presents with  . Anxiety    Recent Results (from the past 720 hour(s))  Urine culture     Status: Abnormal   Collection Time: 02/19/16  1:16 AM  Result Value Ref Range Status   Specimen Description URINE, CLEAN CATCH  Final   Special Requests NONE  Final   Culture (A)  Final    >=100,000 COLONIES/mL DIPHTHEROIDS(CORYNEBACTERIUM SPECIES) Standardized susceptibility testing for this organism is not available.    Report Status 02/20/2016 FINAL  Final   Treatment not indicated  ED Provider: Terance HartKelly Gekas, PA-C  Bertram MillardMichael A Tandrea Kommer 02/21/2016, 8:08 AM Infectious Diseases Pharmacist Phone# [phone]
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
## 2016-02-23 ENCOUNTER — Emergency Department (HOSPITAL_COMMUNITY): Payer: Commercial Managed Care - HMO

## 2016-02-23 ENCOUNTER — Encounter (HOSPITAL_COMMUNITY): Payer: Self-pay | Admitting: Emergency Medicine

## 2016-02-23 ENCOUNTER — Emergency Department (HOSPITAL_COMMUNITY)
Admission: EM | Admit: 2016-02-23 | Discharge: 2016-02-25 | Disposition: A | Discharge status: Skilled Nursing Facility | Payer: Commercial Managed Care - HMO | Attending: Emergency Medicine | Admitting: Emergency Medicine

## 2016-02-23 DIAGNOSIS — J449 Chronic obstructive pulmonary disease, unspecified: Secondary | ICD-10-CM | POA: Diagnosis not present

## 2016-02-23 DIAGNOSIS — M1990 Unspecified osteoarthritis, unspecified site: Secondary | ICD-10-CM | POA: Diagnosis not present

## 2016-02-23 DIAGNOSIS — Z791 Long term (current) use of non-steroidal anti-inflammatories (NSAID): Secondary | ICD-10-CM | POA: Insufficient documentation

## 2016-02-23 DIAGNOSIS — R0602 Shortness of breath: Secondary | ICD-10-CM | POA: Insufficient documentation

## 2016-02-23 DIAGNOSIS — Z79899 Other long term (current) drug therapy: Secondary | ICD-10-CM | POA: Diagnosis not present

## 2016-02-23 DIAGNOSIS — Z87891 Personal history of nicotine dependence: Secondary | ICD-10-CM | POA: Insufficient documentation

## 2016-02-23 DIAGNOSIS — I10 Essential (primary) hypertension: Secondary | ICD-10-CM | POA: Diagnosis not present

## 2016-02-23 DIAGNOSIS — R05 Cough: Secondary | ICD-10-CM | POA: Diagnosis present

## 2016-02-23 LAB — BASIC METABOLIC PANEL
Anion gap: 8 (ref 5–15)
BUN: 17 mg/dL (ref 6–20)
CO2: 26 mmol/L (ref 22–32)
CREATININE: 1 mg/dL (ref 0.44–1.00)
Calcium: 9.1 mg/dL (ref 8.9–10.3)
Chloride: 104 mmol/L (ref 101–111)
GFR calc Af Amer: 55 mL/min — ABNORMAL LOW (ref >60)
GFR, EST NON AFRICAN AMERICAN: 48 mL/min — AB (ref >60)
Glucose, Bld: 90 mg/dL (ref 65–99)
POTASSIUM: 3 mmol/L — AB (ref 3.5–5.1)
SODIUM: 138 mmol/L (ref 135–145)

## 2016-02-23 LAB — CBC
HCT: 34 % — ABNORMAL LOW (ref 36.0–46.0)
Hemoglobin: 11.4 g/dL — ABNORMAL LOW (ref 12.0–15.0)
MCH: 28.7 pg (ref 26.0–34.0)
MCHC: 33.5 g/dL (ref 30.0–36.0)
MCV: 85.6 fL (ref 78.0–100.0)
PLATELETS: 223 10*3/uL (ref 150–400)
RBC: 3.97 MIL/uL (ref 3.87–5.11)
RDW: 14.1 % (ref 11.5–15.5)
WBC: 6.4 10*3/uL (ref 4.0–10.5)

## 2016-02-23 NOTE — ED Notes (Signed)
Pt returned to the ED today to be admitted for cough, productive with white sputum. Pt also states she needs to be in the hospital for some rest and therapy.  Pt states she does not have a home, last night she stayed with her granson's babies momma. Pt states she was told to come to ED but MD will not admit until Monday. Only family her with patient is Brother in law's son who knows nothing of the situation.

## 2016-02-24 LAB — URINALYSIS, ROUTINE W REFLEX MICROSCOPIC
GLUCOSE, UA: NEGATIVE mg/dL
HGB URINE DIPSTICK: NEGATIVE
KETONES UR: NEGATIVE mg/dL
Nitrite: NEGATIVE
PH: 5.5 (ref 5.0–8.0)
Protein, ur: NEGATIVE mg/dL
SPECIFIC GRAVITY, URINE: 1.019 (ref 1.005–1.030)

## 2016-02-24 LAB — URINE MICROSCOPIC-ADD ON: Bacteria, UA: NONE SEEN

## 2016-02-24 MED ORDER — POTASSIUM CHLORIDE CRYS ER 20 MEQ PO TBCR
40.0000 meq | EXTENDED_RELEASE_TABLET | Freq: Once | ORAL | Status: AC
  Administered 2016-02-24: 40 meq via ORAL
  Filled 2016-02-24: qty 2

## 2016-02-24 NOTE — ED Notes (Signed)
Bed: [id number] Expected date:  Expected time:  Means of arrival:  Comments: For 25 per charge RN
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
## 2016-02-24 NOTE — ED Provider Notes (Signed)
CSN: [id number]     Arrival date & time 02/23/16  1937 History   First MD Initiated Contact with Patient 02/23/16 2037     Chief Complaint  Patient presents with  . Cough   HPI   [name] is a 82 y.o. female PMH significant for hypertension, arthritis, COPD presenting to the emergency department because "I feel worn down and need rest and I just want a break from everyone." She states she does not have a home and last night she stayed with her grandson's girlfriend. She has currently asymptomatic and denies fevers, chills, pain, cough, nausea, vomiting, changes in bowel/bladder habits.  Of note, patient was seen on 7/6 with unremarkable workup and told the ED provider that if she is discharged "I will just come back."  Past Medical History  Diagnosis Date  . Hypertension   . Arthritis   . Gout   . H/O hiatal hernia   . GERD (gastroesophageal reflux disease)   . Bronchitis   . COPD (chronic obstructive pulmonary disease) [facility])    Past Surgical History  Procedure Laterality Date  . Appendectomy    . Tee without cardioversion N/A 10/13/2013    Procedure: TRANSESOPHAGEAL ECHOCARDIOGRAM (TEE);  Surgeon: Quintella Reichert, MD;  Location: [facility] ENDOSCOPY;  Service: Cardiovascular;  Laterality: N/A;   Family History  Problem Relation Age of Onset  . Cancer Mother   . Stroke Father   . Cirrhosis Brother    Social History  Substance Use Topics  . Smoking status: Former Smoker -- 0.50 packs/day for 70 years    Quit date: 08/18/2008  . Smokeless tobacco: Former Neurosurgeon    Quit date: 10/13/2004  . Alcohol Use: No   OB History    No data available     Review of Systems  Ten systems are reviewed and are negative for acute change except as noted in the HPI  Allergies  Penicillins  Home Medications   Prior to Admission medications   Medication Sig Start Date End Date Taking? Authorizing Provider  allopurinol (ZYLOPRIM) 300 MG tablet Take 300 mg by mouth every morning.   Yes  Historical Provider, MD  amLODipine (NORVASC) 10 MG tablet Take 10 mg by mouth every evening.  06/07/14  Yes Historical Provider, MD  atenolol (TENORMIN) 25 MG tablet Take 25 mg by mouth daily.    Yes Historical Provider, MD  atorvastatin (LIPITOR) 20 MG tablet Take 20 mg by mouth at bedtime.   Yes Historical Provider, MD  chlorpheniramine-HYDROcodone (TUSSIONEX) 10-8 MG/5ML SUER Take 5 mLs by mouth every 12 (twelve) hours as needed. For cough 02/20/16  Yes Historical Provider, MD  famotidine (PEPCID) 20 MG tablet Take 20 mg by mouth at bedtime. 10/19/15  Yes Historical Provider, MD  ibuprofen (ADVIL,MOTRIN) 200 MG tablet Take 200 mg by mouth every 6 (six) hours as needed for moderate pain.    Yes Historical Provider, MD  lactose free nutrition (BOOST) LIQD Take 237 mLs by mouth 3 (three) times daily between meals.   Yes Historical Provider, MD  losartan (COZAAR) 100 MG tablet Take 50 mg by mouth every morning.    Yes Historical Provider, MD  oxyCODONE-acetaminophen (PERCOCET/ROXICET) 5-325 MG tablet Take 1 tablet by mouth daily as needed for moderate pain.  12/24/15  Yes Historical Provider, MD  albuterol (PROVENTIL HFA;VENTOLIN HFA) 108 (90 BASE) MCG/ACT inhaler Inhale 2 puffs into the lungs every 4 (four) hours as needed for wheezing or shortness of breath. Patient taking differently: Inhale 2  puffs into the lungs every 4 (four) hours as needed for shortness of breath (wheezing and shortness of breath ((PLANB))).  11/28/14   Azalia Bilis, MD  budesonide (PULMICORT) 0.25 MG/2ML nebulizer solution Take 2 mLs (0.25 mg total) by nebulization 2 (two) times daily. Patient taking differently: Take 0.25 mg by nebulization 3 (three) times daily.  10/12/15   Nyoka Cowden, MD  ipratropium-albuterol (DUONEB) 0.5-2.5 (3) MG/3ML SOLN Take 3 mLs by nebulization every 6 (six) hours as needed. Patient taking differently: Take 3 mLs by nebulization every 4 (four) hours as needed (shortness of breath/ wheezing).  11/28/14    Azalia Bilis, MD  meclizine (ANTIVERT) 25 MG tablet Take 0.5 tablets (12.5 mg total) by mouth 3 (three) times daily as needed for dizziness. Patient not taking: Reported on 02/21/2016 01/05/16   Arby Barrette, MD  pantoprazole (PROTONIX) 40 MG tablet TAKE 1 TABLET BY MOUTH EVERY DAY 30-60 MINUTES BEFORE FIRST MEAL OF THE DAY Patient not taking: Reported on 02/21/2016 10/22/15   Nyoka Cowden, MD   BP 103/80 mmHg  Pulse 80  Temp(Src) 98.3 F (36.8 C)  Resp 18  Ht  (1.651 m)  Wt 46.72 kg  BMI 17.14 kg/m2  SpO2 98% Physical Exam  Constitutional: She is oriented to person, place, and time. She appears well-developed and well-nourished. No distress.  HENT:  Head: Normocephalic and atraumatic.  Right Ear: External ear normal.  Left Ear: External ear normal.  Nose: Nose normal.  Mouth/Throat: Oropharynx is clear and moist. No oropharyngeal exudate.  Senile arcus bilaterally  Eyes: Conjunctivae are normal. Pupils are equal, round, and reactive to light. Right eye exhibits no discharge. Left eye exhibits no discharge. No scleral icterus.  Neck: Normal range of motion. No tracheal deviation present.  Cardiovascular: Normal rate, regular rhythm, normal heart sounds and intact distal pulses.  Exam reveals no gallop and no friction rub.   No murmur heard. Pulmonary/Chest: Effort normal and breath sounds normal. No respiratory distress. She has no wheezes. She has no rales. She exhibits no tenderness.  Abdominal: Soft. Bowel sounds are normal. She exhibits no distension and no mass. There is no tenderness. There is no rebound and no guarding.  Musculoskeletal: Normal range of motion. She exhibits no edema.  Strength 5 out of 5 throughout. Neurovascularly intact bilaterally.  Lymphadenopathy:    She has no cervical adenopathy.  Neurological: She is alert and oriented to person, place, and time. Coordination normal.  Cranial nerves II through XII grossly intact. Normal finger to nose, pronator  drift, rapid alternating movement.   Skin: Skin is warm and dry. No rash noted. She is not diaphoretic. No erythema.  Psychiatric: She has a normal mood and affect. Her behavior is normal.  Nursing note and vitals reviewed.  ED Course  Procedures  Labs Review Labs Reviewed  CBC - Abnormal; Notable for the following:    Hemoglobin 11.4 (*)    HCT 34.0 (*)    All other components within normal limits  BASIC METABOLIC PANEL - Abnormal; Notable for the following:    Potassium 3.0 (*)    GFR calc non Af Amer 48 (*)    GFR calc Af Amer 55 (*)    All other components within normal limits  URINALYSIS, ROUTINE W REFLEX MICROSCOPIC (NOT AT [facility])   Imaging Review Dg Chest 2 View  02/23/2016  CLINICAL DATA:  Shortness of breath EXAM: CHEST  2 VIEW COMPARISON:  02/21/2016 FINDINGS: Mild eventration of left hemidiaphragm with  associated mild left basilar opacity, likely atelectasis. No pleural effusion or pneumothorax. The heart is normal in size. Visualized osseous structures are within normal limits. IMPRESSION: Mild left basilar opacity, likely atelectasis. Electronically Signed   By: Charline BillsSriyesh  Krishnan M.D.   On: 02/23/2016 21:08   I have personally reviewed and evaluated these images and lab results as part of my medical decision-making.  MDM   Final diagnoses:     Patient nontoxic appearing, VSS. Patient is asymptomatic. Case management consulted but they are not available currently. Patient will need to be seen tomorrow by them.  Labs and xray at baseline. UA pending.  Care handoff to [facility] Humes, PA-C at shift change.   Melton KrebsSamantha Nicole Princess Karnes, PA-C 02/24/16 0044  Lorre NickAnthony Allen, MD 02/29/16 [phone]

## 2016-02-24 NOTE — ED Notes (Signed)
Pt moved to room 25 for observation. Pt ambulating with one assist to BR.
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
## 2016-02-24 NOTE — Evaluation (Signed)
Physical Therapy Evaluation Patient Details Name: [name] MRN: [id number] DOB: Jun 11, 1923 Today's Date: 02/24/2016   History of Present Illness  Pt came to the ED with c/o of coughing  and weakness.   Clinical Impression  Pt very pleasant and doing fairly well however noticed some generalized weakness and some balance impairments of which she would benefit from PT to improve her safety.     Follow Up Recommendations Supervision/Assistance - 24 hour (would benfit from STSNF is able, or family assisting consistently at home until more steady on feett for safety. )    Equipment Recommendations  None recommended by PT    Recommendations for Other Services       Precautions / Restrictions Precautions Precautions: Fall Precaution Comments: pt states she has had some falls in teh past due to feeling "swimmy headed"       Mobility  Bed Mobility Overal bed mobility: Modified Independent                Transfers Overall transfer level: Needs assistance Equipment used: 1 person hand held assist Transfers: Sit to/from Stand Sit to Stand: Min assist         General transfer comment: Min A initially due to patient stated she was a littel dizzy. She sat back downa dn we waited and perfomred some LE exercises, then stood again with Min guard.   Ambulation/Gait Ambulation/Gait assistance: Min assist Ambulation Distance (Feet): 80 Feet Assistive device: 1 person hand held assist Gait Pattern/deviations: Step-through pattern     General Gait Details: most of time Min guard with 1 hand assist, however , 3 occassionas with some scissoring for balance and Min gard to assist . Worked on marching, and back stepping whcih challenged higher level balance activities.   Stairs            Wheelchair Mobility    Modified Rankin (Stroke Patients Only)       Balance Overall balance assessment: Needs assistance Sitting-balance support: No upper extremity supported;Feet  supported Sitting balance-Leahy Scale: Good     Standing balance support: Single extremity supported Standing balance-Leahy Scale: Fair Standing balance comment: performed marching and backward stepping with some min gard to minA required. Pt did self correct in these situations. "my legs feel a littel weak"                              Pertinent Vitals/Pain Pain Assessment: 0-10 Pain Score: 3  Pain Location: pt staes she has a headache and has these frequently, "I take some muscle ache aspirin at home for my headaches" Pain Descriptors / Indicators: Aching;Constant Pain Intervention(s): Monitored during session;Limited activity within patient's tolerance;Patient requesting pain meds-RN notified    Home Living Family/patient expects to be discharged to:: Private residence Living Arrangements: Non-relatives/Friends Available Help at Discharge:  (unclear.. will let Social worker work with this pateint for that)                  Prior Function Level of Independence: Independent with assistive device(s)         Comments: little unclear , states she occiassioaly uses a cane or walker outside or longer distances and has to push the grocery cart for support when out. Lately she states she has felt more fatigued with moving around. States depending on where she stays she may have to do steps and down seems a little easier than up.  Hand Dominance        Extremity/Trunk Assessment               Lower Extremity Assessment: Generalized weakness         Communication   Communication: No difficulties  Cognition Arousal/Alertness: Awake/alert Behavior During Therapy: WFL for tasks assessed/performed Overall Cognitive Status: Within Functional Limits for tasks assessed (unable to tell if patient with some slight confusion or not. Functional during our assessment)                      General Comments      Exercises        Assessment/Plan     PT Assessment Patient needs continued PT services  PT Diagnosis Difficulty walking;Generalized weakness   PT Problem List Decreased strength;Decreased activity tolerance;Decreased balance;Decreased mobility  PT Treatment Interventions Gait training;Functional mobility training;Therapeutic activities;Therapeutic exercise;Patient/family education;Balance training   PT Goals (Current goals can be found in the Care Plan section) Acute Rehab PT Goals Patient Stated Goal: I want to feel a little stronger on my feet  PT Goal Formulation: With patient Time For Goal Achievement: 03/09/16 Potential to Achieve Goals: Good    Frequency Min 3X/week   Barriers to discharge   unlcear of what assistance or supervision she has in the home or not (Child psychotherapistocial worker working on this) . I would recommend initially 24hr supervision and PRN assist until more steady and strong on her feet     Co-evaluation               End of Session Equipment Utilized During Treatment: Gait belt Activity Tolerance: Patient tolerated treatment well Patient left: in bed;with family/visitor present Nurse Communication: Mobility status    Functional Assessment Tool Used: clincial judgement Functional Limitation: Mobility: Walking and moving around Mobility: Walking and Moving Around Current Status (O1308(G8978): At least 1 percent but less than 20 percent impaired, limited or restricted Mobility: Walking and Moving Around Goal Status [phone](G8979): 0 percent impaired, limited or restricted    Time: 1550-1614 PT Time Calculation (min) (ACUTE ONLY): 24 min   Charges:   PT Evaluation $PT Eval Low Complexity: 1 Procedure PT Treatments $Gait Training: 8-22 mins   PT G Codes:   PT G-Codes **NOT FOR INPATIENT CLASS** Functional Assessment Tool Used: clincial judgement Functional Limitation: Mobility: Walking and moving around Mobility: Walking and Moving Around Current Status (O9629(G8978): At least 1 percent but less than 20 percent  impaired, limited or restricted Mobility: Walking and Moving Around Goal Status [phone](G8979): 0 percent impaired, limited or restricted    Marissia Blackham 02/24/2016, 4:26 PM  Marella BileSharron Bruna Dills, PT Pager: [phone] 02/24/2016

## 2016-02-24 NOTE — ED Notes (Signed)
Patient requesting chaplain visit.
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
## 2016-02-24 NOTE — ED Notes (Signed)
Chaplain at bedside

## 2016-02-24 NOTE — ED Provider Notes (Signed)
SW has seen and evaluated the patient.  They request PT / OT eval.  This has been ordered.  Patient remains in similar condition, awaiting their eval and further SW consult for possible assistance with housing.  Gerhard Munchobert Brooklee Michelin, MD 02/24/16 [phone]

## 2016-02-25 ENCOUNTER — Encounter (HOSPITAL_COMMUNITY): Payer: Self-pay | Admitting: *Deleted

## 2016-02-25 DIAGNOSIS — J8410 Pulmonary fibrosis, unspecified: Secondary | ICD-10-CM | POA: Diagnosis not present

## 2016-02-25 DIAGNOSIS — Z791 Long term (current) use of non-steroidal anti-inflammatories (NSAID): Secondary | ICD-10-CM | POA: Diagnosis not present

## 2016-02-25 DIAGNOSIS — E784 Other hyperlipidemia: Secondary | ICD-10-CM | POA: Diagnosis not present

## 2016-02-25 DIAGNOSIS — Z79899 Other long term (current) drug therapy: Secondary | ICD-10-CM | POA: Diagnosis not present

## 2016-02-25 DIAGNOSIS — R2681 Unsteadiness on feet: Secondary | ICD-10-CM | POA: Diagnosis not present

## 2016-02-25 DIAGNOSIS — I10 Essential (primary) hypertension: Secondary | ICD-10-CM | POA: Diagnosis not present

## 2016-02-25 DIAGNOSIS — M6281 Muscle weakness (generalized): Secondary | ICD-10-CM | POA: Diagnosis not present

## 2016-02-25 DIAGNOSIS — R350 Frequency of micturition: Secondary | ICD-10-CM | POA: Diagnosis not present

## 2016-02-25 DIAGNOSIS — M109 Gout, unspecified: Secondary | ICD-10-CM | POA: Diagnosis not present

## 2016-02-25 DIAGNOSIS — J449 Chronic obstructive pulmonary disease, unspecified: Secondary | ICD-10-CM | POA: Diagnosis not present

## 2016-02-25 DIAGNOSIS — R309 Painful micturition, unspecified: Secondary | ICD-10-CM | POA: Diagnosis not present

## 2016-02-25 DIAGNOSIS — N390 Urinary tract infection, site not specified: Secondary | ICD-10-CM | POA: Diagnosis not present

## 2016-02-25 DIAGNOSIS — J189 Pneumonia, unspecified organism: Secondary | ICD-10-CM | POA: Diagnosis not present

## 2016-02-25 DIAGNOSIS — K219 Gastro-esophageal reflux disease without esophagitis: Secondary | ICD-10-CM | POA: Diagnosis not present

## 2016-02-25 DIAGNOSIS — E43 Unspecified severe protein-calorie malnutrition: Secondary | ICD-10-CM | POA: Diagnosis not present

## 2016-02-25 DIAGNOSIS — R54 Age-related physical debility: Secondary | ICD-10-CM | POA: Diagnosis not present

## 2016-02-25 DIAGNOSIS — R0602 Shortness of breath: Secondary | ICD-10-CM | POA: Diagnosis not present

## 2016-02-25 DIAGNOSIS — M1990 Unspecified osteoarthritis, unspecified site: Secondary | ICD-10-CM | POA: Diagnosis not present

## 2016-02-25 DIAGNOSIS — Z87891 Personal history of nicotine dependence: Secondary | ICD-10-CM | POA: Diagnosis not present

## 2016-02-25 MED ORDER — ALBUTEROL SULFATE HFA 108 (90 BASE) MCG/ACT IN AERS
2.0000 | INHALATION_SPRAY | Freq: Four times a day (QID) | RESPIRATORY_TRACT | Status: DC
  Administered 2016-02-25: 2 via RESPIRATORY_TRACT
  Filled 2016-02-25: qty 6.7

## 2016-02-25 NOTE — NC FL2 (Signed)
[place] MEDICAID FL2 LEVEL OF CARE SCREENING TOOL     IDENTIFICATION  Patient Name: [name] Birthdate: 10/20/1922 Sex: female Admission Date (Current Location): 02/23/2016  [facility] [place]   Facility and Address:  [facility],  [address], [place] [id number]      Provider Number: [id number]  Attending Physician Name and Address:  Provider Default, MD  Relative Name and Phone Number:       Current Level of Care: Hospital Recommended Level of Care: Skilled Nursing Facility Prior Approval Number:    Date Approved/Denied:   PASRR Number: [id number]  Discharge Plan: SNF    Current Diagnoses: Patient Active Problem List   Diagnosis Date Noted  . Postinflammatory pulmonary fibrosis (HCC) 04/29/2015  . Dyspnea 04/26/2015  . Severe malnutrition (HCC) 02/23/2015  . AKI (acute kidney injury) (HCC) 02/22/2015  . Arthritis 02/22/2015  . Weakness   . Dehydration 02/21/2015  . ARF (acute renal failure) (HCC) 10/03/2014  . Underweight 07/20/2014  . Acute bronchitis 07/20/2014  . COPD GOLD I with reversibility  07/18/2014  . Essential hypertension 07/18/2014  . Gout 07/18/2014  . SBO (small bowel obstruction) (HCC) 01/14/2014  . Hypercalcemia 01/14/2014  . Protein-calorie malnutrition, severe (HCC) 10/10/2013  . Acute respiratory failure (HCC) 10/07/2013  . Hypokalemia 10/07/2013  . H. influenzae septicemia (HCC) 12/06/2012  . Leukocytopenia, unspecified 12/03/2012  . HTN (hypertension) 12/02/2012  . Gouty arthritis 12/02/2012    Orientation RESPIRATION BLADDER Height & Weight     Self, Time, Situation, Place  Normal   Weight: 103 lb (46.72 kg) Height:  5\' 5"  (165.1 cm)  BEHAVIORAL SYMPTOMS/MOOD NEUROLOGICAL BOWEL NUTRITION STATUS           AMBULATORY STATUS COMMUNICATION OF NEEDS Skin   Limited Assist Verbally Normal                       Personal Care Assistance Level of Assistance  Dressing, Bathing Bathing  Assistance: Limited assistance   Dressing Assistance: Limited assistance     Functional Limitations Info             SPECIAL CARE FACTORS FREQUENCY                       Contractures      Additional Factors Info  Allergies   Allergies Info: penicillins           Current Medications (02/25/2016):  This is the current hospital active medication list Current Facility-Administered Medications  Medication Dose Route Frequency Provider Last Rate Last Dose  . albuterol (PROVENTIL HFA;VENTOLIN HFA) 108 (90 Base) MCG/ACT inhaler 2 puff  2 puff Inhalation Q6H Antony MaduraKelly Humes, PA-C   2 puff at 02/25/16 0620   Current Outpatient Prescriptions  Medication Sig Dispense Refill  . allopurinol (ZYLOPRIM) 300 MG tablet Take 300 mg by mouth every morning.    Marland Kitchen. amLODipine (NORVASC) 10 MG tablet Take 10 mg by mouth every evening.     Marland Kitchen. atenolol (TENORMIN) 25 MG tablet Take 25 mg by mouth daily.     Marland Kitchen. atorvastatin (LIPITOR) 20 MG tablet Take 20 mg by mouth at bedtime.    . chlorpheniramine-HYDROcodone (TUSSIONEX) 10-8 MG/5ML SUER Take 5 mLs by mouth every 12 (twelve) hours as needed. For cough  0  . famotidine (PEPCID) 20 MG tablet Take 20 mg by mouth at bedtime.  2  . ibuprofen (ADVIL,MOTRIN) 200 MG tablet Take 200 mg by  mouth every 6 (six) hours as needed for moderate pain.     Marland Kitchen lactose free nutrition (BOOST) LIQD Take 237 mLs by mouth 3 (three) times daily between meals.    Marland Kitchen losartan (COZAAR) 100 MG tablet Take 50 mg by mouth every morning.     Marland Kitchen oxyCODONE-acetaminophen (PERCOCET/ROXICET) 5-325 MG tablet Take 1 tablet by mouth daily as needed for moderate pain.   0  . albuterol (PROVENTIL HFA;VENTOLIN HFA) 108 (90 BASE) MCG/ACT inhaler Inhale 2 puffs into the lungs every 4 (four) hours as needed for wheezing or shortness of breath. (Patient taking differently: Inhale 2 puffs into the lungs every 4 (four) hours as needed for shortness of breath (wheezing and shortness of breath ((PLANB))).  ) 1 Inhaler 0  . budesonide (PULMICORT) 0.25 MG/2ML nebulizer solution Take 2 mLs (0.25 mg total) by nebulization 2 (two) times daily. (Patient taking differently: Take 0.25 mg by nebulization 3 (three) times daily. ) 120 mL 11  . ipratropium-albuterol (DUONEB) 0.5-2.5 (3) MG/3ML SOLN Take 3 mLs by nebulization every 6 (six) hours as needed. (Patient taking differently: Take 3 mLs by nebulization every 4 (four) hours as needed (shortness of breath/ wheezing). ) 360 mL 3  . meclizine (ANTIVERT) 25 MG tablet Take 0.5 tablets (12.5 mg total) by mouth 3 (three) times daily as needed for dizziness. (Patient not taking: Reported on 02/21/2016) 20 tablet 0  . pantoprazole (PROTONIX) 40 MG tablet TAKE 1 TABLET BY MOUTH EVERY DAY 30-60 MINUTES BEFORE FIRST MEAL OF THE DAY (Patient not taking: Reported on 02/21/2016) 30 tablet 0  . [DISCONTINUED] ipratropium (ATROVENT) 0.02 % nebulizer solution Take 2.5 mLs (0.5 mg total) by nebulization 4 (four) times daily. 75 mL 0     Discharge Medications: Please see discharge summary for a list of discharge medications.  Relevant Imaging Results:  Relevant Lab Results:   Additional Information    Claudean Severance, LCSW

## 2016-02-25 NOTE — ED Notes (Signed)
Pt states she had more belongings than what she has.  Looked around the department and nothing was found.

## 2016-02-25 NOTE — ED Notes (Signed)
[facility] called for transport.  

## 2016-02-25 NOTE — Progress Notes (Addendum)
CSW staffed case with Asst. Social Work Interior and spatial designerDirector and contacted [facility]. Spoke with Clydie BraunKaren, Admissions and she stated she would need to run patient's insurance and CSW would need to obtain authorization. CSW spoke with Amy [phone] to obtain authorization. She stated patient would have to be accepted before authorization could be obtained. Spoke with Clydie BraunKaren in Admissions and she stated they would accept patient at their facility. She asked if FL2 and PT note could be faxed. Information faxed to [phone]. Spoke with Amy and authorization was obtained. Authorization#: [id number]. She stated she had already provided the number to [facility]Karen at [facility]. CSW confirmed with Clydie BraunKaren and she stated she had the authorization number. She stated patient could be sent now and provided the number to call report. She stated when the nurse calls, she should ask to speak with the nurse for 101A. CSW spoke with nurse for patient and provided her with number for report and information.   CSW spoke with patient to inform her that [facility]. Patient seemed pleased that she was going to this facility.      CSW attempted call to [name], as patient stated it was fine to inform him that she was going [facility]. Message left for him that patient is going [facility] today with name, address, and contact number for the facility.  Call received from Jennette Kettleendra Marcus, Adult Protective Services Social Worker, [place]. She inquired about patient and CSW informed her that [facility] [place].  Elenore PaddyLaVonia Laveta [name], LCSWA [phone] ED CSW 02/25/2016 11:29 AM

## 2016-02-25 NOTE — ED Notes (Signed)
Pt awake and requesting supplies to wash herself.  Pt ambulating in room with a steady gait.

## 2016-02-28 DIAGNOSIS — J449 Chronic obstructive pulmonary disease, unspecified: Secondary | ICD-10-CM | POA: Diagnosis not present

## 2016-02-28 DIAGNOSIS — R54 Age-related physical debility: Secondary | ICD-10-CM | POA: Diagnosis not present

## 2016-02-28 DIAGNOSIS — J8410 Pulmonary fibrosis, unspecified: Secondary | ICD-10-CM | POA: Diagnosis not present

## 2016-02-28 DIAGNOSIS — I10 Essential (primary) hypertension: Secondary | ICD-10-CM | POA: Diagnosis not present

## 2016-02-28 DIAGNOSIS — M109 Gout, unspecified: Secondary | ICD-10-CM | POA: Diagnosis not present

## 2016-03-05 DIAGNOSIS — R350 Frequency of micturition: Secondary | ICD-10-CM | POA: Diagnosis not present

## 2016-03-05 DIAGNOSIS — N390 Urinary tract infection, site not specified: Secondary | ICD-10-CM | POA: Diagnosis not present

## 2016-03-05 DIAGNOSIS — R309 Painful micturition, unspecified: Secondary | ICD-10-CM | POA: Diagnosis not present

## 2016-03-18 ENCOUNTER — Encounter (HOSPITAL_COMMUNITY): Payer: Self-pay | Admitting: Nurse Practitioner

## 2016-03-18 ENCOUNTER — Inpatient Hospital Stay (HOSPITAL_COMMUNITY)
Admission: EM | Admit: 2016-03-18 | Discharge: 2016-03-21 | DRG: 682 | Disposition: A | Discharge status: Skilled Nursing Facility | Payer: Commercial Managed Care - HMO | Attending: Internal Medicine | Admitting: Internal Medicine

## 2016-03-18 ENCOUNTER — Emergency Department (HOSPITAL_COMMUNITY): Payer: Commercial Managed Care - HMO

## 2016-03-18 DIAGNOSIS — R05 Cough: Secondary | ICD-10-CM | POA: Diagnosis not present

## 2016-03-18 DIAGNOSIS — S37009D Unspecified injury of unspecified kidney, subsequent encounter: Secondary | ICD-10-CM | POA: Diagnosis not present

## 2016-03-18 DIAGNOSIS — M109 Gout, unspecified: Secondary | ICD-10-CM | POA: Diagnosis present

## 2016-03-18 DIAGNOSIS — J40 Bronchitis, not specified as acute or chronic: Secondary | ICD-10-CM | POA: Diagnosis present

## 2016-03-18 DIAGNOSIS — I10 Essential (primary) hypertension: Secondary | ICD-10-CM | POA: Diagnosis present

## 2016-03-18 DIAGNOSIS — R531 Weakness: Secondary | ICD-10-CM

## 2016-03-18 DIAGNOSIS — Z681 Body mass index (BMI) 19 or less, adult: Secondary | ICD-10-CM

## 2016-03-18 DIAGNOSIS — K219 Gastro-esophageal reflux disease without esophagitis: Secondary | ICD-10-CM | POA: Diagnosis not present

## 2016-03-18 DIAGNOSIS — R062 Wheezing: Secondary | ICD-10-CM

## 2016-03-18 DIAGNOSIS — J440 Chronic obstructive pulmonary disease with acute lower respiratory infection: Secondary | ICD-10-CM | POA: Diagnosis present

## 2016-03-18 DIAGNOSIS — N179 Acute kidney failure, unspecified: Principal | ICD-10-CM | POA: Diagnosis present

## 2016-03-18 DIAGNOSIS — R404 Transient alteration of awareness: Secondary | ICD-10-CM | POA: Diagnosis not present

## 2016-03-18 DIAGNOSIS — J449 Chronic obstructive pulmonary disease, unspecified: Secondary | ICD-10-CM | POA: Diagnosis not present

## 2016-03-18 DIAGNOSIS — Z87891 Personal history of nicotine dependence: Secondary | ICD-10-CM

## 2016-03-18 DIAGNOSIS — E876 Hypokalemia: Secondary | ICD-10-CM | POA: Diagnosis not present

## 2016-03-18 DIAGNOSIS — E860 Dehydration: Secondary | ICD-10-CM | POA: Diagnosis not present

## 2016-03-18 DIAGNOSIS — I951 Orthostatic hypotension: Secondary | ICD-10-CM | POA: Diagnosis present

## 2016-03-18 DIAGNOSIS — E43 Unspecified severe protein-calorie malnutrition: Secondary | ICD-10-CM | POA: Diagnosis present

## 2016-03-18 DIAGNOSIS — M6281 Muscle weakness (generalized): Secondary | ICD-10-CM | POA: Diagnosis not present

## 2016-03-18 DIAGNOSIS — R0602 Shortness of breath: Secondary | ICD-10-CM | POA: Diagnosis not present

## 2016-03-18 DIAGNOSIS — R41841 Cognitive communication deficit: Secondary | ICD-10-CM | POA: Diagnosis not present

## 2016-03-18 DIAGNOSIS — R42 Dizziness and giddiness: Secondary | ICD-10-CM

## 2016-03-18 DIAGNOSIS — R279 Unspecified lack of coordination: Secondary | ICD-10-CM | POA: Diagnosis not present

## 2016-03-18 DIAGNOSIS — R4182 Altered mental status, unspecified: Secondary | ICD-10-CM | POA: Diagnosis not present

## 2016-03-18 DIAGNOSIS — Z823 Family history of stroke: Secondary | ICD-10-CM | POA: Diagnosis not present

## 2016-03-18 HISTORY — DX: Unspecified chronic bronchitis: J42

## 2016-03-18 HISTORY — DX: Pure hypercholesterolemia, unspecified: E78.00

## 2016-03-18 LAB — URINALYSIS, ROUTINE W REFLEX MICROSCOPIC
GLUCOSE, UA: NEGATIVE mg/dL
HGB URINE DIPSTICK: NEGATIVE
Ketones, ur: NEGATIVE mg/dL
Nitrite: NEGATIVE
PH: 5 (ref 5.0–8.0)
Protein, ur: NEGATIVE mg/dL
SPECIFIC GRAVITY, URINE: 1.021 (ref 1.005–1.030)

## 2016-03-18 LAB — COMPREHENSIVE METABOLIC PANEL
ALBUMIN: 3.2 g/dL — AB (ref 3.5–5.0)
ALK PHOS: 74 U/L (ref 38–126)
ALT: 9 U/L — AB (ref 14–54)
ANION GAP: 13 (ref 5–15)
AST: 20 U/L (ref 15–41)
BUN: 28 mg/dL — ABNORMAL HIGH (ref 6–20)
CALCIUM: 9.4 mg/dL (ref 8.9–10.3)
CHLORIDE: 98 mmol/L — AB (ref 101–111)
CO2: 21 mmol/L — AB (ref 22–32)
CREATININE: 1.61 mg/dL — AB (ref 0.44–1.00)
GFR calc non Af Amer: 27 mL/min — ABNORMAL LOW (ref >60)
GFR, EST AFRICAN AMERICAN: 31 mL/min — AB (ref >60)
GLUCOSE: 139 mg/dL — AB (ref 65–99)
Potassium: 3.1 mmol/L — ABNORMAL LOW (ref 3.5–5.1)
SODIUM: 132 mmol/L — AB (ref 135–145)
Total Bilirubin: 0.5 mg/dL (ref 0.3–1.2)
Total Protein: 6.2 g/dL — ABNORMAL LOW (ref 6.5–8.1)

## 2016-03-18 LAB — URINE MICROSCOPIC-ADD ON

## 2016-03-18 LAB — CBC WITH DIFFERENTIAL/PLATELET
Basophils Absolute: 0 10*3/uL (ref 0.0–0.1)
Basophils Relative: 0 %
EOS ABS: 0 10*3/uL (ref 0.0–0.7)
Eosinophils Relative: 0 %
HCT: 35.1 % — ABNORMAL LOW (ref 36.0–46.0)
HEMOGLOBIN: 11.4 g/dL — AB (ref 12.0–15.0)
LYMPHS ABS: 0.8 10*3/uL (ref 0.7–4.0)
LYMPHS PCT: 9 %
MCH: 28.7 pg (ref 26.0–34.0)
MCHC: 32.5 g/dL (ref 30.0–36.0)
MCV: 88.4 fL (ref 78.0–100.0)
MONOS PCT: 7 %
Monocytes Absolute: 0.6 10*3/uL (ref 0.1–1.0)
NEUTROS PCT: 84 %
Neutro Abs: 7.1 10*3/uL (ref 1.7–7.7)
Platelets: 222 10*3/uL (ref 150–400)
RBC: 3.97 MIL/uL (ref 3.87–5.11)
RDW: 13.9 % (ref 11.5–15.5)
WBC: 8.5 10*3/uL (ref 4.0–10.5)

## 2016-03-18 LAB — TROPONIN I: Troponin I: <0.03 ng/mL (ref <0.03)

## 2016-03-18 LAB — BRAIN NATRIURETIC PEPTIDE: B Natriuretic Peptide: 66.7 pg/mL (ref 0.0–100.0)

## 2016-03-18 MED ORDER — BOOST PO LIQD
237.0000 mL | Freq: Three times a day (TID) | ORAL | Status: DC
  Administered 2016-03-18 – 2016-03-21 (×8): 237 mL via ORAL
  Filled 2016-03-18 (×13): qty 237

## 2016-03-18 MED ORDER — POTASSIUM CHLORIDE CRYS ER 20 MEQ PO TBCR
40.0000 meq | EXTENDED_RELEASE_TABLET | Freq: Once | ORAL | Status: AC
  Administered 2016-03-18: 40 meq via ORAL
  Filled 2016-03-18: qty 2

## 2016-03-18 MED ORDER — ALBUTEROL SULFATE (2.5 MG/3ML) 0.083% IN NEBU: 2.5000 mg | INHALATION_SOLUTION | RESPIRATORY_TRACT | Status: DC | PRN

## 2016-03-18 MED ORDER — SODIUM CHLORIDE 0.9% FLUSH
3.0000 mL | Freq: Two times a day (BID) | INTRAVENOUS | Status: DC
  Administered 2016-03-18 – 2016-03-21 (×5): 3 mL via INTRAVENOUS

## 2016-03-18 MED ORDER — ALLOPURINOL 300 MG PO TABS
300.0000 mg | ORAL_TABLET | ORAL | Status: DC
  Administered 2016-03-19 – 2016-03-21 (×3): 300 mg via ORAL
  Filled 2016-03-18 (×3): qty 1

## 2016-03-18 MED ORDER — ALBUTEROL SULFATE HFA 108 (90 BASE) MCG/ACT IN AERS: 2.0000 | INHALATION_SPRAY | RESPIRATORY_TRACT | Status: DC | PRN

## 2016-03-18 MED ORDER — SODIUM CHLORIDE 0.9 % IV SOLN
Freq: Once | INTRAVENOUS | Status: AC
  Administered 2016-03-18: 15:00:00 via INTRAVENOUS

## 2016-03-18 MED ORDER — ACETAMINOPHEN 650 MG RE SUPP: 650.0000 mg | Freq: Four times a day (QID) | RECTAL | Status: DC | PRN

## 2016-03-18 MED ORDER — ATORVASTATIN CALCIUM 20 MG PO TABS
20.0000 mg | ORAL_TABLET | Freq: Every day | ORAL | Status: DC
  Administered 2016-03-18 – 2016-03-20 (×3): 20 mg via ORAL
  Filled 2016-03-18 (×3): qty 1

## 2016-03-18 MED ORDER — FAMOTIDINE 20 MG PO TABS
20.0000 mg | ORAL_TABLET | Freq: Every day | ORAL | Status: DC
  Administered 2016-03-18 – 2016-03-20 (×3): 20 mg via ORAL
  Filled 2016-03-18 (×3): qty 1

## 2016-03-18 MED ORDER — SODIUM CHLORIDE 0.9 % IV BOLUS (SEPSIS)
500.0000 mL | Freq: Once | INTRAVENOUS | Status: AC
  Administered 2016-03-18: 500 mL via INTRAVENOUS

## 2016-03-18 MED ORDER — ACETAMINOPHEN 325 MG PO TABS: 650.0000 mg | ORAL_TABLET | Freq: Four times a day (QID) | ORAL | Status: DC | PRN

## 2016-03-18 MED ORDER — ONDANSETRON HCL 4 MG PO TABS: 4.0000 mg | ORAL_TABLET | Freq: Four times a day (QID) | ORAL | Status: DC | PRN

## 2016-03-18 MED ORDER — ONDANSETRON HCL 4 MG/2ML IJ SOLN: 4.0000 mg | Freq: Four times a day (QID) | INTRAMUSCULAR | Status: DC | PRN

## 2016-03-18 MED ORDER — HEPARIN SODIUM (PORCINE) 5000 UNIT/ML IJ SOLN
5000.0000 [IU] | Freq: Three times a day (TID) | INTRAMUSCULAR | Status: DC
  Administered 2016-03-18 – 2016-03-21 (×8): 5000 [IU] via SUBCUTANEOUS
  Filled 2016-03-18 (×8): qty 1

## 2016-03-18 MED ORDER — BUDESONIDE 0.25 MG/2ML IN SUSP
0.2500 mg | Freq: Two times a day (BID) | RESPIRATORY_TRACT | Status: DC
  Administered 2016-03-18 – 2016-03-21 (×6): 0.25 mg via RESPIRATORY_TRACT
  Filled 2016-03-18 (×6): qty 2

## 2016-03-18 NOTE — H&P (Signed)
TRH H&P   Patient Demographics:    [name], is a 82 y.o. female  MRN: [id number]   DOB - 08/08/1923  Admit Date - 03/18/2016  Outpatient Primary MD for the patient is Pearson Grippe, MD  Referring MD : Renne Crigler, PA  Outpatient Specialists: none  Patient coming from: Home  Chief Complaint  Patient presents with  . Weakness      HPI:    [name]  is a 82 y.o. female,With history of hypertension, COPD, arthritis, GERD who was seen in the ED about a month back with cough and weakness and sent to short-term rehabilitation. She has been discharged from there and staying with her son. Patient came to the ED feeling weak and dizzy for past few days. She reports that she has not been drinking enough water as she should be. Reports feeling weak. Denies headache, fevers, chills, nausea, vomiting, chest pain, shortness of breath, palpitations, abdominal pain, dysuria or diarrhea. Denies recent illness or travel.  Course in the ED Patient was orthostatic with blood work showing mild hypokalemia and acute kidney injury. Patient started on IV fluids and hospitalist admission requested for observation on telemetry.    Review of systems:    In addition to the HPI above,  No Fever-chills, No Headache, No changes with Vision or hearing, No problems swallowing food or Liquids, No Chest pain, Cough or Shortness of Breath, No Abdominal pain, No Nausea or Vommitting, Bowel movements are regular, No Blood in stool or Urine, No dysuria, No new skin rashes or bruises, No new joints pains-aches,  Generalized weakness, dizziness, left leg cramping No recent weight gain or loss, No polyuria, polydypsia or polyphagia, No significant Mental Stressors.  A full 10 point Review of Systems was done, except as stated above, all other Review of Systems were negative.   With Past  History of the following :    Past Medical History:  Diagnosis Date  . Arthritis   . Bronchitis   . COPD (chronic obstructive pulmonary disease) (HCC)   . GERD (gastroesophageal reflux disease)   . Gout   . H/O hiatal hernia   . Hypertension       Past Surgical History:  Procedure Laterality Date  . APPENDECTOMY    . TEE WITHOUT CARDIOVERSION N/A 10/13/2013   Procedure: TRANSESOPHAGEAL ECHOCARDIOGRAM (TEE);  Surgeon: Quintella Reichert, MD;  Location: [facility] ENDOSCOPY;  Service: Cardiovascular;  Laterality: N/A;      Social History:     Social History  Substance Use Topics  . Smoking status: Former Smoker    Packs/day: 0.50    Years: 70.00    Quit date: 08/18/2008  . Smokeless tobacco: Former Neurosurgeon    Quit date: 10/13/2004  . Alcohol use No     Lives - With her son  Mobility - Ambulatory without support  Family History :     Family History  Problem Relation Age of Onset  . Cancer Mother   . Stroke Father   . Cirrhosis Brother       Home Medications:   Prior to Admission medications   Medication Sig Start Date End Date Taking? Authorizing Provider  albuterol (PROVENTIL HFA;VENTOLIN HFA) 108 (90 BASE) MCG/ACT inhaler Inhale 2 puffs into the lungs every 4 (four) hours as needed for wheezing or shortness of breath. Patient taking differently: Inhale 2 puffs into the lungs every 4 (four) hours as needed for shortness of breath (wheezing and shortness of breath ((PLANB))).  11/28/14  Yes Azalia Bilis, MD  allopurinol (ZYLOPRIM) 300 MG tablet Take 300 mg by mouth every morning.   Yes Historical Provider, MD  amLODipine (NORVASC) 10 MG tablet Take 10 mg by mouth every evening.  06/07/14  Yes Historical Provider, MD  atenolol (TENORMIN) 25 MG tablet Take 25 mg by mouth daily.    Yes Historical Provider, MD  atorvastatin (LIPITOR) 20 MG tablet Take 20 mg by mouth at bedtime.   Yes Historical Provider, MD  budesonide (PULMICORT) 0.25 MG/2ML nebulizer solution Take 2 mLs (0.25  mg total) by nebulization 2 (two) times daily. Patient taking differently: Take 0.25 mg by nebulization 3 (three) times daily.  10/12/15  Yes Nyoka Cowden, MD  famotidine (PEPCID) 20 MG tablet Take 20 mg by mouth at bedtime. 10/19/15  Yes Historical Provider, MD  ibuprofen (ADVIL,MOTRIN) 200 MG tablet Take 200 mg by mouth every 6 (six) hours as needed for moderate pain.    Yes Historical Provider, MD  ipratropium-albuterol (DUONEB) 0.5-2.5 (3) MG/3ML SOLN Take 3 mLs by nebulization every 6 (six) hours as needed. Patient taking differently: Take 3 mLs by nebulization every 4 (four) hours as needed (shortness of breath/ wheezing).  11/28/14  Yes Azalia Bilis, MD  lactose free nutrition (BOOST) LIQD Take 237 mLs by mouth 3 (three) times daily between meals.   Yes Historical Provider, MD  losartan (COZAAR) 100 MG tablet Take 50 mg by mouth every morning.    Yes Historical Provider, MD  oxyCODONE-acetaminophen (PERCOCET/ROXICET) 5-325 MG tablet Take 1 tablet by mouth daily as needed for moderate pain.  12/24/15  Yes Historical Provider, MD  meclizine (ANTIVERT) 25 MG tablet Take 0.5 tablets (12.5 mg total) by mouth 3 (three) times daily as needed for dizziness. Patient not taking: Reported on 02/21/2016 01/05/16   Arby Barrette, MD  pantoprazole (PROTONIX) 40 MG tablet TAKE 1 TABLET BY MOUTH EVERY DAY 30-60 MINUTES BEFORE FIRST MEAL OF THE DAY Patient not taking: Reported on 02/21/2016 10/22/15   Nyoka Cowden, MD     Allergies:     Allergies  Allergen Reactions  . Penicillins Rash    Tolerated ceftriaxone Has patient had a PCN reaction causing immediate rash, facial/tongue/throat swelling, SOB or lightheadedness with hypotension: unknown Has patient had a PCN reaction causing severe rash involving mucus membranes or skin necrosis: unknown Has patient had a PCN reaction that required hospitalization: unknown Has patient had a PCN reaction occurring within the last 10 years: no if all of the above answers  are "NO", then may proceed with Cephalosporin use.      Physical Exam:   Vitals  Blood pressure 116/73, pulse 101, temperature 97.6 F (36.4 C), temperature source Oral, resp. rate 18, height 5' (1.524 m), weight 44.6 kg (98 lb 5 oz), SpO2 98 %.   Gen.: Elderly pleasant female not in distress HEENT: No  pallor, dry mucosa, supple neck Chest: Clear to auscultation bilaterally CVS: S1 and S2, no murmurs or gallop GI: Soft, nondistended, nontender, bowel sounds present Musculoskeletal: Warm, no edema CNS: Alert and oriented   Data Review:    CBC  Recent Labs Lab 03/18/16 1350  WBC 8.5  HGB 11.4*  HCT 35.1*  PLT 222  MCV 88.4  MCH 28.7  MCHC 32.5  RDW 13.9  LYMPHSABS 0.8  MONOABS 0.6  EOSABS 0.0  BASOSABS 0.0   ------------------------------------------------------------------------------------------------------------------  Chemistries   Recent Labs Lab 03/18/16 1350  NA 132*  K 3.1*  CL 98*  CO2 21*  GLUCOSE 139*  BUN 28*  CREATININE 1.61*  CALCIUM 9.4  AST 20  ALT 9*  ALKPHOS 74  BILITOT 0.5   ------------------------------------------------------------------------------------------------------------------ estimated creatinine clearance is 16 mL/min (by C-G formula based on SCr of 1.61 mg/dL). ------------------------------------------------------------------------------------------------------------------ No results for input(s): TSH, T4TOTAL, T3FREE, THYROIDAB in the last 72 hours.  Invalid input(s): FREET3  Coagulation profile No results for input(s): INR, PROTIME in the last 168 hours. ------------------------------------------------------------------------------------------------------------------- No results for input(s): DDIMER in the last 72 hours. -------------------------------------------------------------------------------------------------------------------  Cardiac Enzymes  Recent Labs Lab 03/18/16 1350  TROPONINI <0.03    ------------------------------------------------------------------------------------------------------------------    Component Value Date/Time   BNP 66.7 03/18/2016 1358     ---------------------------------------------------------------------------------------------------------------  Urinalysis    Component Value Date/Time   COLORURINE AMBER (A) 02/24/2016 0129   APPEARANCEUR CLEAR 02/24/2016 0129   LABSPEC 1.019 02/24/2016 0129   PHURINE 5.5 02/24/2016 0129   GLUCOSEU NEGATIVE 02/24/2016 0129   HGBUR NEGATIVE 02/24/2016 0129   BILIRUBINUR SMALL (A) 02/24/2016 0129   KETONESUR NEGATIVE 02/24/2016 0129   PROTEINUR NEGATIVE 02/24/2016 0129   UROBILINOGEN 0.2 02/21/2015 2337   NITRITE NEGATIVE 02/24/2016 0129   LEUKOCYTESUR SMALL (A) 02/24/2016 0129    ----------------------------------------------------------------------------------------------------------------   Imaging Results:    Dg Chest 2 View  Result Date: 03/18/2016 CLINICAL DATA:  Shortness of breath. EXAM: CHEST  2 VIEW COMPARISON:  Radiographs of February 23, 2016. FINDINGS: The heart size and mediastinal contours are within normal limits. Atherosclerosis of thoracic aorta is noted. No pneumothorax or pleural effusion is noted. Mild interstitial densities are noted in the perihilar and basilar regions which may represent edema. The visualized skeletal structures are unremarkable. IMPRESSION: Possible mild bilateral perihilar and basilar edema. Aortic atherosclerosis. Electronically Signed   By: Lupita Raider, M.D.   On: 03/18/2016 14:51    My personal review of EKG: Normal sinus rhythm, no ST-T changes   Assessment & Plan:    Principal Problem:   Orthostatic hypotension Possibly due to poor by mouth intake and dehydration. Hold home blood pressure medications. IV fluids started in the ED which was continued (NS@100cc / hr) PT evaluation. Recheck orthostasis in the morning.  Active Problems:  AKI (acute kidney  injury) (HCC) Likely due to dehydration. . Patient also aching ibuprofen for joint pain which has been discontinued. Hold blood pressure medications and hydrated.   Hypokalemia Patient complains having some leg cramps. Replenished.    Protein-calorie malnutrition, severe (HCC) Continue supplement.   Generalized weakness PT evaluation.  COPD stable. Continue home inhalers.    1.    DVT Prophylaxis Subcutaneous heparin  AM Labs Ordered, also please review Full Orders  Family Communication: None at bedside  Code Status full code  Likely DC to  Home with HH  Condition FAIR  Consults called: none  Admission status: observation  Time spent in minutes : 55   Shirlie Enck  M.D on 03/18/2016 at 5:12 PM  Between 7am to 7pm - Pager - [phone]. After 7pm go to [email or web address] - password [facility]  [facility] - Office  [phone]

## 2016-03-18 NOTE — ED Provider Notes (Signed)
[facility]-EMERGENCY DEPT Provider Note   CSN: [id number] Arrival date & time: 03/18/16  1318  First Provider Contact:  First MD Initiated Contact with Patient 03/18/16 1345        History   Chief Complaint Chief Complaint  Patient presents with  . Weakness    HPI [name] is a 82 y.o. female.  Patient with history of CHF (grade 1 diastolic dysfunction in 2015), COPD, GERD, high blood pressure, malnutrition -- presents with complaint of generalized weakness and dizziness that was worse this morning. She does not have any other complaints except for a slight occasional cough and just 'feeling sick'. She was transported to the emergency department by EMS. No reported fevers, URI symptoms, vomiting, diarrhea, urinary symptoms. No skin rashes. No chest pain or abdominal pain. Admits that she does not eat or drink well. The onset of this condition was acute. The course is constant. Aggravating factors: none. Alleviating factors: none.   Patient had a stay in the ED last month, seen by social work, eventually found placement in a rehabilitation facility. Patient has been discharged from there and is currently living in her apartment.      Past Medical History:  Diagnosis Date  . Arthritis   . Bronchitis   . COPD (chronic obstructive pulmonary disease) (HCC)   . GERD (gastroesophageal reflux disease)   . Gout   . H/O hiatal hernia   . Hypertension     Patient Active Problem List   Diagnosis Date Noted  . Postinflammatory pulmonary fibrosis (HCC) 04/29/2015  . Dyspnea 04/26/2015  . Severe malnutrition (HCC) 02/23/2015  . AKI (acute kidney injury) (HCC) 02/22/2015  . Arthritis 02/22/2015  . Weakness   . Dehydration 02/21/2015  . ARF (acute renal failure) (HCC) 10/03/2014  . Underweight 07/20/2014  . Acute bronchitis 07/20/2014  . COPD GOLD I with reversibility  07/18/2014  . Essential hypertension 07/18/2014  . Gout 07/18/2014  . SBO (small bowel obstruction) (HCC)  01/14/2014  . Hypercalcemia 01/14/2014  . Protein-calorie malnutrition, severe (HCC) 10/10/2013  . Acute respiratory failure (HCC) 10/07/2013  . Hypokalemia 10/07/2013  . H. influenzae septicemia (HCC) 12/06/2012  . Leukocytopenia, unspecified 12/03/2012  . HTN (hypertension) 12/02/2012  . Gouty arthritis 12/02/2012    Past Surgical History:  Procedure Laterality Date  . APPENDECTOMY    . TEE WITHOUT CARDIOVERSION N/A 10/13/2013   Procedure: TRANSESOPHAGEAL ECHOCARDIOGRAM (TEE);  Surgeon: Quintella Reichert, MD;  Location: [facility] ENDOSCOPY;  Service: Cardiovascular;  Laterality: N/A;    OB History    No data available       Home Medications    Prior to Admission medications   Medication Sig Start Date End Date Taking? Authorizing Provider  albuterol (PROVENTIL HFA;VENTOLIN HFA) 108 (90 BASE) MCG/ACT inhaler Inhale 2 puffs into the lungs every 4 (four) hours as needed for wheezing or shortness of breath. Patient taking differently: Inhale 2 puffs into the lungs every 4 (four) hours as needed for shortness of breath (wheezing and shortness of breath ((PLANB))).  11/28/14   Azalia Bilis, MD  allopurinol (ZYLOPRIM) 300 MG tablet Take 300 mg by mouth every morning.    Historical Provider, MD  amLODipine (NORVASC) 10 MG tablet Take 10 mg by mouth every evening.  06/07/14   Historical Provider, MD  atenolol (TENORMIN) 25 MG tablet Take 25 mg by mouth daily.     Historical Provider, MD  atorvastatin (LIPITOR) 20 MG tablet Take 20 mg by mouth at bedtime.    Historical  Provider, MD  budesonide (PULMICORT) 0.25 MG/2ML nebulizer solution Take 2 mLs (0.25 mg total) by nebulization 2 (two) times daily. Patient taking differently: Take 0.25 mg by nebulization 3 (three) times daily.  10/12/15   Nyoka Cowden, MD  chlorpheniramine-HYDROcodone (TUSSIONEX) 10-8 MG/5ML SUER Take 5 mLs by mouth every 12 (twelve) hours as needed. For cough 02/20/16   Historical Provider, MD  famotidine (PEPCID) 20 MG tablet Take  20 mg by mouth at bedtime. 10/19/15   Historical Provider, MD  ibuprofen (ADVIL,MOTRIN) 200 MG tablet Take 200 mg by mouth every 6 (six) hours as needed for moderate pain.     Historical Provider, MD  ipratropium-albuterol (DUONEB) 0.5-2.5 (3) MG/3ML SOLN Take 3 mLs by nebulization every 6 (six) hours as needed. Patient taking differently: Take 3 mLs by nebulization every 4 (four) hours as needed (shortness of breath/ wheezing).  11/28/14   Azalia Bilis, MD  lactose free nutrition (BOOST) LIQD Take 237 mLs by mouth 3 (three) times daily between meals.    Historical Provider, MD  losartan (COZAAR) 100 MG tablet Take 50 mg by mouth every morning.     Historical Provider, MD  meclizine (ANTIVERT) 25 MG tablet Take 0.5 tablets (12.5 mg total) by mouth 3 (three) times daily as needed for dizziness. Patient not taking: Reported on 02/21/2016 01/05/16   Arby Barrette, MD  oxyCODONE-acetaminophen (PERCOCET/ROXICET) 5-325 MG tablet Take 1 tablet by mouth daily as needed for moderate pain.  12/24/15   Historical Provider, MD  pantoprazole (PROTONIX) 40 MG tablet TAKE 1 TABLET BY MOUTH EVERY DAY 30-60 MINUTES BEFORE FIRST MEAL OF THE DAY Patient not taking: Reported on 02/21/2016 10/22/15   Nyoka Cowden, MD    Family History Family History  Problem Relation Age of Onset  . Cancer Mother   . Stroke Father   . Cirrhosis Brother     Social History Social History  Substance Use Topics  . Smoking status: Former Smoker    Packs/day: 0.50    Years: 70.00    Quit date: 08/18/2008  . Smokeless tobacco: Former Neurosurgeon    Quit date: 10/13/2004  . Alcohol use No     Allergies   Penicillins   Review of Systems Review of Systems  Constitutional: Positive for fatigue. Negative for fever.  HENT: Negative for rhinorrhea and sore throat.   Eyes: Negative for redness.  Respiratory: Negative for cough.   Cardiovascular: Negative for chest pain.  Gastrointestinal: Negative for abdominal pain, diarrhea, nausea and  vomiting.  Genitourinary: Negative for dysuria.  Musculoskeletal: Negative for myalgias.  Skin: Negative for rash.  Neurological: Positive for dizziness and weakness. Negative for headaches.     Physical Exam Updated Vital Signs BP 126/67   Pulse 87   Temp 97.6 F (36.4 C) (Oral)   Resp 22   Ht 5' (1.524 m)   Wt 44.6 kg   SpO2 100%   BMI 19.20 kg/m   Physical Exam  Constitutional: She appears well-developed. She appears cachectic.  HENT:  Head: Normocephalic and atraumatic.  Right Ear: External ear normal.  Left Ear: External ear normal.  Mouth/Throat: Mucous membranes are dry.  Eyes: Conjunctivae are normal. Right eye exhibits no discharge. Left eye exhibits no discharge.  Neck: Normal range of motion. Neck supple.  Cardiovascular: Normal rate and regular rhythm.  Exam reveals no friction rub.   Murmur heard. Pulmonary/Chest: Effort normal and breath sounds normal. No respiratory distress. She has no wheezes. She has no rales.  Occasional cough.  Abdominal: Soft. She exhibits no mass. There is no tenderness. There is no guarding.  Musculoskeletal: She exhibits no edema or tenderness.  Neurological: She is alert.  Skin: Skin is warm and dry.  Psychiatric: She has a normal mood and affect.  Nursing note and vitals reviewed.    ED Treatments / Results  Labs (all labs ordered are listed, but only abnormal results are displayed) Labs Reviewed  COMPREHENSIVE METABOLIC PANEL - Abnormal; Notable for the following:       Result Value   Sodium 132 (*)    Potassium 3.1 (*)    Chloride 98 (*)    CO2 21 (*)    Glucose, Bld 139 (*)    BUN 28 (*)    Creatinine, Ser 1.61 (*)    Total Protein 6.2 (*)    Albumin 3.2 (*)    ALT 9 (*)    GFR calc non Af Amer 27 (*)    GFR calc Af Amer 31 (*)    All other components within normal limits  CBC WITH DIFFERENTIAL/PLATELET - Abnormal; Notable for the following:    Hemoglobin 11.4 (*)    HCT 35.1 (*)    All other components  within normal limits  URINALYSIS, ROUTINE W REFLEX MICROSCOPIC (NOT AT [facility]) - Abnormal; Notable for the following:    APPearance CLOUDY (*)    Bilirubin Urine MODERATE (*)    Leukocytes, UA SMALL (*)    All other components within normal limits  URINE MICROSCOPIC-ADD ON - Abnormal; Notable for the following:    Squamous Epithelial / LPF 0-5 (*)    Bacteria, UA FEW (*)    Casts HYALINE CASTS (*)    All other components within normal limits  TROPONIN I  BRAIN NATRIURETIC PEPTIDE  BASIC METABOLIC PANEL    EKG  EKG Interpretation None       Radiology Dg Chest 2 View  Result Date: 03/18/2016 CLINICAL DATA:  Shortness of breath. EXAM: CHEST  2 VIEW COMPARISON:  Radiographs of February 23, 2016. FINDINGS: The heart size and mediastinal contours are within normal limits. Atherosclerosis of thoracic aorta is noted. No pneumothorax or pleural effusion is noted. Mild interstitial densities are noted in the perihilar and basilar regions which may represent edema. The visualized skeletal structures are unremarkable. IMPRESSION: Possible mild bilateral perihilar and basilar edema. Aortic atherosclerosis. Electronically Signed   By: Lupita Raider, M.D.   On: 03/18/2016 14:51    Procedures Procedures (including critical care time)  Medications Ordered in ED Medications  heparin injection 5,000 Units (not administered)  sodium chloride flush (NS) 0.9 % injection 3 mL (not administered)  acetaminophen (TYLENOL) tablet 650 mg (not administered)    Or  acetaminophen (TYLENOL) suppository 650 mg (not administered)  ondansetron (ZOFRAN) tablet 4 mg (not administered)    Or  ondansetron (ZOFRAN) injection 4 mg (not administered)  lactose free nutrition (Boost) liquid 237 mL (not administered)  famotidine (PEPCID) tablet 20 mg (not administered)  budesonide (PULMICORT) nebulizer solution 0.25 mg (not administered)  allopurinol (ZYLOPRIM) tablet 300 mg (not administered)  atorvastatin (LIPITOR)  tablet 20 mg (not administered)  albuterol (PROVENTIL) (2.5 MG/3ML) 0.083% nebulizer solution 2.5 mg (not administered)  sodium chloride 0.9 % bolus 500 mL (0 mLs Intravenous Stopped 03/18/16 1627)  0.9 %  sodium chloride infusion ( Intravenous Transfusing/Transfer 03/18/16 1736)  potassium chloride SA (K-DUR,KLOR-CON) CR tablet 40 mEq (40 mEq Oral Given 03/18/16 1654)     Initial Impression / Assessment and Plan /  ED Course  I have reviewed the triage vital signs and the nursing notes.  Pertinent labs & imaging results that were available during my care of the patient were reviewed by me and considered in my medical decision making (see chart for details).  Clinical Course   Patient seen and examined. Work-up initiated. Discussed with Dr. Erma Heritage who will see.    Vital signs reviewed and are as follows: BP 126/67   Pulse 87   Temp 97.6 F (36.4 C) (Oral)   Resp 22   Ht 5' (1.524 m)   Wt 44.6 kg   SpO2 100%   BMI 19.20 kg/m   4:25 PM Patient updated on results. Will admit to observation given AKI, orthostasis for gentle hydration. She is in agreement.   Spoke with Dr. Gonzella Lex who will see.   Final Clinical Impressions(s) / ED Diagnoses   Final diagnoses:  Acute kidney injury (HCC)  Dehydration  Hypokalemia    New Prescriptions New Prescriptions   No medications on file     Renne Crigler, PA-C 03/18/16 2012    Shaune Pollack, MD 03/19/16 [phone]

## 2016-03-18 NOTE — ED Notes (Signed)
Attempted report 

## 2016-03-18 NOTE — ED Notes (Signed)
Pt taken to xray 

## 2016-03-18 NOTE — ED Triage Notes (Signed)
Per EMS pt from home is a CHF patient called out for generalized weakness and lightheadedness worsening today. Patient endorses feeling "sick" and has had cold recently and does not feel herself. Patient endorses some swelling in abdomen and in BLL. No swelling in LE noted. Pt endorses dry cough.

## 2016-03-18 NOTE — Progress Notes (Signed)
Pt admitted to the unit at 6:20pm. Pt mental status is alert and oriented x 4. Pt oriented to room, staff, and call bell. Skin is intact. Full assessment charted in CHL. Call bell within reach. Visitor guidelines reviewed w/ pt and/or family.

## 2016-03-19 DIAGNOSIS — I951 Orthostatic hypotension: Secondary | ICD-10-CM

## 2016-03-19 LAB — BASIC METABOLIC PANEL
Anion gap: 10 (ref 5–15)
BUN: 20 mg/dL (ref 6–20)
CHLORIDE: 105 mmol/L (ref 101–111)
CO2: 21 mmol/L — AB (ref 22–32)
Calcium: 8.8 mg/dL — ABNORMAL LOW (ref 8.9–10.3)
Creatinine, Ser: 0.89 mg/dL (ref 0.44–1.00)
GFR calc Af Amer: >60 mL/min (ref >60)
GFR calc non Af Amer: 55 mL/min — ABNORMAL LOW (ref >60)
GLUCOSE: 114 mg/dL — AB (ref 65–99)
POTASSIUM: 3.6 mmol/L (ref 3.5–5.1)
SODIUM: 136 mmol/L (ref 135–145)

## 2016-03-19 MED ORDER — SODIUM CHLORIDE 0.9 % IV SOLN
INTRAVENOUS | Status: DC
  Administered 2016-03-19 – 2016-03-20 (×2): via INTRAVENOUS

## 2016-03-19 NOTE — Progress Notes (Signed)
PROGRESS NOTE    [name]  WVP:[id number] DOB: 01-08-1923 DOA: 03/18/2016 PCP: Pearson Grippe, MD    Brief Narrative:  [name]  is a 82 y.o. female,With history of hypertension, COPD, arthritis, GERD who was seen in the ED about a month back with cough and weakness and sent to short-term rehabilitation. She has been discharged from there and staying with her son. Patient came to the ED feeling weak and dizzy for past few days. She reports that she has not been drinking enough water as she should be. Reports feeling weak. Denies headache, fevers, chills, nausea, vomiting, chest pain, shortness of breath, palpitations, abdominal pain, dysuria or diarrhea. Denies recent illness or travel.  Course in the ED Patient was orthostatic with blood work showing mild hypokalemia and acute kidney injury. Patient started on IV fluids and hospitalist admission requested for observation on telemetry.   Assessment & Plan:   Principal Problem:   Orthostatic hypotension Active Problems:   Protein-calorie malnutrition, severe (HCC)   Essential hypertension   Dehydration   AKI (acute kidney injury) (HCC)   Weakness   Orthostatic dizziness  Orthostatic hypotension Possibly due to poor by mouth intake and dehydration. Hold home blood pressure medications. Continue with IV fluids.  Repeat orthostatic.    AKI (acute kidney injury) (HCC) Likely due to dehydration. . Patient also aching ibuprofen for joint pain which has been discontinued. Hold blood pressure medications and hydrated. improved with IV fluids./   Hypokalemia Patient complains having some leg cramps. Replenished.    Protein-calorie malnutrition, severe (HCC) Continue supplement.   Generalized weakness PT evaluation.  COPD stable. Continue home inhalers.     DVT prophylaxis: heparin  Code Status: full code.  Family Communication: care discussed with patient.  Disposition Plan: might benefit from SNF     Consultants:   none  Procedures:  none  Antimicrobials:   none   Subjective: She is feeling better, but still feels weak.  Would like to go to rehab to get stronger/  Denies dysuria.   Objective: Vitals:   03/18/16 2151 03/19/16 0608 03/19/16 0850 03/19/16 1327  BP:  113/71  111/60  Pulse: 86 79  85  Resp: 18 18    Temp:  98.2 F (36.8 C)  98.3 F (36.8 C)  TempSrc:  Oral  Oral  SpO2: 98% 96% 96% 95%  Weight:      Height:        Intake/Output Summary (Last 24 hours) at 03/19/16 1451 Last data filed at 03/19/16 1418  Gross per 24 hour  Intake              480 ml  Output             1125 ml  Net             -645 ml   Filed Weights   03/18/16 1330 03/18/16 1354  Weight: 46.7 kg (103 lb) 44.6 kg (98 lb 5 oz)    Examination:  General exam: Appears calm and comfortable  Respiratory system: Clear to auscultation. Respiratory effort normal. Cardiovascular system: S1 & S2 heard, RRR. No JVD, murmurs, rubs, gallops or clicks. No pedal edema. Gastrointestinal system: Abdomen is nondistended, soft and nontender. No organomegaly or masses felt. Normal bowel sounds heard. Central nervous system: Alert and oriented. No focal neurological deficits. Extremities: Symmetric 5 x 5 power. Skin: No rashes, lesions or ulcers Psychiatry: Judgement and insight appear normal. Mood & affect appropriate.  Data Reviewed: I have personally reviewed following labs and imaging studies  CBC:  Recent Labs Lab 03/18/16 1350  WBC 8.5  NEUTROABS 7.1  HGB 11.4*  HCT 35.1*  MCV 88.4  PLT 222   Basic Metabolic Panel:  Recent Labs Lab 03/18/16 1350 03/19/16 0519  NA 132* 136  K 3.1* 3.6  CL 98* 105  CO2 21* 21*  GLUCOSE 139* 114*  BUN 28* 20  CREATININE 1.61* 0.89  CALCIUM 9.4 8.8*   GFR: Estimated Creatinine Clearance: 29 mL/min (by C-G formula based on SCr of 0.89 mg/dL). Liver Function Tests:  Recent Labs Lab 03/18/16 1350  AST 20  ALT 9*  ALKPHOS 74   BILITOT 0.5  PROT 6.2*  ALBUMIN 3.2*   No results for input(s): LIPASE, AMYLASE in the last 168 hours. No results for input(s): AMMONIA in the last 168 hours. Coagulation Profile: No results for input(s): INR, PROTIME in the last 168 hours. Cardiac Enzymes:  Recent Labs Lab 03/18/16 1350  TROPONINI <0.03   BNP (last 3 results)  Recent Labs  04/26/15 1419  PROBNP 37.0   HbA1C: No results for input(s): HGBA1C in the last 72 hours. CBG: No results for input(s): GLUCAP in the last 168 hours. Lipid Profile: No results for input(s): CHOL, HDL, LDLCALC, TRIG, CHOLHDL, LDLDIRECT in the last 72 hours. Thyroid Function Tests: No results for input(s): TSH, T4TOTAL, FREET4, T3FREE, THYROIDAB in the last 72 hours. Anemia Panel: No results for input(s): VITAMINB12, FOLATE, FERRITIN, TIBC, IRON, RETICCTPCT in the last 72 hours. Sepsis Labs: No results for input(s): PROCALCITON, LATICACIDVEN in the last 168 hours.  No results found for this or any previous visit (from the past 240 hour(s)).       Radiology Studies: Dg Chest 2 View  Result Date: 03/18/2016 CLINICAL DATA:  Shortness of breath. EXAM: CHEST  2 VIEW COMPARISON:  Radiographs of February 23, 2016. FINDINGS: The heart size and mediastinal contours are within normal limits. Atherosclerosis of thoracic aorta is noted. No pneumothorax or pleural effusion is noted. Mild interstitial densities are noted in the perihilar and basilar regions which may represent edema. The visualized skeletal structures are unremarkable. IMPRESSION: Possible mild bilateral perihilar and basilar edema. Aortic atherosclerosis. Electronically Signed   By: Lupita Raider, M.D.   On: 03/18/2016 14:51        Scheduled Meds: . allopurinol  300 mg Oral BH-q7a  . atorvastatin  20 mg Oral QHS  . budesonide  0.25 mg Nebulization BID  . famotidine  20 mg Oral QHS  . heparin  5,000 Units Subcutaneous Q8H  . lactose free nutrition  237 mL Oral TID BM  .  sodium chloride flush  3 mL Intravenous Q12H   Continuous Infusions: . sodium chloride 75 mL/hr at 03/19/16 1227     LOS: 0 days    Time spent: 35    Alba Cory, MD [facility] Pager [phone]  If 7PM-7AM, please contact night-coverage [email or web address] Password [email or web address] 03/19/2016, 2:51 PM

## 2016-03-19 NOTE — Evaluation (Signed)
Physical Therapy Evaluation Patient Details Name: [name] MRN: [id number] DOB: 1923-01-19 Today's Date: 03/19/2016   History of Present Illness  [name]  is a 82 y.o. female,With history of hypertension, COPD, arthritis, GERD who was seen in the ED about a month back with cough and weakness and sent to short-term rehabilitation. She has been discharged from there and staying with her son. Patient came to the ED feeling weak and dizzy for past few days. She reports that she has not been drinking enough water as she should be.   Clinical Impression  Pt admitted with above diagnosis. Pt currently with functional limitations due to the deficits listed below (see PT Problem List). Pt with significant balance deficits with ambulation which improved but were not resolved with RW. Pt ambulated 150' and required min A for safety. Pt will benefit from skilled PT to increase their independence and safety with mobility to allow discharge to the venue listed below.      Follow Up Recommendations SNF;Supervision/Assistance - 24 hour    Equipment Recommendations  Rolling walker with 5" wheels    Recommendations for Other Services       Precautions / Restrictions Precautions Precautions: Fall Precaution Comments: pr reports multiple falls at home Restrictions Weight Bearing Restrictions: No      Mobility  Bed Mobility Overal bed mobility: Modified Independent             General bed mobility comments: no assistance needed for in and out of bed  Transfers Overall transfer level: Needs assistance Equipment used: None Transfers: Sit to/from Stand Sit to Stand: Min assist         General transfer comment: left lean, min A to prevent LOB  Ambulation/Gait Ambulation/Gait assistance: Min assist Ambulation Distance (Feet): 150 Feet Assistive device: Rolling walker (2 wheeled);None Gait Pattern/deviations: Step-through pattern;Staggering left Gait velocity: decreased    General Gait Details: pt began ambulation without AD with significant LOB to left. Pt then given RW with continued occasional LOB to left but self correction accomplished with use of RW. Pt also bumped into objects in hallway on left side.   Stairs            Wheelchair Mobility    Modified Rankin (Stroke Patients Only)       Balance Overall balance assessment: Needs assistance;History of Falls Sitting-balance support: No upper extremity supported;Feet supported Sitting balance-Leahy Scale: Good   Postural control: Left lateral lean Standing balance support: No upper extremity supported Standing balance-Leahy Scale: Fair Standing balance comment: pt can maintain static standing but is unable to accept any perturbation                             Pertinent Vitals/Pain Pain Assessment: No/denies pain    Home Living Family/patient expects to be discharged to:: Private residence Living Arrangements: Children Available Help at Discharge: Family;Available PRN/intermittently Type of Home: Apartment Home Access: Level entry     Home Layout: One level Home Equipment: Cane - single point Additional Comments: pt reports that she had a RW but doesn't now and thinks her grandson did something with it. He lives with her and is in and out of the home    Prior Function Level of Independence: Independent with assistive device(s)         Comments: pt reports she sometimes uses SPC     Hand Dominance   Dominant Hand: Right    Extremity/Trunk Assessment  Upper Extremity Assessment: Overall WFL for tasks assessed           Lower Extremity Assessment: Overall WFL for tasks assessed      Cervical / Trunk Assessment: Kyphotic  Communication   Communication: No difficulties  Cognition Arousal/Alertness: Awake/alert Behavior During Therapy: WFL for tasks assessed/performed Overall Cognitive Status: History of cognitive impairments - at baseline        Memory: Decreased short-term memory (poor historian)              General Comments General comments (skin integrity, edema, etc.): O2 sats 92% on RA after ambulation, 2/4 DOE    Exercises        Assessment/Plan    PT Assessment Patient needs continued PT services  PT Diagnosis Difficulty walking;Abnormality of gait   PT Problem List Decreased activity tolerance;Decreased balance;Decreased mobility;Decreased knowledge of use of DME;Decreased knowledge of precautions;Decreased cognition;Decreased safety awareness  PT Treatment Interventions DME instruction;Gait training;Functional mobility training;Therapeutic activities;Therapeutic exercise;Balance training;Patient/family education   PT Goals (Current goals can be found in the Care Plan section) Acute Rehab PT Goals Patient Stated Goal: pt says she will go home or to rehab PT Goal Formulation: With patient Time For Goal Achievement: 04/02/16 Potential to Achieve Goals: Good    Frequency Min 3X/week   Barriers to discharge Decreased caregiver support grandson not with her all of the time    Co-evaluation               End of Session Equipment Utilized During Treatment: Gait belt Activity Tolerance: Patient tolerated treatment well Patient left: in chair;with chair alarm set;with call bell/phone within reach Nurse Communication: Mobility status    Functional Assessment Tool Used: clinical judgement Functional Limitation: Mobility: Walking and moving around Mobility: Walking and Moving Around Current Status [phone]): At least 20 percent but less than 40 percent impaired, limited or restricted Mobility: Walking and Moving Around Goal Status [phone]): At least 1 percent but less than 20 percent impaired, limited or restricted    Time: 0981-1914 PT Time Calculation (min) (ACUTE ONLY): 23 min   Charges:   PT Evaluation $PT Eval Moderate Complexity: 1 Procedure PT Treatments $Gait Training: 23-37 mins   PT G  Codes:   PT G-Codes **NOT FOR INPATIENT CLASS** Functional Assessment Tool Used: clinical judgement Functional Limitation: Mobility: Walking and moving around Mobility: Walking and Moving Around Current Status (N8295): At least 20 percent but less than 40 percent impaired, limited or restricted Mobility: Walking and Moving Around Goal Status [phone]): At least 1 percent but less than 20 percent impaired, limited or restricted   Lyanne Co, PT  Acute Rehab Services  [phone]  Lyanne Co 03/19/2016, 2:44 PM

## 2016-03-19 NOTE — Clinical Social Work Note (Signed)
Clinical Social Work Assessment  Patient Details  Name: [name] MRN: [id number] Date of Birth: January 28, 1923  Date of referral:  03/19/16               Reason for consult:  Facility Placement                Permission sought to share information with:  Family Supports Permission granted to share information::  Yes, Verbal Permission Granted  Name::     Corporate treasurer::  SNFs  Relationship::  [employer] Information:  [phone]  Housing/Transportation Living arrangements for the past 2 months:  [place] of Information:  Patient Patient Interpreter Needed:  None Criminal Activity/Legal Involvement Pertinent to Current Situation/Hospitalization:  No - Comment as needed Significant Relationships:  Other Family Members Lives with:  Relatives (Nephew) Do you feel safe going back to the place where you live?  No Need for family participation in patient care:  No (Coment)  Care giving concerns:  CSW received referral for possible SNF placement at time of discharge. CSW met with patient at bedside regarding PT recommendation of SNF placement at time of discharge. Patient reports that her nephew lives with her but is not always there to help and she feels like she needs to go somewhere to get a little stronger.. Patient expressed understanding of PT recommendation and is agreeable to SNF placement at time of discharge. CSW to continue to follow and assist with discharge planning needs.  Social Worker assessment / plan:  CSW spoke with patient concerning possibility of rehab at [facility] before returning home.  Employment status:  Retired Nurse, adult PT Recommendations:  [place] / Referral to community resources:  [place]  Patient/Family's Response to care:  Patient recognizes need for rehab before returning home and is agreeable to a SNF in [place]. Patient reported preference for [facility] since she has been there before.  Patient/Family's Understanding of and Emotional Response to Diagnosis, Current Treatment, and Prognosis:  Patient/family is realistic regarding therapy needs and expressed being hopeful for SNF placement. No questions/concerns about plan or treatment.    Emotional Assessment Appearance:  Appears stated age Attitude/Demeanor/Rapport:  Other (Appropriate) Affect (typically observed):  Accepting, Appropriate, Pleasant Orientation:  Oriented to Place, Oriented to  Time, Oriented to Self, Oriented to Situation Alcohol / Substance use:  Not Applicable Psych involvement (Current and /or in the community):  No (Comment)  Discharge Needs  Concerns to be addressed:  Care Coordination Readmission within the last 30 days:  No Current discharge risk:  None Barriers to Discharge:  Continued Medical Work up   [employer], Latanya Presser 03/19/2016, 5:37 PM

## 2016-03-19 NOTE — Progress Notes (Signed)
Chaplain stopped by ans listened to stories and provided emotional care via prayer

## 2016-03-19 NOTE — NC FL2 (Signed)
[place] MEDICAID FL2 LEVEL OF CARE SCREENING TOOL     IDENTIFICATION  Patient Name: [name] Birthdate: 1923-02-16 Sex: female Admission Date (Current Location): 03/18/2016  [facility] and [place] Number:  Producer, television/film/video and Address:  [facility] [place], [address], [place], [place] [address]      Provider Number: [id number]  Attending Physician Name and Address:  Alba Cory, MD  Relative Name and Phone Number:  Jillyn Hidden nephew, [phone]    Current Level of Care: Hospital Recommended Level of Care: Skilled Nursing Facility Prior Approval Number:    Date Approved/Denied:   PASRR Number: [id number]  Discharge Plan: SNF    Current Diagnoses: Patient Active Problem List   Diagnosis Date Noted  . Orthostatic hypotension 03/18/2016  . Orthostatic dizziness 03/18/2016  . Postinflammatory pulmonary fibrosis (HCC) 04/29/2015  . Dyspnea 04/26/2015  . Severe malnutrition (HCC) 02/23/2015  . AKI (acute kidney injury) (HCC) 02/22/2015  . Arthritis 02/22/2015  . Weakness   . Dehydration 02/21/2015  . ARF (acute renal failure) (HCC) 10/03/2014  . Underweight 07/20/2014  . Acute bronchitis 07/20/2014  . COPD GOLD I with reversibility  07/18/2014  . Essential hypertension 07/18/2014  . Gout 07/18/2014  . SBO (small bowel obstruction) (HCC) 01/14/2014  . Hypercalcemia 01/14/2014  . Protein-calorie malnutrition, severe (HCC) 10/10/2013  . Acute respiratory failure (HCC) 10/07/2013  . Hypokalemia 10/07/2013  . H. influenzae septicemia (HCC) 12/06/2012  . Leukocytopenia, unspecified 12/03/2012  . HTN (hypertension) 12/02/2012  . Gouty arthritis 12/02/2012    Orientation RESPIRATION BLADDER Height & Weight     Self, Time, Situation, Place  Normal Continent Weight: 44.6 kg (98 lb 5 oz) Height:  5' (152.4 cm)  BEHAVIORAL SYMPTOMS/MOOD NEUROLOGICAL BOWEL NUTRITION STATUS      Continent Diet (Please see DC Summary)  AMBULATORY STATUS  COMMUNICATION OF NEEDS Skin   Limited Assist Verbally Normal                       Personal Care Assistance Level of Assistance  Bathing, Feeding, Dressing Bathing Assistance: Limited assistance Feeding assistance: Independent Dressing Assistance: Limited assistance     Functional Limitations Info             SPECIAL CARE FACTORS FREQUENCY  PT (By licensed PT)     PT Frequency: min 3x/week              Contractures      Additional Factors Info  Code Status, Allergies Code Status Info: Full Allergies Info: Penicillins           Current Medications (03/19/2016):  This is the current hospital active medication list Current Facility-Administered Medications  Medication Dose Route Frequency Provider Last Rate Last Dose  . 0.9 %  sodium chloride infusion   Intravenous Continuous Belkys A Regalado, MD 75 mL/hr at 03/19/16 1227    . acetaminophen (TYLENOL) tablet 650 mg  650 mg Oral Q6H PRN Nishant Dhungel, MD       Or  . acetaminophen (TYLENOL) suppository 650 mg  650 mg Rectal Q6H PRN Nishant Dhungel, MD      . albuterol (PROVENTIL) (2.5 MG/3ML) 0.083% nebulizer solution 2.5 mg  2.5 mg Nebulization Q4H PRN Nishant Dhungel, MD      . allopurinol (ZYLOPRIM) tablet 300 mg  300 mg Oral BH-q7a Nishant Dhungel, MD   300 mg at 03/19/16 0840  . atorvastatin (LIPITOR) tablet 20 mg  20 mg Oral QHS Nishant  Dhungel, MD   20 mg at 03/18/16 2208  . budesonide (PULMICORT) nebulizer solution 0.25 mg  0.25 mg Nebulization BID Nishant Dhungel, MD   0.25 mg at 03/19/16 0850  . famotidine (PEPCID) tablet 20 mg  20 mg Oral QHS Nishant Dhungel, MD   20 mg at 03/18/16 2208  . heparin injection 5,000 Units  5,000 Units Subcutaneous Q8H Nishant Dhungel, MD   5,000 Units at 03/19/16 1339  . lactose free nutrition (Boost) liquid 237 mL  237 mL Oral TID BM Nishant Dhungel, MD   237 mL at 03/19/16 1340  . ondansetron (ZOFRAN) tablet 4 mg  4 mg Oral Q6H PRN Nishant Dhungel, MD       Or  .  ondansetron (ZOFRAN) injection 4 mg  4 mg Intravenous Q6H PRN Nishant Dhungel, MD      . sodium chloride flush (NS) 0.9 % injection 3 mL  3 mL Intravenous Q12H Nishant Dhungel, MD   3 mL at 03/18/16 2200     Discharge Medications: Please see discharge summary for a list of discharge medications.  Relevant Imaging Results:  Relevant Lab Results:   Additional Information SSN: [id number], [place], [place]

## 2016-03-20 ENCOUNTER — Observation Stay (HOSPITAL_COMMUNITY): Payer: Commercial Managed Care - HMO

## 2016-03-20 DIAGNOSIS — Z823 Family history of stroke: Secondary | ICD-10-CM | POA: Diagnosis not present

## 2016-03-20 DIAGNOSIS — Z87891 Personal history of nicotine dependence: Secondary | ICD-10-CM | POA: Diagnosis not present

## 2016-03-20 DIAGNOSIS — E860 Dehydration: Secondary | ICD-10-CM | POA: Diagnosis present

## 2016-03-20 DIAGNOSIS — Z681 Body mass index (BMI) 19 or less, adult: Secondary | ICD-10-CM | POA: Diagnosis not present

## 2016-03-20 DIAGNOSIS — K219 Gastro-esophageal reflux disease without esophagitis: Secondary | ICD-10-CM | POA: Diagnosis present

## 2016-03-20 DIAGNOSIS — I10 Essential (primary) hypertension: Secondary | ICD-10-CM | POA: Diagnosis present

## 2016-03-20 DIAGNOSIS — R531 Weakness: Secondary | ICD-10-CM | POA: Diagnosis present

## 2016-03-20 DIAGNOSIS — M109 Gout, unspecified: Secondary | ICD-10-CM | POA: Diagnosis present

## 2016-03-20 DIAGNOSIS — J40 Bronchitis, not specified as acute or chronic: Secondary | ICD-10-CM | POA: Diagnosis present

## 2016-03-20 DIAGNOSIS — E43 Unspecified severe protein-calorie malnutrition: Secondary | ICD-10-CM | POA: Diagnosis present

## 2016-03-20 DIAGNOSIS — J440 Chronic obstructive pulmonary disease with acute lower respiratory infection: Secondary | ICD-10-CM | POA: Diagnosis present

## 2016-03-20 DIAGNOSIS — N179 Acute kidney failure, unspecified: Secondary | ICD-10-CM | POA: Diagnosis present

## 2016-03-20 DIAGNOSIS — E876 Hypokalemia: Secondary | ICD-10-CM | POA: Diagnosis present

## 2016-03-20 DIAGNOSIS — I951 Orthostatic hypotension: Secondary | ICD-10-CM | POA: Diagnosis present

## 2016-03-20 DIAGNOSIS — R05 Cough: Secondary | ICD-10-CM | POA: Diagnosis not present

## 2016-03-20 LAB — BASIC METABOLIC PANEL
ANION GAP: 7 (ref 5–15)
BUN: 11 mg/dL (ref 6–20)
CALCIUM: 9.2 mg/dL (ref 8.9–10.3)
CO2: 25 mmol/L (ref 22–32)
CREATININE: 0.7 mg/dL (ref 0.44–1.00)
Chloride: 107 mmol/L (ref 101–111)
GFR calc Af Amer: >60 mL/min (ref >60)
GLUCOSE: 93 mg/dL (ref 65–99)
Potassium: 3.8 mmol/L (ref 3.5–5.1)
Sodium: 139 mmol/L (ref 135–145)

## 2016-03-20 LAB — BRAIN NATRIURETIC PEPTIDE: B Natriuretic Peptide: 109.6 pg/mL — ABNORMAL HIGH (ref 0.0–100.0)

## 2016-03-20 MED ORDER — METHYLPREDNISOLONE SODIUM SUCC 125 MG IJ SOLR
60.0000 mg | Freq: Two times a day (BID) | INTRAMUSCULAR | Status: DC
  Administered 2016-03-20 (×2): 60 mg via INTRAVENOUS
  Filled 2016-03-20 (×2): qty 2

## 2016-03-20 MED ORDER — OXYCODONE-ACETAMINOPHEN 5-325 MG PO TABS
1.0000 | ORAL_TABLET | Freq: Every day | ORAL | Status: DC | PRN
  Administered 2016-03-20: 1 via ORAL
  Filled 2016-03-20 (×2): qty 1

## 2016-03-20 MED ORDER — ALBUTEROL SULFATE (2.5 MG/3ML) 0.083% IN NEBU
2.5000 mg | INHALATION_SOLUTION | Freq: Four times a day (QID) | RESPIRATORY_TRACT | Status: DC
  Administered 2016-03-20 (×2): 2.5 mg via RESPIRATORY_TRACT
  Filled 2016-03-20 (×2): qty 3

## 2016-03-20 MED ORDER — FUROSEMIDE 10 MG/ML IJ SOLN
20.0000 mg | Freq: Once | INTRAMUSCULAR | Status: AC
Start: 2016-03-20 — End: 2016-03-20
  Administered 2016-03-20: 20 mg via INTRAVENOUS
  Filled 2016-03-20: qty 2

## 2016-03-20 NOTE — Progress Notes (Signed)
Per MD plan for DC to SNF tomorrow if pt is stable- facility updated  Ethlyn Gallery received for transfer to SNF tomorrow- # [phone]  CSW will continue to follow  Burna Sis, [facility] Clinical Social Worker [phone]

## 2016-03-20 NOTE — Progress Notes (Signed)
PROGRESS NOTE    [name]  ZOX:[id number] DOB: 1922/09/18 DOA: 03/18/2016 PCP: Pearson Grippe, MD    Brief Narrative:  [name]  is a 82 y.o. female,With history of hypertension, COPD, arthritis, GERD who was seen in the ED about a month back with cough and weakness and sent to short-term rehabilitation. She has been discharged from there and staying with her son. Patient came to the ED feeling weak and dizzy for past few days. She reports that she has not been drinking enough water as she should be. Reports feeling weak. Denies headache, fevers, chills, nausea, vomiting, chest pain, shortness of breath, palpitations, abdominal pain, dysuria or diarrhea. Denies recent illness or travel.  Course in the ED Patient was orthostatic with blood work showing mild hypokalemia and acute kidney injury. Patient started on IV fluids and hospitalist admission requested for observation on telemetry.   Assessment & Plan:   Principal Problem:   Orthostatic hypotension Active Problems:   Protein-calorie malnutrition, severe (HCC)   Essential hypertension   Dehydration   AKI (acute kidney injury) (HCC)   Weakness   Orthostatic dizziness  Orthostatic hypotension Possibly due to poor by mouth intake and dehydration. Hold home blood pressure medications. Resolved with fluids.   Dyspnea; wheezing;  Check chest x ray.  Continue with home Pulmicort.  Schedule nebulizer.  Chest x ray and lasix as needed.  IV solumedrol.    AKI (acute kidney injury) (HCC) Likely due to dehydration. . Patient also aching ibuprofen for joint pain which has been discontinued. Hold blood pressure medications and hydrated. improved with IV fluids./ NSL  Hypokalemia Patient complains having some leg cramps. Replenished.    Protein-calorie malnutrition, severe (HCC) Continue supplement.   Generalized weakness PT evaluation.  COPD stable. Continue home inhalers.     DVT prophylaxis: heparin    Code Status: full code.  Family Communication: care discussed with patient.  Disposition Plan: might benefit from SNF    Consultants:   none  Procedures:  none  Antimicrobials:   none   Subjective: Report dyspnea, wheezing , cough   Objective: Vitals:   03/19/16 2130 03/19/16 2208 03/20/16 0630 03/20/16 1142  BP:   (!) 146/72   Pulse:   76   Resp:  (!) 24    Temp:  98 F (36.7 C) 97.7 F (36.5 C)   TempSrc:  Oral Oral   SpO2: 96% 98% 98% 97%  Weight:      Height:        Intake/Output Summary (Last 24 hours) at 03/20/16 1324 Last data filed at 03/20/16 0815  Gross per 24 hour  Intake             1477 ml  Output              700 ml  Net              777 ml   Filed Weights   03/18/16 1330 03/18/16 1354  Weight: 46.7 kg (103 lb) 44.6 kg (98 lb 5 oz)    Examination:  General exam: Appears calm and comfortable  Respiratory system: bilateral wheezing  Cardiovascular system: S1 & S2 heard, RRR. No JVD, murmurs, rubs, gallops or clicks. No pedal edema. Gastrointestinal system: Abdomen is nondistended, soft and nontender. No organomegaly or masses felt. Normal bowel sounds heard. Central nervous system: Alert and oriented. No focal neurological deficits. Extremities: Symmetric 5 x 5 power. Skin: No rashes, lesions or ulcers Psychiatry: Judgement and insight appear  normal. Mood & affect appropriate.     Data Reviewed: I have personally reviewed following labs and imaging studies  CBC:  Recent Labs Lab 03/18/16 1350  WBC 8.5  NEUTROABS 7.1  HGB 11.4*  HCT 35.1*  MCV 88.4  PLT 222   Basic Metabolic Panel:  Recent Labs Lab 03/18/16 1350 03/19/16 0519 03/20/16 0443  NA 132* 136 139  K 3.1* 3.6 3.8  CL 98* 105 107  CO2 21* 21* 25  GLUCOSE 139* 114* 93  BUN 28* 20 11  CREATININE 1.61* 0.89 0.70  CALCIUM 9.4 8.8* 9.2   GFR: Estimated Creatinine Clearance: 32.2 mL/min (by C-G formula based on SCr of 0.8 mg/dL). Liver Function  Tests:  Recent Labs Lab 03/18/16 1350  AST 20  ALT 9*  ALKPHOS 74  BILITOT 0.5  PROT 6.2*  ALBUMIN 3.2*   No results for input(s): LIPASE, AMYLASE in the last 168 hours. No results for input(s): AMMONIA in the last 168 hours. Coagulation Profile: No results for input(s): INR, PROTIME in the last 168 hours. Cardiac Enzymes:  Recent Labs Lab 03/18/16 1350  TROPONINI <0.03   BNP (last 3 results)  Recent Labs  04/26/15 1419  PROBNP 37.0   HbA1C: No results for input(s): HGBA1C in the last 72 hours. CBG: No results for input(s): GLUCAP in the last 168 hours. Lipid Profile: No results for input(s): CHOL, HDL, LDLCALC, TRIG, CHOLHDL, LDLDIRECT in the last 72 hours. Thyroid Function Tests: No results for input(s): TSH, T4TOTAL, FREET4, T3FREE, THYROIDAB in the last 72 hours. Anemia Panel: No results for input(s): VITAMINB12, FOLATE, FERRITIN, TIBC, IRON, RETICCTPCT in the last 72 hours. Sepsis Labs: No results for input(s): PROCALCITON, LATICACIDVEN in the last 168 hours.  No results found for this or any previous visit (from the past 240 hour(s)).       Radiology Studies: Dg Chest 2 View  Result Date: 03/18/2016 CLINICAL DATA:  Shortness of breath. EXAM: CHEST  2 VIEW COMPARISON:  Radiographs of February 23, 2016. FINDINGS: The heart size and mediastinal contours are within normal limits. Atherosclerosis of thoracic aorta is noted. No pneumothorax or pleural effusion is noted. Mild interstitial densities are noted in the perihilar and basilar regions which may represent edema. The visualized skeletal structures are unremarkable. IMPRESSION: Possible mild bilateral perihilar and basilar edema. Aortic atherosclerosis. Electronically Signed   By: Lupita Raider, M.D.   On: 03/18/2016 14:51   Dg Chest Port 1 View  Result Date: 03/20/2016 CLINICAL DATA:  82 year old female with shortness breath and wheezing with productive cough. Subsequent encounter. EXAM: PORTABLE CHEST 1 VIEW  COMPARISON:  03/18/2016. FINDINGS: Consolidation retrocardiac region may represent atelectasis or infiltrate. Pulmonary vascular congestion superimposed upon chronic changes. Heart size top-normal. Calcified aorta. IMPRESSION: Consolidation retrocardiac region may represent atelectasis or infiltrate. Pulmonary vascular congestion superimposed upon chronic changes. Aortic atherosclerosis. Electronically Signed   By: Lacy Duverney M.D.   On: 03/20/2016 12:15        Scheduled Meds: . albuterol  2.5 mg Nebulization Q6H  . allopurinol  300 mg Oral BH-q7a  . atorvastatin  20 mg Oral QHS  . budesonide  0.25 mg Nebulization BID  . famotidine  20 mg Oral QHS  . furosemide  20 mg Intravenous Once  . heparin  5,000 Units Subcutaneous Q8H  . lactose free nutrition  237 mL Oral TID BM  . methylPREDNISolone (SOLU-MEDROL) injection  60 mg Intravenous Q12H  . sodium chloride flush  3 mL Intravenous Q12H  Continuous Infusions:     LOS: 0 days    Time spent: 35    Alba Cory, MD [facility] Pager [phone]  If 7PM-7AM, please contact night-coverage [email or web address] Password [email or web address] 03/20/2016, 1:24 PM

## 2016-03-20 NOTE — Consult Note (Signed)
   [facility] CM Inpatient Consult   03/20/2016  MILADA DESFORGES April 07, 1923 [id number]  Patient screened for potential [facility] Care Management services. Per MD notes in medical record the patient is  a 82 y.o.female,with history of hypertension, COPD, arthritis, GERD who was seen in the ED about a month back with cough and weakness and sent to short-term rehabilitation. She has been discharged from there and staying with her nephew [per Child psychotherapist note].  Patient came to the ED feeling weak and dizzy for past few days. She reports that she has not been drinking enough water as she should be. Reports feeling weak. Patient has had 6 ED visits in the past 6 months.  Spoke with inpatient RNCM regarding patient's possible community needs.  Patient's current plan is for a skilled nursing facility stay for short term rehab.   Patient is eligible for [facility] Care Management services under patient's [facility] plan.   Please place a [facility] Care Management consult  If the patient's discharge plan changes or for questions contact:   Charlesetta Shanks, RN BSN CCM [facility]  [phone] business mobile phone Toll free office [phone]

## 2016-03-21 DIAGNOSIS — J449 Chronic obstructive pulmonary disease, unspecified: Secondary | ICD-10-CM | POA: Diagnosis not present

## 2016-03-21 DIAGNOSIS — S37009D Unspecified injury of unspecified kidney, subsequent encounter: Secondary | ICD-10-CM | POA: Diagnosis not present

## 2016-03-21 DIAGNOSIS — R279 Unspecified lack of coordination: Secondary | ICD-10-CM | POA: Diagnosis not present

## 2016-03-21 DIAGNOSIS — I10 Essential (primary) hypertension: Secondary | ICD-10-CM | POA: Diagnosis not present

## 2016-03-21 DIAGNOSIS — I951 Orthostatic hypotension: Secondary | ICD-10-CM | POA: Diagnosis not present

## 2016-03-21 DIAGNOSIS — R531 Weakness: Secondary | ICD-10-CM | POA: Diagnosis not present

## 2016-03-21 DIAGNOSIS — N179 Acute kidney failure, unspecified: Secondary | ICD-10-CM | POA: Diagnosis not present

## 2016-03-21 DIAGNOSIS — E43 Unspecified severe protein-calorie malnutrition: Secondary | ICD-10-CM | POA: Diagnosis not present

## 2016-03-21 DIAGNOSIS — R41841 Cognitive communication deficit: Secondary | ICD-10-CM | POA: Diagnosis not present

## 2016-03-21 DIAGNOSIS — M6281 Muscle weakness (generalized): Secondary | ICD-10-CM | POA: Diagnosis not present

## 2016-03-21 DIAGNOSIS — R4182 Altered mental status, unspecified: Secondary | ICD-10-CM | POA: Diagnosis not present

## 2016-03-21 DIAGNOSIS — E860 Dehydration: Secondary | ICD-10-CM | POA: Diagnosis not present

## 2016-03-21 LAB — BASIC METABOLIC PANEL
ANION GAP: 11 (ref 5–15)
BUN: 12 mg/dL (ref 6–20)
CHLORIDE: 104 mmol/L (ref 101–111)
CO2: 22 mmol/L (ref 22–32)
Calcium: 9.7 mg/dL (ref 8.9–10.3)
Creatinine, Ser: 0.8 mg/dL (ref 0.44–1.00)
GFR calc non Af Amer: >60 mL/min (ref >60)
Glucose, Bld: 169 mg/dL — ABNORMAL HIGH (ref 65–99)
POTASSIUM: 3.9 mmol/L (ref 3.5–5.1)
SODIUM: 137 mmol/L (ref 135–145)

## 2016-03-21 MED ORDER — PREDNISONE 20 MG PO TABS: 40.0000 mg | ORAL_TABLET | Freq: Every day | ORAL | 0 refills | Status: DC

## 2016-03-21 MED ORDER — ACETAMINOPHEN 325 MG PO TABS: 650.0000 mg | ORAL_TABLET | Freq: Four times a day (QID) | ORAL | 0 refills | Status: DC | PRN

## 2016-03-21 MED ORDER — ALBUTEROL SULFATE (2.5 MG/3ML) 0.083% IN NEBU
2.5000 mg | INHALATION_SOLUTION | Freq: Two times a day (BID) | RESPIRATORY_TRACT | Status: DC
  Administered 2016-03-21: 2.5 mg via RESPIRATORY_TRACT
  Filled 2016-03-21: qty 3

## 2016-03-21 MED ORDER — PREDNISONE 20 MG PO TABS
40.0000 mg | ORAL_TABLET | Freq: Every day | ORAL | Status: DC
  Administered 2016-03-21: 40 mg via ORAL
  Filled 2016-03-21: qty 2

## 2016-03-21 MED ORDER — LEVOFLOXACIN 500 MG PO TABS: 500.0000 mg | ORAL_TABLET | Freq: Every day | ORAL | 0 refills | Status: DC

## 2016-03-21 MED ORDER — LEVOFLOXACIN 500 MG PO TABS
500.0000 mg | ORAL_TABLET | Freq: Every day | ORAL | Status: DC
  Administered 2016-03-21: 500 mg via ORAL
  Filled 2016-03-21: qty 1

## 2016-03-21 NOTE — Discharge Summary (Signed)
Physician Discharge Summary  [name] ZOX:[id number] DOB: 11-Oct-1922 DOA: 03/18/2016  PCP: Lindsay Grippe, MD  Admit date: 03/18/2016 Discharge date: 03/21/2016  Admitted From: home  Disposition:  SNF  Recommendations for Outpatient Follow-up:  1. Follow up with PCP in 1-2 weeks 2. Please obtain BMP/CBC in one week    Discharge Condition: stable.  CODE STATUS: Full code.  Diet recommendation: Heart Healthy   Brief/Interim Summary: Brief Narrative: [name] a 82 y.o.female,With history of hypertension, COPD, arthritis, GERD who was seen in the ED about a month back with cough and weakness and sent to short-term rehabilitation. She has been discharged from there and staying with her son. Patient came to the ED feeling weak and dizzy for past few days. She reports that she has not been drinking enough water as she should be. Reports feeling weak. Denies headache, fevers, chills, nausea, vomiting, chest pain, shortness of breath, palpitations, abdominal pain, dysuria or diarrhea. Denies recent illness or travel.  Course in the ED Patient was orthostatic with blood work showing mild hypokalemia and acute kidney injury. Patient started on IV fluids and hospitalist admission requested for observation on telemetry.  Orthostatic hypotension Possibly due to poor by mouth intake and dehydration. Hold home blood pressure medications. Resolved with fluids.  Might be able to resume atenolol if BP continue to increase.  Hold Norvasc, cozaar.   Dyspnea; wheezing; Bronchitis flare,  Chest x ray with pulmonary congestion, atelectasis vs infiltrate.  Continue with home Pulmicort.  Schedule nebulizer.  received one dose lasix.  IV solumedrol change to prednisone.  Discharge on levaqui.  Improved.    AKI (acute kidney injury) (HCC) Likely due to dehydration. . Patient also aching ibuprofen for joint pain which has been discontinued. Hold blood pressure medications and  hydrated. improved with IV fluids./ NSL  Hypokalemia Patient complains having some leg cramps. Replenished.  Protein-calorie malnutrition, severe (HCC) Continue supplement.   Generalized weakness PT evaluation.  COPD stable. Continue home inhalers.  Discharge Diagnoses:  Principal Problem:   Orthostatic hypotension Active Problems:   Protein-calorie malnutrition, severe (HCC)   Essential hypertension   Dehydration   AKI (acute kidney injury) (HCC)   Weakness   Orthostatic dizziness    Discharge Instructions  Discharge Instructions    Diet - low sodium heart healthy    Complete by:  As directed   Increase activity slowly    Complete by:  As directed       Medication List    STOP taking these medications   amLODipine 10 MG tablet Commonly known as:  NORVASC   ibuprofen 200 MG tablet Commonly known as:  ADVIL,MOTRIN   losartan 100 MG tablet Commonly known as:  COZAAR   oxyCODONE-acetaminophen 5-325 MG tablet Commonly known as:  PERCOCET/ROXICET     TAKE these medications   acetaminophen 325 MG tablet Commonly known as:  TYLENOL Take 2 tablets (650 mg total) by mouth every 6 (six) hours as needed for mild pain (or Fever >/= 101).   albuterol 108 (90 Base) MCG/ACT inhaler Commonly known as:  PROVENTIL HFA;VENTOLIN HFA Inhale 2 puffs into the lungs every 4 (four) hours as needed for wheezing or shortness of breath. What changed:  reasons to take this   allopurinol 300 MG tablet Commonly known as:  ZYLOPRIM Take 300 mg by mouth every morning.   atenolol 25 MG tablet Commonly known as:  TENORMIN Take 25 mg by mouth daily.   atorvastatin 20 MG tablet Commonly known as:  LIPITOR  Take 20 mg by mouth at bedtime.   budesonide 0.25 MG/2ML nebulizer solution Commonly known as:  PULMICORT Take 2 mLs (0.25 mg total) by nebulization 2 (two) times daily. What changed:  when to take this   famotidine 20 MG tablet Commonly known as:  PEPCID Take 20  mg by mouth at bedtime.   ipratropium-albuterol 0.5-2.5 (3) MG/3ML Soln Commonly known as:  DUONEB Take 3 mLs by nebulization every 6 (six) hours as needed. What changed:  when to take this  reasons to take this   lactose free nutrition Liqd Take 237 mLs by mouth 3 (three) times daily between meals.   levofloxacin 500 MG tablet Commonly known as:  LEVAQUIN Take 1 tablet (500 mg total) by mouth daily.   meclizine 25 MG tablet Commonly known as:  ANTIVERT Take 0.5 tablets (12.5 mg total) by mouth 3 (three) times daily as needed for dizziness.   pantoprazole 40 MG tablet Commonly known as:  PROTONIX TAKE 1 TABLET BY MOUTH EVERY DAY 30-60 MINUTES BEFORE FIRST MEAL OF THE DAY   predniSONE 20 MG tablet Commonly known as:  DELTASONE Take 2 tablets (40 mg total) by mouth daily with breakfast.       Allergies  Allergen Reactions  . Penicillins Rash        Consultations: none  Procedures/Studies: Dg Chest 2 View  Result Date: 03/18/2016 CLINICAL DATA:  Shortness of breath. EXAM: CHEST  2 VIEW COMPARISON:  Radiographs of February 23, 2016. FINDINGS: The heart size and mediastinal contours are within normal limits. Atherosclerosis of thoracic aorta is noted. No pneumothorax or pleural effusion is noted. Mild interstitial densities are noted in the perihilar and basilar regions which may represent edema. The visualized skeletal structures are unremarkable. IMPRESSION: Possible mild bilateral perihilar and basilar edema. Aortic atherosclerosis. Electronically Signed   By: Lupita Raider, M.D.   On: 03/18/2016 14:51   Dg Chest 2 View  Result Date: 02/23/2016 CLINICAL DATA:  Shortness of breath EXAM: CHEST  2 VIEW COMPARISON:  02/21/2016 FINDINGS: Mild eventration of left hemidiaphragm with associated mild left basilar opacity, likely atelectasis. No pleural effusion or pneumothorax. The heart is normal in size. Visualized osseous structures are within normal limits. IMPRESSION: Mild left  basilar opacity, likely atelectasis. Electronically Signed   By: Charline Bills M.D.   On: 02/23/2016 21:08   Dg Chest 2 View  Result Date: 02/21/2016 CLINICAL DATA:  Shortness of breath beginning today, progressively worse, wheezing, cough with yellow phlegm for past 3 days, COPD, hypertension, anemia, GERD EXAM: CHEST  2 VIEW COMPARISON:  02/18/2016 FINDINGS: Upper normal size of cardiac silhouette. Atherosclerotic calcification aorta. Mediastinal contours and pulmonary vascularity normal. Emphysematous and bronchitic changes consistent with COPD. New LEFT lower lobe opacity question atelectasis versus consolidation. Remaining lungs grossly clear. No pleural effusion or pneumothorax. Bones demineralized with thoracolumbar scoliosis. IMPRESSION: COPD changes with new atelectasis versus consolidation in LEFT lower lobe. Aortic atherosclerosis. Electronically Signed   By: Ulyses Southward M.D.   On: 02/21/2016 17:39   Dg Chest Port 1 View  Result Date: 03/20/2016 CLINICAL DATA:  82 year old female with shortness breath and wheezing with productive cough. Subsequent encounter. EXAM: PORTABLE CHEST 1 VIEW COMPARISON:  03/18/2016. FINDINGS: Consolidation retrocardiac region may represent atelectasis or infiltrate. Pulmonary vascular congestion superimposed upon chronic changes. Heart size top-normal. Calcified aorta. IMPRESSION: Consolidation retrocardiac region may represent atelectasis or infiltrate. Pulmonary vascular congestion superimposed upon chronic changes. Aortic atherosclerosis. Electronically Signed   By: Ernie Hew.D.  On: 03/20/2016 12:15       Subjective: She is feeling better, breathing better, feels some what confuse.   Discharge Exam: Vitals:   03/20/16 2109 03/21/16 0607  BP: 129/77 (!) 147/85  Pulse: 90 87  Resp: 18 18  Temp: 97.6 F (36.4 C) 97.9 F (36.6 C)   Vitals:   03/20/16 1550 03/20/16 2109 03/21/16 0607 03/21/16 0910  BP: 125/69 129/77 (!) 147/85   Pulse: 87  90 87   Resp: 20 18 18    Temp: 97.9 F (36.6 C) 97.6 F (36.4 C) 97.9 F (36.6 C)   TempSrc: Oral Oral Oral   SpO2: 97% 100% 97% 98%  Weight:      Height:        General: Pt is alert, awake, not in acute distress Cardiovascular: RRR, S1/S2 +, no rubs, no gallops Respiratory: CTA bilaterally, no wheezing, no rhonchi Abdominal: Soft, NT, ND, bowel sounds + Extremities: no edema, no cyanosis    The results of significant diagnostics from this hospitalization (including imaging, microbiology, ancillary and laboratory) are listed below for reference.     Microbiology: No results found for this or any previous visit (from the past 240 hour(s)).   Labs: BNP (last 3 results)  Recent Labs  03/18/16 1358 03/20/16 1028  BNP 66.7 109.6*   Basic Metabolic Panel:  Recent Labs Lab 03/18/16 1350 03/19/16 0519 03/20/16 0443 03/21/16 0439  NA 132* 136 139 137  K 3.1* 3.6 3.8 3.9  CL 98* 105 107 104  CO2 21* 21* 25 22  GLUCOSE 139* 114* 93 169*  BUN 28* 20 11 12   CREATININE 1.61* 0.89 0.70 0.80  CALCIUM 9.4 8.8* 9.2 9.7   Liver Function Tests:  Recent Labs Lab 03/18/16 1350  AST 20  ALT 9*  ALKPHOS 74  BILITOT 0.5  PROT 6.2*  ALBUMIN 3.2*   No results for input(s): LIPASE, AMYLASE in the last 168 hours. No results for input(s): AMMONIA in the last 168 hours. CBC:  Recent Labs Lab 03/18/16 1350  WBC 8.5  NEUTROABS 7.1  HGB 11.4*  HCT 35.1*  MCV 88.4  PLT 222   Cardiac Enzymes:  Recent Labs Lab 03/18/16 1350  TROPONINI <0.03   BNP: Invalid input(s): POCBNP CBG: No results for input(s): GLUCAP in the last 168 hours. D-Dimer No results for input(s): DDIMER in the last 72 hours. Hgb A1c No results for input(s): HGBA1C in the last 72 hours. Lipid Profile No results for input(s): CHOL, HDL, LDLCALC, TRIG, CHOLHDL, LDLDIRECT in the last 72 hours. Thyroid function studies No results for input(s): TSH, T4TOTAL, T3FREE, THYROIDAB in the last 72  hours.  Invalid input(s): FREET3 Anemia work up No results for input(s): VITAMINB12, FOLATE, FERRITIN, TIBC, IRON, RETICCTPCT in the last 72 hours. Urinalysis    Component Value Date/Time   COLORURINE YELLOW 03/18/2016 1738   APPEARANCEUR CLOUDY (A) 03/18/2016 1738   LABSPEC 1.021 03/18/2016 1738   PHURINE 5.0 03/18/2016 1738   GLUCOSEU NEGATIVE 03/18/2016 1738   HGBUR NEGATIVE 03/18/2016 1738   BILIRUBINUR MODERATE (A) 03/18/2016 1738   KETONESUR NEGATIVE 03/18/2016 1738   PROTEINUR NEGATIVE 03/18/2016 1738   UROBILINOGEN 0.2 02/21/2015 2337   NITRITE NEGATIVE 03/18/2016 1738   LEUKOCYTESUR SMALL (A) 03/18/2016 1738   Sepsis Labs Invalid input(s): PROCALCITONIN,  WBC,  LACTICIDVEN Microbiology No results found for this or any previous visit (from the past 240 hour(s)).   Time coordinating discharge: Over 30 minutes  SIGNED:   Alba Cory, MD  [facility] 03/21/2016, 9:23 AM Pager [phone]  If 7PM-7AM, please contact night-coverage [email or web address] Password [email or web address]

## 2016-03-21 NOTE — Clinical Social Work Placement (Signed)
   CLINICAL SOCIAL WORK PLACEMENT  NOTE  Date:  03/21/2016  Patient Details  Name: [name] MRN: [id number] Date of Birth: March 05, 1923  Clinical Social Work is seeking post-discharge placement for this patient at the Skilled  Nursing Facility level of care (*CSW will initial, date and re-position this form in  chart as items are completed):  Yes   Patient/family provided with [facility] Clinical Social Work Department's list of facilities offering this level of care within the geographic area requested by the patient (or if unable, by the patient's family).  Yes   Patient/family informed of their freedom to choose among providers that offer the needed level of care, that participate in Medicare, Medicaid or managed care program needed by the patient, have an available bed and are willing to accept the patient.  Yes   Patient/family informed of [facility]'s ownership interest in [facility] and [facility], as well as of the fact that they are under no obligation to receive care at these facilities.  PASRR submitted to EDS on       PASRR number received on       Existing PASRR number confirmed on 03/19/16     FL2 transmitted to all facilities in geographic area requested by pt/family on 03/19/16     FL2 transmitted to all facilities within larger geographic area on       Patient informed that his/her managed care company has contracts with or will negotiate with certain facilities, including the following:        Yes   Patient/family informed of bed offers received.  Patient chooses bed at [facility]     Physician recommends and patient chooses bed at      Patient to be transferred to [facility] on 03/21/16.  Patient to be transferred to facility by [facility]     Patient family notified on 03/21/16 of transfer.  Name of family member notified:  Nephew     PHYSICIAN Please sign FL2     Additional Comment:     _______________________________________________ Mearl Latin, LCSWA 03/21/2016, 11:21 AM

## 2016-03-21 NOTE — Care Management Note (Signed)
Case Management Note  Patient Details  Name: [name] MRN: [id number] Date of Birth: 12-31-1922  Subjective/Objective:                    Action/Plan: Plan is to d/c to SNF today.  Expected Discharge Date:  03/21/16              Expected Discharge Plan:  Skilled Nursing Facility [facility] Care)  In-House Referral:  Clinical Social Work  Discharge planning Services  CM Consult Status of Service:  Completed, signed off  If discussed at [employer] of Stay Meetings, dates discussed:    Additional Comments:  Epifanio Lesches, RN 03/21/2016, 9:56 AM

## 2016-03-21 NOTE — Progress Notes (Addendum)
Patient will DC to: [facility] Anticipated DC date: 03/21/16 Family notified: VM left for nephew Transport by: Sharin Mons   Per MD patient ready for DC to [facility]. RN, patient, patient's family, and facility notified of DC. RN given number for report. DC packet on chart. Ambulance transport requested for patient.   CSW signing off.  Cristobal Goldmann, [place] Clinical Social Worker [phone]

## 2016-03-21 NOTE — Progress Notes (Signed)
Nsg Discharge Note  Admit Date:  03/18/2016 Discharge date: 03/21/2016   [name] to be D/C'd Skilled nursing facility per MD order to [facility]. Report was called to [facility].  Discharge Medication:   Medication List    STOP taking these medications   amLODipine 10 MG tablet Commonly known as:  NORVASC   ibuprofen 200 MG tablet Commonly known as:  ADVIL,MOTRIN   losartan 100 MG tablet Commonly known as:  COZAAR   oxyCODONE-acetaminophen 5-325 MG tablet Commonly known as:  PERCOCET/ROXICET     TAKE these medications   acetaminophen 325 MG tablet Commonly known as:  TYLENOL Take 2 tablets (650 mg total) by mouth every 6 (six) hours as needed for mild pain (or Fever >/= 101).   albuterol 108 (90 Base) MCG/ACT inhaler Commonly known as:  PROVENTIL HFA;VENTOLIN HFA Inhale 2 puffs into the lungs every 4 (four) hours as needed for wheezing or shortness of breath. What changed:  reasons to take this   allopurinol 300 MG tablet Commonly known as:  ZYLOPRIM Take 300 mg by mouth every morning.   atenolol 25 MG tablet Commonly known as:  TENORMIN Take 25 mg by mouth daily.   atorvastatin 20 MG tablet Commonly known as:  LIPITOR Take 20 mg by mouth at bedtime.   budesonide 0.25 MG/2ML nebulizer solution Commonly known as:  PULMICORT Take 2 mLs (0.25 mg total) by nebulization 2 (two) times daily. What changed:  when to take this   famotidine 20 MG tablet Commonly known as:  PEPCID Take 20 mg by mouth at bedtime.   ipratropium-albuterol 0.5-2.5 (3) MG/3ML Soln Commonly known as:  DUONEB Take 3 mLs by nebulization every 6 (six) hours as needed. What changed:  when to take this  reasons to take this   lactose free nutrition Liqd Take 237 mLs by mouth 3 (three) times daily between meals.   levofloxacin 500 MG tablet Commonly known as:  LEVAQUIN Take 1 tablet (500 mg total) by mouth daily.   meclizine 25 MG tablet Commonly known as:  ANTIVERT Take 0.5  tablets (12.5 mg total) by mouth 3 (three) times daily as needed for dizziness.   pantoprazole 40 MG tablet Commonly known as:  PROTONIX TAKE 1 TABLET BY MOUTH EVERY DAY 30-60 MINUTES BEFORE FIRST MEAL OF THE DAY   predniSONE 20 MG tablet Commonly known as:  DELTASONE Take 2 tablets (40 mg total) by mouth daily with breakfast.       Discharge Assessment: Vitals:   03/21/16 0607 03/21/16 1309  BP: (!) 147/85 134/71  Pulse: 87 88  Resp: 18   Temp: 97.9 F (36.6 C) 98 F (36.7 C)   Skin clean, dry and intact without evidence of skin break down, no evidence of skin tears noted. IV catheter discontinued intact. Site without signs and symptoms of complications - no redness or edema noted at insertion site, patient denies c/o pain - only slight tenderness at site.  Dressing with slight pressure applied.  D/c Instructions-Education: Discharge instructions given to patient/family with verbalized understanding. D/c education completed with patient/family including follow up instructions, medication list, d/c activities limitations if indicated, with other d/c instructions as indicated by MD - patient able to verbalize understanding, all questions fully answered. Patient instructed to return to ED, call 911, or call MD for any changes in condition.  Patient escorted via ambulance, and D/C to [facility].   Herma Mering, RN 03/21/2016 2:08 PM

## 2016-03-27 DIAGNOSIS — I10 Essential (primary) hypertension: Secondary | ICD-10-CM | POA: Diagnosis not present

## 2016-03-27 DIAGNOSIS — E43 Unspecified severe protein-calorie malnutrition: Secondary | ICD-10-CM | POA: Diagnosis not present

## 2016-03-27 DIAGNOSIS — R531 Weakness: Secondary | ICD-10-CM | POA: Diagnosis not present

## 2016-03-27 DIAGNOSIS — J449 Chronic obstructive pulmonary disease, unspecified: Secondary | ICD-10-CM | POA: Diagnosis not present

## 2016-04-15 ENCOUNTER — Emergency Department (HOSPITAL_COMMUNITY): Payer: Commercial Managed Care - HMO

## 2016-04-15 ENCOUNTER — Encounter (HOSPITAL_COMMUNITY): Payer: Self-pay | Admitting: Emergency Medicine

## 2016-04-15 ENCOUNTER — Observation Stay (HOSPITAL_COMMUNITY)
Admission: EM | Admit: 2016-04-15 | Discharge: 2016-04-16 | Disposition: A | Discharge status: Home / Self Care | Payer: Commercial Managed Care - HMO | Attending: Internal Medicine | Admitting: Internal Medicine

## 2016-04-15 DIAGNOSIS — Z79899 Other long term (current) drug therapy: Secondary | ICD-10-CM | POA: Diagnosis not present

## 2016-04-15 DIAGNOSIS — J449 Chronic obstructive pulmonary disease, unspecified: Secondary | ICD-10-CM | POA: Diagnosis not present

## 2016-04-15 DIAGNOSIS — Z87891 Personal history of nicotine dependence: Secondary | ICD-10-CM | POA: Diagnosis not present

## 2016-04-15 DIAGNOSIS — G9340 Encephalopathy, unspecified: Secondary | ICD-10-CM | POA: Diagnosis present

## 2016-04-15 DIAGNOSIS — I10 Essential (primary) hypertension: Secondary | ICD-10-CM | POA: Diagnosis present

## 2016-04-15 DIAGNOSIS — R627 Adult failure to thrive: Secondary | ICD-10-CM | POA: Diagnosis not present

## 2016-04-15 DIAGNOSIS — R410 Disorientation, unspecified: Secondary | ICD-10-CM | POA: Insufficient documentation

## 2016-04-15 DIAGNOSIS — R402411 Glasgow coma scale score 13-15, in the field [EMT or ambulance]: Secondary | ICD-10-CM | POA: Diagnosis not present

## 2016-04-15 DIAGNOSIS — R4182 Altered mental status, unspecified: Secondary | ICD-10-CM | POA: Diagnosis present

## 2016-04-15 DIAGNOSIS — I6789 Other cerebrovascular disease: Secondary | ICD-10-CM | POA: Diagnosis not present

## 2016-04-15 DIAGNOSIS — F4489 Other dissociative and conversion disorders: Secondary | ICD-10-CM | POA: Diagnosis not present

## 2016-04-15 DIAGNOSIS — K219 Gastro-esophageal reflux disease without esophagitis: Secondary | ICD-10-CM | POA: Diagnosis present

## 2016-04-15 LAB — CBC WITH DIFFERENTIAL/PLATELET
BASOS ABS: 0 10*3/uL (ref 0.0–0.1)
BASOS PCT: 0 %
EOS ABS: 0.3 10*3/uL (ref 0.0–0.7)
Eosinophils Relative: 5 %
HCT: 37.9 % (ref 36.0–46.0)
HEMOGLOBIN: 12.6 g/dL (ref 12.0–15.0)
LYMPHS ABS: 1.8 10*3/uL (ref 0.7–4.0)
Lymphocytes Relative: 31 %
MCH: 29.2 pg (ref 26.0–34.0)
MCHC: 33.2 g/dL (ref 30.0–36.0)
MCV: 87.7 fL (ref 78.0–100.0)
Monocytes Absolute: 0.5 10*3/uL (ref 0.1–1.0)
Monocytes Relative: 9 %
NEUTROS PCT: 55 %
Neutro Abs: 3.2 10*3/uL (ref 1.7–7.7)
PLATELETS: 223 10*3/uL (ref 150–400)
RBC: 4.32 MIL/uL (ref 3.87–5.11)
RDW: 14.7 % (ref 11.5–15.5)
WBC: 5.8 10*3/uL (ref 4.0–10.5)

## 2016-04-15 LAB — URINALYSIS, ROUTINE W REFLEX MICROSCOPIC
Bilirubin Urine: NEGATIVE
GLUCOSE, UA: NEGATIVE mg/dL
Hgb urine dipstick: NEGATIVE
Ketones, ur: NEGATIVE mg/dL
LEUKOCYTES UA: NEGATIVE
NITRITE: NEGATIVE
PROTEIN: NEGATIVE mg/dL
Specific Gravity, Urine: 1.018 (ref 1.005–1.030)
pH: 7 (ref 5.0–8.0)

## 2016-04-15 LAB — COMPREHENSIVE METABOLIC PANEL
ALT: 11 U/L — AB (ref 14–54)
AST: 17 U/L (ref 15–41)
Albumin: 3.6 g/dL (ref 3.5–5.0)
Alkaline Phosphatase: 75 U/L (ref 38–126)
Anion gap: 6 (ref 5–15)
BILIRUBIN TOTAL: 0.7 mg/dL (ref 0.3–1.2)
BUN: 18 mg/dL (ref 6–20)
CHLORIDE: 107 mmol/L (ref 101–111)
CO2: 30 mmol/L (ref 22–32)
CREATININE: 0.8 mg/dL (ref 0.44–1.00)
Calcium: 9.8 mg/dL (ref 8.9–10.3)
Glucose, Bld: 97 mg/dL (ref 65–99)
Potassium: 4.3 mmol/L (ref 3.5–5.1)
Sodium: 143 mmol/L (ref 135–145)
TOTAL PROTEIN: 6.4 g/dL — AB (ref 6.5–8.1)

## 2016-04-15 LAB — AMMONIA: Ammonia: 18 umol/L (ref 9–35)

## 2016-04-15 MED ORDER — POLYETHYLENE GLYCOL 3350 17 G PO PACK
17.0000 g | PACK | Freq: Every day | ORAL | Status: DC | PRN
  Filled 2016-04-15: qty 1

## 2016-04-15 MED ORDER — OXYCODONE-ACETAMINOPHEN 5-325 MG PO TABS: 1.0000 | ORAL_TABLET | Freq: Three times a day (TID) | ORAL | Status: DC | PRN

## 2016-04-15 MED ORDER — ACETAMINOPHEN 325 MG PO TABS
650.0000 mg | ORAL_TABLET | Freq: Four times a day (QID) | ORAL | Status: DC | PRN
  Filled 2016-04-15: qty 2

## 2016-04-15 MED ORDER — ATENOLOL 25 MG PO TABS
25.0000 mg | ORAL_TABLET | Freq: Every day | ORAL | Status: DC
  Administered 2016-04-16: 25 mg via ORAL
  Filled 2016-04-15 (×2): qty 1

## 2016-04-15 MED ORDER — BISACODYL 5 MG PO TBEC
5.0000 mg | DELAYED_RELEASE_TABLET | Freq: Every day | ORAL | Status: DC | PRN
  Filled 2016-04-15: qty 1

## 2016-04-15 MED ORDER — BOOST PO LIQD
237.0000 mL | Freq: Three times a day (TID) | ORAL | Status: DC
  Administered 2016-04-15: 237 mL via ORAL
  Filled 2016-04-15: qty 237

## 2016-04-15 MED ORDER — ONDANSETRON HCL 4 MG/2ML IJ SOLN: 4.0000 mg | Freq: Four times a day (QID) | INTRAMUSCULAR | Status: DC | PRN

## 2016-04-15 MED ORDER — FAMOTIDINE 20 MG PO TABS
20.0000 mg | ORAL_TABLET | Freq: Every day | ORAL | Status: DC
  Filled 2016-04-15: qty 1

## 2016-04-15 MED ORDER — BOOST / RESOURCE BREEZE PO LIQD
1.0000 | Freq: Three times a day (TID) | ORAL | Status: DC
  Administered 2016-04-16: 1 via ORAL

## 2016-04-15 MED ORDER — IPRATROPIUM-ALBUTEROL 0.5-2.5 (3) MG/3ML IN SOLN: 3.0000 mL | RESPIRATORY_TRACT | Status: DC | PRN

## 2016-04-15 MED ORDER — ONDANSETRON HCL 4 MG PO TABS
4.0000 mg | ORAL_TABLET | Freq: Four times a day (QID) | ORAL | Status: DC | PRN
Start: 2016-04-15 — End: 2016-04-16

## 2016-04-15 MED ORDER — SODIUM CHLORIDE 0.9 % IV SOLN
INTRAVENOUS | Status: AC
  Administered 2016-04-15: 23:00:00 via INTRAVENOUS

## 2016-04-15 MED ORDER — ENOXAPARIN SODIUM 30 MG/0.3ML ~~LOC~~ SOLN
30.0000 mg | Freq: Every day | SUBCUTANEOUS | Status: DC
  Filled 2016-04-15: qty 0.3

## 2016-04-15 MED ORDER — SODIUM CHLORIDE 0.9 % IV BOLUS (SEPSIS)
500.0000 mL | Freq: Once | INTRAVENOUS | Status: AC
  Administered 2016-04-15: 500 mL via INTRAVENOUS

## 2016-04-15 MED ORDER — ATORVASTATIN CALCIUM 20 MG PO TABS
20.0000 mg | ORAL_TABLET | Freq: Every day | ORAL | Status: DC
  Filled 2016-04-15: qty 2
  Filled 2016-04-15 (×2): qty 1

## 2016-04-15 MED ORDER — BUDESONIDE 0.25 MG/2ML IN SUSP
0.2500 mg | Freq: Two times a day (BID) | RESPIRATORY_TRACT | Status: DC
  Administered 2016-04-15 – 2016-04-16 (×3): 0.25 mg via RESPIRATORY_TRACT
  Filled 2016-04-15 (×3): qty 2

## 2016-04-15 MED ORDER — ALLOPURINOL 300 MG PO TABS
300.0000 mg | ORAL_TABLET | Freq: Every day | ORAL | Status: DC
  Administered 2016-04-16: 300 mg via ORAL
  Filled 2016-04-15: qty 1

## 2016-04-15 NOTE — ED Triage Notes (Signed)
Per EMS, pt from home,  per family pt is confused and agitated. Ambulatory. A&Ox2. Denies pain.

## 2016-04-15 NOTE — ED Notes (Addendum)
Fabiola Backeramia (pt family member) [id number]

## 2016-04-15 NOTE — ED Notes (Signed)
In and out cath performed by Lura EmBreana Shayde Gervacio, witnesses by Nicholes Roughynthia Carneal

## 2016-04-15 NOTE — H&P (Signed)
History and Physical    [name]:[id number] DOB: 08/31/1922 DOA: 04/15/2016  PCP: Pearson GrippeJames Kim, MD   Patient coming from: Home   Chief Complaint: Confusion, agitation   HPI: [name] is a 82 y.o. female with medical history significant for COPD, GERD, and hypertension who is brought into the hospital for evaluation of confusion and agitation. History is obtained from the reported patient's family, ED personnel, and review of the EMR. Patient was recently hospitalized from 03/18/2016 - 03/21/2016 with AKI and orthostasis, and was discharged to a SNF. She has recently returned home from the nursing facility and is living with family who had become concerned for her apparent confusion and agitation. She is reportedly been speaking fluently, but her content of speech demonstrates profound confusion. EMS was activated for transport to the hospital. There had been no recent trauma, no apparent gait difficulty or weakness, no speech disturbance, and no known use of alcohol or illicit substances. Patient may have had some decrease in her appetite since return from the SNF, but otherwise seemed to be in her usual state until the development of this confusion and agitation.  ED Course: Upon arrival to the ED, patient is found to be afebrile, saturating adequately on room air, and with vital signs stable. Chest x-ray is negative for acute cardiopulmonary disease but notable for changes consistent with COPD and atherosclerosis of the aorta. CMP is essentially normal and CBC is also unremarkable. Urinalysis is unremarkable and not suggestive of infection. Patient denies any specific complaints in the emergency department and is alert, but oriented to person only. She has remained hemodynamically stable in the emergency department and with no apparent respiratory distress. She'll be observed on the medical-surgical unit for ongoing evaluation and management of acute encephalopathy.  Review of  Systems:  Unable to obtain ROS secondary to the patient's clinical condition with acute encephalopathy.  Past Medical History:  Diagnosis Date  . Arthritis   . Chronic bronchitis (HCC)   . COPD (chronic obstructive pulmonary disease) (HCC)   . GERD (gastroesophageal reflux disease)   . Gout   . H/O hiatal hernia   . High cholesterol   . Hypertension     Past Surgical History:  Procedure Laterality Date  . APPENDECTOMY  1945  . CATARACT EXTRACTION W/ INTRAOCULAR LENS  IMPLANT, BILATERAL Bilateral   . FRACTURE SURGERY    . HIP FRACTURE SURGERY     "don't remember which side"  . TEE WITHOUT CARDIOVERSION N/A 10/13/2013   Procedure: TRANSESOPHAGEAL ECHOCARDIOGRAM (TEE);  Surgeon: Quintella Reichertraci R Turner, MD;  [facility];  Service: Cardiovascular;  Laterality: N/A;     reports that she quit smoking about 7 years ago. She has a 35.00 pack-year smoking history. She quit smokeless tobacco use about 11 years ago. She reports that she does not drink alcohol or use drugs.  Allergies  Allergen Reactions  . Penicillins Rash    Tolerated ceftriaxone Has patient had a PCN reaction causing immediate rash, facial/tongue/throat swelling, SOB or lightheadedness with hypotension: unknown Has patient had a PCN reaction causing severe rash involving mucus membranes or skin necrosis: unknown Has patient had a PCN reaction that required hospitalization: unknown Has patient had a PCN reaction occurring within the last 10 years: no if all of the above answers are "NO", then may proceed with Cephalosporin use.     Family History  Problem Relation Age of Onset  . Cancer Mother   . Stroke Father   . Cirrhosis  Brother      Prior to Admission medications   Medication Sig Start Date End Date Taking? Authorizing Provider  ipratropium-albuterol (DUONEB) 0.5-2.5 (3) MG/3ML SOLN Take 3 mLs by nebulization every 6 (six) hours as needed. Patient taking differently: Take 3 mLs by nebulization every 4  (four) hours as needed (shortness of breath/ wheezing).  11/28/14  Yes Azalia Bilis, MD  acetaminophen (TYLENOL) 325 MG tablet Take 2 tablets (650 mg total) by mouth every 6 (six) hours as needed for mild pain (or Fever >/= 101). 03/21/16   Belkys A Regalado, MD  albuterol (PROVENTIL HFA;VENTOLIN HFA) 108 (90 BASE) MCG/ACT inhaler Inhale 2 puffs into the lungs every 4 (four) hours as needed for wheezing or shortness of breath. Patient taking differently: Inhale 2 puffs into the lungs every 4 (four) hours as needed for shortness of breath (wheezing and shortness of breath ((PLANB))).  11/28/14   Azalia Bilis, MD  allopurinol (ZYLOPRIM) 300 MG tablet Take 300 mg by mouth every morning.    Historical Provider, MD  atenolol (TENORMIN) 25 MG tablet Take 25 mg by mouth daily.     Historical Provider, MD  atorvastatin (LIPITOR) 20 MG tablet Take 20 mg by mouth at bedtime.    Historical Provider, MD  budesonide (PULMICORT) 0.25 MG/2ML nebulizer solution Take 2 mLs (0.25 mg total) by nebulization 2 (two) times daily. Patient taking differently: Take 0.25 mg by nebulization 3 (three) times daily.  10/12/15   Nyoka Cowden, MD  chlorpheniramine-HYDROcodone (TUSSIONEX) 10-8 MG/5ML SUER Take 5 mLs by mouth 2 (two) times daily as needed for cough. 04/15/16   Historical Provider, MD  famotidine (PEPCID) 20 MG tablet Take 20 mg by mouth at bedtime. 10/19/15   Historical Provider, MD  lactose free nutrition (BOOST) LIQD Take 237 mLs by mouth 3 (three) times daily between meals.    Historical Provider, MD  levofloxacin (LEVAQUIN) 500 MG tablet Take 1 tablet (500 mg total) by mouth daily. Patient not taking: Reported on 04/15/2016 03/21/16   Alba Cory, MD  meclizine (ANTIVERT) 25 MG tablet Take 0.5 tablets (12.5 mg total) by mouth 3 (three) times daily as needed for dizziness. Patient not taking: Reported on 02/21/2016 01/05/16   Arby Barrette, MD  oxyCODONE-acetaminophen (PERCOCET/ROXICET) 5-325 MG tablet Take 1 tablet by  mouth 2 (two) times daily as needed for pain. 04/15/16   Historical Provider, MD  pantoprazole (PROTONIX) 40 MG tablet TAKE 1 TABLET BY MOUTH EVERY DAY 30-60 MINUTES BEFORE FIRST MEAL OF THE DAY Patient not taking: Reported on 02/21/2016 10/22/15   Nyoka Cowden, MD  predniSONE (DELTASONE) 20 MG tablet Take 2 tablets (40 mg total) by mouth daily with breakfast. Patient not taking: Reported on 04/15/2016 03/21/16   Alba Cory, MD    Physical Exam: Vitals:   04/15/16 1523 04/15/16 1527 04/15/16 1741 04/15/16 2015  BP:  141/79 152/81 160/79  Pulse:  71 61 62  Resp:  18 16 20   Temp:  98 F (36.7 C)  97.6 F (36.4 C)  TempSrc:  Oral  Oral  SpO2: 97% 96% 98% 98%      Constitutional: NAD, calm, comfortable. Very thin.  Eyes: PERTLA, lids and conjunctivae normal ENMT: Mucous membranes are moist. Posterior pharynx clear of any exudate or lesions.   Neck: normal, supple, no masses, no thyromegaly Respiratory: Mildly diminished bilaterally, no wheezing, no crackles. Normal respiratory effort.   Cardiovascular: S1 & S2 heard, regular rate and rhythm. No extremity edema. No significant JVD.  Abdomen: No distension, no tenderness, no masses palpated. Bowel sounds normal.  Musculoskeletal: no clubbing / cyanosis. No joint deformity upper and lower extremities. Normal muscle tone.  Skin: no significant rashes, lesions, ulcers. Warm, dry, well-perfused. Neurologic: CN 2-12 grossly intact. Sensation intact, DTR normal. Strength 5/5 in all 4 limbs.  Psychiatric: Alert, but oriented to person only. Calm and cooperative.      Labs on Admission: I have personally reviewed following labs and imaging studies  CBC:  Recent Labs Lab 04/15/16 1643  WBC 5.8  NEUTROABS 3.2  HGB 12.6  HCT 37.9  MCV 87.7  PLT 223   Basic Metabolic Panel:  Recent Labs Lab 04/15/16 1643  NA 143  K 4.3  CL 107  CO2 30  GLUCOSE 97  BUN 18  CREATININE 0.80  CALCIUM 9.8   GFR: CrCl cannot be calculated  (Unknown ideal weight.). Liver Function Tests:  Recent Labs Lab 04/15/16 1643  AST 17  ALT 11*  ALKPHOS 75  BILITOT 0.7  PROT 6.4*  ALBUMIN 3.6   No results for input(s): LIPASE, AMYLASE in the last 168 hours. No results for input(s): AMMONIA in the last 168 hours. Coagulation Profile: No results for input(s): INR, PROTIME in the last 168 hours. Cardiac Enzymes: No results for input(s): CKTOTAL, CKMB, CKMBINDEX, TROPONINI in the last 168 hours. BNP (last 3 results)  Recent Labs  04/26/15 1419  PROBNP 37.0   HbA1C: No results for input(s): HGBA1C in the last 72 hours. CBG: No results for input(s): GLUCAP in the last 168 hours. Lipid Profile: No results for input(s): CHOL, HDL, LDLCALC, TRIG, CHOLHDL, LDLDIRECT in the last 72 hours. Thyroid Function Tests: No results for input(s): TSH, T4TOTAL, FREET4, T3FREE, THYROIDAB in the last 72 hours. Anemia Panel: No results for input(s): VITAMINB12, FOLATE, FERRITIN, TIBC, IRON, RETICCTPCT in the last 72 hours. Urine analysis:    Component Value Date/Time   COLORURINE YELLOW 04/15/2016 1545   APPEARANCEUR CLEAR 04/15/2016 1545   LABSPEC 1.018 04/15/2016 1545   PHURINE 7.0 04/15/2016 1545   GLUCOSEU NEGATIVE 04/15/2016 1545   HGBUR NEGATIVE 04/15/2016 1545   BILIRUBINUR NEGATIVE 04/15/2016 1545   KETONESUR NEGATIVE 04/15/2016 1545   PROTEINUR NEGATIVE 04/15/2016 1545   UROBILINOGEN 0.2 02/21/2015 2337   NITRITE NEGATIVE 04/15/2016 1545   LEUKOCYTESUR NEGATIVE 04/15/2016 1545   Sepsis Labs: @LABRCNTIP (procalcitonin:4,lacticidven:4) )No results found for this or any previous visit (from the past 240 hour(s)).   Radiological Exams on Admission: Dg Chest 2 View  Result Date: 04/15/2016 CLINICAL DATA:  82 y/o F; weakness today. History of chronic bronchitis, COPD, hiatal hernia. EXAM: CHEST  2 VIEW COMPARISON:  03/20/2016 chest radiograph. FINDINGS: No consolidation, pneumothorax, or pleural effusion. Calcific aortic  atherosclerosis. Stable cardiac silhouette given projection. Hyperinflated lungs, coarse lung markings, and flattened diaphragms compatible with history COPD. No acute osseous abnormality is evident. IMPRESSION: No active cardiopulmonary disease. Findings of COPD. Aortic atherosclerosis. Electronically Signed   By: Mitzi Hansen M.D.   On: 04/15/2016 18:02    EKG: Not performed, will obtain as appropriate.   Assessment/Plan  1. Acute encephalopathy  - Uncertain etiology and baseline not yet well-established  - Unfortunately, family is currently unreachable by phone for clarification of baseline status  - Pt is oriented to person only, believing that she is at her new housing complex  - There are no focal neurologic deficits appreciated on exam and basic bloodwork, imaging, and UA all unrevealing - Check ammonia, TSH, B12, folate, RPR  - PT  eval requested    2. COPD  - Stable; no suggestion of exacerbation on admission  - Continue scheduled Pulmicort TID with DuoNeb q4h prn   3. Hypertension  - At goal currently, will continue current management with atenolol    4. GERD - No EGD on file  - Continue current management with daily Pepcid    DVT prophylaxis: sq Lovenox  Code Status: Full  Family Communication: Reached out to Tamia at [phone], but no answer  Disposition Plan: Observe on med-surg Consults called: None  Admission status: Observation    Briscoe Deutscher, MD [facility] Pager [phone]  If 7PM-7AM, please contact night-coverage [email or web address] Password [facility]  04/15/2016, 8:58 PM

## 2016-04-15 NOTE — ED Provider Notes (Signed)
[facility]-EMERGENCY DEPT Provider Note   CSN: [id number] Arrival date & time: 04/15/16  1516     History   Chief Complaint Chief Complaint  Patient presents with  . Altered Mental Status  Level V caveat due to dementia.  HPI [name] is a 82 y.o. female.  The history is provided by the patient and a relative.     Patient states that she is here because she wants to die here. She states she is doing well. Orally brought in for more confusion by her family. Some decreased oral intake. Recently out of rehabilitation after dehydration has been doing worse. No reported fevers chills. Patient's no complaints. Past Medical History:  Diagnosis Date  . Arthritis   . Chronic bronchitis (HCC)   . COPD (chronic obstructive pulmonary disease) (HCC)   . GERD (gastroesophageal reflux disease)   . Gout   . H/O hiatal hernia   . High cholesterol   . Hypertension     Patient Active Problem List   Diagnosis Date Noted  . Failure to thrive in adult 04/15/2016  . Orthostatic hypotension 03/18/2016  . Orthostatic dizziness 03/18/2016  . Postinflammatory pulmonary fibrosis (HCC) 04/29/2015  . Dyspnea 04/26/2015  . Severe malnutrition (HCC) 02/23/2015  . AKI (acute kidney injury) (HCC) 02/22/2015  . Arthritis 02/22/2015  . Weakness   . Dehydration 02/21/2015  . ARF (acute renal failure) (HCC) 10/03/2014  . Underweight 07/20/2014  . Acute bronchitis 07/20/2014  . COPD GOLD I with reversibility  07/18/2014  . Essential hypertension 07/18/2014  . Gout 07/18/2014  . SBO (small bowel obstruction) (HCC) 01/14/2014  . Hypercalcemia 01/14/2014  . Protein-calorie malnutrition, severe (HCC) 10/10/2013  . Acute respiratory failure (HCC) 10/07/2013  . Hypokalemia 10/07/2013  . H. influenzae septicemia (HCC) 12/06/2012  . Leukocytopenia, unspecified 12/03/2012  . HTN (hypertension) 12/02/2012  . Gouty arthritis 12/02/2012    Past Surgical History:  Procedure Laterality Date  .  APPENDECTOMY  1945  . CATARACT EXTRACTION W/ INTRAOCULAR LENS  IMPLANT, BILATERAL Bilateral   . FRACTURE SURGERY    . HIP FRACTURE SURGERY     "don't remember which side"  . TEE WITHOUT CARDIOVERSION N/A 10/13/2013   Procedure: TRANSESOPHAGEAL ECHOCARDIOGRAM (TEE);  Surgeon: Quintella Reichert, MD;  Location: [facility] ENDOSCOPY;  Service: Cardiovascular;  Laterality: N/A;    OB History    No data available       Home Medications    Prior to Admission medications   Medication Sig Start Date End Date Taking? Authorizing Provider  ipratropium-albuterol (DUONEB) 0.5-2.5 (3) MG/3ML SOLN Take 3 mLs by nebulization every 6 (six) hours as needed. Patient taking differently: Take 3 mLs by nebulization every 4 (four) hours as needed (shortness of breath/ wheezing).  11/28/14  Yes Azalia Bilis, MD  acetaminophen (TYLENOL) 325 MG tablet Take 2 tablets (650 mg total) by mouth every 6 (six) hours as needed for mild pain (or Fever >/= 101). 03/21/16   Belkys A Regalado, MD  albuterol (PROVENTIL HFA;VENTOLIN HFA) 108 (90 BASE) MCG/ACT inhaler Inhale 2 puffs into the lungs every 4 (four) hours as needed for wheezing or shortness of breath. Patient taking differently: Inhale 2 puffs into the lungs every 4 (four) hours as needed for shortness of breath (wheezing and shortness of breath ((PLANB))).  11/28/14   Azalia Bilis, MD  allopurinol (ZYLOPRIM) 300 MG tablet Take 300 mg by mouth every morning.    Historical Provider, MD  atenolol (TENORMIN) 25 MG tablet Take 25 mg by mouth  daily.     Historical Provider, MD  atorvastatin (LIPITOR) 20 MG tablet Take 20 mg by mouth at bedtime.    Historical Provider, MD  budesonide (PULMICORT) 0.25 MG/2ML nebulizer solution Take 2 mLs (0.25 mg total) by nebulization 2 (two) times daily. Patient taking differently: Take 0.25 mg by nebulization 3 (three) times daily.  10/12/15   Nyoka Cowden, MD  chlorpheniramine-HYDROcodone (TUSSIONEX) 10-8 MG/5ML SUER Take 5 mLs by mouth 2 (two)  times daily as needed for cough. 04/15/16   Historical Provider, MD  famotidine (PEPCID) 20 MG tablet Take 20 mg by mouth at bedtime. 10/19/15   Historical Provider, MD  lactose free nutrition (BOOST) LIQD Take 237 mLs by mouth 3 (three) times daily between meals.    Historical Provider, MD  levofloxacin (LEVAQUIN) 500 MG tablet Take 1 tablet (500 mg total) by mouth daily. Patient not taking: Reported on 04/15/2016 03/21/16   Alba Cory, MD  meclizine (ANTIVERT) 25 MG tablet Take 0.5 tablets (12.5 mg total) by mouth 3 (three) times daily as needed for dizziness. Patient not taking: Reported on 02/21/2016 01/05/16   Arby Barrette, MD  oxyCODONE-acetaminophen (PERCOCET/ROXICET) 5-325 MG tablet Take 1 tablet by mouth 2 (two) times daily as needed for pain. 04/15/16   Historical Provider, MD  pantoprazole (PROTONIX) 40 MG tablet TAKE 1 TABLET BY MOUTH EVERY DAY 30-60 MINUTES BEFORE FIRST MEAL OF THE DAY Patient not taking: Reported on 02/21/2016 10/22/15   Nyoka Cowden, MD  predniSONE (DELTASONE) 20 MG tablet Take 2 tablets (40 mg total) by mouth daily with breakfast. Patient not taking: Reported on 04/15/2016 03/21/16   Alba Cory, MD    Family History Family History  Problem Relation Age of Onset  . Cancer Mother   . Stroke Father   . Cirrhosis Brother     Social History Social History  Substance Use Topics  . Smoking status: Former Smoker    Packs/day: 0.50    Years: 70.00    Quit date: 08/18/2008  . Smokeless tobacco: Former Neurosurgeon    Quit date: 10/13/2004  . Alcohol use No     Allergies   Penicillins   Review of Systems Review of Systems  Unable to perform ROS: Dementia     Physical Exam Updated Vital Signs BP 160/79 (BP Location: Left Arm)   Pulse 62   Temp 97.6 F (36.4 C) (Oral)   Resp 20   SpO2 98%   Physical Exam  Constitutional: She appears well-developed.  HENT:  Head: Atraumatic.  Eyes: EOM are normal.  Neck: Neck supple.  Cardiovascular: Normal rate.     Pulmonary/Chest: Effort normal.  Abdominal: Soft. There is no tenderness.  Musculoskeletal: She exhibits no edema.  Neurological: She is alert.  Awake and pleasant. Mild confusion. Without complaints. Moving all extremities.  Skin: Skin is warm. Capillary refill takes less than 2 seconds.  Psychiatric: She has a normal mood and affect.     ED Treatments / Results  Labs (all labs ordered are listed, but only abnormal results are displayed) Labs Reviewed  COMPREHENSIVE METABOLIC PANEL - Abnormal; Notable for the following:       Result Value   Total Protein 6.4 (*)    ALT 11 (*)    All other components within normal limits  CBC WITH DIFFERENTIAL/PLATELET  URINALYSIS, ROUTINE W REFLEX MICROSCOPIC (NOT AT [facility])    EKG  EKG Interpretation None       Radiology Dg Chest 2 View  Result  Date: 04/15/2016 CLINICAL DATA:  82 y/o F; weakness today. History of chronic bronchitis, COPD, hiatal hernia. EXAM: CHEST  2 VIEW COMPARISON:  03/20/2016 chest radiograph. FINDINGS: No consolidation, pneumothorax, or pleural effusion. Calcific aortic atherosclerosis. Stable cardiac silhouette given projection. Hyperinflated lungs, coarse lung markings, and flattened diaphragms compatible with history COPD. No acute osseous abnormality is evident. IMPRESSION: No active cardiopulmonary disease. Findings of COPD. Aortic atherosclerosis. Electronically Signed   By: Mitzi HansenLance  Furusawa-Stratton M.D.   On: 04/15/2016 18:02    Procedures Procedures (including critical care time)  Medications Ordered in ED Medications  sodium chloride 0.9 % bolus 500 mL (0 mLs Intravenous Stopped 04/15/16 1839)     Initial Impression / Assessment and Plan / ED Course  I have reviewed the triage vital signs and the nursing notes.  Pertinent labs & imaging results that were available during my care of the patient were reviewed by me and considered in my medical decision making (see chart for details).  Clinical Course     Patient with worsening mental status. Dementia versus delirium. Patient states that she came to the hospital because she wants to die. Has had frequent visits to the ER in the last 2 months. At this point does not appear to be safe at home. Needs some further evaluation. Will observe in hospital for further evaluation and possible treatment.  Final Clinical Impressions(s) / ED Diagnoses   Final diagnoses:  Failure to thrive in adult    New Prescriptions New Prescriptions   No medications on file     Benjiman CoreNathan Jodilyn Giese, MD 04/15/16 2046

## 2016-04-15 NOTE — Progress Notes (Signed)
CSW attempted to contact pt's nephew, [name], with no answer. CSW left voicemail to return phone call. CSW will continue to update.   Stacy GardnerErin Karelyn Brisby, LCSWA Clinical Social Worker [phone]

## 2016-04-16 DIAGNOSIS — G9340 Encephalopathy, unspecified: Secondary | ICD-10-CM | POA: Diagnosis not present

## 2016-04-16 DIAGNOSIS — R269 Unspecified abnormalities of gait and mobility: Secondary | ICD-10-CM | POA: Diagnosis not present

## 2016-04-16 DIAGNOSIS — J449 Chronic obstructive pulmonary disease, unspecified: Secondary | ICD-10-CM | POA: Diagnosis not present

## 2016-04-16 LAB — GLUCOSE, CAPILLARY
GLUCOSE-CAPILLARY: 67 mg/dL (ref 65–99)
GLUCOSE-CAPILLARY: 92 mg/dL (ref 65–99)

## 2016-04-16 LAB — RAPID URINE DRUG SCREEN, HOSP PERFORMED
AMPHETAMINES: NOT DETECTED
Barbiturates: NOT DETECTED
Benzodiazepines: NOT DETECTED
Cocaine: NOT DETECTED
Opiates: NOT DETECTED
Tetrahydrocannabinol: NOT DETECTED

## 2016-04-16 LAB — TSH: TSH: 2.105 u[IU]/mL (ref 0.350–4.500)

## 2016-04-16 LAB — HIV ANTIBODY (ROUTINE TESTING W REFLEX): HIV SCREEN 4TH GENERATION: NONREACTIVE

## 2016-04-16 LAB — VITAMIN B12: VITAMIN B 12: 367 pg/mL (ref 180–914)

## 2016-04-16 LAB — RPR: RPR: NONREACTIVE

## 2016-04-16 MED ORDER — DONEPEZIL HCL 5 MG PO TABS: 5.0000 mg | ORAL_TABLET | Freq: Every day | ORAL | 0 refills | Status: DC

## 2016-04-16 MED ORDER — ALBUTEROL SULFATE (2.5 MG/3ML) 0.083% IN NEBU: 3.0000 mL | INHALATION_SOLUTION | RESPIRATORY_TRACT | Status: DC | PRN

## 2016-04-16 MED ORDER — POLYETHYLENE GLYCOL 3350 17 G PO PACK: 17.0000 g | PACK | Freq: Every day | ORAL | 0 refills | Status: DC | PRN

## 2016-04-16 NOTE — Progress Notes (Signed)
CSW received call from ED CM regarding patient returning home. CSW contacted patients nephew, [name], and he does not have his own car to get to the hospital. CSW suggested patient's nephew take a cab to hospital and CSW will provide them with a taxi voucher to return home. Patient's nephew agreed.   Stacy GardnerErin Genesis Paget, LCSWA Clinical Social Worker [phone]

## 2016-04-16 NOTE — Clinical Social Work Note (Signed)
Clinical Social Work Assessment  Patient Details  Name: [name] MRN: [id number] Date of Birth: 06/20/1923  Date of referral:  04/16/16               Reason for consult:  Discharge Planning                Permission sought to share information with:  Family Supports, Magazine features editoracility Contact Representative, Case Estate manager/land agentManager Permission granted to share information::  Yes, Verbal Permission Granted  Name::      [name]([name] and [name])  Agency::   (SNF's )  Relationship::   ([name] and [name]  Contact Information:   [phone] )  Housing/Transportation Living arrangements for the past 2 months:  Single Family Home Source of Information:  Other (Comment Required) ([name] Patient Interpreter Needed:  None Criminal Activity/Legal Involvement Pertinent to Current Situation/Hospitalization:  No - Comment as needed Significant Relationships:   ([name]) [name] with:   ([name] Do you feel safe going back to the place where you live?  No Need for family participation in patient care:  Yes (Comment)  Care giving concerns:  Patient likely needs SNF, pending PT evaluation.   Social Worker assessment / plan:  MSW contacted [name], [name] in reference to discharge planning. MSW introduced MSW role. [name] reported that [name]. Patient has used all 20 days and will be in co-pays day if needing SNF placement at dc. Pt's [name] advised MSW to contact pt's [name], [name] in regards to Lindsay post dc plans as Lindsay [name] and is the primary caregiver for patient.   MSW contacted [name], [name] who also [name] [facility] and that she will be in co-pays day. Lindsay [name] that he and family are not able to afford co pays etc. Pt's [name] is home with patient 24hr per day and patient is never left  alone. Pt's [name] asked for medical updates and MSW advised him to discuss medical care/interventions with bedside RN and or MD. Pt's [name] is also aware that patient will likely dc today, 8/30 if medical work up is negative.   Pt's [name] requesting home health care and RN to be included. RNCM notified. Facility [name] that Medicaid application is pending.   [name], [name] can be reached at [phone] or [phone]. No further concerns reported at this time. PT evaluation pending. FL2 completed and pending however will not be faxed due to patient returning home.   Medical Social Worker will sign off for now as social work intervention is no longer needed. Please consult us again if new need arises.  Employment status:  Retired Database administratornsurance information:  Managed Medicare PT Recommendations:  Not assessed at this time Information / Referral to community resources:  Skilled Nursing Facility  Patient/Family's Response to care:  Pt disoriented x4. Lindsay [name] is primary caregiver who [name] with patient and available 24/7. Pt's [name] agreeable to patient returning home with home health care and also requesting RN. Pt's [name] pleasant, supportive, involved in care and appreciated social work intervention.   Patient/Family's Understanding of and Emotional Response to Diagnosis, Current Treatment, and Prognosis:  MSW advised pt's [name] to contact bedside RN and or discuss care with MD for medical updates on interventions.   Emotional Assessment Appearance:  Appears [name] age Attitude/Demeanor/Rapport:  Unable to Assess Affect (typically observed):  Unable to Assess Orientation:   (Disoriented x4) Alcohol / Substance use:  Not Applicable Psych involvement (Current and /or in the community):  No (Comment)  Discharge Needs  Concerns to be addressed:  Care Coordination Readmission within the last 30 days:  No Current discharge  risk:  None Barriers to Discharge:  English as a second language teacher, Continued Medical Work up   [employer], MSW [phone] 04/16/2016 10:40 AM

## 2016-04-16 NOTE — Progress Notes (Signed)
Pt discharged home with family friend Tamia in stable condition. Discharge instructions given. Scripts sent to pharmacy of choice. Rolling walker sent with patient and family friend.

## 2016-04-16 NOTE — Progress Notes (Signed)
CSW received call back from patients nephew stating a family friend would be picking up patient. Family friends name is Jana Halfamia Rucker and came in with patient in the ED.   Stacy GardnerErin Desmond Szabo, LCSWA Clinical Social Worker [phone]

## 2016-04-16 NOTE — Care Management Note (Signed)
Case Management Note  Patient Details  Name: Lindsay HammingCallie B Harmon MRN: [id number] Date of Birth: 08/19/1922  Subjective/Objective:           82 yo admitted with Acute encephalopathy         Action/Plan: Pt from home with 24hr caregiver (great nephew [name] [phone]). [facility]. [name] unsure which home health company pt was set up with when discharged from SNF. This CM [facility] and was informed that [facility] has [facility]. [facility]. This CM contacted [name] to inform him that [facility] after hospital discharge. [facility]. Pt in observation status and will not need new HH orders if stays observation. [name] request MD call him to inform him of pt condition. MD paged to request communication.  PT recommended RW for pt and [name] confirmed that she does not have one. Order received and [facility]. CM will continue to follow.   Expected Discharge Date:   (unknown)               Expected Discharge Plan:  Home w Home Health Services  In-House Referral:  Clinical Social Work  Discharge planning Services  CM Consult  Post Acute Care Choice:    Choice offered to:     DME Arranged:    DME Agency:     HH Arranged:    HH Agency:  [facility]  Status of Service:  In process, will continue to follow  If discussed at Long Length of Stay Meetings, dates discussed:    Additional CommentsBartholome Bill:  Alley Neils H, RN 04/16/2016, 1:15 PM  [phone]

## 2016-04-16 NOTE — Discharge Summary (Signed)
[facility] Discharge Summary   Patient: [name]:[id number]   PCP: Pearson Grippe, MD DOB: 09-24-1922   Date of admission: 04/15/2016   Date of discharge:  04/16/2016    Discharge Diagnoses:  Principal Problem:   Acute encephalopathy Active Problems:   COPD GOLD I with reversibility    Essential hypertension   Failure to thrive in adult   GERD (gastroesophageal reflux disease)  Admitted From: Home Disposition:  Home with home health  Recommendations for Outpatient Follow-up:  1. Follow-up with PCP in one week   Follow-up Information    Pearson Grippe, MD. Schedule an appointment as soon as possible for a visit in 1 week(s).   Specialty:  Internal Medicine Contact information: [address] [place] [address] [place] [address] [phone]        [facility] .   Specialty:  Home Health Services Contact information: [address] [place] [address] [phone]          Diet recommendation: Regular diet  Activity: The patient is advised to gradually reintroduce usual activities.  Discharge Condition: good  Code Status: Full code  History of present illness: As per the H and P dictated on admission, "[name] is a 82 y.o. female with medical history significant for COPD, GERD, and hypertension who is brought into the hospital for evaluation of confusion and agitation. History is obtained from the reported patient's family, ED personnel, and review of the EMR. Patient was recently hospitalized from 03/18/2016 - 03/21/2016 with AKI and orthostasis, and was discharged to a SNF. She has recently returned home from the nursing facility and is living with family who had become concerned for her apparent confusion and agitation. She is reportedly been speaking fluently, but her content of speech demonstrates profound confusion. EMS was activated for transport to the hospital. There had been no recent trauma, no apparent gait difficulty or weakness, no  speech disturbance, and no known use of alcohol or illicit substances. Patient may have had some decrease in her appetite since return from the SNF, but otherwise seemed to be in her usual state until the development of this confusion and agitation."  Hospital Course:  Summary of her active problems in the hospital is as following.  Principal Problem:   Acute encephalopathy Dementia with delirium. Patient was brought into the hospital with confusion. Patient was recently hospitalized and discharged to SNF. And then recently discharged back to home. Family member further the patient was confused and therefore brought to the hospital. At present neurologically the patient does not have any focal deficit. Patient is alert awake and oriented to time place and person. Patient has moments of confusion which likely appears to be undiagnosed dementia. Extensive workup is unremarkable. No Indication for CNS imaging at present.  Likely cause of patient's presentation is undiagnosed chronic dementia with acute delirium from recent hospitalization as well as use of psychotropic medication. Recommend to attempt to use Aricept.  Active Problems:   Essential hypertension Blood pressure mildly elevated. Although I expect the blood pressure to normalize once the patient returning home and environment. We'll continue to monitor.    Failure to thrive in adult PTOT consulted. Recommend home health. We'll continue to monitor.  All other chronic medical condition were stable during the hospitalization.  Patient was seen by physical therapy, who recommended home health, which was arranged by Child psychotherapist and case Production designer, theatre/television/film. On the day of the discharge the patient's condition was stable, and no other  acute medical condition were reported by patient. the patient was felt safe to be discharge at home with home health.  Procedures and Results:  none   Consultations:  none  DISCHARGE  MEDICATION: Current Discharge Medication List    START taking these medications   Details  donepezil (ARICEPT) 5 MG tablet Take 1 tablet (5 mg total) by mouth at bedtime. Qty: 30 tablet, Refills: 0    polyethylene glycol (MIRALAX / GLYCOLAX) packet Take 17 g by mouth daily as needed for mild constipation. Qty: 14 each, Refills: 0      CONTINUE these medications which have NOT CHANGED   Details  acetaminophen (TYLENOL) 325 MG tablet Take 2 tablets (650 mg total) by mouth every 6 (six) hours as needed for mild pain (or Fever >/= 101). Qty: 10 tablet, Refills: 0    albuterol (PROVENTIL HFA;VENTOLIN HFA) 108 (90 BASE) MCG/ACT inhaler Inhale 2 puffs into the lungs every 4 (four) hours as needed for wheezing or shortness of breath. Qty: 1 Inhaler, Refills: 0    allopurinol (ZYLOPRIM) 300 MG tablet Take 300 mg by mouth every morning.    atenolol (TENORMIN) 25 MG tablet Take 25 mg by mouth daily.     atorvastatin (LIPITOR) 20 MG tablet Take 20 mg by mouth at bedtime.    budesonide (PULMICORT) 0.25 MG/2ML nebulizer solution Take 2 mLs (0.25 mg total) by nebulization 2 (two) times daily. Qty: 120 mL, Refills: 11    famotidine (PEPCID) 20 MG tablet Take 20 mg by mouth at bedtime. Refills: 2    ipratropium-albuterol (DUONEB) 0.5-2.5 (3) MG/3ML SOLN Take 3 mLs by nebulization every 6 (six) hours as needed. Qty: 360 mL, Refills: 3    meclizine (ANTIVERT) 25 MG tablet Take 0.5 tablets (12.5 mg total) by mouth 3 (three) times daily as needed for dizziness. Qty: 20 tablet, Refills: 0    pantoprazole (PROTONIX) 40 MG tablet TAKE 1 TABLET BY MOUTH EVERY DAY 30-60 MINUTES BEFORE FIRST MEAL OF THE DAY Qty: 30 tablet, Refills: 0    chlorpheniramine-HYDROcodone (TUSSIONEX) 10-8 MG/5ML SUER Take 5 mLs by mouth 2 (two) times daily as needed for cough.    lactose free nutrition (BOOST) LIQD Take 237 mLs by mouth 3 (three) times daily between meals.      STOP taking these medications      oxyCODONE-acetaminophen (PERCOCET/ROXICET) 5-325 MG tablet        Allergies  Allergen Reactions  . Penicillins Rash    Tolerated ceftriaxone Has patient had a PCN reaction causing immediate rash, facial/tongue/throat swelling, SOB or lightheadedness with hypotension: unknown Has patient had a PCN reaction causing severe rash involving mucus membranes or skin necrosis: unknown Has patient had a PCN reaction that required hospitalization: unknown Has patient had a PCN reaction occurring within the last 10 years: no if all of the above answers are "NO", then may proceed with Cephalosporin use.    Discharge Instructions    Diet - low sodium heart healthy    Complete by:  As directed   Discharge instructions    Complete by:  As directed   It is important that you read following instructions as well as go over your medication list with RN to help you understand your care after this hospitalization.  Discharge Instructions: Please follow-up with PCP in one week  Please request your primary care physician to go over all Hospital Tests and Procedure/Radiological results at the follow up,  Please get all Hospital records sent to your PCP by  signing hospital release before you go home.   Do not drive, operating heavy machinery, perform activities at heights, swimming or participation in water activities or provide baby sitting services; until you have been seen by Primary Care Physician or a Neurologist and advised to do so again. Do not take more than prescribed Pain, Sleep and Anxiety Medications. You were cared for by a hospitalist during your hospital stay. If you have any questions about your discharge medications or the care you received while you were in the hospital after you are discharged, you can call the unit and ask to speak with the hospitalist on call if the hospitalist that took care of you is not available.  Once you are discharged, your primary care physician will handle any  further medical issues. Please note that NO REFILLS for any discharge medications will be authorized once you are discharged, as it is imperative that you return to your primary care physician (or establish a relationship with a primary care physician if you do not have one) for your aftercare needs so that they can reassess your need for medications and monitor your lab values. You Must read complete instructions/literature along with all the possible adverse reactions/side effects for all the Medicines you take and that have been prescribed to you. Take any new Medicines after you have completely understood and accept all the possible adverse reactions/side effects. Wear Seat belts while driving. If you have smoked or chewed Tobacco in the last 2 yrs please stop smoking and/or stop any Recreational drug use.   Increase activity slowly    Complete by:  As directed     Discharge Exam: [facility] Weights   04/15/16 2227 04/16/16 0527  Weight: 46.8 kg (103 lb 2.8 oz) 44.7 kg (98 lb 8.7 oz)   Vitals:   04/16/16 0527 04/16/16 1409  BP: (!) 161/75 (!) 183/91  Pulse: 63 (!) 56  Resp: 17 14  Temp: 97.6 F (36.4 C) 98.1 F (36.7 C)   General: Appear in no distress, no Rash; Oral Mucosa moist. Cardiovascular: S1 and S2 Present, aortic systolic Murmur, no JVD Respiratory: Bilateral Air entry present and Clear to Auscultation, no Crackles, no wheezes Abdomen: Bowel Sound present, Soft and no tenderness Extremities: no Pedal edema, no calf tenderness Neurology: Grossly no focal neuro deficit.  The results of significant diagnostics from this hospitalization (including imaging, microbiology, ancillary and laboratory) are listed below for reference.    Significant Diagnostic Studies: Dg Chest 2 View  Result Date: 04/15/2016 CLINICAL DATA:  82 y/o F; weakness today. History of chronic bronchitis, COPD, hiatal hernia. EXAM: CHEST  2 VIEW COMPARISON:  03/20/2016 chest radiograph. FINDINGS: No  consolidation, pneumothorax, or pleural effusion. Calcific aortic atherosclerosis. Stable cardiac silhouette given projection. Hyperinflated lungs, coarse lung markings, and flattened diaphragms compatible with history COPD. No acute osseous abnormality is evident. IMPRESSION: No active cardiopulmonary disease. Findings of COPD. Aortic atherosclerosis. Electronically Signed   By: Mitzi Hansen M.D.   On: 04/15/2016 18:02   Dg Chest 2 View  Result Date: 03/18/2016 CLINICAL DATA:  Shortness of breath. EXAM: CHEST  2 VIEW COMPARISON:  Radiographs of February 23, 2016. FINDINGS: The heart size and mediastinal contours are within normal limits. Atherosclerosis of thoracic aorta is noted. No pneumothorax or pleural effusion is noted. Mild interstitial densities are noted in the perihilar and basilar regions which may represent edema. The visualized skeletal structures are unremarkable. IMPRESSION: Possible mild bilateral perihilar and basilar edema. Aortic atherosclerosis. Electronically Signed  By: Lupita RaiderJames  Green Jr, M.D.   On: 03/18/2016 14:51   Dg Chest Port 1 View  Result Date: 03/20/2016 CLINICAL DATA:  82 year old female with shortness breath and wheezing with productive cough. Subsequent encounter. EXAM: PORTABLE CHEST 1 VIEW COMPARISON:  03/18/2016. FINDINGS: Consolidation retrocardiac region may represent atelectasis or infiltrate. Pulmonary vascular congestion superimposed upon chronic changes. Heart size top-normal. Calcified aorta. IMPRESSION: Consolidation retrocardiac region may represent atelectasis or infiltrate. Pulmonary vascular congestion superimposed upon chronic changes. Aortic atherosclerosis. Electronically Signed   By: Lacy DuverneySteven  Olson M.D.   On: 03/20/2016 12:15    Microbiology: No results found for this or any previous visit (from the past 240 hour(s)).   Labs: CBC:  Recent Labs Lab 04/15/16 1643  WBC 5.8  NEUTROABS 3.2  HGB 12.6  HCT 37.9  MCV 87.7  PLT 223   Basic  Metabolic Panel:  Recent Labs Lab 04/15/16 1643  NA 143  K 4.3  CL 107  CO2 30  GLUCOSE 97  BUN 18  CREATININE 0.80  CALCIUM 9.8   Liver Function Tests:  Recent Labs Lab 04/15/16 1643  AST 17  ALT 11*  ALKPHOS 75  BILITOT 0.7  PROT 6.4*  ALBUMIN 3.6   No results for input(s): LIPASE, AMYLASE in the last 168 hours.  Recent Labs Lab 04/15/16 2250  AMMONIA 18   Cardiac Enzymes: No results for input(s): CKTOTAL, CKMB, CKMBINDEX, TROPONINI in the last 168 hours. BNP (last 3 results)  Recent Labs  03/18/16 1358 03/20/16 1028  BNP 66.7 109.6*   CBG:  Recent Labs Lab 04/16/16 0829 04/16/16 0852  GLUCAP 67 92   Time spent: 30 minutes  Signed:  Lynden OxfordPATEL, Becker Christopher  [facility]  04/16/2016  , 6:44 PM

## 2016-04-16 NOTE — Evaluation (Signed)
Physical Therapy Evaluation Patient Details Name: [name] MRN: [id number] DOB: September 21, 1922 Today's Date: 04/16/2016   History of Present Illness  82 y.o. female with medical history significant for COPD, GERD, and hypertension who is brought into the hospital for evaluation of confusion and agitation. Patient was recently hospitalized from 03/18/2016 - 03/21/2016 with AKI and orthostasis, discharged to a SNF, and recently returned home.  Clinical Impression  Pt admitted with above diagnosis. Pt currently with functional limitations due to the deficits listed below (see PT Problem List).  Pt will benefit from skilled PT to increase their independence and safety with mobility to allow discharge to the venue listed below.  Upon entering room, pt reports feeling wet.  Pt with soiled linen so assisted OOB and provided pericare.  Pt agreeable to ambulate in hallway and performed min/guard with RW.  Recommend initial 24/7 assist upon d/c for safety.     Follow Up Recommendations Home health PT;Supervision/Assistance - 24 hour    Equipment Recommendations  Rolling walker with 5" wheels    Recommendations for Other Services       Precautions / Restrictions Precautions Precautions: Fall      Mobility  Bed Mobility Overal bed mobility: Needs Assistance Bed Mobility: Supine to Sit     Supine to sit: Min guard     General bed mobility comments: provided a hand for pt to self assist  Transfers Overall transfer level: Needs assistance Equipment used: Rolling walker (2 wheeled) Transfers: Sit to/from Stand Sit to Stand: Min assist         General transfer comment: assist to steady upon initial rise, performed again min/guard  Ambulation/Gait Ambulation/Gait assistance: Min guard Ambulation Distance (Feet): 120 Feet Assistive device: Rolling walker (2 wheeled) Gait Pattern/deviations: Step-through pattern;Decreased stride length     General Gait Details: pt appears steady  with RW, no LOB, fatigues quickly  Stairs            Wheelchair Mobility    Modified Rankin (Stroke Patients Only)       Balance Overall balance assessment: History of Falls                                           Pertinent Vitals/Pain Pain Assessment: No/denies pain    Home Living Family/patient expects to be discharged to:: Private residence Living Arrangements: Other relatives Available Help at Discharge: Family;Available PRN/intermittently Type of Home: Apartment Home Access: Level entry     Home Layout: One level Home Equipment: Cane - single point      Prior Function Level of Independence: Independent with assistive device(s)         Comments: pt states she was not using assistive device prior to arrival?     Hand Dominance        Extremity/Trunk Assessment               Lower Extremity Assessment: Generalized weakness         Communication   Communication: No difficulties  Cognition Arousal/Alertness: Awake/alert Behavior During Therapy: WFL for tasks assessed/performed Overall Cognitive Status: Within Functional Limits for tasks assessed                      General Comments      Exercises        Assessment/Plan    PT Assessment Patient needs continued PT  services  PT Diagnosis Difficulty walking;Generalized weakness   PT Problem List Decreased strength;Decreased mobility;Decreased balance;Decreased knowledge of use of DME;Decreased activity tolerance  PT Treatment Interventions DME instruction;Gait training;Functional mobility training;Balance training;Therapeutic exercise;Therapeutic activities;Patient/family education   PT Goals (Current goals can be found in the Care Plan section) Acute Rehab PT Goals PT Goal Formulation: With patient Time For Goal Achievement: 04/23/16 Potential to Achieve Goals: Good    Frequency Min 3X/week   Barriers to discharge        Co-evaluation                End of Session Equipment Utilized During Treatment: Gait belt Activity Tolerance: Patient tolerated treatment well Patient left: in chair;with call bell/phone within reach;with chair alarm set      Functional Assessment Tool Used: clinical judgement Functional Limitation: Mobility: Walking and moving around Mobility: Walking and Moving Around Current Status [phone](G8978): At least 20 percent but less than 40 percent impaired, limited or restricted Mobility: Walking and Moving Around Goal Status [phone](G8979): At least 1 percent but less than 20 percent impaired, limited or restricted    Time: 1003-1013 PT Time Calculation (min) (ACUTE ONLY): 10 min   Charges:   PT Evaluation $PT Eval Low Complexity: 1 Procedure     PT G Codes:   PT G-Codes **NOT FOR INPATIENT CLASS** Functional Assessment Tool Used: clinical judgement Functional Limitation: Mobility: Walking and moving around Mobility: Walking and Moving Around Current Status ([id number](G8978): At least 20 percent but less than 40 percent impaired, limited or restricted Mobility: Walking and Moving Around Goal Status [phone](G8979): At least 1 percent but less than 20 percent impaired, limited or restricted    Mili Piltz,KATHrine E 04/16/2016, 11:46 AM Zenovia JarredKati Rosamond Andress, PT, DPT 04/16/2016 Pager: [phone]

## 2016-04-16 NOTE — Progress Notes (Signed)
[facility].  [facility]. EDCM called and spoke to unit RN Kim to call patient's home to make sure someone will be there when patient arrives home.  No further EDCM needs at this time.

## 2016-04-17 LAB — FOLATE RBC
FOLATE, HEMOLYSATE: 535.9 ng/mL
Folate, RBC: 1659 ng/mL (ref >498)
Hematocrit: 32.3 % — ABNORMAL LOW (ref 34.0–46.6)

## 2016-04-18 ENCOUNTER — Encounter (HOSPITAL_COMMUNITY): Payer: Self-pay | Admitting: Emergency Medicine

## 2016-04-18 ENCOUNTER — Emergency Department (HOSPITAL_COMMUNITY)
Admission: EM | Admit: 2016-04-18 | Discharge: 2016-04-18 | Disposition: A | Discharge status: Home / Self Care | Payer: Commercial Managed Care - HMO | Attending: Emergency Medicine | Admitting: Emergency Medicine

## 2016-04-18 ENCOUNTER — Emergency Department (HOSPITAL_COMMUNITY): Payer: Commercial Managed Care - HMO

## 2016-04-18 DIAGNOSIS — R079 Chest pain, unspecified: Secondary | ICD-10-CM | POA: Diagnosis not present

## 2016-04-18 DIAGNOSIS — I10 Essential (primary) hypertension: Secondary | ICD-10-CM | POA: Diagnosis not present

## 2016-04-18 DIAGNOSIS — J441 Chronic obstructive pulmonary disease with (acute) exacerbation: Secondary | ICD-10-CM | POA: Diagnosis not present

## 2016-04-18 DIAGNOSIS — R05 Cough: Secondary | ICD-10-CM | POA: Diagnosis not present

## 2016-04-18 DIAGNOSIS — R52 Pain, unspecified: Secondary | ICD-10-CM | POA: Diagnosis not present

## 2016-04-18 DIAGNOSIS — R0602 Shortness of breath: Secondary | ICD-10-CM | POA: Diagnosis not present

## 2016-04-18 DIAGNOSIS — Z87891 Personal history of nicotine dependence: Secondary | ICD-10-CM | POA: Insufficient documentation

## 2016-04-18 LAB — CBC WITH DIFFERENTIAL/PLATELET
Basophils Absolute: 0 10*3/uL (ref 0.0–0.1)
Basophils Relative: 0 %
EOS ABS: 0.1 10*3/uL (ref 0.0–0.7)
EOS PCT: 1 %
HCT: 37.6 % (ref 36.0–46.0)
Hemoglobin: 11.7 g/dL — ABNORMAL LOW (ref 12.0–15.0)
LYMPHS ABS: 1.3 10*3/uL (ref 0.7–4.0)
LYMPHS PCT: 17 %
MCH: 28.1 pg (ref 26.0–34.0)
MCHC: 31.1 g/dL (ref 30.0–36.0)
MCV: 90.2 fL (ref 78.0–100.0)
MONO ABS: 1 10*3/uL (ref 0.1–1.0)
Monocytes Relative: 14 %
Neutro Abs: 4.9 10*3/uL (ref 1.7–7.7)
Neutrophils Relative %: 68 %
PLATELETS: 224 10*3/uL (ref 150–400)
RBC: 4.17 MIL/uL (ref 3.87–5.11)
RDW: 14.7 % (ref 11.5–15.5)
WBC: 7.3 10*3/uL (ref 4.0–10.5)

## 2016-04-18 LAB — COMPREHENSIVE METABOLIC PANEL
ALBUMIN: 3.2 g/dL — AB (ref 3.5–5.0)
ALT: 9 U/L — AB (ref 14–54)
AST: 18 U/L (ref 15–41)
Alkaline Phosphatase: 65 U/L (ref 38–126)
Anion gap: 6 (ref 5–15)
BILIRUBIN TOTAL: 0.8 mg/dL (ref 0.3–1.2)
BUN: 14 mg/dL (ref 6–20)
CHLORIDE: 104 mmol/L (ref 101–111)
CO2: 27 mmol/L (ref 22–32)
CREATININE: 0.87 mg/dL (ref 0.44–1.00)
Calcium: 9.5 mg/dL (ref 8.9–10.3)
GFR calc Af Amer: >60 mL/min (ref >60)
GFR calc non Af Amer: 57 mL/min — ABNORMAL LOW (ref >60)
GLUCOSE: 113 mg/dL — AB (ref 65–99)
POTASSIUM: 3.5 mmol/L (ref 3.5–5.1)
Sodium: 137 mmol/L (ref 135–145)
Total Protein: 5.6 g/dL — ABNORMAL LOW (ref 6.5–8.1)

## 2016-04-18 LAB — I-STAT TROPONIN, ED
TROPONIN I, POC: 0.02 ng/mL (ref 0.00–0.08)
Troponin i, poc: 0.02 ng/mL (ref 0.00–0.08)

## 2016-04-18 MED ORDER — PREDNISONE 20 MG PO TABS
40.0000 mg | ORAL_TABLET | Freq: Once | ORAL | Status: AC
  Administered 2016-04-18: 40 mg via ORAL
  Filled 2016-04-18: qty 2

## 2016-04-18 MED ORDER — DOXYCYCLINE HYCLATE 100 MG PO CAPS: 100.0000 mg | ORAL_CAPSULE | Freq: Two times a day (BID) | ORAL | 0 refills | Status: DC

## 2016-04-18 MED ORDER — ALBUTEROL SULFATE (2.5 MG/3ML) 0.083% IN NEBU
5.0000 mg | INHALATION_SOLUTION | Freq: Once | RESPIRATORY_TRACT | Status: AC
  Administered 2016-04-18: 5 mg via RESPIRATORY_TRACT
  Filled 2016-04-18: qty 6

## 2016-04-18 MED ORDER — PREDNISONE 20 MG PO TABS: 40.0000 mg | ORAL_TABLET | Freq: Every day | ORAL | 0 refills | Status: DC

## 2016-04-18 MED ORDER — ALBUTEROL SULFATE (2.5 MG/3ML) 0.083% IN NEBU
2.5000 mg | INHALATION_SOLUTION | Freq: Once | RESPIRATORY_TRACT | Status: AC
  Administered 2016-04-18: 2.5 mg via RESPIRATORY_TRACT
  Filled 2016-04-18: qty 3

## 2016-04-18 NOTE — ED Notes (Signed)
Patient left at this time with all belongings. 

## 2016-04-18 NOTE — ED Notes (Signed)
Pt spit on floor. This RN almost slipped and fell in it. Pt states I didn't have anything to spit into. Pt call light on bed rail. Pt verbalized that she understood how to use the call light. Pt responded "Well you ought to look down." Pt later apologized.

## 2016-04-18 NOTE — ED Notes (Signed)
Pt's son did not answer this RN's Call. Waiting for him to come get patient

## 2016-04-18 NOTE — ED Triage Notes (Signed)
Pt arrives via EMS from home with son, alert x2, complaining of shortness of breath. EMS reports generalized pain but no SHOB. Pt denies pain to this RN. Irritable/agitated with this RN, raising her voice to general questioning.

## 2016-04-18 NOTE — ED Notes (Signed)
Spoke with Son. Stated he would be on his way.

## 2016-04-18 NOTE — ED Notes (Signed)
Patient dressed and ready for discharge when son arrives

## 2016-04-18 NOTE — ED Notes (Signed)
Pt ambulated in hallway with walker. Pt tolerated well. Ambulated on Pulse Ox and ranged from 95% to 92%.

## 2016-04-18 NOTE — ED Notes (Signed)
PA called son to follow-up. Pt's son did not answer. PA left a message.

## 2016-04-18 NOTE — Discharge Instructions (Signed)
[name] was seen in the emergency room today for evaluation of shortness of breath. She likely had an exacerbation of her COPD. Her workup in the emergency room has been unremarkable. I will give her a prescription for antibiotics as well as a prescription for a few days of steroids. Take both prescriptions as directed. She may continue her nebulizer treatments every four hours as needed. Please have her follow up with her primary care provider next week. Return to the ER for new or worsening symptoms.

## 2016-04-18 NOTE — ED Notes (Signed)
Pt's son called and spoke with the RN. Stated he did not have a car and he would come pick her up around 6.

## 2016-04-18 NOTE — ED Notes (Signed)
Pt sitting in wheelchair at nurses station

## 2016-04-18 NOTE — ED Notes (Signed)
Pt given Malaw[place]key sandwich and drink. Pt sitting at the nurse's station in a wheelchair waiting for Son to arrive

## 2016-04-18 NOTE — ED Notes (Signed)
Pt's son called and stated that he went to the wrong hospital[facility]

## 2016-04-18 NOTE — ED Notes (Signed)
This RN went into the pt room to give her medicate and the pt had spit on the floor again. Pt stated well I didn't have anything to spit in. This RN requested as to why the pt did not use the call light and the pt responded "How was I supposed to call when I was holding this thing in my hand (breathing treatment mask)." This RN had left the mask in place with the straps around her head. Pt given a emesis bag and a box of tissue and instructed to spit into the tissue and then place it into the emesis bag.

## 2016-04-18 NOTE — ED Provider Notes (Signed)
4:02 PM Pt continues to be hemodynamically stable without hypoxia in the ED. She is resting comfortably without complaints. Her son has still not arrived to pick her up. I called an hour ago and again just now leaving voicemails to follow up on his ETA. He has reportedly been in court all day.  Vitals:   04/18/16 1345 04/18/16 1415 04/18/16 1445 04/18/16 1500  BP: 107/69 117/76 111/77 111/71  Pulse: 82 78 73 74  Resp: 24   19  SpO2: 95% 97% 96% 97%      Derl BarrowSerena Y Hara Milholland, PA-C 04/18/16 1603    April Palumbo, MD 04/19/16 2303

## 2016-04-18 NOTE — ED Notes (Signed)
Pt waiting for son to pick her up. Resting. Remains on the monitor.

## 2016-04-18 NOTE — ED Provider Notes (Signed)
[facility]-EMERGENCY DEPT Provider Note   CSN: [id number] Arrival date & time: 04/18/16  0507   History   Chief Complaint Chief Complaint  Patient presents with  . Shortness of Breath   HPI  Level V Caveat due to dementia  [name] is an 82 y.o. female with history of COPD, HTN, HLD, and likely undiagnosed dementia. History is obtained via EMS and EMR review. Ms. [name] was just discharged from the hospitalist service on 04/16/16 for an admission for altered mental status thought likely secondary to undiagnosed dementia. She returns to the ED today complaining of shortness of breath starting last night. States she couldn't breathe and took one breathing tx. She has also complained to EMS and nursing staff about being in generalized pain, and has been at times been agitated with the staff. Denies pain or complaints during our conversation.  Past Medical History:  Diagnosis Date  . Arthritis   . Chronic bronchitis (HCC)   . COPD (chronic obstructive pulmonary disease) (HCC)   . GERD (gastroesophageal reflux disease)   . Gout   . H/O hiatal hernia   . High cholesterol   . Hypertension     Patient Active Problem List   Diagnosis Date Noted  . Failure to thrive in adult 04/15/2016  . Acute encephalopathy 04/15/2016  . GERD (gastroesophageal reflux disease) 04/15/2016  . Orthostatic hypotension 03/18/2016  . Orthostatic dizziness 03/18/2016  . Postinflammatory pulmonary fibrosis (HCC) 04/29/2015  . Dyspnea 04/26/2015  . Severe malnutrition (HCC) 02/23/2015  . AKI (acute kidney injury) (HCC) 02/22/2015  . Arthritis 02/22/2015  . Weakness   . Dehydration 02/21/2015  . ARF (acute renal failure) (HCC) 10/03/2014  . Underweight 07/20/2014  . Acute bronchitis 07/20/2014  . COPD GOLD I with reversibility  07/18/2014  . Essential hypertension 07/18/2014  . Gout 07/18/2014  . SBO (small bowel obstruction) (HCC) 01/14/2014  . Hypercalcemia 01/14/2014  . Protein-calorie  malnutrition, severe (HCC) 10/10/2013  . Acute respiratory failure (HCC) 10/07/2013  . Hypokalemia 10/07/2013  . H. influenzae septicemia (HCC) 12/06/2012  . Leukocytopenia, unspecified 12/03/2012  . HTN (hypertension) 12/02/2012  . Gouty arthritis 12/02/2012    Past Surgical History:  Procedure Laterality Date  . APPENDECTOMY  1945  . CATARACT EXTRACTION W/ INTRAOCULAR LENS  IMPLANT, BILATERAL Bilateral   . FRACTURE SURGERY    . HIP FRACTURE SURGERY     "don't remember which side"  . TEE WITHOUT CARDIOVERSION N/A 10/13/2013   Procedure: TRANSESOPHAGEAL ECHOCARDIOGRAM (TEE);  Surgeon: Quintella Reichertraci R Turner, MD;  [facility];  Service: Cardiovascular;  Laterality: N/A;    OB History    No data available      Home Medications    Prior to Admission medications   Medication Sig Start Date End Date Taking? Authorizing Provider  acetaminophen (TYLENOL) 325 MG tablet Take 2 tablets (650 mg total) by mouth every 6 (six) hours as needed for mild pain (or Fever >/= 101). 03/21/16   Belkys A Regalado, MD  albuterol (PROVENTIL HFA;VENTOLIN HFA) 108 (90 BASE) MCG/ACT inhaler Inhale 2 puffs into the lungs every 4 (four) hours as needed for wheezing or shortness of breath. Patient taking differently: Inhale 2 puffs into the lungs every 4 (four) hours as needed for shortness of breath (wheezing and shortness of breath ((PLANB))).  11/28/14   Azalia BilisKevin Campos, MD  allopurinol (ZYLOPRIM) 300 MG tablet Take 300 mg by mouth every morning.    Historical Provider, MD  atenolol (TENORMIN) 25 MG tablet Take 25 mg  by mouth daily.     Historical Provider, MD  atorvastatin (LIPITOR) 20 MG tablet Take 20 mg by mouth at bedtime.    Historical Provider, MD  budesonide (PULMICORT) 0.25 MG/2ML nebulizer solution Take 2 mLs (0.25 mg total) by nebulization 2 (two) times daily. 10/12/15   Nyoka Cowden, MD  chlorpheniramine-HYDROcodone (TUSSIONEX) 10-8 MG/5ML SUER Take 5 mLs by mouth 2 (two) times daily as needed for  cough. 04/15/16   Historical Provider, MD  donepezil (ARICEPT) 5 MG tablet Take 1 tablet (5 mg total) by mouth at bedtime. 04/16/16   Rolly Salter, MD  famotidine (PEPCID) 20 MG tablet Take 20 mg by mouth at bedtime. 10/19/15   Historical Provider, MD  ipratropium-albuterol (DUONEB) 0.5-2.5 (3) MG/3ML SOLN Take 3 mLs by nebulization every 6 (six) hours as needed. Patient taking differently: Take 3 mLs by nebulization every 4 (four) hours as needed (shortness of breath/ wheezing).  11/28/14   Azalia Bilis, MD  lactose free nutrition (BOOST) LIQD Take 237 mLs by mouth 3 (three) times daily between meals.    Historical Provider, MD  meclizine (ANTIVERT) 25 MG tablet Take 0.5 tablets (12.5 mg total) by mouth 3 (three) times daily as needed for dizziness. 01/05/16   Arby Barrette, MD  pantoprazole (PROTONIX) 40 MG tablet TAKE 1 TABLET BY MOUTH EVERY DAY 30-60 MINUTES BEFORE FIRST MEAL OF THE DAY 10/22/15   Nyoka Cowden, MD  polyethylene glycol [facility] / GLYCOLAX) packet Take 17 g by mouth daily as needed for mild constipation. 04/16/16   Rolly Salter, MD    Family History Family History  Problem Relation Age of Onset  . Cancer Mother   . Stroke Father   . Cirrhosis Brother     Social History Social History  Substance Use Topics  . Smoking status: Former Smoker    Packs/day: 0.50    Years: 70.00    Quit date: 08/18/2008  . Smokeless tobacco: Former Neurosurgeon    Quit date: 10/13/2004  . Alcohol use No    Allergies   Penicillins   Review of Systems Review of Systems 10 Systems reviewed and are negative for acute change except as noted in the HPI.   Physical Exam Updated Vital Signs BP 113/81   Pulse 84   Resp (!) 27   SpO2 95%   Physical Exam  Constitutional: No distress.  Elderly, frail, NAD  HENT:  Head: Atraumatic.  Right Ear: External ear normal.  Left Ear: External ear normal.  Nose: Nose normal.  Mouth/Throat: Oropharynx is clear and moist.  Eyes: EOM are normal. Pupils  are equal, round, and reactive to light. No scleral icterus.  Arcus senilis  Cardiovascular: Normal rate, regular rhythm and normal heart sounds.   Pulmonary/Chest: Effort normal and breath sounds normal. No respiratory distress. She has no wheezes. She has no rales.  No increased WOB  Abdominal: Bowel sounds are normal. She exhibits no distension. There is no tenderness.  Neurological: She is alert.  Skin: Skin is warm and dry. She is not diaphoretic.  Psychiatric: She has a normal mood and affect. Her behavior is normal.  Nursing note and vitals reviewed.    ED Treatments / Results  Labs (all labs ordered are listed, but only abnormal results are displayed) Labs Reviewed  COMPREHENSIVE METABOLIC PANEL  CBC WITH DIFFERENTIAL/PLATELET  I-STAT TROPOININ, ED    EKG  EKG Interpretation None       Radiology No results found.  Procedures Procedures (including critical  care time)  Medications Ordered in ED Medications  albuterol (PROVENTIL) (2.5 MG/3ML) 0.083% nebulizer solution 5 mg (not administered)     Initial Impression / Assessment and Plan / ED Course  I have reviewed the triage vital signs and the nursing notes.  Pertinent labs & imaging results that were available during my care of the patient were reviewed by me and considered in my medical decision making (see chart for details).  Clinical Course    6:08 AM Patient seen and evaluated in conjunction with attending Dr. Nicanor Alcon. We will plan on obtaining CBC, metabolic panel, troponin, EKG, and a chest x-ray in this 82 y.o. patient with COPD and dementia presenting with chief complaint of shortness of breath. She was discharged from the hospital on 8/30 after an overnight admission for AMS. Plan discussed with Dr. Nicanor Alcon, likely admit to hospitalist service. However, if initial workup negative will ambulate, repeat troponin.Marland Kitchen  8:19 AM I spoke with Dorene Sorrow, pt's son, via telephone. He confirms pt lives with him  and states last night pt started complaining of SOB, did one neb tx at home which did not improve symptoms. Did not witness increased WOB. He states he has to go to court right now so cannot come to the ED but he would like pt to be admitted because "she turns sick really quickly."   11:08 AM Repeat troponin negative. Pt ambulated w/o problems or desat. She remains breathing comfortably and SpO2 >95% on RA. She has intermittent tachypnea but for the most part RR normal and she is breathing comfortably.  Pressures stable. Likely COPD exacerbation. I spoke with pt's son who is finishing [place] the courthouse and will be able to come pick her up. Will send home with rx for prednisone and doxycycline. Continue albuterol nebs at home q4h prn. Will remind pt's son to schedule close PCP f/u. They have home health arranged already from recent admission/discharge.   Final Clinical Impressions(s) / ED Diagnoses   Final diagnoses:  COPD exacerbation (HCC)  SOB (shortness of breath)    New Prescriptions New Prescriptions   DOXYCYCLINE (VIBRAMYCIN) 100 MG CAPSULE    Take 1 capsule (100 mg total) by mouth 2 (two) times daily.   PREDNISONE (DELTASONE) 20 MG TABLET    Take 2 tablets (40 mg total) by mouth daily.     Carlene Coria, PA-C 04/18/16 1135    April Palumbo, MD 04/19/16 2303

## 2016-04-23 ENCOUNTER — Emergency Department (HOSPITAL_COMMUNITY): Payer: Commercial Managed Care - HMO

## 2016-04-23 ENCOUNTER — Encounter (HOSPITAL_COMMUNITY): Payer: Self-pay

## 2016-04-23 ENCOUNTER — Emergency Department (HOSPITAL_COMMUNITY)
Admission: EM | Admit: 2016-04-23 | Discharge: 2016-04-23 | Disposition: A | Discharge status: Home / Self Care | Payer: Commercial Managed Care - HMO | Attending: Emergency Medicine | Admitting: Emergency Medicine

## 2016-04-23 DIAGNOSIS — Z7951 Long term (current) use of inhaled steroids: Secondary | ICD-10-CM | POA: Diagnosis not present

## 2016-04-23 DIAGNOSIS — Z87891 Personal history of nicotine dependence: Secondary | ICD-10-CM | POA: Insufficient documentation

## 2016-04-23 DIAGNOSIS — R4182 Altered mental status, unspecified: Secondary | ICD-10-CM | POA: Diagnosis not present

## 2016-04-23 DIAGNOSIS — I10 Essential (primary) hypertension: Secondary | ICD-10-CM | POA: Insufficient documentation

## 2016-04-23 DIAGNOSIS — J181 Lobar pneumonia, unspecified organism: Secondary | ICD-10-CM | POA: Insufficient documentation

## 2016-04-23 DIAGNOSIS — R410 Disorientation, unspecified: Secondary | ICD-10-CM | POA: Diagnosis not present

## 2016-04-23 DIAGNOSIS — J180 Bronchopneumonia, unspecified organism: Secondary | ICD-10-CM | POA: Diagnosis not present

## 2016-04-23 DIAGNOSIS — R05 Cough: Secondary | ICD-10-CM | POA: Diagnosis not present

## 2016-04-23 DIAGNOSIS — Z79899 Other long term (current) drug therapy: Secondary | ICD-10-CM | POA: Insufficient documentation

## 2016-04-23 DIAGNOSIS — J90 Pleural effusion, not elsewhere classified: Secondary | ICD-10-CM | POA: Diagnosis not present

## 2016-04-23 DIAGNOSIS — J449 Chronic obstructive pulmonary disease, unspecified: Secondary | ICD-10-CM | POA: Diagnosis not present

## 2016-04-23 DIAGNOSIS — J189 Pneumonia, unspecified organism: Secondary | ICD-10-CM

## 2016-04-23 DIAGNOSIS — R059 Cough, unspecified: Secondary | ICD-10-CM

## 2016-04-23 DIAGNOSIS — R0902 Hypoxemia: Secondary | ICD-10-CM | POA: Diagnosis not present

## 2016-04-23 LAB — CBC WITH DIFFERENTIAL/PLATELET
Basophils Absolute: 0 10*3/uL (ref 0.0–0.1)
Basophils Relative: 0 %
EOS ABS: 0.1 10*3/uL (ref 0.0–0.7)
EOS PCT: 1 %
HCT: 35.4 % — ABNORMAL LOW (ref 36.0–46.0)
HEMOGLOBIN: 11.3 g/dL — AB (ref 12.0–15.0)
LYMPHS ABS: 1.9 10*3/uL (ref 0.7–4.0)
Lymphocytes Relative: 27 %
MCH: 28.7 pg (ref 26.0–34.0)
MCHC: 31.9 g/dL (ref 30.0–36.0)
MCV: 89.8 fL (ref 78.0–100.0)
MONOS PCT: 5 %
Monocytes Absolute: 0.4 10*3/uL (ref 0.1–1.0)
Neutro Abs: 4.8 10*3/uL (ref 1.7–7.7)
Neutrophils Relative %: 67 %
PLATELETS: 288 10*3/uL (ref 150–400)
RBC: 3.94 MIL/uL (ref 3.87–5.11)
RDW: 14.8 % (ref 11.5–15.5)
WBC: 7.2 10*3/uL (ref 4.0–10.5)

## 2016-04-23 LAB — BASIC METABOLIC PANEL
Anion gap: 4 — ABNORMAL LOW (ref 5–15)
BUN: 19 mg/dL (ref 6–20)
CHLORIDE: 105 mmol/L (ref 101–111)
CO2: 31 mmol/L (ref 22–32)
CREATININE: 1.04 mg/dL — AB (ref 0.44–1.00)
Calcium: 9.3 mg/dL (ref 8.9–10.3)
GFR calc Af Amer: 53 mL/min — ABNORMAL LOW (ref >60)
GFR calc non Af Amer: 46 mL/min — ABNORMAL LOW (ref >60)
GLUCOSE: 142 mg/dL — AB (ref 65–99)
Potassium: 3.2 mmol/L — ABNORMAL LOW (ref 3.5–5.1)
SODIUM: 140 mmol/L (ref 135–145)

## 2016-04-23 LAB — PROTIME-INR
INR: 1.01
PROTHROMBIN TIME: 13.3 s (ref 11.4–15.2)

## 2016-04-23 MED ORDER — AZITHROMYCIN 250 MG PO TABS: 250.0000 mg | ORAL_TABLET | Freq: Every day | ORAL | 0 refills | Status: DC

## 2016-04-23 MED ORDER — POTASSIUM CHLORIDE CRYS ER 20 MEQ PO TBCR
40.0000 meq | EXTENDED_RELEASE_TABLET | Freq: Once | ORAL | Status: AC
  Administered 2016-04-23: 40 meq via ORAL
  Filled 2016-04-23: qty 2

## 2016-04-23 MED ORDER — DEXTROSE 5 % IV SOLN
1.0000 g | Freq: Once | INTRAVENOUS | Status: AC
  Administered 2016-04-23: 1 g via INTRAVENOUS
  Filled 2016-04-23: qty 10

## 2016-04-23 MED ORDER — DEXTROSE 5 % IV SOLN
500.0000 mg | Freq: Once | INTRAVENOUS | Status: AC
  Administered 2016-04-23: 500 mg via INTRAVENOUS
  Filled 2016-04-23: qty 500

## 2016-04-23 MED ORDER — ALBUTEROL SULFATE (2.5 MG/3ML) 0.083% IN NEBU
5.0000 mg | INHALATION_SOLUTION | Freq: Once | RESPIRATORY_TRACT | Status: AC
  Administered 2016-04-23: 5 mg via RESPIRATORY_TRACT
  Filled 2016-04-23: qty 6

## 2016-04-23 NOTE — ED Notes (Signed)
Patient transported to CT 

## 2016-04-23 NOTE — ED Notes (Signed)
Noted:  Pt brought in by EMS.  Pt is wearing blue paper scrubs.  Blue paper scrubs is what is given on discharge in ED when clothes are not available.  Concern expressed to social worker regarding home care status and ability to change own clothing.  Pt confused and unsure of when clothes changed last.

## 2016-04-23 NOTE — ED Notes (Signed)
Lab called stating one of the blood culture bottles needed to be recollected because the bottle was expired. Antibiotics had already been started. Per EDP, one set of cultures is adequate.

## 2016-04-23 NOTE — Progress Notes (Addendum)
EDCM noted patient has been seen in the ED 8 times within the last 6 months.  EDCM called and spoke to patient's nephew [name].  [name] confirms [facility].  He reports patient doesn't need an aide or Child psychotherapistsocial worker.   [name] reports the plan is to keep patient home with him.  He reports he does not want patient to go to a facility. He reports patient has good days and bad days in regards to her dementia. [facility] with [name] he is aware this is an out of pocket expense. [name] reports [facility] but [facility].  He reports he is going to help patient apply again. He reports he will make an appointment for the patient to be seen in Dr. Elmyra RicksKim's office next week, Dr. Selena BattenKim is out of office next week but patient will see another doctor per [name]. EDCM discussed [facility] services.  [name] agreeable to consult.  [facility] placed. EDCM also provided [name] with contact information for choice connections.  [name] thankful for services.  He is awaiting call from the nurse to come pick up the patient.  No further EDCM needs at this time. Resources placed in patient's belonging bag at bedside.

## 2016-04-23 NOTE — ED Provider Notes (Signed)
Pt is a 82 y.o. female who presents with  Chief Complaint  Patient presents with  . Cough  Patient presents to the emergency room with complaints of cough and confusion. According to her nephew the patient usually is able to care for herself and has no issues with confusion. She lives at home. She often has trouble when she has an infection. She has been coughing recently.  Physical Exam  Constitutional: No distress.  elderly  HENT:  Head: Normocephalic and atraumatic.  Eyes: Conjunctivae are normal. Left eye exhibits no discharge. No scleral icterus.  Neck: No tracheal deviation present. No thyromegaly present.  Pulmonary/Chest: Effort normal and breath sounds normal. No stridor.  Abdominal: She exhibits no distension.  Musculoskeletal: She exhibits no tenderness or deformity.  Neurological: She is alert. Cranial nerve deficit: no facial droop.  Skin: Skin is warm. No rash noted. She is not diaphoretic. No erythema.  Psychiatric:  Confused, some paranoid delusions   MDM Patient appears to have a possible left lower lobe pneumonia..  This may be the cause of the acute confusion area and will plan on IV antibiotics. CT scan pending to rule out any acute abnormalities.   Left lower lobe pneumonia  Confusion  Cough   Medical screening examination/treatment/procedure(s) were conducted as a shared visit with non-physician practitioner(s) and myself.  I personally evaluated the patient during the encounter.        Linwood DibblesJon Hager [place], MD 04/23/16 [phone]

## 2016-04-23 NOTE — ED Provider Notes (Signed)
[facility]-EMERGENCY DEPT Provider Note   CSN: [id number] Arrival date & time: 04/23/16  1326     History   Chief Complaint Chief Complaint  Patient presents with  . Cough    HPI [name] is a 82 y.o. female.  Patient with a h/o HTN, HLD, COPD who presented with complaints of cough. Reportedly EMS was called because "patient was not feeling well." Patient is really albuterol nebulizers and en route to ED. Vital signs are stable. Patient is confused and oriented only to self on exam. She reports that she is "in a place where they tested for AIDS." Patient does endorse a cough which is productive of white sputum. She reports that she has had some minor difficulty with breathing over the last couple days. She denies any fevers or chills. Patient is in no acute distress, no family present at the bedside for collateral history. It is unclear what the patient's baseline mental status is. Patient has been frequently seen in the emergency department of the last several months. It appears that she has some baseline of dementia but has not been formally diagnosed.  S/w pt's nephew who reports that she was reportedly in her normal state of health this morning and was not confused. She became acutely SOB and home breathing treatments were not effective. She became confused at that time and EMS was called for transport to the ED. Nephew notes that she was recently seen in the ED for COPD exacerbation and Rx'd doxy / pred. He reports good compliance with these medications and states that she has not had any adverse reactions from them.      Past Medical History:  Diagnosis Date  . Arthritis   . Chronic bronchitis (HCC)   . COPD (chronic obstructive pulmonary disease) (HCC)   . GERD (gastroesophageal reflux disease)   . Gout   . H/O hiatal hernia   . High cholesterol   . Hypertension     Patient Active Problem List   Diagnosis Date Noted  . Failure to thrive in adult 04/15/2016  . Acute  encephalopathy 04/15/2016  . GERD (gastroesophageal reflux disease) 04/15/2016  . Orthostatic hypotension 03/18/2016  . Orthostatic dizziness 03/18/2016  . Postinflammatory pulmonary fibrosis (HCC) 04/29/2015  . Dyspnea 04/26/2015  . Severe malnutrition (HCC) 02/23/2015  . AKI (acute kidney injury) (HCC) 02/22/2015  . Arthritis 02/22/2015  . Weakness   . Dehydration 02/21/2015  . ARF (acute renal failure) (HCC) 10/03/2014  . Underweight 07/20/2014  . Acute bronchitis 07/20/2014  . COPD GOLD I with reversibility  07/18/2014  . Essential hypertension 07/18/2014  . Gout 07/18/2014  . SBO (small bowel obstruction) (HCC) 01/14/2014  . Hypercalcemia 01/14/2014  . Protein-calorie malnutrition, severe (HCC) 10/10/2013  . Acute respiratory failure (HCC) 10/07/2013  . Hypokalemia 10/07/2013  . H. influenzae septicemia (HCC) 12/06/2012  . Leukocytopenia, unspecified 12/03/2012  . HTN (hypertension) 12/02/2012  . Gouty arthritis 12/02/2012    Past Surgical History:  Procedure Laterality Date  . APPENDECTOMY  1945  . CATARACT EXTRACTION W/ INTRAOCULAR LENS  IMPLANT, BILATERAL Bilateral   . FRACTURE SURGERY    . HIP FRACTURE SURGERY     "don't remember which side"  . TEE WITHOUT CARDIOVERSION N/A 10/13/2013   Procedure: TRANSESOPHAGEAL ECHOCARDIOGRAM (TEE);  Surgeon: Quintella Reichert, MD;  Location: [facility] ENDOSCOPY;  Service: Cardiovascular;  Laterality: N/A;    OB History    No data available       Home Medications    Prior to  Admission medications   Medication Sig Start Date End Date Taking? Authorizing Provider  acetaminophen (TYLENOL) 325 MG tablet Take 2 tablets (650 mg total) by mouth every 6 (six) hours as needed for mild pain (or Fever >/= 101). 03/21/16   Belkys A Regalado, MD  albuterol (PROVENTIL HFA;VENTOLIN HFA) 108 (90 BASE) MCG/ACT inhaler Inhale 2 puffs into the lungs every 4 (four) hours as needed for wheezing or shortness of breath. Patient taking differently: Inhale 2  puffs into the lungs every 4 (four) hours as needed for shortness of breath (wheezing and shortness of breath ((PLANB))).  11/28/14   Azalia BilisKevin Campos, MD  allopurinol (ZYLOPRIM) 300 MG tablet Take 300 mg by mouth every morning.    Historical Provider, MD  atenolol (TENORMIN) 25 MG tablet Take 25 mg by mouth daily.     Historical Provider, MD  atorvastatin (LIPITOR) 20 MG tablet Take 20 mg by mouth at bedtime.    Historical Provider, MD  budesonide (PULMICORT) 0.25 MG/2ML nebulizer solution Take 2 mLs (0.25 mg total) by nebulization 2 (two) times daily. 10/12/15   Nyoka CowdenMichael B Wert, MD  chlorpheniramine-HYDROcodone (TUSSIONEX) 10-8 MG/5ML SUER Take 5 mLs by mouth 2 (two) times daily as needed for cough. 04/15/16   Historical Provider, MD  donepezil (ARICEPT) 5 MG tablet Take 1 tablet (5 mg total) by mouth at bedtime. 04/16/16   Rolly SalterPranav M Patel, MD  doxycycline (VIBRAMYCIN) 100 MG capsule Take 1 capsule (100 mg total) by mouth 2 (two) times daily. 04/18/16   Ace GinsSerena Y Sam, PA-C  famotidine (PEPCID) 20 MG tablet Take 20 mg by mouth at bedtime. 10/19/15   Historical Provider, MD  ipratropium-albuterol (DUONEB) 0.5-2.5 (3) MG/3ML SOLN Take 3 mLs by nebulization every 6 (six) hours as needed. Patient taking differently: Take 3 mLs by nebulization every 4 (four) hours as needed (shortness of breath/ wheezing).  11/28/14   Azalia BilisKevin Campos, MD  lactose free nutrition (BOOST) LIQD Take 237 mLs by mouth 3 (three) times daily between meals.    Historical Provider, MD  meclizine (ANTIVERT) 25 MG tablet Take 0.5 tablets (12.5 mg total) by mouth 3 (three) times daily as needed for dizziness. 01/05/16   Arby BarretteMarcy Pfeiffer, MD  pantoprazole (PROTONIX) 40 MG tablet TAKE 1 TABLET BY MOUTH EVERY DAY 30-60 MINUTES BEFORE FIRST MEAL OF THE DAY 10/22/15   Nyoka CowdenMichael B Wert, MD  polyethylene glycol [facility](MIRALAX / GLYCOLAX) packet Take 17 g by mouth daily as needed for mild constipation. 04/16/16   Rolly SalterPranav M Patel, MD  predniSONE (DELTASONE) 20 MG tablet Take 2  tablets (40 mg total) by mouth daily. 04/18/16   Carlene CoriaSerena Y Sam, PA-C    Family History Family History  Problem Relation Age of Onset  . Cancer Mother   . Stroke Father   . Cirrhosis Brother     Social History Social History  Substance Use Topics  . Smoking status: Former Smoker    Packs/day: 0.50    Years: 70.00    Quit date: 08/18/2008  . Smokeless tobacco: Former NeurosurgeonUser    Quit date: 10/13/2004  . Alcohol use No     Allergies   Penicillins   Review of Systems Review of Systems  Unable to perform ROS: Mental status change     Physical Exam Updated Vital Signs BP 104/56   Pulse 94   Temp 98.5 F (36.9 C)   SpO2 94%   Physical Exam  Constitutional: No distress.  HENT:  Head: Normocephalic and atraumatic.  Mouth/Throat: Oropharynx is  clear and moist. No oropharyngeal exudate.  Eyes: Conjunctivae are normal. Pupils are equal, round, and reactive to light.  Neck: Neck supple.  Cardiovascular: Normal rate, regular rhythm, normal heart sounds and intact distal pulses.   No murmur heard. Pulmonary/Chest: Effort normal and breath sounds normal. No respiratory distress. She has no wheezes. She has no rales.  Abdominal: Soft. There is no tenderness.  Musculoskeletal: She exhibits no edema or tenderness.  Neurological: She is alert. She is disoriented (Oriented to person, city, and year, not to hospital). She displays atrophy (generalized weakness, but symmetric in all extremities). No cranial nerve deficit. She displays a negative Romberg sign. Coordination normal.  Skin: Skin is warm and dry. She is not diaphoretic.  Psychiatric: She has a normal mood and affect. Cognition and memory are impaired.  Nursing note and vitals reviewed.  ED Treatments / Results  Labs (all labs ordered are listed, but only abnormal results are displayed) Labs Reviewed  CBC WITH DIFFERENTIAL/PLATELET - Abnormal; Notable for the following:       Result Value   Hemoglobin 11.3 (*)    HCT 35.4  (*)    All other components within normal limits  BASIC METABOLIC PANEL - Abnormal; Notable for the following:    Potassium 3.2 (*)    Glucose, Bld 142 (*)    Creatinine, Ser 1.04 (*)    GFR calc non Af Amer 46 (*)    GFR calc Af Amer 53 (*)    Anion gap 4 (*)    All other components within normal limits  PROTIME-INR  URINALYSIS, ROUTINE W REFLEX MICROSCOPIC (NOT AT [facility])    EKG  EKG Interpretation None       Radiology Dg Chest 1 View  Result Date: 04/23/2016 CLINICAL DATA:  Cough EXAM: CHEST 1 VIEW COMPARISON:  04/18/2016 FINDINGS: There is hyperinflation of the lungs compatible with COPD. Left lower lobe atelectasis or infiltrate with small left pleural effusion. No confluent opacity on the right. Heart is borderline in size. No acute bony abnormality. IMPRESSION: COPD. Left lower lobe atelectasis or pneumonia with small left effusion. Electronically Signed   By: Charlett Nose M.D.   On: 04/23/2016 14:29    Procedures Procedures (including critical care time)  Medications Ordered in ED Medications  potassium chloride SA (K-DUR,KLOR-CON) CR tablet 40 mEq (not administered)  azithromycin (ZITHROMAX) 500 mg in dextrose 5 % 250 mL IVPB (not administered)  cefTRIAXone (ROCEPHIN) 1 g in dextrose 5 % 50 mL IVPB (not administered)    Initial Impression / Assessment and Plan / ED Course  I have reviewed the triage vital signs and the nursing notes.  Pertinent labs & imaging results that were available during my care of the patient were reviewed by me and considered in my medical decision making (see chart for details).  Clinical Course  Value Comment By Time   Pt seen and evaluated. NAD. Pleasantly confused. Unable to participate in hx, but does endorse a cough. Will plan CXR and CBC to r/o infection. Carolynn Comment, MD 09/06 1412   Pt confused but oriented to self, year, and city. She is wearing paper scrubs from prior ED visit and nursing has concerns that she may not have  appropriate care at home given baseline confusion and likely dementia. Will contact Social Work for further evaluation. Carolynn Comment, MD 09/06 1416   Was able to contact nephew who is primary caretaker for patient. He reports that she became acutely SOB this AM and did not  respond to breathing treatments. He notes that she became confused at that time and that her MS waxes and wanes at baseline, but she is typically oriented. Carolynn Comment, MD 09/06 1429  Hemoglobin: (!) 11.3 (Reviewed) Carolynn Comment, MD 09/06 1504  WBC: 7.2 Stable normocytic anemia w/o leukocytosis. Carolynn Comment, MD 09/06 1504   Per pt's nephew, AMS is new. Will broaden w/u to include ddx of metabolic encephalopathy and CVA w/ BMP and CT head. No focal deficits on exam. Carolynn Comment, MD 09/06 1505  Potassium: (!) 3.2 Repleted K w/ Carolynn Comment, MD 09/06 1559  DG Chest 1 View CXR w/ suspicions of left lower lobe PNA. Will start empiric Rocephin/Azithro. Plan to admit for obs pending head CT. Carolynn Comment, MD 09/06 1605   Will await head CT and UA. Plan to admit to hospitalist for Obs given suspected CAP.  Final Clinical Impressions(s) / ED Diagnoses   Final diagnoses:  Confusion  Cough  Left lower lobe pneumonia    New Prescriptions New Prescriptions   No medications on file     Carolynn Comment, MD 04/23/16 1607    Linwood Dibbles, MD 04/24/16 [phone]

## 2016-04-23 NOTE — ED Notes (Signed)
Pt cannot use restroom at this time, aware urine specimen is needed.  

## 2016-04-23 NOTE — ED Notes (Signed)
This RN called nephew again who stated he would be on his way here

## 2016-04-23 NOTE — ED Provider Notes (Addendum)
Patient signed out to me as pending CT head for evaluation of AMS.  She was noted to be confused by her son and by ED staff.  She had a cough and SOB with possible pneumonia in the LLL.  In this setting, patient was treated with ceftriaxone and azithromycin.    4:50 PM CT head has come back negative for injury.  Will admit to hospitalist as previously planned.   Tomasita CrumbleAdeleke Ikeem Cleckler, MD 04/23/16 1650    5:05 PM After discussing with hospitalist, patient has been admitted for acute encephalopathy 2 times in the last month.  This would be the 3rd time.  Per nursing, patient has on the same paper scrubs from this facility.  Case management has been consulted to help with problems at home.  Will likely DC with 4 more days of azithro.  VS all remain normal.   Tomasita CrumbleAdeleke Toccara Alford, MD 04/24/16 2204

## 2016-04-23 NOTE — ED Notes (Signed)
Pt's emergency contact (nephew [name]) was called and told that pt will be ready for discharge after she receives antibiotics

## 2016-04-23 NOTE — Progress Notes (Signed)
CSW spoke with patients nephew, [name] [phone], regarding patient returning to hospital after recently being discharged and returning in a hospital gown. Patients nephew states that "she has dementia and will find clothes we throw away". Patients nephew stated his girlfriend and himself help bathe/change patient every day. Patients nephew is able to pick up once stable for discharge.   Stacy GardnerErin Lenardo Westwood, LCSWA Clinical Social Worker [phone]

## 2016-04-23 NOTE — ED Triage Notes (Signed)
Per EMS, pt from home.  Pt here with cough x "unknown" time.  EMS called out b/c "pt not feeling well".  Albuterol given in route.  No fever noted.  Vitals:  108/64, hr 78, resp 18, 93% ra

## 2016-04-24 DIAGNOSIS — M1990 Unspecified osteoarthritis, unspecified site: Secondary | ICD-10-CM | POA: Diagnosis not present

## 2016-04-24 DIAGNOSIS — E785 Hyperlipidemia, unspecified: Secondary | ICD-10-CM | POA: Diagnosis not present

## 2016-04-24 DIAGNOSIS — E43 Unspecified severe protein-calorie malnutrition: Secondary | ICD-10-CM | POA: Diagnosis not present

## 2016-04-24 DIAGNOSIS — I480 Paroxysmal atrial fibrillation: Secondary | ICD-10-CM | POA: Diagnosis not present

## 2016-04-24 DIAGNOSIS — I10 Essential (primary) hypertension: Secondary | ICD-10-CM | POA: Diagnosis not present

## 2016-04-24 DIAGNOSIS — J441 Chronic obstructive pulmonary disease with (acute) exacerbation: Secondary | ICD-10-CM | POA: Diagnosis not present

## 2016-04-24 DIAGNOSIS — R2681 Unsteadiness on feet: Secondary | ICD-10-CM | POA: Diagnosis not present

## 2016-04-24 DIAGNOSIS — M109 Gout, unspecified: Secondary | ICD-10-CM | POA: Diagnosis not present

## 2016-04-24 DIAGNOSIS — I2510 Atherosclerotic heart disease of native coronary artery without angina pectoris: Secondary | ICD-10-CM | POA: Diagnosis not present

## 2016-04-25 ENCOUNTER — Other Ambulatory Visit: Payer: Self-pay | Admitting: *Deleted

## 2016-04-25 NOTE — Patient Outreach (Signed)
[facility] Care Management  04/25/2016  Lindsay HammingCallie B Sargent 09/23/1922 [id number]   Referral received from ED Case Manager, A. Ferrero as member has had multiple ED visits in the past 6 months.  According to chart, Ms. Ferrero expressed concern management of member's care in the home.  She lives with her nephew, who does not express interest in placement to Assisted Living facility.  Nephew has been provided with resources for private duty nursing.  According to chart, she has history of dementia as well as hypertension, COPD, GERD, and pulmonary fibrosis.  Call placed to nephew, [name], to follow up on concerns addressed at the ED.  He verifies member's identity.  He state that there is "nothing really" that he is concerned about regarding member's health management with the exception of providing more support/relief for him.  He state that he is also having a hard time getting the member to take her medications.  He again state that assisted living facility is not an option, but he is interested in other options.  This care manager introduced the idea of adult day programs, such as [facility].  He state he is interested, referral placed to Child psychotherapistsocial worker.  The member is [facility] at [facility] time [facility] and [facility].    He denies any further needs at this time but does agree to initial home visit.  Visit scheduled for within the next 2 weeks, will at that time develop patient specific care plan once initial assessment has been completed.  He is provided with contact information for this care manager, advised to contact with questions.  Kemper DurieMonica Rosio [name], [place]RN, MSN [facility] Care [facility]  [facility] Care Manager [phone]

## 2016-04-26 ENCOUNTER — Emergency Department (HOSPITAL_COMMUNITY)
Admission: EM | Admit: 2016-04-26 | Discharge: 2016-04-26 | Disposition: A | Discharge status: Home / Self Care | Payer: Commercial Managed Care - HMO | Attending: Emergency Medicine | Admitting: Emergency Medicine

## 2016-04-26 DIAGNOSIS — I10 Essential (primary) hypertension: Secondary | ICD-10-CM | POA: Insufficient documentation

## 2016-04-26 DIAGNOSIS — Z87891 Personal history of nicotine dependence: Secondary | ICD-10-CM | POA: Diagnosis not present

## 2016-04-26 DIAGNOSIS — R4182 Altered mental status, unspecified: Secondary | ICD-10-CM | POA: Insufficient documentation

## 2016-04-26 DIAGNOSIS — T50901A Poisoning by unspecified drugs, medicaments and biological substances, accidental (unintentional), initial encounter: Secondary | ICD-10-CM | POA: Diagnosis not present

## 2016-04-26 DIAGNOSIS — J449 Chronic obstructive pulmonary disease, unspecified: Secondary | ICD-10-CM | POA: Insufficient documentation

## 2016-04-26 NOTE — ED Provider Notes (Signed)
[facility]-EMERGENCY DEPT Provider Note   CSN: [id number] Arrival date & time: 04/26/16  1207  By signing my name below, I, Sonum Patel, attest that this documentation has been prepared under the direction and in the presence of Raeford RazorStephen Montae Stager, MD. Electronically Signed: Sonum Patel, Neurosurgeoncribe. 04/26/16. 1:53 PM.  History   Chief Complaint Chief Complaint  Patient presents with  . Medication Reaction  . Altered Mental Status    The history is provided by the patient. The history is limited by the condition of the patient. No language interpreter was used.    LEVEL 5 CAVEAT - Altered Mental Status, poor historian  HPI Comments: [name] is a 82 y.o. female who presents to the Emergency Department with altered mental status and confusion today. Patient states she lives alone but her nephew Jillyn Hidden([name]) sometimes checks on her and helps with her medications. She states she mostly completes ADL's on her own. She states she was brought here by [name] but is unsure if he is here right now.    Past Medical History:  Diagnosis Date  . Arthritis   . Chronic bronchitis (HCC)   . COPD (chronic obstructive pulmonary disease) (HCC)   . GERD (gastroesophageal reflux disease)   . Gout   . H/O hiatal hernia   . High cholesterol   . Hypertension     Patient Active Problem List   Diagnosis Date Noted  . Failure to thrive in adult 04/15/2016  . Acute encephalopathy 04/15/2016  . GERD (gastroesophageal reflux disease) 04/15/2016  . Orthostatic hypotension 03/18/2016  . Orthostatic dizziness 03/18/2016  . Postinflammatory pulmonary fibrosis (HCC) 04/29/2015  . Dyspnea 04/26/2015  . Severe malnutrition (HCC) 02/23/2015  . AKI (acute kidney injury) (HCC) 02/22/2015  . Arthritis 02/22/2015  . Weakness   . Dehydration 02/21/2015  . ARF (acute renal failure) (HCC) 10/03/2014  . Underweight 07/20/2014  . Acute bronchitis 07/20/2014  . COPD GOLD I with reversibility  07/18/2014  . Essential hypertension  07/18/2014  . Gout 07/18/2014  . SBO (small bowel obstruction) (HCC) 01/14/2014  . Hypercalcemia 01/14/2014  . Protein-calorie malnutrition, severe (HCC) 10/10/2013  . Acute respiratory failure (HCC) 10/07/2013  . Hypokalemia 10/07/2013  . H. influenzae septicemia (HCC) 12/06/2012  . Leukocytopenia, unspecified 12/03/2012  . HTN (hypertension) 12/02/2012  . Gouty arthritis 12/02/2012    Past Surgical History:  Procedure Laterality Date  . APPENDECTOMY  1945  . CATARACT EXTRACTION W/ INTRAOCULAR LENS  IMPLANT, BILATERAL Bilateral   . FRACTURE SURGERY    . HIP FRACTURE SURGERY     "don't remember which side"  . TEE WITHOUT CARDIOVERSION N/A 10/13/2013   Procedure: TRANSESOPHAGEAL ECHOCARDIOGRAM (TEE);  Surgeon: Quintella Reichertraci R Turner, MD;  [facility];  Service: Cardiovascular;  Laterality: N/A;    OB History    No data available       Home Medications    Prior to Admission medications   Medication Sig Start Date End Date Taking? Authorizing Provider  acetaminophen (TYLENOL) 325 MG tablet Take 2 tablets (650 mg total) by mouth every 6 (six) hours as needed for mild pain (or Fever >/= 101). 03/21/16   Belkys A Regalado, MD  albuterol (PROVENTIL HFA;VENTOLIN HFA) 108 (90 BASE) MCG/ACT inhaler Inhale 2 puffs into the lungs every 4 (four) hours as needed for wheezing or shortness of breath. Patient taking differently: Inhale 2 puffs into the lungs every 4 (four) hours as needed for shortness of breath (wheezing and shortness of breath ((PLANB))).  11/28/14  Azalia Bilis, MD  allopurinol (ZYLOPRIM) 300 MG tablet Take 300 mg by mouth every morning.    Historical Provider, MD  atenolol (TENORMIN) 25 MG tablet Take 25 mg by mouth daily.     Historical Provider, MD  atorvastatin (LIPITOR) 20 MG tablet Take 20 mg by mouth at bedtime.    Historical Provider, MD  azithromycin (ZITHROMAX) 250 MG tablet Take 1 tablet (250 mg total) by mouth daily. 04/23/16   Tomasita Crumble, MD  budesonide  (PULMICORT) 0.25 MG/2ML nebulizer solution Take 2 mLs (0.25 mg total) by nebulization 2 (two) times daily. 10/12/15   Nyoka Cowden, MD  chlorpheniramine-HYDROcodone (TUSSIONEX) 10-8 MG/5ML SUER Take 5 mLs by mouth 2 (two) times daily as needed for cough. 04/15/16   Historical Provider, MD  donepezil (ARICEPT) 5 MG tablet Take 1 tablet (5 mg total) by mouth at bedtime. 04/16/16   Rolly Salter, MD  doxycycline (VIBRAMYCIN) 100 MG capsule Take 1 capsule (100 mg total) by mouth 2 (two) times daily. 04/18/16   Ace Gins Sam, PA-C  famotidine (PEPCID) 20 MG tablet Take 20 mg by mouth at bedtime. 10/19/15   Historical Provider, MD  ipratropium-albuterol (DUONEB) 0.5-2.5 (3) MG/3ML SOLN Take 3 mLs by nebulization every 6 (six) hours as needed. Patient taking differently: Take 3 mLs by nebulization every 4 (four) hours as needed (shortness of breath/ wheezing).  11/28/14   Azalia Bilis, MD  lactose free nutrition (BOOST) LIQD Take 237 mLs by mouth 3 (three) times daily between meals.    Historical Provider, MD  meclizine (ANTIVERT) 25 MG tablet Take 0.5 tablets (12.5 mg total) by mouth 3 (three) times daily as needed for dizziness. 01/05/16   Arby Barrette, MD  pantoprazole (PROTONIX) 40 MG tablet TAKE 1 TABLET BY MOUTH EVERY DAY 30-60 MINUTES BEFORE FIRST MEAL OF THE DAY 10/22/15   Nyoka Cowden, MD  polyethylene glycol [facility] / GLYCOLAX) packet Take 17 g by mouth daily as needed for mild constipation. 04/16/16   Rolly Salter, MD  predniSONE (DELTASONE) 20 MG tablet Take 2 tablets (40 mg total) by mouth daily. 04/18/16   Carlene Coria, PA-C    Family History Family History  Problem Relation Age of Onset  . Cancer Mother   . Stroke Father   . Cirrhosis Brother     Social History Social History  Substance Use Topics  . Smoking status: Former Smoker    Packs/day: 0.50    Years: 70.00    Quit date: 08/18/2008  . Smokeless tobacco: Former Neurosurgeon    Quit date: 10/13/2004  . Alcohol use No     Allergies     Penicillins   Review of Systems Review of Systems  Unable to perform ROS: Mental status change     Physical Exam Updated Vital Signs BP 107/67   Pulse 70   Temp 97.7 F (36.5 C) (Oral)   Resp 14   SpO2 97%   Physical Exam  Constitutional: She is oriented to person, place, and time. She appears well-developed and well-nourished. No distress.  Pleasant, conversive but gets confused easily when talking about why she is here  HENT:  Head: Normocephalic and atraumatic.  Eyes: EOM are normal.  Neck: Normal range of motion.  Cardiovascular: Normal rate, regular rhythm and normal heart sounds.   Pulmonary/Chest: Effort normal and breath sounds normal.  Abdominal: Soft. She exhibits no distension. There is no tenderness.  Musculoskeletal: Normal range of motion.  Neurological: She is alert and oriented  to person, place, and time.  Skin: Skin is warm and dry.  Psychiatric: She has a normal mood and affect. Judgment normal.  Nursing note and vitals reviewed.    ED Treatments / Results  DIAGNOSTIC STUDIES: Oxygen Saturation is 96% on RA, adequate by my interpretation.    COORDINATION OF CARE:    Labs (all labs ordered are listed, but only abnormal results are displayed) Labs Reviewed - No data to display  EKG  EKG Interpretation None       Radiology No results found.  Procedures Procedures (including critical care time)  Medications Ordered in ED Medications - No data to display   Initial Impression / Assessment and Plan / ED Course  I have reviewed the triage vital signs and the nursing notes.  Pertinent labs & imaging results that were available during my care of the patient were reviewed by me and considered in my medical decision making (see chart for details).  Clinical Course    89yF with concern about mistaking her medications.l she has no acute complaints. She was observed. No change in clinical status. She lives with adult nephew whom she can  return to. It has been determined that no acute conditions requiring further emergency intervention are present at this time. The patient has been advised of the diagnosis and plan. I reviewed any labs and imaging including any potential incidental findings. We have discussed signs and symptoms that warrant return to the ED and they are listed in the discharge instructions.    Final Clinical Impressions(s) / ED Diagnoses   Final diagnoses:  Accidental medication error    New Prescriptions New Prescriptions   No medications on file   I personally preformed the services scribed in my presence. The recorded information has been reviewed is accurate. Raeford Razor, MD.    Raeford Razor, MD 04/30/16 [phone]

## 2016-04-26 NOTE — ED Triage Notes (Signed)
Pt. Is alert and oriented to self and time,.  She is talking about forgetting things and she thinks she may have taken the wron g medication.  She is not sure.  Unable to find her grandson to talk with her. Pt. Denies any pain or discomfort.  She has a congested cough and reports having a cold.

## 2016-04-26 NOTE — ED Notes (Signed)
Notified nephew that she is ready for discharge; he stated he would come and get her soon.
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
## 2016-04-26 NOTE — ED Notes (Signed)
Nephew arrived; patient and nephew arguing. She was yelling she did not want to go with him to the house. He stated she was still confused about the old house they used to live in (that she has tried to go back there so much, the new residents took out an order to keep her away). They both were frustrated and yelling at each other. Nephew stated he was leaving and was not taking her with him; followed him into hall and he was tearful stating taking care of her is very frustrating and he needs a break. Asked him if he wanted to speak with case management about placement for her and he stated no, he wants her with him. Discussed staying calm with her and understanding her confusion. Went back in and talked to patient again and she agreed to go with him. Both were calm. Patient taken outside in wheelchair and left with him to wait for their ride to return.
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
## 2016-04-26 NOTE — ED Notes (Signed)
Called nephew again for update on time frame of him coming to get her. He stated he would be here about 6 p.m.

## 2016-04-28 ENCOUNTER — Encounter: Payer: Self-pay | Admitting: *Deleted

## 2016-04-28 LAB — CULTURE, BLOOD (ROUTINE X 2): CULTURE: NO GROWTH

## 2016-04-29 ENCOUNTER — Other Ambulatory Visit: Payer: Self-pay | Admitting: *Deleted

## 2016-04-29 NOTE — Patient Outreach (Signed)
[facility] Care Management  04/29/2016  Katha HammingCallie B [name] 04/09/1923 [id number]   CSW received a new referral on patient from Kemper DurieMonica Lane, [facility]RNCM with [facility], indicating that patient would benefit from social work services and resources to assist with home care services, as well as adult day care programs.  Mrs. Maurice MarchLane reports that patient has Dementia and family members would like to receive some additional assistance in the home.  Patient is not interested in long-term care placement into an assisted living facility.  Patient nephew, and primary caregiver, [name] is interested in completing patient's Advanced Directives (Living Will and [facility] Power of Bol[place]Attorney documents). CSW made an initial attempt to try and contact patient today to perform phone assessment, as well as assess and assist with social needs and services, without success.  A HIPAA complaint message was left for patient on voicemail.  CSW is currently awaiting a return call.  CSW will make another attempt to try and contact patient again in one week. Danford BadJoanna Saporito, BSW, MSW, LCSW  Licensed Restaurant manager, fast foodClinical Social Worker  [facility] [place] [facility]  Mailing [place] [address], [place], [place] [address] Physical Address-[address], [place], [place] [address] Toll Free Main # [phone] Fax # [phone] Cell # [phone]  Fax # [phone]  [email or web address]

## 2016-04-30 DIAGNOSIS — R2681 Unsteadiness on feet: Secondary | ICD-10-CM | POA: Diagnosis not present

## 2016-04-30 DIAGNOSIS — M109 Gout, unspecified: Secondary | ICD-10-CM | POA: Diagnosis not present

## 2016-04-30 DIAGNOSIS — E785 Hyperlipidemia, unspecified: Secondary | ICD-10-CM | POA: Diagnosis not present

## 2016-04-30 DIAGNOSIS — E43 Unspecified severe protein-calorie malnutrition: Secondary | ICD-10-CM | POA: Diagnosis not present

## 2016-04-30 DIAGNOSIS — I480 Paroxysmal atrial fibrillation: Secondary | ICD-10-CM | POA: Diagnosis not present

## 2016-04-30 DIAGNOSIS — J441 Chronic obstructive pulmonary disease with (acute) exacerbation: Secondary | ICD-10-CM | POA: Diagnosis not present

## 2016-04-30 DIAGNOSIS — M1990 Unspecified osteoarthritis, unspecified site: Secondary | ICD-10-CM | POA: Diagnosis not present

## 2016-04-30 DIAGNOSIS — I10 Essential (primary) hypertension: Secondary | ICD-10-CM | POA: Diagnosis not present

## 2016-04-30 DIAGNOSIS — I2510 Atherosclerotic heart disease of native coronary artery without angina pectoris: Secondary | ICD-10-CM | POA: Diagnosis not present

## 2016-05-02 DIAGNOSIS — J441 Chronic obstructive pulmonary disease with (acute) exacerbation: Secondary | ICD-10-CM | POA: Diagnosis not present

## 2016-05-02 DIAGNOSIS — I10 Essential (primary) hypertension: Secondary | ICD-10-CM | POA: Diagnosis not present

## 2016-05-02 DIAGNOSIS — R0602 Shortness of breath: Secondary | ICD-10-CM | POA: Diagnosis not present

## 2016-05-02 DIAGNOSIS — R2681 Unsteadiness on feet: Secondary | ICD-10-CM | POA: Diagnosis not present

## 2016-05-02 DIAGNOSIS — I2510 Atherosclerotic heart disease of native coronary artery without angina pectoris: Secondary | ICD-10-CM | POA: Diagnosis not present

## 2016-05-02 DIAGNOSIS — M1990 Unspecified osteoarthritis, unspecified site: Secondary | ICD-10-CM | POA: Diagnosis not present

## 2016-05-02 DIAGNOSIS — E43 Unspecified severe protein-calorie malnutrition: Secondary | ICD-10-CM | POA: Diagnosis not present

## 2016-05-02 DIAGNOSIS — E785 Hyperlipidemia, unspecified: Secondary | ICD-10-CM | POA: Diagnosis not present

## 2016-05-02 DIAGNOSIS — I480 Paroxysmal atrial fibrillation: Secondary | ICD-10-CM | POA: Diagnosis not present

## 2016-05-02 DIAGNOSIS — M109 Gout, unspecified: Secondary | ICD-10-CM | POA: Diagnosis not present

## 2016-05-03 ENCOUNTER — Encounter (HOSPITAL_COMMUNITY): Payer: Self-pay | Admitting: Emergency Medicine

## 2016-05-03 ENCOUNTER — Emergency Department (HOSPITAL_COMMUNITY): Payer: Commercial Managed Care - HMO

## 2016-05-03 ENCOUNTER — Emergency Department (HOSPITAL_COMMUNITY)
Admission: EM | Admit: 2016-05-03 | Discharge: 2016-05-04 | Disposition: A | Discharge status: Home / Self Care | Payer: Commercial Managed Care - HMO | Attending: Emergency Medicine | Admitting: Emergency Medicine

## 2016-05-03 DIAGNOSIS — I10 Essential (primary) hypertension: Secondary | ICD-10-CM | POA: Diagnosis not present

## 2016-05-03 DIAGNOSIS — J449 Chronic obstructive pulmonary disease, unspecified: Secondary | ICD-10-CM | POA: Diagnosis not present

## 2016-05-03 DIAGNOSIS — R0602 Shortness of breath: Secondary | ICD-10-CM | POA: Diagnosis not present

## 2016-05-03 DIAGNOSIS — R4182 Altered mental status, unspecified: Secondary | ICD-10-CM | POA: Diagnosis not present

## 2016-05-03 DIAGNOSIS — Z87891 Personal history of nicotine dependence: Secondary | ICD-10-CM | POA: Diagnosis not present

## 2016-05-03 DIAGNOSIS — Z79899 Other long term (current) drug therapy: Secondary | ICD-10-CM | POA: Diagnosis not present

## 2016-05-03 DIAGNOSIS — J40 Bronchitis, not specified as acute or chronic: Secondary | ICD-10-CM

## 2016-05-03 LAB — URINE MICROSCOPIC-ADD ON: RBC / HPF: NONE SEEN RBC/hpf (ref 0–5)

## 2016-05-03 LAB — URINALYSIS, ROUTINE W REFLEX MICROSCOPIC
GLUCOSE, UA: NEGATIVE mg/dL
HGB URINE DIPSTICK: NEGATIVE
Ketones, ur: NEGATIVE mg/dL
Nitrite: NEGATIVE
PH: 5.5 (ref 5.0–8.0)
PROTEIN: NEGATIVE mg/dL
Specific Gravity, Urine: 1.023 (ref 1.005–1.030)

## 2016-05-03 LAB — BASIC METABOLIC PANEL
ANION GAP: 8 (ref 5–15)
BUN: 29 mg/dL — AB (ref 6–20)
CALCIUM: 9.8 mg/dL (ref 8.9–10.3)
CHLORIDE: 109 mmol/L (ref 101–111)
CO2: 25 mmol/L (ref 22–32)
Creatinine, Ser: 1.13 mg/dL — ABNORMAL HIGH (ref 0.44–1.00)
GFR calc Af Amer: 48 mL/min — ABNORMAL LOW (ref >60)
GFR, EST NON AFRICAN AMERICAN: 41 mL/min — AB (ref >60)
GLUCOSE: 105 mg/dL — AB (ref 65–99)
Potassium: 3.8 mmol/L (ref 3.5–5.1)
SODIUM: 142 mmol/L (ref 135–145)

## 2016-05-03 LAB — CBC
HCT: 34.9 % — ABNORMAL LOW (ref 36.0–46.0)
HEMOGLOBIN: 11.4 g/dL — AB (ref 12.0–15.0)
MCH: 28.4 pg (ref 26.0–34.0)
MCHC: 32.7 g/dL (ref 30.0–36.0)
MCV: 86.8 fL (ref 78.0–100.0)
Platelets: 300 10*3/uL (ref 150–400)
RBC: 4.02 MIL/uL (ref 3.87–5.11)
RDW: 15 % (ref 11.5–15.5)
WBC: 8 10*3/uL (ref 4.0–10.5)

## 2016-05-03 NOTE — ED Notes (Signed)
Bed: WA03 Expected date:  Expected time:  Means of arrival:  Comments: 82yo F sob
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
## 2016-05-04 MED ORDER — IPRATROPIUM-ALBUTEROL 0.5-2.5 (3) MG/3ML IN SOLN: 3.0000 mL | Freq: Four times a day (QID) | RESPIRATORY_TRACT | 0 refills | Status: DC | PRN

## 2016-05-04 MED ORDER — ALBUTEROL SULFATE HFA 108 (90 BASE) MCG/ACT IN AERS: 2.0000 | INHALATION_SPRAY | RESPIRATORY_TRACT | 0 refills | Status: DC | PRN

## 2016-05-04 NOTE — Discharge Instructions (Signed)
Please read and follow all provided instructions.  Your diagnoses today include:  1. Bronchitis    Tests performed today include: Vital signs. See below for your results today.   Medications prescribed:  Take as prescribed   Home care instructions:  Follow any educational materials contained in this packet.  Follow-up instructions: Please follow-up with your primary care provider for further evaluation of symptoms and treatment   Return instructions:  Please return to the Emergency Department if you do not get better, if you get worse, or new symptoms OR  - Fever (temperature greater than 101.26F)  - Bleeding that does not stop with holding pressure to the area    -Severe pain (please note that you may be more sore the day after your accident)  - Chest Pain  - Difficulty breathing  - Severe nausea or vomiting  - Inability to tolerate food and liquids  - Passing out  - Skin becoming red around your wounds  - Change in mental status (confusion or lethargy)  - New numbness or weakness    Please return if you have any other emergent concerns.  Additional Information:  Your vital signs today were: BP 138/79    Pulse 71    Temp 98.1 F (36.7 C) (Oral)    Resp 20    SpO2 95%  If your blood pressure (BP) was elevated above 135/85 this visit, please have this repeated by your doctor within one month. ---------------

## 2016-05-06 ENCOUNTER — Ambulatory Visit: Payer: Self-pay | Admitting: *Deleted

## 2016-05-06 ENCOUNTER — Other Ambulatory Visit: Payer: Self-pay | Admitting: *Deleted

## 2016-05-06 NOTE — Patient Outreach (Signed)
[facility] Care Management  05/06/2016  Katha HammingCallie B Yates 02/15/1923 [id number]   Call placed to member's nephew at 0930 to confirm home visit for later this morning, no answer, HIPAA compliant voice message left.  Call placed again at 1030, no answer, another voice message left.  Call received back from nephew, [name], stating that the member was out of town due to death in the family.  He does again state interest in the adult day program, denies any nursing needs.  He is made aware that J. Saporito, LCSW, has been trying to contact him for initial assessment.  He confirms that he has received her messages, but has not had a chance to call back.  Encouraged to contact as soon as possible.  Mrs. Saporito notified of this outreach, provided with alternate contact number for [name], [phone].    This care manager will close case to nursing, remains open to social work.  Mrs. Saporito aware of no nursing needs, request made to close case after social work needs addressed.  Kemper DurieMonica Andreina Outten, [place]RN, MSN [facility]  [facility] Care Manager [phone]

## 2016-05-06 NOTE — Patient Outreach (Signed)
[facility] Care Management  05/06/2016  Katha HammingCallie B [name] 06/03/1923 [id number]    CSW made a second attempt to try and contact patient and patient's nephew, [name] today to try and perform phone assessment, as well as assess and assist with social work needs and services, without success.  A HIPAA compliant message was left for [name] on both contact numbers provided.  CSW is currently awaiting a return call.  CSW will make a third outreach attempt in one week, if CSW does not receive a return call in the meantime.  CSW will also mail [name] a packet of resource information, including Adult Day Care Programs ([facility] and [facility] - [facility]-Inclusive Care for the Elderly).  [name] is not interested in placement for patient at this time.  Patient is not an appropriate candidate to complete Advanced Directives (Living Will and HealthCare Power of Attorney documents) due to documented diagnosis of Dementia. Danford BadJoanna Aniaya Bacha, BSW, MSW, LCSW  Licensed Restaurant manager, fast foodClinical Social Worker  [facility] Management [place] [facility]  Mailing [place]Address-[address], [place], [place] [address] Physical Address-[address], [place], [place] [address] Toll Free Main # [phone] Fax # [phone] Cell # [phone]  Fax # [phone]  [email or web address]

## 2016-05-07 DIAGNOSIS — M1020 Drug-induced gout, unspecified site: Secondary | ICD-10-CM | POA: Diagnosis not present

## 2016-05-07 DIAGNOSIS — I10 Essential (primary) hypertension: Secondary | ICD-10-CM | POA: Diagnosis not present

## 2016-05-07 DIAGNOSIS — E7800 Pure hypercholesterolemia, unspecified: Secondary | ICD-10-CM | POA: Diagnosis not present

## 2016-05-07 DIAGNOSIS — Z23 Encounter for immunization: Secondary | ICD-10-CM | POA: Diagnosis not present

## 2016-05-07 DIAGNOSIS — J449 Chronic obstructive pulmonary disease, unspecified: Secondary | ICD-10-CM | POA: Diagnosis not present

## 2016-05-07 DIAGNOSIS — Z0000 Encounter for general adult medical examination without abnormal findings: Secondary | ICD-10-CM | POA: Diagnosis not present

## 2016-05-12 ENCOUNTER — Emergency Department (HOSPITAL_COMMUNITY): Payer: Commercial Managed Care - HMO

## 2016-05-12 ENCOUNTER — Encounter (HOSPITAL_COMMUNITY): Payer: Self-pay

## 2016-05-12 ENCOUNTER — Emergency Department (HOSPITAL_COMMUNITY)
Admission: EM | Admit: 2016-05-12 | Discharge: 2016-05-13 | Disposition: A | Discharge status: Home / Self Care | Payer: Commercial Managed Care - HMO | Attending: Emergency Medicine | Admitting: Emergency Medicine

## 2016-05-12 DIAGNOSIS — I10 Essential (primary) hypertension: Secondary | ICD-10-CM | POA: Diagnosis not present

## 2016-05-12 DIAGNOSIS — R0602 Shortness of breath: Secondary | ICD-10-CM | POA: Diagnosis not present

## 2016-05-12 DIAGNOSIS — J441 Chronic obstructive pulmonary disease with (acute) exacerbation: Secondary | ICD-10-CM | POA: Diagnosis not present

## 2016-05-12 DIAGNOSIS — Z79899 Other long term (current) drug therapy: Secondary | ICD-10-CM | POA: Diagnosis not present

## 2016-05-12 DIAGNOSIS — Z87891 Personal history of nicotine dependence: Secondary | ICD-10-CM | POA: Insufficient documentation

## 2016-05-12 LAB — CBC WITH DIFFERENTIAL/PLATELET
Basophils Absolute: 0 10*3/uL (ref 0.0–0.1)
Basophils Relative: 1 %
EOS ABS: 0.3 10*3/uL (ref 0.0–0.7)
EOS PCT: 5 %
HCT: 33.5 % — ABNORMAL LOW (ref 36.0–46.0)
Hemoglobin: 11.2 g/dL — ABNORMAL LOW (ref 12.0–15.0)
LYMPHS ABS: 2.7 10*3/uL (ref 0.7–4.0)
Lymphocytes Relative: 42 %
MCH: 28.6 pg (ref 26.0–34.0)
MCHC: 33.4 g/dL (ref 30.0–36.0)
MCV: 85.7 fL (ref 78.0–100.0)
Monocytes Absolute: 0.5 10*3/uL (ref 0.1–1.0)
Monocytes Relative: 8 %
Neutro Abs: 2.9 10*3/uL (ref 1.7–7.7)
Neutrophils Relative %: 44 %
PLATELETS: 237 10*3/uL (ref 150–400)
RBC: 3.91 MIL/uL (ref 3.87–5.11)
RDW: 14.8 % (ref 11.5–15.5)
WBC: 6.4 10*3/uL (ref 4.0–10.5)

## 2016-05-12 LAB — BASIC METABOLIC PANEL
Anion gap: 5 (ref 5–15)
BUN: 23 mg/dL — AB (ref 6–20)
CALCIUM: 9.3 mg/dL (ref 8.9–10.3)
CO2: 27 mmol/L (ref 22–32)
CREATININE: 0.88 mg/dL (ref 0.44–1.00)
Chloride: 108 mmol/L (ref 101–111)
GFR calc Af Amer: >60 mL/min (ref >60)
GFR, EST NON AFRICAN AMERICAN: 56 mL/min — AB (ref >60)
Glucose, Bld: 96 mg/dL (ref 65–99)
Potassium: 4 mmol/L (ref 3.5–5.1)
SODIUM: 140 mmol/L (ref 135–145)

## 2016-05-12 LAB — TROPONIN I: Troponin I: <0.03 ng/mL (ref <0.03)

## 2016-05-12 LAB — BRAIN NATRIURETIC PEPTIDE: B Natriuretic Peptide: 50.4 pg/mL (ref 0.0–100.0)

## 2016-05-12 MED ORDER — ALBUTEROL SULFATE (2.5 MG/3ML) 0.083% IN NEBU: 5.0000 mg | INHALATION_SOLUTION | Freq: Once | RESPIRATORY_TRACT | Status: DC

## 2016-05-12 MED ORDER — PREDNISONE 20 MG PO TABS: 40.0000 mg | ORAL_TABLET | Freq: Every day | ORAL | 0 refills | Status: DC

## 2016-05-12 MED ORDER — METHYLPREDNISOLONE SODIUM SUCC 125 MG IJ SOLR
125.0000 mg | Freq: Once | INTRAMUSCULAR | Status: AC
  Administered 2016-05-12: 125 mg via INTRAVENOUS
  Filled 2016-05-12: qty 2

## 2016-05-12 MED ORDER — ALBUTEROL SULFATE (2.5 MG/3ML) 0.083% IN NEBU
5.0000 mg | INHALATION_SOLUTION | Freq: Once | RESPIRATORY_TRACT | Status: AC
  Administered 2016-05-12: 5 mg via RESPIRATORY_TRACT
  Filled 2016-05-12: qty 6

## 2016-05-12 MED ORDER — AZITHROMYCIN 250 MG PO TABS: 250.0000 mg | ORAL_TABLET | Freq: Every day | ORAL | 0 refills | Status: DC

## 2016-05-12 MED ORDER — SODIUM CHLORIDE 0.9 % IV SOLN
INTRAVENOUS | Status: DC
  Administered 2016-05-12: 21:00:00 via INTRAVENOUS

## 2016-05-12 NOTE — ED Provider Notes (Signed)
[facility]-EMERGENCY DEPT Provider Note   CSN: [id number] Arrival date & time: 05/12/16  1326     History   Chief Complaint Chief Complaint  Patient presents with  . Shortness of Breath    HPI [name] is a 82 y.o. female.  82 year old female presents with cough and increased shortness of breath times several days. Has a history of chronic bronchitis and COPD. Symptoms became worse today. She describes chest tightness was worse with coughing. Denies any leg pain or swelling. No CHF symptoms no anginal symptoms. No fever or chills. The patient to make her symptoms better. No treatment use prior to arrival.      Past Medical History:  Diagnosis Date  . Arthritis   . Chronic bronchitis (HCC)   . COPD (chronic obstructive pulmonary disease) (HCC)   . GERD (gastroesophageal reflux disease)   . Gout   . H/O hiatal hernia   . High cholesterol   . Hypertension     Patient Active Problem List   Diagnosis Date Noted  . Failure to thrive in adult 04/15/2016  . Acute encephalopathy 04/15/2016  . GERD (gastroesophageal reflux disease) 04/15/2016  . Orthostatic hypotension 03/18/2016  . Orthostatic dizziness 03/18/2016  . Postinflammatory pulmonary fibrosis (HCC) 04/29/2015  . Dyspnea 04/26/2015  . Severe malnutrition (HCC) 02/23/2015  . AKI (acute kidney injury) (HCC) 02/22/2015  . Arthritis 02/22/2015  . Weakness   . Dehydration 02/21/2015  . ARF (acute renal failure) (HCC) 10/03/2014  . Underweight 07/20/2014  . Acute bronchitis 07/20/2014  . COPD GOLD I with reversibility  07/18/2014  . Essential hypertension 07/18/2014  . Gout 07/18/2014  . SBO (small bowel obstruction) (HCC) 01/14/2014  . Hypercalcemia 01/14/2014  . Protein-calorie malnutrition, severe (HCC) 10/10/2013  . Acute respiratory failure (HCC) 10/07/2013  . Hypokalemia 10/07/2013  . H. influenzae septicemia (HCC) 12/06/2012  . Leukocytopenia, unspecified 12/03/2012  . HTN (hypertension) 12/02/2012    . Gouty arthritis 12/02/2012    Past Surgical History:  Procedure Laterality Date  . APPENDECTOMY  1945  . CATARACT EXTRACTION W/ INTRAOCULAR LENS  IMPLANT, BILATERAL Bilateral   . FRACTURE SURGERY    . HIP FRACTURE SURGERY     "don't remember which side"  . TEE WITHOUT CARDIOVERSION N/A 10/13/2013   Procedure: TRANSESOPHAGEAL ECHOCARDIOGRAM (TEE);  Surgeon: Quintella Reichert, MD;  Location: [facility] ENDOSCOPY;  Service: Cardiovascular;  Laterality: N/A;    OB History    No data available       Home Medications    Prior to Admission medications   Medication Sig Start Date End Date Taking? Authorizing Provider  acetaminophen (TYLENOL) 325 MG tablet Take 2 tablets (650 mg total) by mouth every 6 (six) hours as needed for mild pain (or Fever >/= 101). 03/21/16   Belkys A Regalado, MD  albuterol (PROVENTIL HFA;VENTOLIN HFA) 108 (90 Base) MCG/ACT inhaler Inhale 2 puffs into the lungs every 4 (four) hours as needed for wheezing or shortness of breath. 05/04/16   Audry Pili, PA-C  allopurinol (ZYLOPRIM) 300 MG tablet Take 450 mg by mouth daily.     Historical Provider, MD  atenolol (TENORMIN) 25 MG tablet Take 25 mg by mouth daily.     Historical Provider, MD  atorvastatin (LIPITOR) 20 MG tablet Take 20 mg by mouth at bedtime.    Historical Provider, MD  azithromycin (ZITHROMAX) 250 MG tablet Take 1 tablet (250 mg total) by mouth daily. 04/23/16   Tomasita Crumble, MD  budesonide (PULMICORT) 0.25 MG/2ML nebulizer solution Take  2 mLs (0.25 mg total) by nebulization 2 (two) times daily. 10/12/15   Nyoka Cowden, MD  chlorpheniramine-HYDROcodone (TUSSIONEX) 10-8 MG/5ML SUER Take 5 mLs by mouth 2 (two) times daily as needed for cough. 04/15/16   Historical Provider, MD  donepezil (ARICEPT) 5 MG tablet Take 1 tablet (5 mg total) by mouth at bedtime. 04/16/16   Rolly Salter, MD  doxycycline (VIBRAMYCIN) 100 MG capsule Take 1 capsule (100 mg total) by mouth 2 (two) times daily. 04/18/16   Ace Gins Sam, PA-C   famotidine (PEPCID) 20 MG tablet Take 20 mg by mouth at bedtime. 10/19/15   Historical Provider, MD  ipratropium-albuterol (DUONEB) 0.5-2.5 (3) MG/3ML SOLN Take 3 mLs by nebulization every 6 (six) hours as needed. 05/04/16   Audry Pili, PA-C  lactose free nutrition (BOOST) LIQD Take 237 mLs by mouth 3 (three) times daily between meals.    Historical Provider, MD  losartan (COZAAR) 100 MG tablet Take 50 mg by mouth daily.    Historical Provider, MD  meclizine (ANTIVERT) 25 MG tablet Take 0.5 tablets (12.5 mg total) by mouth 3 (three) times daily as needed for dizziness. 01/05/16   Arby Barrette, MD  oxyCODONE-acetaminophen (PERCOCET/ROXICET) 5-325 MG tablet Take 1 tablet by mouth 2 (two) times daily as needed for pain. 04/15/16   Historical Provider, MD  pantoprazole (PROTONIX) 40 MG tablet TAKE 1 TABLET BY MOUTH EVERY DAY 30-60 MINUTES BEFORE FIRST MEAL OF THE DAY 10/22/15   Nyoka Cowden, MD  polyethylene glycol [facility] / GLYCOLAX) packet Take 17 g by mouth daily as needed for mild constipation. 04/16/16   Rolly Salter, MD  predniSONE (DELTASONE) 20 MG tablet Take 2 tablets (40 mg total) by mouth daily. 04/18/16   Carlene Coria, PA-C    Family History Family History  Problem Relation Age of Onset  . Cancer Mother   . Stroke Father   . Cirrhosis Brother     Social History Social History  Substance Use Topics  . Smoking status: Former Smoker    Packs/day: 0.50    Years: 70.00    Quit date: 08/18/2008  . Smokeless tobacco: Former Neurosurgeon    Quit date: 10/13/2004  . Alcohol use No     Allergies   Penicillins   Review of Systems Review of Systems  All other systems reviewed and are negative.    Physical Exam Updated Vital Signs BP 144/86 (BP Location: Left Arm)   Pulse 60   Temp 97.6 F (36.4 C) (Oral)   Resp (!) 118   Ht 5\' 4"  (1.626 m)   Wt 40.8 kg   SpO2 96%   BMI 15.45 kg/m   Physical Exam  Constitutional: She is oriented to person, place, and time. She appears  well-developed and well-nourished.  Non-toxic appearance. No distress.  HENT:  Head: Normocephalic and atraumatic.  Eyes: Conjunctivae, EOM and lids are normal. Pupils are equal, round, and reactive to light.  Neck: Normal range of motion. Neck supple. No tracheal deviation present. No thyroid mass present.  Cardiovascular: Normal rate, regular rhythm and normal heart sounds.  Exam reveals no gallop.   No murmur heard. Pulmonary/Chest: Effort normal. No stridor. No respiratory distress. She has no decreased breath sounds. She has wheezes in the right upper field and the left upper field. She has no rhonchi. She has no rales.  Abdominal: Soft. Normal appearance and bowel sounds are normal. She exhibits no distension. There is no tenderness. There is no rebound and  no CVA tenderness.  Musculoskeletal: Normal range of motion. She exhibits no edema or tenderness.  Neurological: She is alert and oriented to person, place, and time. She has normal strength. No cranial nerve deficit or sensory deficit. GCS eye subscore is 4. GCS verbal subscore is 5. GCS motor subscore is 6.  Skin: Skin is warm and dry. No abrasion and no rash noted.  Psychiatric: She has a normal mood and affect. Her speech is normal and behavior is normal.  Nursing note and vitals reviewed.    ED Treatments / Results  Labs (all labs ordered are listed, but only abnormal results are displayed) Labs Reviewed  CBC WITH DIFFERENTIAL/PLATELET  BASIC METABOLIC PANEL  BRAIN NATRIURETIC PEPTIDE  TROPONIN I    EKG  EKG Interpretation  Date/Time:  Monday May 12 2016 21:14:35 EDT Ventricular Rate:  74 PR Interval:    QRS Duration: 79 QT Interval:  421 QTC Calculation: 468 R Axis:   51 Text Interpretation:  Sinus rhythm Ventricular premature complex Left ventricular hypertrophy Confirmed by Freida BusmanALLEN  MD, Mickenzie Stolar ([id number]) on 05/12/2016 11:19:17 PM       Radiology Dg Chest 2 View  Result Date: 05/12/2016 CLINICAL DATA:   Shortness of breath. EXAM: CHEST  2 VIEW COMPARISON:  05/03/2016 .  09/18/2014. FINDINGS: Mediastinum hilar structures are normal. Heart size normal. Diffuse bilateral pulmonary interstitial prominence consistent with chronic interstitial lung disease again noted. Alveolar infiltrate in the left lung base cannot be excluded. Small left pleural effusion cannot be excluded. Similar findings noted on prior exam. Heart size stable. IMPRESSION: Chronic interstitial lung disease. Active pulmonary infiltrate in the left lung base again cannot be excluded. Small left pleural effusion cannot be excluded. No significant change from prior exam. Electronically Signed   By: Maisie Fushomas  Register   On: 05/12/2016 14:28    Procedures Procedures (including critical care time)  Medications Ordered in ED Medications  0.9 %  sodium chloride infusion (not administered)  albuterol (PROVENTIL) (2.5 MG/3ML) 0.083% nebulizer solution 5 mg (not administered)     Initial Impression / Assessment and Plan / ED Course  I have reviewed the triage vital signs and the nursing notes.  Pertinent labs & imaging results that were available during my care of the patient were reviewed by me and considered in my medical decision making (see chart for details).  Clinical Course    Patient given albuterol along with Solu-Medrol. States her breathing feels better. Do not think this represents ACS or PE. Suspect that she have some bronchitis and we'll discharge  Final Clinical Impressions(s) / ED Diagnoses   Final diagnoses:  None    New Prescriptions New Prescriptions   No medications on file     Lorre NickAnthony Reid Regas, MD 05/12/16 [id number]

## 2016-05-12 NOTE — ED Triage Notes (Signed)
Pt states wheezing x 1 week.  Pt denies cough/congestion or fever. Pt able to talk in full sentences.  Feels her ear is stopped up with wax also.

## 2016-05-12 NOTE — ED Notes (Signed)
Person with pt is "like a niece".  She states her caregivers left her.  Niece states she has not taken her meds since 9/16.

## 2016-05-15 ENCOUNTER — Encounter: Payer: Self-pay | Admitting: *Deleted

## 2016-05-15 ENCOUNTER — Other Ambulatory Visit: Payer: Self-pay | Admitting: *Deleted

## 2016-05-15 NOTE — Patient Outreach (Signed)
[facility]) Care Management  05/15/2016  [name] [birth year] [id number]   CSW was able to make initial contact with patient's nephew, [name] today to perform phone assessment, as well as assess and assist with social work needs and services.  CSW introduced self, explained role and types of services provided through [facility] ([place] [facility]).  CSW further explained to Mr. [name] that CSW works with patient's RNCM, also with [place] [facility], [name]. CSW then explained the reason for the call, indicating that Mrs. [name] thought that patient would benefit from social work services and resources to assist with arranging additional home care services, assisting with completion of Advanced Directives ([place] documents) and providing resources for adult day care programs.  CSW obtained two HIPAA compliant identifiers from Mr. [name], which included patient's name and date of birth. Mr. [name] admitted to receiving the packet of resource information that CSW mailed to patient's home, which included all of the following information: Brochure for [facility] ([facility])  Programmer, systems for [employer] ([facility] [place] [facility])  Brochure for [place] for [employer] [facility] [place] for [employer]  Complete listing of all home health agencies offering home care services Advanced Directives (Living Will and [place] documents) Mr. [name] denies needing assistance with any of the above named services, nor does he wish to receive assistance with completion of Advanced Directives.  CSW has provided Mr. [name] with CSW's contact information, encouraging Mr. [name] to contact CSW directly if additional assistance is needed in the near future. CSW will perform a case closure on patient, as all goals of treatment have been  met from social work standpoint and no additional social work needs have been identified at this time.  CSW will notify patient's RNCM with [facility] [place] [facility], [name] of CSW's plans to close patient's case.  CSW will fax an update to patient's Primary Care Physician, Dr. Jani [name] to ensure that they are aware of CSW's involvement with patient's plan of care.  CSW will submit a case closure request to Verlon Setting, Care Management Assistant with [facility], in the form of an [employer].  CSW will ensure that [name] is aware of [name], RNCM with [place], continued involvement with patient's care. Nat Christen, BSW, MSW, LCSW  Licensed Education officer, environmental [facility]  Mailing [place] [address], [place], [place] [address] Physical Address-[address], [place], [place] [address] Toll Free Main # [phone] Fax # [phone] Cell # [phone]  Fax # [phone]  [email or web address]

## 2016-05-19 DIAGNOSIS — R2681 Unsteadiness on feet: Secondary | ICD-10-CM | POA: Diagnosis not present

## 2016-05-19 DIAGNOSIS — E43 Unspecified severe protein-calorie malnutrition: Secondary | ICD-10-CM | POA: Diagnosis not present

## 2016-05-19 DIAGNOSIS — I480 Paroxysmal atrial fibrillation: Secondary | ICD-10-CM | POA: Diagnosis not present

## 2016-05-19 DIAGNOSIS — J441 Chronic obstructive pulmonary disease with (acute) exacerbation: Secondary | ICD-10-CM | POA: Diagnosis not present

## 2016-05-19 NOTE — Patient Outreach (Signed)
[facility] Care Management  05/19/2016  [name] 01/11/1923 [id number]   Request received from Danford BadJoanna Saporito, LCSW to mail patient community resources for [facility] for [employer], [facility], [facility], and [employer]. Information mailed today.    [name], B.A.  [facility] Care Management Assistant

## 2016-05-20 ENCOUNTER — Encounter (HOSPITAL_COMMUNITY): Payer: Self-pay | Admitting: Emergency Medicine

## 2016-05-20 ENCOUNTER — Emergency Department (HOSPITAL_COMMUNITY): Payer: Commercial Managed Care - HMO

## 2016-05-20 ENCOUNTER — Emergency Department (HOSPITAL_COMMUNITY)
Admission: EM | Admit: 2016-05-20 | Discharge: 2016-05-20 | Disposition: A | Discharge status: Home / Self Care | Payer: Commercial Managed Care - HMO | Attending: Emergency Medicine | Admitting: Emergency Medicine

## 2016-05-20 DIAGNOSIS — R0602 Shortness of breath: Secondary | ICD-10-CM | POA: Diagnosis not present

## 2016-05-20 DIAGNOSIS — Z87891 Personal history of nicotine dependence: Secondary | ICD-10-CM | POA: Diagnosis not present

## 2016-05-20 DIAGNOSIS — Z79899 Other long term (current) drug therapy: Secondary | ICD-10-CM | POA: Diagnosis not present

## 2016-05-20 DIAGNOSIS — J441 Chronic obstructive pulmonary disease with (acute) exacerbation: Secondary | ICD-10-CM

## 2016-05-20 DIAGNOSIS — J449 Chronic obstructive pulmonary disease, unspecified: Secondary | ICD-10-CM | POA: Diagnosis not present

## 2016-05-20 DIAGNOSIS — E876 Hypokalemia: Secondary | ICD-10-CM | POA: Insufficient documentation

## 2016-05-20 DIAGNOSIS — I10 Essential (primary) hypertension: Secondary | ICD-10-CM | POA: Insufficient documentation

## 2016-05-20 LAB — CBC
HEMATOCRIT: 41.3 % (ref 36.0–46.0)
HEMOGLOBIN: 13.4 g/dL (ref 12.0–15.0)
MCH: 28.8 pg (ref 26.0–34.0)
MCHC: 32.4 g/dL (ref 30.0–36.0)
MCV: 88.6 fL (ref 78.0–100.0)
Platelets: 289 10*3/uL (ref 150–400)
RBC: 4.66 MIL/uL (ref 3.87–5.11)
RDW: 15.2 % (ref 11.5–15.5)
WBC: 10.3 10*3/uL (ref 4.0–10.5)

## 2016-05-20 LAB — BASIC METABOLIC PANEL
Anion gap: 7 (ref 5–15)
BUN: 35 mg/dL — ABNORMAL HIGH (ref 6–20)
CHLORIDE: 107 mmol/L (ref 101–111)
CO2: 26 mmol/L (ref 22–32)
Calcium: 9.6 mg/dL (ref 8.9–10.3)
Creatinine, Ser: 1.14 mg/dL — ABNORMAL HIGH (ref 0.44–1.00)
GFR calc Af Amer: 47 mL/min — ABNORMAL LOW (ref >60)
GFR calc non Af Amer: 41 mL/min — ABNORMAL LOW (ref >60)
GLUCOSE: 111 mg/dL — AB (ref 65–99)
POTASSIUM: 3.2 mmol/L — AB (ref 3.5–5.1)
Sodium: 140 mmol/L (ref 135–145)

## 2016-05-20 LAB — I-STAT TROPONIN, ED
Troponin i, poc: 0.02 ng/mL (ref 0.00–0.08)
Troponin i, poc: 0.02 ng/mL (ref 0.00–0.08)

## 2016-05-20 MED ORDER — AEROCHAMBER PLUS W/MASK MISC
1.0000 | Freq: Once | Status: AC
  Administered 2016-05-20: 1
  Filled 2016-05-20: qty 1

## 2016-05-20 MED ORDER — SODIUM CHLORIDE 0.9 % IV BOLUS (SEPSIS)
500.0000 mL | Freq: Once | INTRAVENOUS | Status: AC
  Administered 2016-05-20: 500 mL via INTRAVENOUS

## 2016-05-20 MED ORDER — POTASSIUM CHLORIDE CRYS ER 20 MEQ PO TBCR
40.0000 meq | EXTENDED_RELEASE_TABLET | Freq: Once | ORAL | Status: AC
  Administered 2016-05-20: 40 meq via ORAL
  Filled 2016-05-20: qty 2

## 2016-05-20 MED ORDER — ALBUTEROL SULFATE (2.5 MG/3ML) 0.083% IN NEBU
5.0000 mg | INHALATION_SOLUTION | Freq: Once | RESPIRATORY_TRACT | Status: AC
  Administered 2016-05-20: 5 mg via RESPIRATORY_TRACT
  Filled 2016-05-20: qty 6

## 2016-05-20 NOTE — ED Notes (Signed)
Pt's O2 sat dropped into mid-80s RA.  Pt placed on 2L [place] and sat increased to 95%.

## 2016-05-20 NOTE — ED Notes (Signed)
Gave patient pills with applesauce.

## 2016-05-20 NOTE — ED Provider Notes (Signed)
[facility]-EMERGENCY DEPT Provider Note   CSN: [id number] Arrival date & time: 05/20/16  1319     History   Chief Complaint Chief Complaint  Patient presents with  . Shortness of Breath    HPI [name] is a 82 y.o. female.ComPlained shortness of breath onset this morning which resolved upon arrival here she is presently asymptomatic. Associated symptoms include mild cough. No fever. No chest pain. No other associated symptoms. No treatment prior to coming here.. She did receive albuterol nebulized treatment upon arrival here  HPI  Past Medical History:  Diagnosis Date  . Arthritis   . Chronic bronchitis (HCC)   . COPD (chronic obstructive pulmonary disease) (HCC)   . GERD (gastroesophageal reflux disease)   . Gout   . H/O hiatal hernia   . High cholesterol   . Hypertension     Patient Active Problem List   Diagnosis Date Noted  . Failure to thrive in adult 04/15/2016  . Acute encephalopathy 04/15/2016  . GERD (gastroesophageal reflux disease) 04/15/2016  . Orthostatic hypotension 03/18/2016  . Orthostatic dizziness 03/18/2016  . Postinflammatory pulmonary fibrosis (HCC) 04/29/2015  . Dyspnea 04/26/2015  . Severe malnutrition (HCC) 02/23/2015  . AKI (acute kidney injury) (HCC) 02/22/2015  . Arthritis 02/22/2015  . Weakness   . Dehydration 02/21/2015  . ARF (acute renal failure) (HCC) 10/03/2014  . Underweight 07/20/2014  . Acute bronchitis 07/20/2014  . COPD GOLD I with reversibility  07/18/2014  . Essential hypertension 07/18/2014  . Gout 07/18/2014  . SBO (small bowel obstruction) 01/14/2014  . Hypercalcemia 01/14/2014  . Protein-calorie malnutrition, severe (HCC) 10/10/2013  . Acute respiratory failure (HCC) 10/07/2013  . Hypokalemia 10/07/2013  . H. influenzae septicemia (HCC) 12/06/2012  . Leukocytopenia, unspecified 12/03/2012  . HTN (hypertension) 12/02/2012  . Gouty arthritis 12/02/2012    Past Surgical History:  Procedure Laterality Date  .  APPENDECTOMY  1945  . CATARACT EXTRACTION W/ INTRAOCULAR LENS  IMPLANT, BILATERAL Bilateral   . FRACTURE SURGERY    . HIP FRACTURE SURGERY     "don't remember which side"  . TEE WITHOUT CARDIOVERSION N/A 10/13/2013   Procedure: TRANSESOPHAGEAL ECHOCARDIOGRAM (TEE);  Surgeon: Quintella Reichertraci R Turner, MD;  Location: [facility] ENDOSCOPY;  Service: Cardiovascular;  Laterality: N/A;    OB History    No data available       Home Medications    Prior to Admission medications   Medication Sig Start Date End Date Taking? Authorizing Provider  acetaminophen (TYLENOL) 325 MG tablet Take 2 tablets (650 mg total) by mouth every 6 (six) hours as needed for mild pain (or Fever >/= 101). 03/21/16   Belkys A Regalado, MD  albuterol (PROVENTIL HFA;VENTOLIN HFA) 108 (90 Base) MCG/ACT inhaler Inhale 2 puffs into the lungs every 4 (four) hours as needed for wheezing or shortness of breath. 05/04/16   Audry Piliyler Mohr, PA-C  allopurinol (ZYLOPRIM) 300 MG tablet Take 450 mg by mouth daily.     Historical Provider, MD  atenolol (TENORMIN) 25 MG tablet Take 25 mg by mouth daily.     Historical Provider, MD  atorvastatin (LIPITOR) 20 MG tablet Take 20 mg by mouth at bedtime.    Historical Provider, MD  azithromycin (ZITHROMAX) 250 MG tablet Take 1 tablet (250 mg total) by mouth daily. Patient not taking: Reported on 05/12/2016 04/23/16   Tomasita CrumbleAdeleke Oni, MD  azithromycin (ZITHROMAX) 250 MG tablet Take 1 tablet (250 mg total) by mouth daily. Take first 2 tablets together, then 1 every day until  finished. 05/12/16   Lorre Nick, MD  budesonide (PULMICORT) 0.25 MG/2ML nebulizer solution Take 2 mLs (0.25 mg total) by nebulization 2 (two) times daily. 10/12/15   Nyoka Cowden, MD  chlorpheniramine-HYDROcodone (TUSSIONEX) 10-8 MG/5ML SUER Take 5 mLs by mouth 2 (two) times daily as needed for cough. 04/15/16   Historical Provider, MD  donepezil (ARICEPT) 5 MG tablet Take 1 tablet (5 mg total) by mouth at bedtime. 04/16/16   Rolly Salter, MD    doxycycline (VIBRAMYCIN) 100 MG capsule Take 1 capsule (100 mg total) by mouth 2 (two) times daily. Patient not taking: Reported on 05/12/2016 04/18/16   Ace Gins Alson Mcpheeters, PA-C  famotidine (PEPCID) 20 MG tablet Take 20 mg by mouth at bedtime. 10/19/15   Historical Provider, MD  ipratropium-albuterol (DUONEB) 0.5-2.5 (3) MG/3ML SOLN Take 3 mLs by nebulization every 6 (six) hours as needed. Patient taking differently: Take 3 mLs by nebulization every 6 (six) hours as needed (sob and wheezing).  05/04/16   Audry Pili, PA-C  lactose free nutrition (BOOST) LIQD Take 237 mLs by mouth 3 (three) times daily between meals.    Historical Provider, MD  losartan (COZAAR) 100 MG tablet Take 50 mg by mouth daily.    Historical Provider, MD  meclizine (ANTIVERT) 25 MG tablet Take 0.5 tablets (12.5 mg total) by mouth 3 (three) times daily as needed for dizziness. 01/05/16   Arby Barrette, MD  pantoprazole (PROTONIX) 40 MG tablet TAKE 1 TABLET BY MOUTH EVERY DAY 30-60 MINUTES BEFORE FIRST MEAL OF THE DAY 10/22/15   Nyoka Cowden, MD  polyethylene glycol [facility] / GLYCOLAX) packet Take 17 g by mouth daily as needed for mild constipation. 04/16/16   Rolly Salter, MD  predniSONE (DELTASONE) 20 MG tablet Take 2 tablets (40 mg total) by mouth daily. Patient not taking: Reported on 05/12/2016 04/18/16   Ace Gins Cyndi Montejano, PA-C  predniSONE (DELTASONE) 20 MG tablet Take 2 tablets (40 mg total) by mouth daily. 05/12/16   Lorre Nick, MD    Family History Family History  Problem Relation Age of Onset  . Cancer Mother   . Stroke Father   . Cirrhosis Brother     Social History Social History  Substance Use Topics  . Smoking status: Former Smoker    Packs/day: 0.50    Years: 70.00    Quit date: 08/18/2008  . Smokeless tobacco: Former Neurosurgeon    Quit date: 10/13/2004  . Alcohol use No     Allergies   Penicillins   Review of Systems Review of Systems  Constitutional: Negative.   HENT: Negative.   Respiratory: Positive for  cough and shortness of breath.   Cardiovascular: Negative.   Gastrointestinal: Negative.   Musculoskeletal: Negative.   Skin: Negative.   Neurological: Negative.   Psychiatric/Behavioral: Negative.   All other systems reviewed and are negative.    Physical Exam Updated Vital Signs BP 94/71 (BP Location: Right Arm)   Pulse 66   Temp 97.5 F (36.4 C) (Oral)   Resp 18   SpO2 92% Comment: Pt normally at 100%.  Physical Exam  Constitutional: No distress.  Frail-appearing  HENT:  Head: Normocephalic and atraumatic.  Eyes: Conjunctivae are normal. Pupils are equal, round, and reactive to light.  Neck: Neck supple. JVD present. No tracheal deviation present. No thyromegaly present.  Cardiovascular: Normal rate and regular rhythm.   No murmur heard. Pulmonary/Chest: Effort normal and breath sounds normal.  Abdominal: Soft. Bowel sounds are normal. She exhibits  no distension. There is no tenderness.  Musculoskeletal: Normal range of motion. She exhibits no edema or tenderness.  Neurological: She is alert. Coordination normal.  Skin: Skin is warm and dry. No rash noted.  Psychiatric: She has a normal mood and affect.  Nursing note and vitals reviewed.    ED Treatments / Results  Labs (all labs ordered are listed, but only abnormal results are displayed) Labs Reviewed  BASIC METABOLIC PANEL - Abnormal; Notable for the following:       Result Value   Potassium 3.2 (*)    Glucose, Bld 111 (*)    BUN 35 (*)    Creatinine, Ser 1.14 (*)    GFR calc non Af Amer 41 (*)    GFR calc Af Amer 47 (*)    All other components within normal limits  CBC  I-STAT TROPOININ, ED    EKG  EKG Interpretation None      ED ECG REPORT   Date: 05/20/2016  Rate: 65  Rhythm: normal sinus rhythm  QRS Axis: normal  Intervals: normal  ST/T Wave abnormalities: nonspecific T wave changes  Conduction Disutrbances:none  Narrative Interpretation:   Old EKG Reviewed: unchanged Unchanged from  05/12/2016 I have personally reviewed the EKG tracing and agree with the computerized printout as noted. Radiology No results found. Results for orders placed or performed during the hospital encounter of 05/20/16  Basic metabolic panel  Result Value Ref Range   Sodium 140 135 - 145 mmol/L   Potassium 3.2 (L) 3.5 - 5.1 mmol/L   Chloride 107 101 - 111 mmol/L   CO2 26 22 - 32 mmol/L   Glucose, Bld 111 (H) 65 - 99 mg/dL   BUN 35 (H) 6 - 20 mg/dL   Creatinine, Ser 4.09 (H) 0.44 - 1.00 mg/dL   Calcium 9.6 8.9 - 81.1 mg/dL   GFR calc non Af Amer 41 (L) >60 mL/min   GFR calc Af Amer 47 (L) >60 mL/min   Anion gap 7 5 - 15  CBC  Result Value Ref Range   WBC 10.3 4.0 - 10.5 K/uL   RBC 4.66 3.87 - 5.11 MIL/uL   Hemoglobin 13.4 12.0 - 15.0 g/dL   HCT 91.4 78.2 - 95.6 %   MCV 88.6 78.0 - 100.0 fL   MCH 28.8 26.0 - 34.0 pg   MCHC 32.4 30.0 - 36.0 g/dL   RDW 21.3 08.6 - 57.8 %   Platelets 289 150 - 400 K/uL  I-stat troponin, ED  Result Value Ref Range   Troponin i, poc 0.02 0.00 - 0.08 ng/mL   Comment 3          I-stat troponin, ED  Result Value Ref Range   Troponin i, poc 0.02 0.00 - 0.08 ng/mL   Comment 3           Dg Chest 1 View  Result Date: 04/23/2016 CLINICAL DATA:  Cough EXAM: CHEST 1 VIEW COMPARISON:  04/18/2016 FINDINGS: There is hyperinflation of the lungs compatible with COPD. Left lower lobe atelectasis or infiltrate with small left pleural effusion. No confluent opacity on the right. Heart is borderline in size. No acute bony abnormality. IMPRESSION: COPD. Left lower lobe atelectasis or pneumonia with small left effusion. Electronically Signed   By: Charlett Nose M.D.   On: 04/23/2016 14:29   Dg Chest 2 View  Result Date: 05/20/2016 CLINICAL DATA:  Onset of shortness of breath yesterday with increased symptoms today. Finished a antibiotic and prednisone coarse  2 days ago for respiratory infection and COPD exacerbation. EXAM: CHEST  2 VIEW COMPARISON:  PA and lateral chest  x-ray of May 12, 2016. FINDINGS: The lungs are hyperinflated. The interstitial markings are coarse though stable. There is new opacifications of the left hemidiaphragm and retrocardiac region on the left. The heart is normal in size. The pulmonary vascularity is normal. There is calcification in the wall of the aortic arch. A small left pleural effusion is suspected. The bony thorax exhibits no acute abnormality. IMPRESSION: COPD. New left lower lobe atelectasis or pneumonia with small left pleural effusion. Aortic atherosclerosis. Electronically Signed   By: David  [place] M.D.   On: 05/20/2016 16:33   Dg Chest 2 View  Result Date: 05/12/2016 CLINICAL DATA:  Shortness of breath. EXAM: CHEST  2 VIEW COMPARISON:  05/03/2016 .  09/18/2014. FINDINGS: Mediastinum hilar structures are normal. Heart size normal. Diffuse bilateral pulmonary interstitial prominence consistent with chronic interstitial lung disease again noted. Alveolar infiltrate in the left lung base cannot be excluded. Small left pleural effusion cannot be excluded. Similar findings noted on prior exam. Heart size stable. IMPRESSION: Chronic interstitial lung disease. Active pulmonary infiltrate in the left lung base again cannot be excluded. Small left pleural effusion cannot be excluded. No significant change from prior exam. Electronically Signed   By: Maisie Fus  Register   On: 05/12/2016 14:28   Dg Chest 2 View  Result Date: 05/03/2016 CLINICAL DATA:  Shortness of breath, onset this evening. EXAM: CHEST  2 VIEW COMPARISON:  Radiograph 04/23/2016 FINDINGS: The lungs are hyperinflated with chronic bronchial thickening. There is volume loss at the left lung base with ill-defined left basilar opacity. Persistent blunting of left costophrenic angle. There is mild improvement compared to most recent prior radiograph. Unchanged cardiomediastinal contours right hilar prominence of the is atherosclerosis and tortuosity of thoracic aorta. No  pneumothorax. Remote right rib fractures. IMPRESSION: 1. Chronic hyperinflation and bronchial thickening. 2. Volume loss at the left lung base with ill-defined left basilar opacity and blunting of the costophrenic angle. This is mildly improved compared to radiograph 10 days prior, and may be atelectasis or resolving pneumonia. Recommend continued radiographic follow-up to ensure complete resolution. Electronically Signed   By: Rubye Oaks M.D.   On: 05/03/2016 22:37   Ct Head Wo Contrast  Result Date: 04/23/2016 CLINICAL DATA:  Mental status changes. EXAM: CT HEAD WITHOUT CONTRAST TECHNIQUE: Contiguous axial images were obtained from the base of the skull through the vertex without intravenous contrast. COMPARISON:  None. FINDINGS: Brain: Age related cerebral atrophy, ventriculomegaly and periventricular white matter disease. Probable small remote cortical infarct involving the right cerebellar hemisphere. No CT findings for acute hemispheric infarction or intracranial hemorrhage. No extra-axial fluid collections are identified. Vascular: Moderate atherosclerotic calcifications. No definite aneurysm. Skull: No skull fracture or bone lesion. Sinuses/Orbits: The paranasal sinuses and mastoid air cells are clear. The globes are intact. Other: None IMPRESSION: Age related cerebral atrophy, ventriculomegaly and periventricular white matter disease. No new/acute intracranial findings or mass lesions. 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 Electronically Signed   By: Rudie Meyer M.D.   On: 04/23/2016 16:39   Procedures Procedures (including critical care time)  Medications Ordered in ED Medications  sodium chloride 0.9 % bolus 500 mL (not administered)  albuterol (PROVENTIL) (2.5 MG/3ML) 0.083% nebulizer solution 5 mg (5 mg Nebulization Given 05/20/16 1349)   7:50 PM patient remains asymptomatic. Denies shortness of breath  Initial Impression / Assessment and Plan / ED Course  I  have  reviewed the triage vital signs and the nursing notes.  Pertinent labs & imaging results that were available during my care of the patient were reviewed by me and considered in my medical decision making (see chart for details).  Clinical Course     Chest x-ray viewed by me. Doubt pneumonia. No fever no leukocytosis, lungs clear to auscultation. Only minimal cough. Plan she will get a spacer T at her pro-air inhaler to use 2 puffs every 4 hours when necessary shortness of breath or cough. Keep scheduled appointment with Dr. Selena Batten. Doubt acute coronary syndrome with transient shortness of breath only. Symptoms most likely consistent with COPD as symptoms resolved after nebulizer treatment Received potassium chloride oral supplement while here Final Clinical Impressions(s) / ED Diagnoses  Dx #1 COPD exacerbation #2 hypokalemia Final diagnoses:  None    New Prescriptions New Prescriptions   No medications on file     Doug Sou, MD 05/20/16 2002

## 2016-05-20 NOTE — Discharge Instructions (Signed)
Use your pro-air inhaler with spacer 2 puffs every 4 hours as needed for cough or shortness of breath. Return if needed more than every 4 hours. Keep your scheduled appointment with Dr.Kim this week.

## 2016-05-20 NOTE — ED Notes (Signed)
MD at bedside. 

## 2016-05-20 NOTE — Progress Notes (Addendum)
Patient noted to have have had 12 visits to the ED with 2 admissions. EDCM spoke to patient and family member at bedside. Patient now staying with [name].  [name] refers to patient as her aunt as patient's brother was [name]'s god father.  Per [name], patient is no longer in contact with [name], patient's nephew.  [name] reports she has already filed an APS report.  [name] reports she is going to sign the paperwork for POA for patient this Friday.  [name] reports patient needs  A nebulizer.  [name] reports patient does not require any further dme at this time.  [name] reports the patient has a walker, but she doesn't use it.  [name] reports she assists patient with her ADL's at home.  [name] reports patient's pcp is Dr. Selena BattenKim and patient has an appointment with her pcp on Thursday.   EDCM informed [name] if patient is in need of home health services, these services may be arranged by patient's pcp.  EDCM provided [name] with contact information for the [facility] [place],  [facility], and [facility].  Patient's family member thankful for resources.  No further EDCM needs at this time.

## 2016-05-20 NOTE — ED Triage Notes (Signed)
Pt c/o SOB onset yesterday, worse today. Pt finished azithromycin and prednisone course 2 days ago for respiratory infection and COPD exacerbation. Hx COPD.

## 2016-05-22 DIAGNOSIS — J449 Chronic obstructive pulmonary disease, unspecified: Secondary | ICD-10-CM | POA: Diagnosis not present

## 2016-05-22 DIAGNOSIS — E7800 Pure hypercholesterolemia, unspecified: Secondary | ICD-10-CM | POA: Diagnosis not present

## 2016-05-22 DIAGNOSIS — E559 Vitamin D deficiency, unspecified: Secondary | ICD-10-CM | POA: Diagnosis not present

## 2016-05-22 DIAGNOSIS — I10 Essential (primary) hypertension: Secondary | ICD-10-CM | POA: Diagnosis not present

## 2016-05-27 DIAGNOSIS — Z111 Encounter for screening for respiratory tuberculosis: Secondary | ICD-10-CM | POA: Diagnosis not present

## 2016-06-10 DIAGNOSIS — Z111 Encounter for screening for respiratory tuberculosis: Secondary | ICD-10-CM | POA: Diagnosis not present

## 2016-07-14 DIAGNOSIS — R443 Hallucinations, unspecified: Secondary | ICD-10-CM | POA: Diagnosis not present

## 2016-07-21 DIAGNOSIS — R443 Hallucinations, unspecified: Secondary | ICD-10-CM | POA: Diagnosis not present

## 2016-07-21 DIAGNOSIS — I10 Essential (primary) hypertension: Secondary | ICD-10-CM | POA: Diagnosis not present

## 2016-07-21 DIAGNOSIS — M109 Gout, unspecified: Secondary | ICD-10-CM | POA: Diagnosis not present

## 2016-07-21 DIAGNOSIS — F22 Delusional disorders: Secondary | ICD-10-CM | POA: Diagnosis not present

## 2016-07-21 DIAGNOSIS — J449 Chronic obstructive pulmonary disease, unspecified: Secondary | ICD-10-CM | POA: Diagnosis not present

## 2016-07-21 DIAGNOSIS — G4700 Insomnia, unspecified: Secondary | ICD-10-CM | POA: Diagnosis not present

## 2016-07-31 DIAGNOSIS — I10 Essential (primary) hypertension: Secondary | ICD-10-CM | POA: Diagnosis not present

## 2016-07-31 DIAGNOSIS — F22 Delusional disorders: Secondary | ICD-10-CM | POA: Diagnosis not present

## 2016-07-31 DIAGNOSIS — J449 Chronic obstructive pulmonary disease, unspecified: Secondary | ICD-10-CM | POA: Diagnosis not present

## 2016-07-31 DIAGNOSIS — M109 Gout, unspecified: Secondary | ICD-10-CM | POA: Diagnosis not present

## 2016-07-31 DIAGNOSIS — G4700 Insomnia, unspecified: Secondary | ICD-10-CM | POA: Diagnosis not present

## 2016-07-31 DIAGNOSIS — R443 Hallucinations, unspecified: Secondary | ICD-10-CM | POA: Diagnosis not present

## 2016-08-06 DIAGNOSIS — J449 Chronic obstructive pulmonary disease, unspecified: Secondary | ICD-10-CM | POA: Diagnosis not present

## 2016-08-06 DIAGNOSIS — M109 Gout, unspecified: Secondary | ICD-10-CM | POA: Diagnosis not present

## 2016-08-06 DIAGNOSIS — I10 Essential (primary) hypertension: Secondary | ICD-10-CM | POA: Diagnosis not present

## 2016-08-06 DIAGNOSIS — G4700 Insomnia, unspecified: Secondary | ICD-10-CM | POA: Diagnosis not present

## 2016-08-06 DIAGNOSIS — R443 Hallucinations, unspecified: Secondary | ICD-10-CM | POA: Diagnosis not present

## 2016-08-06 DIAGNOSIS — F22 Delusional disorders: Secondary | ICD-10-CM | POA: Diagnosis not present

## 2016-08-14 DIAGNOSIS — J449 Chronic obstructive pulmonary disease, unspecified: Secondary | ICD-10-CM | POA: Diagnosis not present

## 2016-08-14 DIAGNOSIS — R443 Hallucinations, unspecified: Secondary | ICD-10-CM | POA: Diagnosis not present

## 2016-08-14 DIAGNOSIS — F22 Delusional disorders: Secondary | ICD-10-CM | POA: Diagnosis not present

## 2016-08-14 DIAGNOSIS — G4700 Insomnia, unspecified: Secondary | ICD-10-CM | POA: Diagnosis not present

## 2016-08-14 DIAGNOSIS — M109 Gout, unspecified: Secondary | ICD-10-CM | POA: Diagnosis not present

## 2016-08-14 DIAGNOSIS — I10 Essential (primary) hypertension: Secondary | ICD-10-CM | POA: Diagnosis not present

## 2016-08-15 ENCOUNTER — Encounter (HOSPITAL_COMMUNITY): Payer: Self-pay | Admitting: Emergency Medicine

## 2016-08-15 ENCOUNTER — Emergency Department (HOSPITAL_COMMUNITY)
Admission: EM | Admit: 2016-08-15 | Discharge: 2016-08-15 | Disposition: A | Discharge status: Home / Self Care | Payer: Commercial Managed Care - HMO | Attending: Emergency Medicine | Admitting: Emergency Medicine

## 2016-08-15 DIAGNOSIS — J449 Chronic obstructive pulmonary disease, unspecified: Secondary | ICD-10-CM | POA: Insufficient documentation

## 2016-08-15 DIAGNOSIS — R6889 Other general symptoms and signs: Secondary | ICD-10-CM | POA: Diagnosis not present

## 2016-08-15 DIAGNOSIS — Z87891 Personal history of nicotine dependence: Secondary | ICD-10-CM | POA: Diagnosis not present

## 2016-08-15 DIAGNOSIS — I10 Essential (primary) hypertension: Secondary | ICD-10-CM | POA: Diagnosis not present

## 2016-08-15 DIAGNOSIS — E860 Dehydration: Secondary | ICD-10-CM | POA: Diagnosis not present

## 2016-08-15 DIAGNOSIS — R404 Transient alteration of awareness: Secondary | ICD-10-CM | POA: Diagnosis not present

## 2016-08-15 DIAGNOSIS — Z79899 Other long term (current) drug therapy: Secondary | ICD-10-CM | POA: Diagnosis not present

## 2016-08-15 LAB — CBC WITH DIFFERENTIAL/PLATELET
BASOS PCT: 0 %
Basophils Absolute: 0 10*3/uL (ref 0.0–0.1)
EOS ABS: 0.3 10*3/uL (ref 0.0–0.7)
Eosinophils Relative: 4 %
HCT: 31.5 % — ABNORMAL LOW (ref 36.0–46.0)
HEMOGLOBIN: 10.3 g/dL — AB (ref 12.0–15.0)
Lymphocytes Relative: 15 %
Lymphs Abs: 1.3 10*3/uL (ref 0.7–4.0)
MCH: 28.3 pg (ref 26.0–34.0)
MCHC: 32.7 g/dL (ref 30.0–36.0)
MCV: 86.5 fL (ref 78.0–100.0)
MONOS PCT: 8 %
Monocytes Absolute: 0.7 10*3/uL (ref 0.1–1.0)
NEUTROS ABS: 6.1 10*3/uL (ref 1.7–7.7)
NEUTROS PCT: 73 %
Platelets: 303 10*3/uL (ref 150–400)
RBC: 3.64 MIL/uL — AB (ref 3.87–5.11)
RDW: 15.4 % (ref 11.5–15.5)
WBC: 8.4 10*3/uL (ref 4.0–10.5)

## 2016-08-15 LAB — BASIC METABOLIC PANEL
ANION GAP: 13 (ref 5–15)
BUN: 9 mg/dL (ref 6–20)
CHLORIDE: 100 mmol/L — AB (ref 101–111)
CO2: 21 mmol/L — AB (ref 22–32)
Calcium: 8.6 mg/dL — ABNORMAL LOW (ref 8.9–10.3)
Creatinine, Ser: 0.82 mg/dL (ref 0.44–1.00)
GFR calc non Af Amer: >60 mL/min (ref >60)
Glucose, Bld: 84 mg/dL (ref 65–99)
POTASSIUM: 3.8 mmol/L (ref 3.5–5.1)
SODIUM: 134 mmol/L — AB (ref 135–145)

## 2016-08-15 LAB — URINALYSIS, ROUTINE W REFLEX MICROSCOPIC
BILIRUBIN URINE: NEGATIVE
Glucose, UA: NEGATIVE mg/dL
Hgb urine dipstick: NEGATIVE
Ketones, ur: NEGATIVE mg/dL
LEUKOCYTES UA: NEGATIVE
NITRITE: NEGATIVE
PH: 7 (ref 5.0–8.0)
Protein, ur: NEGATIVE mg/dL
SPECIFIC GRAVITY, URINE: 1.006 (ref 1.005–1.030)

## 2016-08-15 NOTE — ED Notes (Signed)
Pt departed in NAD, in care of [facility] 

## 2016-08-15 NOTE — ED Notes (Addendum)
Patient grasps and moves water cup from bedside table to mouth without difficulty.  Patient swallows water without difficulty.

## 2016-08-15 NOTE — ED Triage Notes (Signed)
Per [facility] patient with history of dementia with complaint of increased confusion.  EMS reports family stated patient is normally alert and oriented but currently is confused.  Patient oriented to self and location only.  Patient states she has no complaints.

## 2016-08-15 NOTE — ED Notes (Signed)
Patient tolerating oral fluids, taking occasional sips of water.

## 2016-08-15 NOTE — ED Notes (Signed)
Report given to [facility] care giver, pt to follow up with PCP family member aware and understand dc instructions.

## 2016-08-15 NOTE — ED Provider Notes (Signed)
[facility] DEPT Provider Note   CSN: [id number] Arrival date & time: 08/15/16  1726     History   Chief Complaint Chief Complaint  Patient presents with  . Altered Mental Status    HPI [name] is a 82 y.o. female.  The history is provided by a caregiver.   She was called by the sNF for possible dehydration and AMS. Pt at baseline per caregiver. States that her mentation fluctuates. She has been improving since being placed on Zyprexa several weeks ago for agitation.  CC: dehydration  Onset/Duration: noted this morning by sNF.  Care giver states that she has been eating and drinking less in the last several days. No reported emesis, diarrhea, change in chronic cough, fever.  Remainder of history, ROS, and physical exam limited due to patient's condition (dementia). Additional information was obtained from caregiver.   Level V Caveat.    Past Medical History:  Diagnosis Date  . Arthritis   . Chronic bronchitis (HCC)   . COPD (chronic obstructive pulmonary disease) (HCC)   . GERD (gastroesophageal reflux disease)   . Gout   . H/O hiatal hernia   . High cholesterol   . Hypertension     Patient Active Problem List   Diagnosis Date Noted  . Failure to thrive in adult 04/15/2016  . Acute encephalopathy 04/15/2016  . GERD (gastroesophageal reflux disease) 04/15/2016  . Orthostatic hypotension 03/18/2016  . Orthostatic dizziness 03/18/2016  . Postinflammatory pulmonary fibrosis (HCC) 04/29/2015  . Dyspnea 04/26/2015  . Severe malnutrition (HCC) 02/23/2015  . AKI (acute kidney injury) (HCC) 02/22/2015  . Arthritis 02/22/2015  . Weakness   . Dehydration 02/21/2015  . ARF (acute renal failure) (HCC) 10/03/2014  . Underweight 07/20/2014  . Acute bronchitis 07/20/2014  . COPD GOLD I with reversibility  07/18/2014  . Essential hypertension 07/18/2014  . Gout 07/18/2014  . SBO (small bowel obstruction) 01/14/2014  . Hypercalcemia 01/14/2014  .  Protein-calorie malnutrition, severe (HCC) 10/10/2013  . Acute respiratory failure (HCC) 10/07/2013  . Hypokalemia 10/07/2013  . H. influenzae septicemia (HCC) 12/06/2012  . Leukocytopenia, unspecified 12/03/2012  . HTN (hypertension) 12/02/2012  . Gouty arthritis 12/02/2012    Past Surgical History:  Procedure Laterality Date  . APPENDECTOMY  1945  . CATARACT EXTRACTION W/ INTRAOCULAR LENS  IMPLANT, BILATERAL Bilateral   . FRACTURE SURGERY    . HIP FRACTURE SURGERY     "don't remember which side"  . TEE WITHOUT CARDIOVERSION N/A 10/13/2013   Procedure: TRANSESOPHAGEAL ECHOCARDIOGRAM (TEE);  Surgeon: Quintella Reichert, MD;  Location: [facility] ENDOSCOPY;  Service: Cardiovascular;  Laterality: N/A;    OB History    No data available       Home Medications    Prior to Admission medications   Medication Sig Start Date End Date Taking? Authorizing Provider  albuterol (PROVENTIL) (2.5 MG/3ML) 0.083% nebulizer solution Take 2.5 mg by nebulization 3 (three) times daily.   Yes Historical Provider, MD  allopurinol (ZYLOPRIM) 300 MG tablet Take 300 mg by mouth daily.    Yes Historical Provider, MD  atenolol (TENORMIN) 25 MG tablet Take 25 mg by mouth at bedtime.    Yes Historical Provider, MD  acetaminophen (TYLENOL) 325 MG tablet Take 2 tablets (650 mg total) by mouth every 6 (six) hours as needed for mild pain (or Fever >/= 101). Patient not taking: Reported on 08/15/2016 03/21/16   Belkys A Regalado, MD  albuterol (PROVENTIL HFA;VENTOLIN HFA) 108 (90 Base) MCG/ACT inhaler Inhale 2 puffs  into the lungs every 4 (four) hours as needed for wheezing or shortness of breath. Patient not taking: Reported on 08/15/2016 05/04/16   Audry Piliyler Mohr, PA-C  atorvastatin (LIPITOR) 20 MG tablet Take 20 mg by mouth at bedtime.    Historical Provider, MD  azithromycin (ZITHROMAX) 250 MG tablet Take 1 tablet (250 mg total) by mouth daily. Patient not taking: Reported on 08/15/2016 04/23/16   Tomasita CrumbleAdeleke Oni, MD  azithromycin  (ZITHROMAX) 250 MG tablet Take 1 tablet (250 mg total) by mouth daily. Take first 2 tablets together, then 1 every day until finished. Patient not taking: Reported on 08/15/2016 05/12/16   Lorre NickAnthony Allen, MD  budesonide (PULMICORT) 0.25 MG/2ML nebulizer solution Take 2 mLs (0.25 mg total) by nebulization 2 (two) times daily. Patient not taking: Reported on 08/15/2016 10/12/15   Nyoka CowdenMichael B Wert, MD  chlorpheniramine-HYDROcodone (TUSSIONEX) 10-8 MG/5ML SUER Take 5 mLs by mouth 2 (two) times daily as needed for cough. 04/15/16   Historical Provider, MD  donepezil (ARICEPT) 5 MG tablet Take 1 tablet (5 mg total) by mouth at bedtime. Patient not taking: Reported on 08/15/2016 04/16/16   Rolly SalterPranav M Patel, MD  doxycycline (VIBRAMYCIN) 100 MG capsule Take 1 capsule (100 mg total) by mouth 2 (two) times daily. Patient not taking: Reported on 05/20/2016 04/18/16   Ace GinsSerena Y Sam, PA-C  famotidine (PEPCID) 20 MG tablet Take 20 mg by mouth at bedtime. 10/19/15   Historical Provider, MD  ipratropium-albuterol (DUONEB) 0.5-2.5 (3) MG/3ML SOLN Take 3 mLs by nebulization every 6 (six) hours as needed. Patient taking differently: Take 3 mLs by nebulization every 6 (six) hours as needed (sob and wheezing).  05/04/16   Audry Piliyler Mohr, PA-C  lactose free nutrition (BOOST) LIQD Take 237 mLs by mouth 3 (three) times daily between meals.    Historical Provider, MD  losartan (COZAAR) 100 MG tablet Take 50 mg by mouth daily.    Historical Provider, MD  meclizine (ANTIVERT) 25 MG tablet Take 0.5 tablets (12.5 mg total) by mouth 3 (three) times daily as needed for dizziness. Patient not taking: Reported on 08/15/2016 01/05/16   Arby BarretteMarcy Pfeiffer, MD  OLANZapine (ZYPREXA) 5 MG tablet Take 5 mg by mouth at bedtime. 07/11/16   Historical Provider, MD  pantoprazole (PROTONIX) 40 MG tablet TAKE 1 TABLET BY MOUTH EVERY DAY 30-60 MINUTES BEFORE FIRST MEAL OF THE DAY Patient taking differently: TAKE 40 MG BY MOUTH EVERY DAY 30-60 MINUTES BEFORE FIRST MEAL  OF THE DAY 10/22/15   Nyoka CowdenMichael B Wert, MD  polyethylene glycol [facility](MIRALAX / GLYCOLAX) packet Take 17 g by mouth daily as needed for mild constipation. Patient not taking: Reported on 08/15/2016 04/16/16   Rolly SalterPranav M Patel, MD  predniSONE (DELTASONE) 20 MG tablet Take 2 tablets (40 mg total) by mouth daily. Patient not taking: Reported on 08/15/2016 04/18/16   Ace GinsSerena Y Sam, PA-C  predniSONE (DELTASONE) 20 MG tablet Take 2 tablets (40 mg total) by mouth daily. Patient not taking: Reported on 08/15/2016 05/12/16   Lorre NickAnthony Allen, MD    Family History Family History  Problem Relation Age of Onset  . Cancer Mother   . Stroke Father   . Cirrhosis Brother     Social History Social History  Substance Use Topics  . Smoking status: Former Smoker    Packs/day: 0.50    Years: 70.00    Quit date: 08/18/2008  . Smokeless tobacco: Former NeurosurgeonUser    Quit date: 10/13/2004  . Alcohol use No     Allergies  Penicillins   Review of Systems Review of Systems  Unable to perform ROS: Dementia     Physical Exam Updated Vital Signs BP 138/90 (BP Location: Right Arm)   Pulse 82   Temp 97.4 F (36.3 C) (Oral)   Resp 20   SpO2 97%   Physical Exam  Constitutional: She appears well-developed. She appears cachectic. No distress.  HENT:  Head: Normocephalic and atraumatic.  Nose: Nose normal.  Mouth/Throat: Mucous membranes are dry.  Eyes: Conjunctivae and EOM are normal. Pupils are equal, round, and reactive to light. Right eye exhibits no discharge. Left eye exhibits no discharge. No scleral icterus.  Neck: Normal range of motion. Neck supple.  Cardiovascular: Normal rate and regular rhythm.  Exam reveals no gallop and no friction rub.   No murmur heard. Pulmonary/Chest: Effort normal and breath sounds normal. No stridor. No respiratory distress. She has no rales.  Abdominal: Soft. She exhibits no distension. There is no tenderness.  Musculoskeletal: She exhibits no edema or tenderness.  Neurological:  She is alert. She is disoriented (oriented to self).  Skin: Skin is warm and dry. No rash noted. She is not diaphoretic. No erythema.  Psychiatric: She has a normal mood and affect.  Vitals reviewed.    ED Treatments / Results  Labs (all labs ordered are listed, but only abnormal results are displayed) Labs Reviewed  CBC WITH DIFFERENTIAL/PLATELET - Abnormal; Notable for the following:       Result Value   RBC 3.64 (*)    Hemoglobin 10.3 (*)    HCT 31.5 (*)    All other components within normal limits  BASIC METABOLIC PANEL - Abnormal; Notable for the following:    Sodium 134 (*)    Chloride 100 (*)    CO2 21 (*)    Calcium 8.6 (*)    All other components within normal limits  URINE CULTURE  URINALYSIS, ROUTINE W REFLEX MICROSCOPIC    EKG  EKG Interpretation None       Radiology No results found.  Procedures Procedures (including critical care time)  Medications Ordered in ED Medications - No data to display   Initial Impression / Assessment and Plan / ED Course  I have reviewed the triage vital signs and the nursing notes.  Pertinent labs & imaging results that were available during my care of the patient were reviewed by me and considered in my medical decision making (see chart for details).  Clinical Course as of Aug 15 2145  Fri Aug 15, 2016  2144 Patient at her baseline mental status per caregiver. Labs grossly reassuring. UA without evidence of infection. Patient was able to drink several cups of water here and tolerated it well.  The patient is safe for discharge with strict return precautions.   [PC]    Clinical Course User Index [PC] Nira Conn, MD      Final Clinical Impressions(s) / ED Diagnoses   Final diagnoses:  Dehydration   Disposition: Discharge  Condition: Good  I have discussed the results, Dx and Tx plan with the patient's caregiver who expressed understanding and agree(s) with the plan. Discharge instructions  discussed at great length. The patient's caregiver was given strict return precautions who verbalized understanding of the instructions. No further questions at time of discharge.    New Prescriptions   No medications on file    Follow Up: Pearson Grippe, MD [address] [place] [address] [place] [address] [phone]  Schedule an appointment as soon as  possible for a visit  As needed      Nira ConnPedro Eduardo Cardama, MD 08/15/16 2147

## 2016-08-15 NOTE — ED Notes (Signed)
Patient with history of dementia not cooperating with neuro assessments.  Patient appears to move blankets and clothing intentionally without difficulty.

## 2016-08-15 NOTE — ED Notes (Signed)
Family at the bedside states the patient's "mind comes and goes".  The patient is cooperative and knows is oriented sometimes and other times she is confused and combative.  Family states this has been happening for since before September, but that the patient has gotten worse this week.  Family also states they were prompted to send the patient the ED when a home health nurse suggested the patient appeared dehydrated.

## 2016-08-17 LAB — URINE CULTURE: CULTURE: NO GROWTH

## 2016-08-20 DIAGNOSIS — J449 Chronic obstructive pulmonary disease, unspecified: Secondary | ICD-10-CM | POA: Diagnosis not present

## 2016-08-20 DIAGNOSIS — R443 Hallucinations, unspecified: Secondary | ICD-10-CM | POA: Diagnosis not present

## 2016-08-20 DIAGNOSIS — F22 Delusional disorders: Secondary | ICD-10-CM | POA: Diagnosis not present

## 2016-08-20 DIAGNOSIS — G4700 Insomnia, unspecified: Secondary | ICD-10-CM | POA: Diagnosis not present

## 2016-08-20 DIAGNOSIS — I10 Essential (primary) hypertension: Secondary | ICD-10-CM | POA: Diagnosis not present

## 2016-08-20 DIAGNOSIS — M109 Gout, unspecified: Secondary | ICD-10-CM | POA: Diagnosis not present

## 2016-08-28 DIAGNOSIS — I10 Essential (primary) hypertension: Secondary | ICD-10-CM | POA: Diagnosis not present

## 2016-08-28 DIAGNOSIS — R443 Hallucinations, unspecified: Secondary | ICD-10-CM | POA: Diagnosis not present

## 2016-08-28 DIAGNOSIS — F22 Delusional disorders: Secondary | ICD-10-CM | POA: Diagnosis not present

## 2016-08-28 DIAGNOSIS — J449 Chronic obstructive pulmonary disease, unspecified: Secondary | ICD-10-CM | POA: Diagnosis not present

## 2016-08-28 DIAGNOSIS — G4700 Insomnia, unspecified: Secondary | ICD-10-CM | POA: Diagnosis not present

## 2016-08-28 DIAGNOSIS — M109 Gout, unspecified: Secondary | ICD-10-CM | POA: Diagnosis not present

## 2016-12-01 ENCOUNTER — Emergency Department (HOSPITAL_COMMUNITY): Payer: Medicare HMO

## 2016-12-01 ENCOUNTER — Encounter (HOSPITAL_COMMUNITY): Payer: Self-pay

## 2016-12-01 ENCOUNTER — Emergency Department (HOSPITAL_COMMUNITY)
Admission: EM | Admit: 2016-12-01 | Discharge: 2016-12-01 | Disposition: A | Discharge status: Home / Self Care | Payer: Medicare HMO | Attending: Emergency Medicine | Admitting: Emergency Medicine

## 2016-12-01 DIAGNOSIS — Z79899 Other long term (current) drug therapy: Secondary | ICD-10-CM | POA: Insufficient documentation

## 2016-12-01 DIAGNOSIS — R410 Disorientation, unspecified: Secondary | ICD-10-CM | POA: Diagnosis not present

## 2016-12-01 DIAGNOSIS — I10 Essential (primary) hypertension: Secondary | ICD-10-CM | POA: Insufficient documentation

## 2016-12-01 DIAGNOSIS — N179 Acute kidney failure, unspecified: Secondary | ICD-10-CM | POA: Insufficient documentation

## 2016-12-01 DIAGNOSIS — R4182 Altered mental status, unspecified: Secondary | ICD-10-CM | POA: Diagnosis not present

## 2016-12-01 DIAGNOSIS — Z87891 Personal history of nicotine dependence: Secondary | ICD-10-CM | POA: Insufficient documentation

## 2016-12-01 DIAGNOSIS — F4489 Other dissociative and conversion disorders: Secondary | ICD-10-CM | POA: Diagnosis not present

## 2016-12-01 DIAGNOSIS — E860 Dehydration: Secondary | ICD-10-CM | POA: Diagnosis not present

## 2016-12-01 DIAGNOSIS — I6789 Other cerebrovascular disease: Secondary | ICD-10-CM | POA: Diagnosis not present

## 2016-12-01 DIAGNOSIS — J449 Chronic obstructive pulmonary disease, unspecified: Secondary | ICD-10-CM | POA: Insufficient documentation

## 2016-12-01 LAB — CBC WITH DIFFERENTIAL/PLATELET
BASOS ABS: 0 10*3/uL (ref 0.0–0.1)
Basophils Relative: 0 %
Eosinophils Absolute: 0 10*3/uL (ref 0.0–0.7)
Eosinophils Relative: 0 %
HEMATOCRIT: 34.1 % — AB (ref 36.0–46.0)
HEMOGLOBIN: 11.1 g/dL — AB (ref 12.0–15.0)
LYMPHS PCT: 8 %
Lymphs Abs: 1.3 10*3/uL (ref 0.7–4.0)
MCH: 29.9 pg (ref 26.0–34.0)
MCHC: 32.6 g/dL (ref 30.0–36.0)
MCV: 91.9 fL (ref 78.0–100.0)
MONO ABS: 0.9 10*3/uL (ref 0.1–1.0)
Monocytes Relative: 5 %
NEUTROS ABS: 14.6 10*3/uL — AB (ref 1.7–7.7)
NEUTROS PCT: 87 %
Platelets: 233 10*3/uL (ref 150–400)
RBC: 3.71 MIL/uL — AB (ref 3.87–5.11)
RDW: 14.1 % (ref 11.5–15.5)
WBC: 16.9 10*3/uL — ABNORMAL HIGH (ref 4.0–10.5)

## 2016-12-01 LAB — URINALYSIS, ROUTINE W REFLEX MICROSCOPIC
Bilirubin Urine: NEGATIVE
GLUCOSE, UA: NEGATIVE mg/dL
Hgb urine dipstick: NEGATIVE
Ketones, ur: NEGATIVE mg/dL
LEUKOCYTES UA: NEGATIVE
Nitrite: NEGATIVE
PH: 5 (ref 5.0–8.0)
Protein, ur: NEGATIVE mg/dL
SPECIFIC GRAVITY, URINE: 1.023 (ref 1.005–1.030)

## 2016-12-01 LAB — BASIC METABOLIC PANEL
ANION GAP: 7 (ref 5–15)
BUN: 27 mg/dL — ABNORMAL HIGH (ref 6–20)
CO2: 26 mmol/L (ref 22–32)
Calcium: 9.2 mg/dL (ref 8.9–10.3)
Chloride: 105 mmol/L (ref 101–111)
Creatinine, Ser: 1.04 mg/dL — ABNORMAL HIGH (ref 0.44–1.00)
GFR calc Af Amer: 53 mL/min — ABNORMAL LOW (ref >60)
GFR, EST NON AFRICAN AMERICAN: 46 mL/min — AB (ref >60)
GLUCOSE: 99 mg/dL (ref 65–99)
POTASSIUM: 3.9 mmol/L (ref 3.5–5.1)
Sodium: 138 mmol/L (ref 135–145)

## 2016-12-01 LAB — I-STAT TROPONIN, ED: Troponin i, poc: 0.03 ng/mL (ref 0.00–0.08)

## 2016-12-01 MED ORDER — ALBUTEROL SULFATE HFA 108 (90 BASE) MCG/ACT IN AERS: 2.0000 | INHALATION_SPRAY | RESPIRATORY_TRACT | 0 refills | Status: AC | PRN

## 2016-12-01 MED ORDER — SODIUM CHLORIDE 0.9 % IV BOLUS (SEPSIS)
500.0000 mL | Freq: Once | INTRAVENOUS | Status: AC
  Administered 2016-12-01: 500 mL via INTRAVENOUS

## 2016-12-01 MED ORDER — SODIUM CHLORIDE 0.9 % IV SOLN: Freq: Once | INTRAVENOUS | Status: DC

## 2016-12-01 MED ORDER — IPRATROPIUM-ALBUTEROL 0.5-2.5 (3) MG/3ML IN SOLN: 3.0000 mL | Freq: Four times a day (QID) | RESPIRATORY_TRACT | 0 refills | Status: AC | PRN

## 2016-12-01 NOTE — ED Notes (Signed)
Pt returned from imaging.

## 2016-12-01 NOTE — ED Provider Notes (Signed)
[facility] Provider Note   CSN: [id number] Arrival date & time: 12/01/16  1037     History   Chief Complaint Chief Complaint  Patient presents with  . Altered Mental Status    HPI [name] is a 83 y.o. female.  The history is provided by medical records.  Altered Mental Status     [name] is a 83 y.o. female  with a PMH of COPD, GERD, arthris who presents to the Emergency Department from group home for altered mental status. Per EMS, patient typically walks around but today was not able to walk. She appeared less responsive than usual. When facility nurse went to check on patient this morning, she found her sitting in her own feces and urine which is not typical for her. Stool seemed loose but no blood. EMS was called to bring patient to ER. Vitals were wdl upon EMS arrival.   Level V caveat due to dementia, acuity of condition. No caregiver or relative at bedside to aide in history or baseline status.    Past Medical History:  Diagnosis Date  . Arthritis   . Chronic bronchitis (HCC)   . COPD (chronic obstructive pulmonary disease) (HCC)   . GERD (gastroesophageal reflux disease)   . Gout   . H/O hiatal hernia   . High cholesterol   . Hypertension     Patient Active Problem List   Diagnosis Date Noted  . Failure to thrive in adult 04/15/2016  . Acute encephalopathy 04/15/2016  . GERD (gastroesophageal reflux disease) 04/15/2016  . Orthostatic hypotension 03/18/2016  . Orthostatic dizziness 03/18/2016  . Postinflammatory pulmonary fibrosis (HCC) 04/29/2015  . Dyspnea 04/26/2015  . Severe malnutrition (HCC) 02/23/2015  . AKI (acute kidney injury) (HCC) 02/22/2015  . Arthritis 02/22/2015  . Weakness   . Dehydration 02/21/2015  . ARF (acute renal failure) (HCC) 10/03/2014  . Underweight 07/20/2014  . Acute bronchitis 07/20/2014  . COPD GOLD I with reversibility  07/18/2014  . Essential hypertension 07/18/2014  . Gout 07/18/2014  . SBO  (small bowel obstruction) (HCC) 01/14/2014  . Hypercalcemia 01/14/2014  . Protein-calorie malnutrition, severe (HCC) 10/10/2013  . Acute respiratory failure (HCC) 10/07/2013  . Hypokalemia 10/07/2013  . H. influenzae septicemia (HCC) 12/06/2012  . Leukocytopenia, unspecified 12/03/2012  . HTN (hypertension) 12/02/2012  . Gouty arthritis 12/02/2012    Past Surgical History:  Procedure Laterality Date  . APPENDECTOMY  1945  . CATARACT EXTRACTION W/ INTRAOCULAR LENS  IMPLANT, BILATERAL Bilateral   . FRACTURE SURGERY    . HIP FRACTURE SURGERY     "don't remember which side"  . TEE WITHOUT CARDIOVERSION N/A 10/13/2013   Procedure: TRANSESOPHAGEAL ECHOCARDIOGRAM (TEE);  Surgeon: Quintella Reichert, MD;  Location: [facility] ENDOSCOPY;  Service: Cardiovascular;  Laterality: N/A;    OB History    No data available       Home Medications    Prior to Admission medications   Medication Sig Start Date End Date Taking? Authorizing Provider  allopurinol (ZYLOPRIM) 300 MG tablet Take 300 mg by mouth daily.    Yes Historical Provider, MD  atenolol (TENORMIN) 25 MG tablet Take 25 mg by mouth at bedtime.    Yes Historical Provider, MD  atorvastatin (LIPITOR) 20 MG tablet Take 20 mg by mouth at bedtime.   Yes Historical Provider, MD  losartan (COZAAR) 100 MG tablet Take 100 mg by mouth daily.    Yes Historical Provider, MD  OLANZapine (ZYPREXA) 5 MG tablet Take 5 mg  by mouth at bedtime. 07/11/16  Yes Historical Provider, MD  pantoprazole (PROTONIX) 40 MG tablet TAKE 1 TABLET BY MOUTH EVERY DAY 30-60 MINUTES BEFORE FIRST MEAL OF THE DAY Patient taking differently: Take 40 mg by mouth once a day 10/22/15  Yes Nyoka Cowden, MD  acetaminophen (TYLENOL) 325 MG tablet Take 2 tablets (650 mg total) by mouth every 6 (six) hours as needed for mild pain (or Fever >/= 101). Patient not taking: Reported on 08/15/2016 03/21/16   Belkys A Regalado, MD  albuterol (PROVENTIL HFA;VENTOLIN HFA) 108 (90 Base) MCG/ACT inhaler  Inhale 2 puffs into the lungs every 4 (four) hours as needed for wheezing or shortness of breath. 12/01/16   Cristan Hout Pilcher Jahzier Villalon, PA-C  budesonide (PULMICORT) 0.25 MG/2ML nebulizer solution Take 2 mLs (0.25 mg total) by nebulization 2 (two) times daily. Patient not taking: Reported on 08/15/2016 10/12/15   Nyoka Cowden, MD  donepezil (ARICEPT) 5 MG tablet Take 1 tablet (5 mg total) by mouth at bedtime. Patient not taking: Reported on 08/15/2016 04/16/16   Rolly Salter, MD  ipratropium-albuterol (DUONEB) 0.5-2.5 (3) MG/3ML SOLN Take 3 mLs by nebulization every 6 (six) hours as needed. 12/01/16   Chase Picket Loetta Connelley, PA-C  meclizine (ANTIVERT) 25 MG tablet Take 0.5 tablets (12.5 mg total) by mouth 3 (three) times daily as needed for dizziness. Patient not taking: Reported on 08/15/2016 01/05/16   Arby Barrette, MD  polyethylene glycol [facility] / GLYCOLAX) packet Take 17 g by mouth daily as needed for mild constipation. Patient not taking: Reported on 08/15/2016 04/16/16   Rolly Salter, MD  predniSONE (DELTASONE) 20 MG tablet Take 2 tablets (40 mg total) by mouth daily. Patient not taking: Reported on 08/15/2016 04/18/16   Ace Gins Sam, PA-C  predniSONE (DELTASONE) 20 MG tablet Take 2 tablets (40 mg total) by mouth daily. Patient not taking: Reported on 08/15/2016 05/12/16   Lorre Nick, MD    Family History Family History  Problem Relation Age of Onset  . Cancer Mother   . Stroke Father   . Cirrhosis Brother     Social History Social History  Substance Use Topics  . Smoking status: Former Smoker    Packs/day: 0.50    Years: 70.00    Quit date: 08/18/2008  . Smokeless tobacco: Former Neurosurgeon    Quit date: 10/13/2004  . Alcohol use No     Allergies   Penicillins   Review of Systems Review of Systems  Unable to perform ROS: Dementia     Physical Exam Updated Vital Signs BP (!) 144/85   Pulse 68   Temp 97.3 F (36.3 C) (Rectal)   Resp (!) 26   SpO2 99%   Physical Exam    Constitutional: She appears well-developed and well-nourished. No distress.  Pleasantly confused.   HENT:  Head: Normocephalic and atraumatic.  Eyes:  Tracks across the room appropriately. No visual field deficits appreciated.   Cardiovascular: Normal rate, regular rhythm and normal heart sounds.   No murmur heard. Pulmonary/Chest: Effort normal. No respiratory distress.  Harsh cough. No wheezing or crackles.   Abdominal: Soft. She exhibits no distension. There is no tenderness.  Musculoskeletal: She exhibits no edema.  Neurological:  Alert to self only. Good sensation in all extremities. Good grip strength and upper extremity muscle strength. Will move lower extremities without difficulty, but unable to hold up legs against gravity.   Skin: Skin is warm and dry.  Nursing note and vitals reviewed.  ED Treatments / Results  Labs (all labs ordered are listed, but only abnormal results are displayed) Labs Reviewed  BASIC METABOLIC PANEL - Abnormal; Notable for the following:       Result Value   BUN 27 (*)    Creatinine, Ser 1.04 (*)    GFR calc non Af Amer 46 (*)    GFR calc Af Amer 53 (*)    All other components within normal limits  CBC WITH DIFFERENTIAL/PLATELET - Abnormal; Notable for the following:    WBC 16.9 (*)    RBC 3.71 (*)    Hemoglobin 11.1 (*)    HCT 34.1 (*)    Neutro Abs 14.6 (*)    All other components within normal limits  URINALYSIS, ROUTINE W REFLEX MICROSCOPIC  I-STAT TROPOININ, ED    EKG  EKG Interpretation  Date/Time:  Monday December 01 2016 11:05:07 EDT Ventricular Rate:  72 PR Interval:    QRS Duration: 87 QT Interval:  401 QTC Calculation: 439 R Axis:   10 Text Interpretation:  SR no sig change from previous Confirmed by Donnald Garre, MD, Lebron Conners [phone]) on 12/01/2016 1:31:51 PM       Radiology Dg Chest 2 View  Result Date: 12/01/2016 CLINICAL DATA:  Altered mental status. EXAM: CHEST  2 VIEW COMPARISON:  Radiographs of May 20, 2016.  FINDINGS: The heart size and mediastinal contours are within normal limits. Atherosclerosis of thoracic aorta is noted. No pneumothorax or pleural effusion is noted. Both lungs are clear. The visualized skeletal structures are unremarkable. IMPRESSION: No active cardiopulmonary disease.  Aortic atherosclerosis. Electronically Signed   By: Lupita Raider, M.D.   On: 12/01/2016 11:49   Ct Head Wo Contrast  Result Date: 12/01/2016 CLINICAL DATA:  Pt awoke very confused today, with fecal incontinence; pt denies any recent injury, and has no indication of one. Pt denies h/a EXAM: CT HEAD WITHOUT CONTRAST TECHNIQUE: Contiguous axial images were obtained from the base of the skull through the vertex without intravenous contrast. COMPARISON:  04/23/2016 FINDINGS: Brain: No acute intracranial hemorrhage. No focal mass lesion. No CT evidence of acute infarction. No midline shift or mass effect. No hydrocephalus. Basilar cisterns are patent. Minimal periventricular white matter hypodensity.  Mild atrophy. Potential remote infarction in the RIGHT cerebellum. Vascular: No hyperdense vessel or unexpected calcification. Skull: Normal. Negative for fracture or focal lesion. Sinuses/Orbits: Paranasal sinuses and mastoid air cells are clear. Orbits are clear. Other: None. IMPRESSION: 1. No acute intracranial findings. 2. Minimal atrophy and white matter microvascular disease. 3. Remote RIGHT cerebellum infarction Electronically Signed   By: Genevive Bi M.D.   On: 12/01/2016 12:11    Procedures Procedures (including critical care time)  Medications Ordered in ED Medications  0.9 %  sodium chloride infusion (not administered)  sodium chloride 0.9 % bolus 500 mL (500 mLs Intravenous New Bag/Given 12/01/16 1314)     Initial Impression / Assessment and Plan / ED Course  I have reviewed the triage vital signs and the nursing notes.  Pertinent labs & imaging results that were available during my care of the patient  were reviewed by me and considered in my medical decision making (see chart for details).    QUYEN CUTSFORTH is a 83 y.o. female who presents to ED from facility in [place] after being found in her feces and urine this morning by AM nursing staff. Per facility, stool was non-bloody but loose. No recent ABX use. She was less responsive than usual and would  not walk (typically ambulatory). EMS was called to bring to ER for evaluation. CT head and CXR with no acute findings. She does have elevated white count of 16.9. AKI with CUN/Cr of 27/1.04. Dry cracked lips on exam. Likely due to dehydration. UA wdl.   12:53 PM: Power of attorney at bedside. She states that Ms. Carnevale is at her baseline mental status. She's been interactive and talking to her as usual. Facility called her today and informed her that she had been incontinent of loose stools and was not as interactive as usual, therefore sent her to ER. States her cough is baseline with her history of COPD and she typically takes albuterol as needed for this. She has not been eating or drinking as usual. POA, Tiffany, just went to buy boost to bring to facility as she usually enjoys this.   Evaluation does not show pathology that would require ongoing emergent intervention or inpatient treatment. Patient appears to be at baseline. Refilled albuterol inhaler/neb to use PRN at facility. No loose stools while in ED today. Rectal exam with no impaction. If patient does continue to have loose stools, recommended facility obtain sample to send off for c. Diff. Follow up with PCP this week for recheck of pulmonary symptoms. All questions answered.   Patient seen by and discussed with Dr. Donnald Garre who agrees with treatment plan.    Final Clinical Impressions(s) / ED Diagnoses   Final diagnoses:  Altered mental status  Dehydration  AKI (acute kidney injury) [facility])    New Prescriptions Current Discharge Medication List       [facility] Shakevia Sarris,  PA-C 12/01/16 1509    Arby Barrette, MD 12/02/16 [phone]

## 2016-12-01 NOTE — ED Triage Notes (Signed)
Patient is from a group home and was found to be altered and incontinent of feces this AM around 7:30. Patient is confused and very aphasic. No complaints of pain and vitals stable.

## 2016-12-01 NOTE — ED Notes (Signed)
Pt in and out cathed. Tolerated well. Urine sample sent

## 2016-12-01 NOTE — ED Notes (Signed)
TIFFANY HCPOA [phone]

## 2016-12-01 NOTE — ED Notes (Signed)
Patient transported to X-ray 

## 2016-12-01 NOTE — Discharge Instructions (Signed)
It was my pleasure taking care of you today!   Albuterol (either inhaler or nebulizer) every 4-6 hours as needed for cough.  If she has loose stools, I would recommend sending a sample to the lab.   Tests performed today: CT head and chest x-ray with no acute findings. Urine with no signs of infection. White count elevated at 16, mild elevation in creatinine (likely due to dehydration) - 1.04.    Please follow up with your primary doctor this week for further discussion about your hospital visit today.  Please return to the ER for new or worsening symptoms, any additional concerns.

## 2016-12-13 ENCOUNTER — Encounter: Payer: Self-pay | Admitting: Medical Oncology

## 2016-12-13 ENCOUNTER — Emergency Department: Payer: Medicare HMO

## 2016-12-13 ENCOUNTER — Emergency Department
Admission: EM | Admit: 2016-12-13 | Discharge: 2016-12-14 | Disposition: A | Discharge status: Home / Self Care | Payer: Medicare HMO | Attending: Emergency Medicine | Admitting: Emergency Medicine

## 2016-12-13 DIAGNOSIS — W0110XA Fall on same level from slipping, tripping and stumbling with subsequent striking against unspecified object, initial encounter: Secondary | ICD-10-CM | POA: Insufficient documentation

## 2016-12-13 DIAGNOSIS — I10 Essential (primary) hypertension: Secondary | ICD-10-CM | POA: Insufficient documentation

## 2016-12-13 DIAGNOSIS — Z87891 Personal history of nicotine dependence: Secondary | ICD-10-CM | POA: Insufficient documentation

## 2016-12-13 DIAGNOSIS — S41112A Laceration without foreign body of left upper arm, initial encounter: Secondary | ICD-10-CM | POA: Insufficient documentation

## 2016-12-13 DIAGNOSIS — Y939 Activity, unspecified: Secondary | ICD-10-CM | POA: Insufficient documentation

## 2016-12-13 DIAGNOSIS — Z23 Encounter for immunization: Secondary | ICD-10-CM | POA: Diagnosis not present

## 2016-12-13 DIAGNOSIS — J449 Chronic obstructive pulmonary disease, unspecified: Secondary | ICD-10-CM | POA: Insufficient documentation

## 2016-12-13 DIAGNOSIS — Z79899 Other long term (current) drug therapy: Secondary | ICD-10-CM | POA: Insufficient documentation

## 2016-12-13 DIAGNOSIS — Y999 Unspecified external cause status: Secondary | ICD-10-CM | POA: Diagnosis not present

## 2016-12-13 DIAGNOSIS — S4992XA Unspecified injury of left shoulder and upper arm, initial encounter: Secondary | ICD-10-CM | POA: Diagnosis present

## 2016-12-13 DIAGNOSIS — S0990XA Unspecified injury of head, initial encounter: Secondary | ICD-10-CM | POA: Insufficient documentation

## 2016-12-13 DIAGNOSIS — Y92009 Unspecified place in unspecified non-institutional (private) residence as the place of occurrence of the external cause: Secondary | ICD-10-CM | POA: Diagnosis not present

## 2016-12-13 MED ORDER — BACITRACIN-NEOMYCIN-POLYMYXIN 400-5-5000 EX OINT
TOPICAL_OINTMENT | CUTANEOUS | Status: AC
  Administered 2016-12-13: 1 via TOPICAL
  Filled 2016-12-13: qty 1

## 2016-12-13 MED ORDER — TETANUS-DIPHTH-ACELL PERTUSSIS 5-2.5-18.5 LF-MCG/0.5 IM SUSP
0.5000 mL | Freq: Once | INTRAMUSCULAR | Status: AC
  Administered 2016-12-13: 0.5 mL via INTRAMUSCULAR
  Filled 2016-12-13: qty 0.5

## 2016-12-13 MED ORDER — BACITRACIN-NEOMYCIN-POLYMYXIN 400-5-5000 EX OINT
TOPICAL_OINTMENT | Freq: Two times a day (BID) | CUTANEOUS | Status: DC
  Administered 2016-12-13: 1 via TOPICAL

## 2016-12-13 MED ORDER — BACITRACIN-NEOMYCIN-POLYMYXIN 400-5-5000 EX OINT: 1.0000 "application " | TOPICAL_OINTMENT | Freq: Two times a day (BID) | CUTANEOUS | 0 refills | Status: DC

## 2016-12-13 MED ORDER — DOUBLE ANTIBIOTIC 500-10000 UNIT/GM EX OINT: TOPICAL_OINTMENT | Freq: Two times a day (BID) | CUTANEOUS | Status: DC

## 2016-12-13 NOTE — ED Triage Notes (Signed)
Pt reports that she tripped and fell this am and has skin tear to lt upper arm. Pt denies hitting head. No use of blood thinners, pt here with caregiver.

## 2016-12-13 NOTE — ED Provider Notes (Signed)
[facility] Emergency Department Provider Note  ____________________________________________  Time seen: Approximately 9:37 AM  I have reviewed the triage vital signs and the nursing notes.   HISTORY  Chief Complaint Fall    HPI [name] is a 83 y.o. female that presents to emergency department with left arm abrasion. Patient has dementia and is unable to provide an accurate history. She is here with her caregiver. Caregiver states that patient got out of bed earlier than usual this morning and was found pulling herself up in the bathroom. She is unsure if she hit her head. Caregiver states that patient has been acting like herself. They are here because they're concerned for a skin tear on her left arm.   Past Medical History:  Diagnosis Date  . Arthritis   . Chronic bronchitis (HCC)   . COPD (chronic obstructive pulmonary disease) (HCC)   . GERD (gastroesophageal reflux disease)   . Gout   . H/O hiatal hernia   . High cholesterol   . Hypertension     Patient Active Problem List   Diagnosis Date Noted  . Failure to thrive in adult 04/15/2016  . Acute encephalopathy 04/15/2016  . GERD (gastroesophageal reflux disease) 04/15/2016  . Orthostatic hypotension 03/18/2016  . Orthostatic dizziness 03/18/2016  . Postinflammatory pulmonary fibrosis (HCC) 04/29/2015  . Dyspnea 04/26/2015  . Severe malnutrition (HCC) 02/23/2015  . AKI (acute kidney injury) (HCC) 02/22/2015  . Arthritis 02/22/2015  . Weakness   . Dehydration 02/21/2015  . ARF (acute renal failure) (HCC) 10/03/2014  . Underweight 07/20/2014  . Acute bronchitis 07/20/2014  . COPD GOLD I with reversibility  07/18/2014  . Essential hypertension 07/18/2014  . Gout 07/18/2014  . SBO (small bowel obstruction) (HCC) 01/14/2014  . Hypercalcemia 01/14/2014  . Protein-calorie malnutrition, severe (HCC) 10/10/2013  . Acute respiratory failure (HCC) 10/07/2013  . Hypokalemia 10/07/2013  .  H. influenzae septicemia (HCC) 12/06/2012  . Leukocytopenia, unspecified 12/03/2012  . HTN (hypertension) 12/02/2012  . Gouty arthritis 12/02/2012    Past Surgical History:  Procedure Laterality Date  . APPENDECTOMY  1945  . CATARACT EXTRACTION W/ INTRAOCULAR LENS  IMPLANT, BILATERAL Bilateral   . FRACTURE SURGERY    . HIP FRACTURE SURGERY     "don't remember which side"  . TEE WITHOUT CARDIOVERSION N/A 10/13/2013   Procedure: TRANSESOPHAGEAL ECHOCARDIOGRAM (TEE);  Surgeon: Quintella Reichert, MD;  Location: [facility] ENDOSCOPY;  Service: Cardiovascular;  Laterality: N/A;    Prior to Admission medications   Medication Sig Start Date End Date Taking? Authorizing Provider  acetaminophen (TYLENOL) 325 MG tablet Take 2 tablets (650 mg total) by mouth every 6 (six) hours as needed for mild pain (or Fever >/= 101). Patient not taking: Reported on 08/15/2016 03/21/16   Belkys A Regalado, MD  albuterol (PROVENTIL HFA;VENTOLIN HFA) 108 (90 Base) MCG/ACT inhaler Inhale 2 puffs into the lungs every 4 (four) hours as needed for wheezing or shortness of breath. 12/01/16   Chase Picket Ward, PA-C  allopurinol (ZYLOPRIM) 300 MG tablet Take 300 mg by mouth daily.     Historical Provider, MD  atenolol (TENORMIN) 25 MG tablet Take 25 mg by mouth at bedtime.     Historical Provider, MD  atorvastatin (LIPITOR) 20 MG tablet Take 20 mg by mouth at bedtime.    Historical Provider, MD  budesonide (PULMICORT) 0.25 MG/2ML nebulizer solution Take 2 mLs (0.25 mg total) by nebulization 2 (two) times daily. Patient not taking: Reported on 08/15/2016 10/12/15  Nyoka Cowden, MD  donepezil (ARICEPT) 5 MG tablet Take 1 tablet (5 mg total) by mouth at bedtime. Patient not taking: Reported on 08/15/2016 04/16/16   Rolly Salter, MD  ipratropium-albuterol (DUONEB) 0.5-2.5 (3) MG/3ML SOLN Take 3 mLs by nebulization every 6 (six) hours as needed. 12/01/16   Chase Picket Ward, PA-C  losartan (COZAAR) 100 MG tablet Take 100 mg by mouth  daily.     Historical Provider, MD  meclizine (ANTIVERT) 25 MG tablet Take 0.5 tablets (12.5 mg total) by mouth 3 (three) times daily as needed for dizziness. Patient not taking: Reported on 08/15/2016 01/05/16   Arby Barrette, MD  neomycin-bacitracin-polymyxin (NEOSPORIN) ointment Apply 1 application topically every 12 (twelve) hours. apply to eye 12/13/16   Enid Derry, PA-C  OLANZapine (ZYPREXA) 5 MG tablet Take 5 mg by mouth at bedtime. 07/11/16   Historical Provider, MD  pantoprazole (PROTONIX) 40 MG tablet TAKE 1 TABLET BY MOUTH EVERY DAY 30-60 MINUTES BEFORE FIRST MEAL OF THE DAY Patient taking differently: Take 40 mg by mouth once a day 10/22/15   Nyoka Cowden, MD  polyethylene glycol (MIRALAX / GLYCOLAX) packet Take 17 g by mouth daily as needed for mild constipation. Patient not taking: Reported on 08/15/2016 04/16/16   Rolly Salter, MD  predniSONE (DELTASONE) 20 MG tablet Take 2 tablets (40 mg total) by mouth daily. Patient not taking: Reported on 08/15/2016 04/18/16   Ace Gins Sam, PA-C  predniSONE (DELTASONE) 20 MG tablet Take 2 tablets (40 mg total) by mouth daily. Patient not taking: Reported on 08/15/2016 05/12/16   Lorre Nick, MD    Allergies Penicillins  Family History  Problem Relation Age of Onset  . Cancer Mother   . Stroke Father   . Cirrhosis Brother     Social History Social History  Substance Use Topics  . Smoking status: Former Smoker    Packs/day: 0.50    Years: 70.00    Quit date: 08/18/2008  . Smokeless tobacco: Former Neurosurgeon    Quit date: 10/13/2004  . Alcohol use No     Review of Systems  Cardiovascular: No chest pain. Respiratory: No SOB. Gastrointestinal: No abdominal pain.  No nausea, no vomiting.  Skin: Negative for rash, ecchymosis. Positive for abrasion. Neurological: Negative for headaches, numbness or tingling   ____________________________________________   PHYSICAL EXAM:  VITAL SIGNS: ED Triage Vitals [12/13/16 0820]  Enc  Vitals Group     BP 119/72     Pulse Rate 75     Resp 18     Temp 97.5 F (36.4 C)     Temp Source Oral     SpO2 100 %     Weight 90 lb (40.8 kg)     Height  (1.575 m)     Head Circumference      Peak Flow      Pain Score      Pain Loc      Pain Edu?      Excl. in GC?      Constitutional: Alert Eyes: Conjunctivae are normal. PERRL. EOMI. Head: Atraumatic. ENT:      Ears:      Nose: No congestion/rhinnorhea.      Mouth/Throat: Mucous membranes are moist.  Neck: No stridor.   Cardiovascular: Normal rate, regular rhythm.  Good peripheral circulation. Respiratory: Normal respiratory effort without tachypnea or retractions. Lungs CTAB. Good air entry to the bases with no decreased or absent breath sounds. Gastrointestinal: Bowel sounds 4  quadrants. Soft and nontender to palpation. Musculoskeletal: Full range of motion to all extremities. No gross deformities appreciated. Neurologic:  Normal speech and language. No gross focal neurologic deficits are appreciated.  Skin:  Skin is warm, dry. 3 inch skin tear to upper left arm. Skin able to be pulled back over wound to cover. No bleeding.  ____________________________________________   LABS (all labs ordered are listed, but only abnormal results are displayed)  Labs Reviewed - No data to display ____________________________________________  EKG   ____________________________________________  RADIOLOGY  Ct Head Wo Contrast  Result Date: 12/13/2016 CLINICAL DATA:  Patient reports trip and fall, with laceration to arm. Patient denies hitting head. No reported loss of consciousness. EXAM: CT HEAD WITHOUT CONTRAST TECHNIQUE: Contiguous axial images were obtained from the base of the skull through the vertex without intravenous contrast. COMPARISON:  12/01/2016. FINDINGS: Brain: No evidence for acute infarction, hemorrhage, mass lesion, hydrocephalus, or extra-axial fluid. Normal for age cerebral volume. Hypoattenuation of  white matter, favored to represent chronic microvascular ischemic change. Old RIGHT cerebellar infarct. Probable old lacunar infarct LEFT basal ganglia. Vascular: Advanced vascular calcification in the skull base internal carotid arteries and distal vertebral arteries. No signs of emergent large vessel occlusion. Skull: Normal. Negative for fracture or focal lesion. Sinuses/Orbits: There is sinus opacity in the sphenoid, and ethmoid sinuses incompletely evaluated. No layering fluid or definite blowout injury. Visualized orbits appear unremarkable. Other: None. Compared with recent priors, similar appearance. IMPRESSION: Atrophy and small vessel disease.  No acute intracranial findings. No skull fracture or intracranial hemorrhage. Electronically Signed   By: Elsie Stain M.D.   On: 12/13/2016 10:46    ____________________________________________    PROCEDURES  Procedure(s) performed:    Procedures  Neosporin was applied to skin tear. Non-adhered dressing was applied and the arm was wrapped with gauze.  Medications  neomycin-bacitracin-polymyxin (NEOSPORIN) ointment (1 application Topical Given 12/13/16 0958)  Tdap (BOOSTRIX) injection 0.5 mL (0.5 mLs Intramuscular Given 12/13/16 0945)     ____________________________________________   INITIAL IMPRESSION / ASSESSMENT AND PLAN / ED COURSE  Pertinent labs & imaging results that were available during my care of the patient were reviewed by me and considered in my medical decision making (see chart for details).  Review of the [place] CSRS was performed in accordance of the [facility] prior to dispensing any controlled drugs.  Patient's diagnosis is consistent with skin tear. Vital signs and exam are reassuring. Head CT negative for acute abnormalities. Neosporin was applied to wound and it was dressed. Education was provided. Caregiver has no other questions or concerns. Patient is to follow up with wound care as directed. Patient is given ED  precautions to return to the ED for any worsening or new symptoms.     ____________________________________________  FINAL CLINICAL IMPRESSION(S) / ED DIAGNOSES  Final diagnoses:  Skin tear of left upper arm without complication, initial encounter      NEW MEDICATIONS STARTED DURING THIS VISIT:  New Prescriptions   NEOMYCIN-BACITRACIN-POLYMYXIN (NEOSPORIN) OINTMENT    Apply 1 application topically every 12 (twelve) hours. apply to eye        This chart was dictated using voice recognition software/Dragon. Despite best efforts to proofread, errors can occur which can change the meaning. Any change was purely unintentional.    Enid Derry, PA-C 12/13/16 1115    Jeanmarie Plant, MD 12/14/16 [phone]

## 2016-12-13 NOTE — ED Notes (Signed)
Patient presents to the ED with a skin tear to her left arm from a family care home.  Staff reports patient got up this am prior to staff coming in the room and fell unwitnessed.  Patient is alert and oriented to self and place but not time.  Patient reports hitting her forehead when she fell.  Patient has dementia at baseline.  Patient is in no obvious distress at this time.

## 2016-12-17 ENCOUNTER — Ambulatory Visit: Payer: Medicare HMO | Admitting: Internal Medicine

## 2016-12-18 ENCOUNTER — Encounter: Payer: Medicare HMO | Attending: Surgery | Admitting: Surgery

## 2016-12-18 ENCOUNTER — Ambulatory Visit: Payer: Medicare HMO | Admitting: Surgery

## 2016-12-18 DIAGNOSIS — J449 Chronic obstructive pulmonary disease, unspecified: Secondary | ICD-10-CM | POA: Diagnosis not present

## 2016-12-18 DIAGNOSIS — S41102A Unspecified open wound of left upper arm, initial encounter: Secondary | ICD-10-CM | POA: Diagnosis not present

## 2016-12-18 DIAGNOSIS — X58XXXA Exposure to other specified factors, initial encounter: Secondary | ICD-10-CM | POA: Diagnosis not present

## 2016-12-18 DIAGNOSIS — J8410 Pulmonary fibrosis, unspecified: Secondary | ICD-10-CM | POA: Diagnosis not present

## 2016-12-18 DIAGNOSIS — S41112A Laceration without foreign body of left upper arm, initial encounter: Secondary | ICD-10-CM | POA: Insufficient documentation

## 2016-12-18 DIAGNOSIS — K219 Gastro-esophageal reflux disease without esophagitis: Secondary | ICD-10-CM | POA: Insufficient documentation

## 2016-12-18 DIAGNOSIS — M109 Gout, unspecified: Secondary | ICD-10-CM | POA: Diagnosis not present

## 2016-12-18 DIAGNOSIS — E440 Moderate protein-calorie malnutrition: Secondary | ICD-10-CM | POA: Diagnosis not present

## 2016-12-18 DIAGNOSIS — I10 Essential (primary) hypertension: Secondary | ICD-10-CM | POA: Diagnosis not present

## 2016-12-18 DIAGNOSIS — M1990 Unspecified osteoarthritis, unspecified site: Secondary | ICD-10-CM | POA: Diagnosis not present

## 2016-12-18 DIAGNOSIS — F0390 Unspecified dementia without behavioral disturbance: Secondary | ICD-10-CM | POA: Diagnosis not present

## 2016-12-20 NOTE — Progress Notes (Signed)
[name], [name]. ([id number]) Visit Report for 12/18/2016 Allergy List Details Patient Name: [name], [name]. Date of Service: 12/18/2016 12:45 PM Medical Record Number: [id number] Patient Account Number: [id number] Date of Birth/Sex: 05/28/1923 (83 y.o. Female) Treating RN: Huel CoventryWoody, Kim Primary Care Sabiha Sura: [name], [name] Other Clinician: Referring Kylon Philbrook: Ileana RoupMCSHANE, [name] Treating Irfan Veal/Extender: Rudene ReBritto, Errol Weeks in Treatment: 0 Allergies Active Allergies No Known Drug Allergies Allergy Notes Electronic Signature(s) Signed: 12/18/2016 5:54:52 PM By: Elliot GurneyWoody, RN, BSN, Kim RN, BSN Entered By: Elliot GurneyWoody, RN, BSN, Kim on 12/18/2016 14:06:43 Yohn, Larinda ButteryALLIE [name]. ([id number]) -------------------------------------------------------------------------------- Arrival Information Details Patient Name: [name]. Date of Service: 12/18/2016 12:45 PM Medical Record Number: [id number] Patient Account Number: [id number] Date of Birth/Sex: 05/31/1923 (83 y.o. Female) Treating RN: Huel CoventryWoody, Kim Primary Care Alveena Taira: [name], [name] Other Clinician: Referring Lillyahna Hemberger: Ileana RoupMCSHANE, [name] Treating Kanden Carey/Extender: Rudene ReBritto, Errol Weeks in Treatment: 0 Visit Information Patient Arrived: Ambulatory Arrival Time: 12:59 Accompanied By: Corrie DandyMary, caregiver Transfer Assistance: Manual Patient Identification Verified: Yes Secondary Verification Process Yes Completed: Patient Has Alerts: No Electronic Signature(s) Signed: 12/18/2016 5:54:52 PM By: Elliot GurneyWoody, RN, BSN, Kim RN, BSN Entered By: Elliot GurneyWoody, RN, BSN, Kim on 12/18/2016 14:04:27 Fedrick, Larinda ButteryALLIE [name]. ([id number]) -------------------------------------------------------------------------------- Clinic Level of Care Assessment Details Patient Name: [name]. Date of Service: 12/18/2016 12:45 PM Medical Record Number: [id number] Patient Account Number: [id number] Date of Birth/Sex: 10/09/1922 (83 y.o. Female) Treating RN: Huel CoventryWoody, Kim Primary Care Cheryel Kyte: [name],  [name] Other Clinician: Referring Levi Klaiber: Ileana RoupMCSHANE, [name] Treating Blakelee Allington/Extender: Rudene ReBritto, Errol Weeks in Treatment: 0 Clinic Level of Care Assessment Items TOOL 1 Quantity Score []  - Use when EandM and Procedure is performed on INITIAL visit 0 ASSESSMENTS - Nursing Assessment / Reassessment X - General Physical Exam (combine w/ comprehensive assessment (listed just 1 20 below) when performed on new pt. evals) X - Comprehensive Assessment (HX, ROS, Risk Assessments, Wounds Hx, etc.) 1 25 ASSESSMENTS - Wound and Skin Assessment / Reassessment []  - Dermatologic / Skin Assessment (not related to wound area) 0 ASSESSMENTS - Ostomy and/or Continence Assessment and Care []  - Incontinence Assessment and Management 0 []  - Ostomy Care Assessment and Management (repouching, etc.) 0 PROCESS - Coordination of Care X - Simple Patient / Family Education for ongoing care 1 15 []  - Complex (extensive) Patient / Family Education for ongoing care 0 X - Staff obtains ChiropractorConsents, Records, Test Results / Process Orders 1 10 []  - Staff telephones HHA, Nursing Homes / Clarify orders / etc 0 []  - Routine Transfer to another Facility (non-emergent condition) 0 []  - Routine Hospital Admission (non-emergent condition) 0 X - New Admissions / Manufacturing engineernsurance Authorizations / Ordering NPWT, Apligraf, etc. 1 15 []  - Emergency Hospital Admission (emergent condition) 0 PROCESS - Special Needs []  - Pediatric / Minor Patient Management 0 []  - Isolation Patient Management 0 [name]. ([id number]) []  - Hearing / Language / Visual special needs 0 []  - Assessment of Community assistance (transportation, D/C planning, etc.) 0 []  - Additional assistance / Altered mentation 0 []  - Support Surface(s) Assessment (bed, cushion, seat, etc.) 0 INTERVENTIONS - Miscellaneous []  - External ear exam 0 []  - Patient Transfer (multiple staff / Nurse, adultHoyer Lift / Similar devices) 0 []  - Simple Staple / Suture removal (25 or less) 0 []   - Complex Staple / Suture removal (26 or more) 0 []  - Hypo/Hyperglycemic Management (do not check if billed separately) 0 []  - Ankle / Brachial Index (ABI) - do not check if billed separately 0 Has the patient been seen at the  hospital within the last three years: Yes Total Score: 85 Level Of Care: New/Established - Level 3 Electronic Signature(s) Signed: 12/19/2016 10:28:07 AM By: Elliot Gurney, RN, BSN, Kim RN, BSN Previous Signature: 12/18/2016 5:54:52 PM Version By: Elliot Gurney, RN, BSN, Kim RN, BSN Entered By: Elliot Gurney, RN, BSN, Kim on 12/19/2016 07:59:08 Urquidi, Larinda Buttery ([id number]) -------------------------------------------------------------------------------- Encounter Discharge Information Details Patient Name: [name]. Date of Service: 12/18/2016 12:45 PM Medical Record Number: [id number] Patient Account Number: [id number] Date of Birth/Sex: Jan 01, 1923 (83 y.o. Female) Treating RN: Huel Coventry Primary Care Ebonye Reade: Pearson Grippe Other Clinician: Referring Kimberly Nieland: Ileana Roup Treating Hiilei Gerst/Extender: Rudene Re in Treatment: 0 Encounter Discharge Information Items Discharge Pain Level: 2 Discharge Condition: Stable Ambulatory Status: Ambulatory Discharge Destination: Home Transportation: Private Auto caregiver, Accompanied ByCorrie Dandy Schedule Follow-up Appointment: Yes Medication Reconciliation completed and provided to Patient/Care Yes Tipton Ballow: Provided on Clinical Summary of Care: 12/18/2016 Form Type Recipient Paper Patient CS Electronic Signature(s) Signed: 12/19/2016 10:28:07 AM By: Elliot Gurney RN, BSN, Kim RN, BSN Previous Signature: 12/18/2016 5:54:52 PM Version By: Elliot Gurney RN, BSN, Kim RN, BSN Previous Signature: 12/18/2016 1:49:56 PM Version By: Gwenlyn Perking Entered By: Elliot Gurney RN, BSN, Kim on 12/19/2016 08:00:56 Lemay, Larinda Buttery ([id number]) -------------------------------------------------------------------------------- Lower Extremity Assessment  Details Patient Name: Schrodt, Sande [name]. Date of Service: 12/18/2016 12:45 PM Medical Record Number: [id number] Patient Account Number: [id number] Date of Birth/Sex: 1923-02-07 (83 y.o. Female) Treating RN: Huel Coventry Primary Care Willowdean Luhmann: Pearson Grippe Other Clinician: Referring Magaby Rumberger: Ileana Roup Treating Zedric Deroy/Extender: Rudene Re in Treatment: 0 Electronic Signature(s) Signed: 12/18/2016 5:54:52 PM By: Elliot Gurney, RN, BSN, Kim RN, BSN Entered By: Elliot Gurney, RN, BSN, Kim on 12/18/2016 14:06:37 Rossy, Larinda Buttery ([id number]) -------------------------------------------------------------------------------- Multi Wound Chart Details Patient Name: Pryde, Ariatna [name]. Date of Service: 12/18/2016 12:45 PM Medical Record Number: [id number] Patient Account Number: [id number] Date of Birth/Sex: 08/23/1922 (83 y.o. Female) Treating RN: Huel Coventry Primary Care Taiwana Willison: Pearson Grippe Other Clinician: Referring Eleesha Purkey: Ileana Roup Treating Copper Basnett/Extender: Rudene Re in Treatment: 0 Vital Signs Height(in): 60 Pulse(bpm): 67 Weight(lbs): Blood Pressure 104/85 (mmHg): Body Mass Index(BMI): Temperature(F): 97.4 Respiratory Rate 16 (breaths/min): Photos: [N/A:N/A] Wound Location: Left Upper Arm - Lateral N/A N/A Wounding Event: Trauma N/A N/A Primary Etiology: Skin Tear N/A N/A Date Acquired: 12/13/2016 N/A N/A Weeks of Treatment: 0 N/A N/A Wound Status: Open N/A N/A Measurements L x W x D 4.6x6x0.1 N/A N/A (cm) Area (cm) : 21.677 N/A N/A Volume (cm) : 2.168 N/A N/A % Reduction in Area: 0.00% N/A N/A % Reduction in Volume: 0.00% N/A N/A Classification: Partial Thickness N/A N/A Exudate Amount: Small N/A N/A Exudate Type: Serous N/A N/A Exudate Color: amber N/A N/A Wound Margin: Flat and Intact N/A N/A Granulation Amount: Large (67-100%) N/A N/A Necrotic Amount: None Present (0%) N/A N/A Exposed Structures: Fascia: No N/A N/A Fat Layer (Subcutaneous Tissue)  Exposed: No Tendon: No Muscle: No Cotta, Andria [name]. ([id number]) Joint: No Bone: No Limited to Skin Breakdown Epithelialization: Medium (34-66%) N/A N/A Debridement: Debridement ([id number]- N/A N/A 11047) Pre-procedure 13:30 N/A N/A Verification/Time Out Taken: Pain Control: Other N/A N/A Tissue Debrided: Skin, Subcutaneous N/A N/A Level: Skin/Subcutaneous N/A N/A Tissue Debridement Area (sq 23 N/A N/A cm): Instrument: Forceps, Scissors N/A N/A Bleeding: Moderate N/A N/A Hemostasis Achieved: Pressure N/A N/A Procedural Pain: 3 N/A N/A Post Procedural Pain: 3 N/A N/A Debridement Treatment Procedure was tolerated N/A N/A Response: well Post Debridement 4.6x6x0.1 N/A N/A Measurements L x W x D (cm) Post Debridement 2.168 N/A N/A Volume: (cm)  Periwound Skin Texture: No Abnormalities Noted N/A N/A Periwound Skin No Abnormalities Noted N/A N/A Moisture: Periwound Skin Color: No Abnormalities Noted N/A N/A Tenderness on Yes N/A N/A Palpation: Wound Preparation: Ulcer Cleansing: N/A N/A Rinsed/Irrigated with Saline Topical Anesthetic Applied: Other: lidociane 4% Procedures Performed: Debridement N/A N/A Treatment Notes Wound #1 (Left, Lateral Upper Arm) 1. Cleansed with: Clean wound with Normal Saline 2. Anesthetic Topical Lidocaine 4% cream to wound bed prior to debridement Clavin, Monzerrath [name]. ([id number]) 4. Dressing Applied: Mepitel 5. Secondary Dressing Applied [place] Notes Steri-strips applied to secure, mepitel, kerlix and coban lightly to secure. Electronic Signature(s) Signed: 12/19/2016 10:28:07 AM By: Elliot Gurney RN, BSN, Kim RN, BSN Previous Signature: 12/18/2016 1:52:08 PM Version By: Evlyn Kanner MD, FACS Entered By: Elliot Gurney RN, BSN, Kim on 12/19/2016 07:57:34 Conradt, Larinda Buttery ([id number]) -------------------------------------------------------------------------------- Multi-Disciplinary Care Plan Details Patient Name: Truby, Laconya [name]. Date of  Service: 12/18/2016 12:45 PM Medical Record Number: [id number] Patient Account Number: [id number] Date of Birth/Sex: [birth year] (83 y.o. Female) Treating RN: Huel Coventry Primary Care Zyrah Wiswell: Pearson Grippe Other Clinician: Referring Ryelan Kazee: Ileana Roup Treating Jereline Ticer/Extender: Rudene Re in Treatment: 0 Active Inactive ` Abuse / Safety / Falls / Self Care Management Nursing Diagnoses: History of Falls Impaired physical mobility Goals: Patient will not experience any injury related to falls Date Initiated: 12/18/2016 Target Resolution Date: 01/15/2017 Goal Status: Active Interventions: Podiatry chair, stretcher in low position and side rails up as needed Provide education on safe transfers Notes: ` Orientation to the Wound Care Program Nursing Diagnoses: Knowledge deficit related to the wound healing center program Goals: Patient/caregiver will verbalize understanding of the Wound Healing Center Program Date Initiated: 12/18/2016 Target Resolution Date: 01/15/2017 Goal Status: Active Interventions: Provide education on orientation to the wound center Notes: ` Wound/Skin Impairment Nursing Diagnoses: CORLEEN, OTWELL ([id number]) Impaired tissue integrity Goals: Ulcer/skin breakdown will heal within 14 weeks Date Initiated: 12/18/2016 Target Resolution Date: 01/15/2017 Goal Status: Active Interventions: Assess patient/caregiver ability to obtain necessary supplies Treatment Activities: Skin care regimen initiated : 12/18/2016 Notes: Electronic Signature(s) Signed: 12/19/2016 10:28:07 AM By: Elliot Gurney, RN, BSN, Kim RN, BSN Previous Signature: 12/18/2016 5:54:52 PM Version By: Elliot Gurney, RN, BSN, Kim RN, BSN Entered By: Elliot Gurney, RN, BSN, Kim on 12/19/2016 07:56:48 Altergott, Brinae BMarland Kitchen ([id number]) -------------------------------------------------------------------------------- Pain Assessment Details Patient Name: Rueckert, Jodelle [name]. Date of Service: 12/18/2016 12:45 PM Medical  Record Number: [id number] Patient Account Number: [id number] Date of Birth/Sex: [birth year] (83 y.o. Female) Treating RN: Huel Coventry Primary Care Alycia Cooperwood: Pearson Grippe Other Clinician: Referring Gregrey Bloyd: Ileana Roup Treating Cyera Balboni/Extender: Rudene Re in Treatment: 0 Active Problems Location of Pain Severity and Description of Pain Patient Has Paino No Site Locations With Dressing Change: No Pain Management and Medication Current Pain Management: Electronic Signature(s) Signed: 12/18/2016 5:54:52 PM By: Elliot Gurney, RN, BSN, Kim RN, BSN Entered By: Elliot Gurney, RN, BSN, Kim on 12/18/2016 14:04:41 Schwabe, Larinda Buttery ([id number]) -------------------------------------------------------------------------------- Patient/Caregiver Education Details Patient Name: [name] [name]. Date of Service: 12/18/2016 12:45 PM Medical Record Number: [id number] Patient Account Number: [id number] Date of Birth/Gender: [birth year] (83 y.o. Female) Treating RN: Huel Coventry Primary Care Physician: Pearson Grippe Other Clinician: Referring Physician: Ileana Roup Treating Physician/Extender: Rudene Re in Treatment: 0 Education Assessment Education Provided To: Patient Education Topics Provided Wound/Skin Impairment: Handouts: Caring for Your Ulcer, Other: leave dressing in place until next visit Methods: Demonstration, Explain/Verbal Responses: State content correctly Electronic Signature(s) Signed: 12/19/2016 10:28:07 AM By: Elliot Gurney RN, BSN, Kim RN, BSN Previous Signature: 12/18/2016 5:54:52 PM Version  By: Elliot Gurney, RN, BSN, Kim RN, BSN Entered By: Elliot Gurney, RN, BSN, Kim on 12/19/2016 08:01:55 Hatchel, Larinda Buttery ([id number]) -------------------------------------------------------------------------------- Wound Assessment Details Patient Name: Haisley, Colette [name]. Date of Service: 12/18/2016 12:45 PM Medical Record Number: [id number] Patient Account Number: [id number] Date of Birth/Sex: 1923/05/08 (83 y.o.  Female) Treating RN: Huel Coventry Primary Care Daren Yeagle: Pearson Grippe Other Clinician: Referring Delio Slates: Ileana Roup Treating Vernelle Wisner/Extender: Altamese Ellington in Treatment: 0 Wound Status Wound Number: 1 Primary Etiology: Skin Tear Wound Location: Left Upper Arm - Lateral Wound Status: Open Wounding Event: Trauma Date Acquired: 12/13/2016 Weeks Of Treatment: 0 Clustered Wound: No Photos Wound Measurements Length: (cm) 4.6 Width: (cm) 6 Depth: (cm) 0.1 Area: (cm) 21.677 Volume: (cm) 2.168 % Reduction in Area: 0% % Reduction in Volume: 0% Epithelialization: Medium (34-66%) Tunneling: No Undermining: No Wound Description Classification: Partial Thickness Wound Margin: Flat and Intact Exudate Amount: Small Exudate Type: Serous Exudate Color: amber Foul Odor After Cleansing: No Slough/Fibrino No Wound Bed Granulation Amount: Large (67-100%) Exposed Structure Necrotic Amount: None Present (0%) Fascia Exposed: No Fat Layer (Subcutaneous Tissue) Exposed: No Tendon Exposed: No Muscle Exposed: No Joint Exposed: No Bone Exposed: No Detwiler, Shereen [name]. ([id number]) Limited to Skin Breakdown Periwound Skin Texture Texture Color No Abnormalities Noted: No No Abnormalities Noted: No Moisture Temperature / Pain No Abnormalities Noted: No Tenderness on Palpation: Yes Wound Preparation Ulcer Cleansing: Rinsed/Irrigated with Saline Topical Anesthetic Applied: Other: lidociane 4%, Treatment Notes Wound #1 (Left, Lateral Upper Arm) 1. Cleansed with: Clean wound with Normal Saline 2. Anesthetic Topical Lidocaine 4% cream to wound bed prior to debridement 4. Dressing Applied: Mepitel 5. Secondary Dressing Applied [place] Notes Steri-strips applied to secure, mepitel, kerlix and coban lightly to secure. Electronic Signature(s) Signed: 12/18/2016 5:54:52 PM By: Elliot Gurney, RN, BSN, Kim RN, BSN Entered By: Elliot Gurney, RN, BSN, Kim on 12/18/2016 13:10:52 Vanpelt,  Larinda Buttery ([id number]) -------------------------------------------------------------------------------- Vitals Details Patient Name: Boggio, Natalia [name]. Date of Service: 12/18/2016 12:45 PM Medical Record Number: [id number] Patient Account Number: [id number] Date of Birth/Sex: 12-Dec-1922 (83 y.o. Female) Treating RN: Huel Coventry Primary Care Sloan Takagi: Pearson Grippe Other Clinician: Referring Dorleen Kissel: Ileana Roup Treating Lathyn Griggs/Extender: Rudene Re in Treatment: 0 Vital Signs Time Taken: 13:00 Temperature (F): 97.4 Height (in): 60 Pulse (bpm): 67 Respiratory Rate (breaths/min): 16 Blood Pressure (mmHg): 104/85 Reference Range: 80 - 120 mg / dl Electronic Signature(s) Signed: 12/18/2016 5:54:52 PM By: Elliot Gurney, RN, BSN, Kim RN, BSN Entered By: Elliot Gurney, RN, BSN, Kim on 12/18/2016 14:04:47

## 2016-12-20 NOTE — Progress Notes (Signed)
Lindsay HammingSIMPSON, [name] ([id number]) Visit Report for 12/18/2016 Abuse/Suicide Risk Screen Details Patient Name: [name], [name] Date of Service: 12/18/2016 12:45 PM Medical Record Number: [id number] Patient Account Number: [id number] Date of Birth/Sex: 05/19/1923 (83 y.o. Female) Treating RN: Huel CoventryWoody, [name] Primary Care [name]: [name], Lindsay Other Clinician: Referring [name]: Lindsay RoupMCSHANE, Lindsay Treating Vercie Pokorny/Extender: Rudene ReBritto, Errol Weeks in Treatment: 0 Abuse/Suicide Risk Screen Items Answer ABUSE/SUICIDE RISK SCREEN: Has anyone close to you tried to hurt or harm you recentlyo No Do you feel uncomfortable with anyone in your familyo No Has anyone forced you do things that you didnot want to doo No Do you have any thoughts of harming yourselfo No Patient displays signs or symptoms of abuse and/or neglect. No Electronic Signature(s) Signed: 12/18/2016 5:54:52 PM By: Elliot GurneyWoody, RN, BSN, Kim RN, BSN Entered By: Elliot GurneyWoody, RN, BSN, [name] on 12/18/2016 14:07:12 [name], [name] ([id number]) -------------------------------------------------------------------------------- Activities of Daily Living Details Patient Name: [name], [name] Date of Service: 12/18/2016 12:45 PM Medical Record Number: [id number] Patient Account Number: [id number] Date of Birth/Sex: 11/06/1922 (83 y.o. Female) Treating RN: Huel CoventryWoody, [name] Primary Care Brynnlie Unterreiner: [name], Lindsay Other Clinician: Referring Laurali Goddard: Lindsay RoupMCSHANE, Lindsay Treating Chandan Fly/Extender: Rudene ReBritto, Errol Weeks in Treatment: 0 Activities of Daily Living Items Answer Activities of Daily Living (Please select one for each item) Drive Automobile Not Able Take Medications Need Assistance Use Telephone Not Able Care for Appearance Not Able Use Toilet Need Assistance Bath / Shower Need Assistance Dress Self Need Assistance Feed Self Need Assistance Walk Need Assistance Get In / Out Bed Need Assistance Housework Need Assistance Prepare Meals Need Assistance Handle Money Need  Assistance Shop for Self Need Assistance Electronic Signature(s) Signed: 12/19/2016 10:28:07 AM By: Elliot GurneyWoody, RN, BSN, Kim RN, BSN Previous Signature: 12/18/2016 5:54:52 PM Version By: Elliot GurneyWoody, RN, BSN, Kim RN, BSN Entered By: Elliot GurneyWoody, RN, BSN, [name] on 12/19/2016 07:55:41 [name], [name] ([id number]) -------------------------------------------------------------------------------- Education Assessment Details Patient Name: [name], Lindsay KannerALLIE B. Date of Service: 12/18/2016 12:45 PM Medical Record Number: [id number] Patient Account Number: [id number] Date of Birth/Sex: 12/05/1922 (83 y.o. Female) Treating RN: Huel CoventryWoody, [name] Primary Care Sonia Stickels: [name], Lindsay Other Clinician: Referring Norwin Aleman: Lindsay RoupMCSHANE, Lindsay Treating Charlyn Vialpando/Extender: Rudene ReBritto, Errol Weeks in Treatment: 0 Primary Learner Assessed: Caregiver Corrie DandyMary, caregiver from carehome Reason Patient is not Primary Learner: dementia Learning Preferences/Education Level/Primary Language Learning Preference: Explanation, Demonstration Highest Education Level: College or Above Preferred Language: English Cognitive Barrier Assessment/Beliefs Language Barrier: No Translator Needed: No Memory Deficit: No Cultural/Religious Beliefs Affecting Medical No Care: Physical Barrier Assessment Impaired Vision: No Impaired Hearing: No Decreased Hand dexterity: No Knowledge/Comprehension Assessment Knowledge Level: Low Comprehension Level: Low Ability to understand written Low instructions: Ability to understand verbal Low instructions: Motivation Assessment Anxiety Level: Calm Cooperation: Cooperative Education Importance: Denies Need Interest in Health Problems: Asks Questions Perception: Confused Willingness to Engage in Self- Low Management Activities: Readiness to Engage in Self- Low Management Activities: Electronic Signature(s) Lindsay HammingSIMPSON, [name] ([id number]) Signed: 12/19/2016 10:28:07 AM By: Elliot GurneyWoody, RN, BSN, Kim RN, BSN Previous Signature:  12/18/2016 5:54:52 PM Version By: Elliot GurneyWoody, RN, BSN, Kim RN, BSN Entered By: Elliot GurneyWoody, RN, BSN, [name] on 12/19/2016 07:55:50 [name], [name] ([id number]) -------------------------------------------------------------------------------- Fall Risk Assessment Details Patient Name: [name], [name] Date of Service: 12/18/2016 12:45 PM Medical Record Number: [id number] Patient Account Number: [id number] Date of Birth/Sex: 12/05/1922 (83 y.o. Female) Treating RN: Huel CoventryWoody, [name] Primary Care Camyla Camposano: [name], Lindsay Other Clinician: Referring Karrah Mangini: Lindsay RoupMCSHANE, Lindsay Treating Jhalil Silvera/Extender: Rudene ReBritto, Errol Weeks in Treatment: 0 Fall Risk Assessment Items Have you had 2 or more  falls in the last 12 monthso 0 Yes Have you had any fall that resulted in injury in the last 12 monthso 0 Yes FALL RISK ASSESSMENT: History of falling - immediate or within 3 months 0 No Secondary diagnosis 0 No Ambulatory aid None/bed rest/wheelchair/nurse 0 Yes Crutches/cane/walker 0 No Furniture 0 No IV Access/Saline Lock 0 No Gait/Training Normal/bed rest/immobile 0 Yes Weak 0 No Impaired 0 No Mental Status Oriented to own ability 0 No Electronic Signature(s) Signed: 12/19/2016 10:28:07 AM By: Elliot Gurney, RN, BSN, Kim RN, BSN Previous Signature: 12/18/2016 5:54:52 PM Version By: Elliot Gurney, RN, BSN, Kim RN, BSN Entered By: Elliot Gurney, RN, BSN, [name] on 12/19/2016 07:56:01 [name], [name] ([id number]) -------------------------------------------------------------------------------- Nutrition Risk Assessment Details Patient Name: [name], [name] Date of Service: 12/18/2016 12:45 PM Medical Record Number: [id number] Patient Account Number: [id number] Date of Birth/Sex: 22-Oct-1922 (83 y.o. Female) Treating RN: Huel Coventry Primary Care Joaquina Nissen: Pearson Grippe Other Clinician: Referring Parry Po: [name] Treating Kyonna Frier/Extender: Rudene Re in Treatment: 0 Height (in): 60 Weight (lbs): Body Mass Index (BMI): Nutrition Risk  Assessment Items NUTRITION RISK SCREEN: I have an illness or condition that made me change the kind and/or 0 No amount of food I eat I eat fewer than two meals per day 0 No I eat few fruits and vegetables, or milk products 0 No I have three or more drinks of beer, liquor or wine almost every day 0 No I have tooth or mouth problems that make it hard for me to eat 0 No I don't always have enough money to buy the food I need 0 No I eat alone most of the time 0 No I take three or more different prescribed or over-the-counter drugs a 0 No day Without wanting to, I have lost or gained 10 pounds in the last six 0 No months I am not always physically able to shop, cook and/or feed myself 0 No Nutrition Protocols Good Risk Protocol Provide education on Moderate Risk Protocol 0 nutrition Electronic Signature(s) Signed: 12/19/2016 10:28:07 AM By: Elliot Gurney, RN, BSN, Kim RN, BSN Previous Signature: 12/18/2016 5:54:52 PM Version By: Elliot Gurney, RN, BSN, Kim RN, BSN Entered By: Elliot Gurney, RN, BSN, [name] on 12/19/2016 07:56:17

## 2016-12-20 NOTE — Progress Notes (Signed)
ASHEA, WINIARSKI ([id number]) Visit Report for 12/18/2016 Chief Complaint Document Details Patient Name: [name], [name] Date of Service: 12/18/2016 12:45 PM Medical Record Number: [id number] Patient Account Number: [id number] Date of Birth/Sex: 1923-06-09 (83 y.o. Female) Treating RN: Huel Coventry Primary Care Provider: Pearson Grippe Other Clinician: Referring Provider: Ileana Roup Treating Provider/Extender: Rudene Re in Treatment: 0 Information Obtained from: Patient Chief Complaint Patient presents to the wound care center for a consult due non healing wound to the left upper arm for about a week Electronic Signature(s) Signed: 12/19/2016 8:27:46 AM By: Evlyn Kanner MD, FACS Signed: 12/19/2016 10:28:07 AM By: Elliot Gurney RN, BSN, Kim RN, BSN Previous Signature: 12/18/2016 1:56:14 PM Version By: Evlyn Kanner MD, FACS Entered By: Elliot Gurney RN, BSN, Kim on 12/19/2016 07:59:43 [place], Larinda Buttery ([id number]) -------------------------------------------------------------------------------- Debridement Details Patient Name: Tuckerman, Seairra B. Date of Service: 12/18/2016 12:45 PM Medical Record Number: [id number] Patient Account Number: [id number] Date of Birth/Sex: 03/16/1923 (83 y.o. Female) Treating RN: Huel Coventry Primary Care Provider: Pearson Grippe Other Clinician: Referring Provider: Ileana Roup Treating Provider/Extender: Rudene Re in Treatment: 0 Debridement Performed for Wound #1 Left,Lateral Upper Arm Assessment: Performed By: Physician Evlyn Kanner, MD Debridement: Debridement Pre-procedure Yes - 13:30 Verification/Time Out Taken: Start Time: 13:31 Pain Control: Other : lidocaine 4% Level: Skin/Subcutaneous Tissue Total Area Debrided (L x 4.6 (cm) x 5 (cm) = 23 (cm) W): Tissue and other Viable, Non-Viable, Skin, Subcutaneous material debrided: Instrument: Forceps, Scissors Bleeding: Moderate Hemostasis Achieved: Pressure End Time: 13:35 Procedural Pain:  3 Post Procedural Pain: 3 Response to Treatment: Procedure was tolerated well Post Debridement Measurements of Total Wound Length: (cm) 4.6 Width: (cm) 6 Depth: (cm) 0.1 Volume: (cm) 2.168 Character of Wound/Ulcer Post Requires Further Debridement Debridement: Severity of Tissue Post Debridement: Fat layer exposed Post Procedure Diagnosis Same as Pre-procedure Notes a large skin tear which had bunched up superiorly on the left arm was meticulously dissected, irrigated profusely with saline and the viable part of the skin flap was brought down onto the wound bed which was irrigated profusely with saline. Once the wound bed was prepared the rest of the area was appropriately Kreager, Denisia B. ([id number]) debrided and skin prep with Steri-Strips were applied. We then used Mepitel, Kerlix and Coban dressing and held this in place. Electronic Signature(s) Signed: 12/19/2016 8:27:46 AM By: Evlyn Kanner MD, FACS Signed: 12/19/2016 10:28:07 AM By: Elliot Gurney RN, BSN, Kim RN, BSN Previous Signature: 12/18/2016 1:55:49 PM Version By: Evlyn Kanner MD, FACS Previous Signature: 12/18/2016 5:54:52 PM Version By: Elliot Gurney RN, BSN, Kim RN, BSN Entered By: Elliot Gurney, RN, BSN, Kim on 12/19/2016 07:57:45 Kulak, Larinda Buttery ([id number]) -------------------------------------------------------------------------------- HPI Details Patient Name: Lindseth, Akiko B. Date of Service: 12/18/2016 12:45 PM Medical Record Number: [id number] Patient Account Number: [id number] Date of Birth/Sex: June 18, 1923 (83 y.o. Female) Treating RN: Huel Coventry Primary Care Provider: Pearson Grippe Other Clinician: Referring Provider: Ileana Roup Treating Provider/Extender: Rudene Re in Treatment: 0 History of Present Illness Location: left upper arm Quality: Patient reports experiencing a sharp pain to affected area(s). Severity: Patient states wound are getting worse. Duration: Patient has had the wound for < 1 week prior to  presenting for treatment Timing: Pain in wound is Intermittent (comes and goes Context: The wound occurred when the patient had a fall and injured her left upper arm Modifying Factors: Other treatment(s) tried include:went to the ER with Neosporin was applied and a dressing placed Associated Signs and Symptoms: Patient reports having increase discharge. HPI Description: 83 year old patient  who was recently seen in the ER for a left arm abrasion. The patient has dementia and as per the caregivers she sustained a skin tear on her left arm. past medical history significant for arthritis, cough and chronic bronchitis, COPD, hypertension,status post appendectomy, hip fracture surgery, protein calorie malnutrition. the skin tear was dressed with Neosporin and a non-adherent dressing was applied to the arm and wrapped with gauze and was told to see the wound care service Electronic Signature(s) Signed: 12/19/2016 8:27:46 AM By: Evlyn Kanner MD, FACS Signed: 12/19/2016 10:28:07 AM By: Elliot Gurney RN, BSN, Kim RN, BSN Previous Signature: 12/18/2016 1:57:05 PM Version By: Evlyn Kanner MD, FACS Previous Signature: 12/18/2016 1:15:54 PM Version By: Evlyn Kanner MD, FACS Entered By: Elliot Gurney RN, BSN, Kim on 12/19/2016 07:59:53 Cruzan, Larinda Buttery ([id number]) -------------------------------------------------------------------------------- Physical Exam Details Patient Name: Square, Montrice B. Date of Service: 12/18/2016 12:45 PM Medical Record Number: [id number] Patient Account Number: [id number] Date of Birth/Sex: 1923/05/07 (83 y.o. Female) Treating RN: Huel Coventry Primary Care Provider: Pearson Grippe Other Clinician: Referring Provider: Ileana Roup Treating Provider/Extender: Rudene Re in Treatment: 0 Constitutional . Pulse regular. Respirations normal and unlabored. Afebrile. . Eyes Nonicteric. Reactive to light. Ears, Nose, Mouth, and Throat Lips, teeth, and gums WNL.Marland Kitchen Moist mucosa without  lesions. Neck supple and nontender. No palpable supraclavicular or cervical adenopathy. Normal sized without goiter. Respiratory WNL. No retractions.. Breath sounds WNL, No rubs, rales, rhonchi, or wheeze.. Cardiovascular Heart rhythm and rate regular, no murmur or gallop.. Pedal Pulses WNL. No clubbing, cyanosis or edema. Gastrointestinal (GI) Abdomen without masses or tenderness.. No liver or spleen enlargement or tenderness.. Lymphatic No adneopathy. No adenopathy. No adenopathy. Musculoskeletal Adexa without tenderness or enlargement.. Digits and nails w/o clubbing, cyanosis, infection, petechiae, ischemia, or inflammatory conditions.. Integumentary (Hair, Skin) No suspicious lesions. No crepitus or fluctuance. No peri-wound warmth or erythema. No masses.Marland Kitchen Psychiatric Judgement and insight Intact.. No evidence of depression, anxiety, or agitation.. Notes the left upper arm at a lacerated wound with the skin bunched up towards the superior end and a large area of subcutaneous tissue was exposed with necrotic debris. with a forceps and scissors the entire skin flap was separated, irrigated thoroughly and the skin flap with a superior-based pedicle was brought down inferiorly and Steri-Stripped in place after appropriate prep. Electronic Signature(s) Signed: 12/19/2016 8:27:46 AM By: Evlyn Kanner MD, FACS Signed: 12/19/2016 10:28:07 AM By: Elliot Gurney RN, BSN, Kim RN, BSN Previous Signature: 12/18/2016 1:58:14 PM Version By: Evlyn Kanner MD, FACS Entered By: Elliot Gurney RN, BSN, Kim on 12/19/2016 08:00:02 Roughton, Larinda Buttery ([id number]) Eberlein, Larinda Buttery ([id number]) -------------------------------------------------------------------------------- Physician Orders Details Patient Name: Lauture, Shada B. Date of Service: 12/18/2016 12:45 PM Medical Record Number: [id number] Patient Account Number: [id number] Date of Birth/Sex: Feb 28, 1923 (83 y.o. Female) Treating RN: Huel Coventry Primary Care  Provider: Pearson Grippe Other Clinician: Referring Provider: Ileana Roup Treating Provider/Extender: Rudene Re in Treatment: 0 Verbal / Phone Orders: No Diagnosis Coding Wound Cleansing Wound #1 Left,Lateral Upper Arm o Clean wound with Normal Saline. Anesthetic Wound #1 Left,Lateral Upper Arm o Topical Lidocaine 4% cream applied to wound bed prior to debridement Primary Wound Dressing Wound #1 Left,Lateral Upper Arm o Mepitel One Contact layer Secondary Dressing Wound #1 Left,Lateral Upper Arm o Kerlix and Coban o Non-adherent pad Dressing Change Frequency Wound #1 Left,Lateral Upper Arm o Change dressing every week Follow-up Appointments Wound #1 Left,Lateral Upper Arm o Return Appointment in 1 week. Electronic Signature(s) Signed: 12/19/2016 8:27:46 AM By: Evlyn Kanner MD, FACS  Signed: 12/19/2016 10:28:07 AM By: Elliot Gurney RN, BSN, Kim RN, BSN Previous Signature: 12/18/2016 5:54:52 PM Version By: Elliot Gurney RN, BSN, Kim RN, BSN Entered By: Elliot Gurney, RN, BSN, Kim on 12/19/2016 07:58:55 Gueye, Larinda Buttery ([id number]) -------------------------------------------------------------------------------- Problem List Details Patient Name: Turano, Krishika B. Date of Service: 12/18/2016 12:45 PM Medical Record Number: [id number] Patient Account Number: [id number] Date of Birth/Sex: 11-May-1923 (83 y.o. Female) Treating RN: Huel Coventry Primary Care Provider: Pearson Grippe Other Clinician: Referring Provider: Ileana Roup Treating Provider/Extender: Rudene Re in Treatment: 0 Active Problems ICD-10 Encounter Code Description Active Date Diagnosis S41.112A Laceration without foreign body of left upper arm, initial 12/18/2016 Yes encounter S41.102A Unspecified open wound of left upper arm, initial 12/18/2016 Yes encounter E44.0 Moderate protein-calorie malnutrition 12/18/2016 Yes Inactive Problems Resolved Problems Electronic Signature(s) Signed: 12/19/2016 8:27:46 AM By:  Evlyn Kanner MD, FACS Signed: 12/19/2016 10:28:07 AM By: Elliot Gurney RN, BSN, Kim RN, BSN Previous Signature: 12/18/2016 1:51:58 PM Version By: Evlyn Kanner MD, FACS Entered By: Elliot Gurney, RN, BSN, Kim on 12/19/2016 07:59:26 Flinchum, Larinda Buttery ([id number]) -------------------------------------------------------------------------------- Progress Note Details Patient Name: Tirrell, Sherl B. Date of Service: 12/18/2016 12:45 PM Medical Record Number: [id number] Patient Account Number: [id number] Date of Birth/Sex: 1922/09/02 (83 y.o. Female) Treating RN: Huel Coventry Primary Care Provider: Pearson Grippe Other Clinician: Referring Provider: Ileana Roup Treating Provider/Extender: Rudene Re in Treatment: 0 Subjective Chief Complaint Information obtained from Patient Patient presents to the wound care center for a consult due non healing wound to the left upper arm for about a week History of Present Illness (HPI) The following HPI elements were documented for the patient's wound: Location: left upper arm Quality: Patient reports experiencing a sharp pain to affected area(s). Severity: Patient states wound are getting worse. Duration: Patient has had the wound for < 1 week prior to presenting for treatment Timing: Pain in wound is Intermittent (comes and goes Context: The wound occurred when the patient had a fall and injured her left upper arm Modifying Factors: Other treatment(s) tried include:went to the ER with Neosporin was applied and a dressing placed Associated Signs and Symptoms: Patient reports having increase discharge. 83 year old patient who was recently seen in the ER for a left arm abrasion. The patient has dementia and as per the caregivers she sustained a skin tear on her left arm. past medical history significant for arthritis, cough and chronic bronchitis, COPD, hypertension,status post appendectomy, hip fracture surgery, protein calorie malnutrition. the skin tear was  dressed with Neosporin and a non-adherent dressing was applied to the arm and wrapped with gauze and was told to see the wound care service Wound History Patient presents with 1 open wound that has been present for approximately 1 week. Patient has been treating wound in the following manner: neosporin. Laboratory tests have not been performed in the last month. Patient reportedly has not tested positive for an antibiotic resistant organism. Patient History Information obtained from Patient, Caregiver, Chart. Allergies No Known Drug Allergies Social History NONIE, LOCHNER B. ([id number]) Never smoker, Alcohol Use - Never, Caffeine Use - Daily. Medical History Respiratory Patient has history of Chronic Obstructive Pulmonary Disease (COPD) Cardiovascular Patient has history of Hypertension Endocrine Denies history of Type I Diabetes, Type II Diabetes Immunological Denies history of Lupus Erythematosus, Raynaud s, Scleroderma Musculoskeletal Patient has history of Gout Neurologic Patient has history of Dementia Psychiatric Denies history of Anorexia/bulimia, Confinement Anxiety Medical And Surgical History Notes Constitutional Symptoms (General Health) COPD, HTN, GERD, pulmonary fibrosis and Gout Review of Systems (ROS)  Constitutional Symptoms (General Health) Complains or has symptoms of Marked Weight Change. Eyes The patient has no complaints or symptoms. Ear/Nose/Mouth/Throat The patient has no complaints or symptoms. Hematologic/Lymphatic The patient has no complaints or symptoms. Cardiovascular The patient has no complaints or symptoms. Gastrointestinal The patient has no complaints or symptoms. Endocrine The patient has no complaints or symptoms. Genitourinary The patient has no complaints or symptoms. Immunological The patient has no complaints or symptoms. Integumentary (Skin) Complains or has symptoms of Wounds. Denies complaints or symptoms of Bleeding or  bruising tendency, Breakdown, Swelling. Musculoskeletal The patient has no complaints or symptoms. Psychiatric Denies complaints or symptoms of Anxiety, Claustrophobia. General Notes: Patient is a poor historian rt dementia. Caregiver from ALF has limited knowedge of patient. Spear, Jossette B. ([id number]) Objective Constitutional Pulse regular. Respirations normal and unlabored. Afebrile. Vitals Time Taken: 1:00 PM, Height: 60 in, Temperature: 97.4 F, Pulse: 67 bpm, Respiratory Rate: 16 breaths/min, Blood Pressure: 104/85 mmHg. Eyes Nonicteric. Reactive to light. Ears, Nose, Mouth, and Throat Lips, teeth, and gums WNL.Marland Kitchen. Moist mucosa without lesions. Neck supple and nontender. No palpable supraclavicular or cervical adenopathy. Normal sized without goiter. Respiratory WNL. No retractions.. Breath sounds WNL, No rubs, rales, rhonchi, or wheeze.. Cardiovascular Heart rhythm and rate regular, no murmur or gallop.. Pedal Pulses WNL. No clubbing, cyanosis or edema. Gastrointestinal (GI) Abdomen without masses or tenderness.. No liver or spleen enlargement or tenderness.. Lymphatic No adneopathy. No adenopathy. No adenopathy. Musculoskeletal Adexa without tenderness or enlargement.. Digits and nails w/o clubbing, cyanosis, infection, petechiae, ischemia, or inflammatory conditions.Marland Kitchen. Psychiatric Judgement and insight Intact.. No evidence of depression, anxiety, or agitation.. General Notes: the left upper arm at a lacerated wound with the skin bunched up towards the superior end and a large area of subcutaneous tissue was exposed with necrotic debris. with a forceps and scissors the entire skin flap was separated, irrigated thoroughly and the skin flap with a superior-based pedicle was brought down inferiorly and Steri-Stripped in place after appropriate prep. Integumentary (Hair, Skin) No suspicious lesions. No crepitus or fluctuance. No peri-wound warmth or erythema. No  masses.. Wound #1 status is Open. Original cause of wound was Trauma. The wound is located on the Left,Lateral Panjwani, Thelma B. ([id number]) Upper Arm. The wound measures 4.6cm length x 6cm width x 0.1cm depth; 21.677cm^2 area and 2.168cm^3 volume. The wound is limited to skin breakdown. There is no tunneling or undermining noted. There is a small amount of serous drainage noted. The wound margin is flat and intact. There is large (67- 100%) granulation within the wound bed. There is no necrotic tissue within the wound bed. The periwound has tenderness on palpation. Assessment Active Problems ICD-10 S41.112A - Laceration without foreign body of left upper arm, initial encounter S41.102A - Unspecified open wound of left upper arm, initial encounter E44.0 - Moderate protein-calorie malnutrition This 83 year old patient had a large laceration of the skin with a skin flap which was bunched up superiorly. After appropriate wound care, debridement and appropriate dressing we have bolstered this in place and left her dressing intact. Caregiver will make sure she leaves the dressing in place does not weighted and return to see as in a week's time for evaluation. Procedures Wound #1 Wound #1 is a Skin Tear located on the Left,Lateral Upper Arm . There was a Skin/Subcutaneous Tissue Debridement [id number]) debridement with total area of 23 sq cm performed by Evlyn KannerBritto, Clearance Chenault, MD. with the following instrument(s): Forceps and Scissors to remove Viable and Non-Viable tissue/material including Skin  and Subcutaneous after achieving pain control using Other (lidocaine 4%). A time out was conducted at 13:30, prior to the start of the procedure. A Moderate amount of bleeding was controlled with Pressure. The procedure was tolerated well with a pain level of 3 throughout and a pain level of 3 following the procedure. Post Debridement Measurements: 4.6cm length x 6cm width x 0.1cm depth;  2.168cm^3 volume. Character of Wound/Ulcer Post Debridement requires further debridement. Severity of Tissue Post Debridement is: Fat layer exposed. Post procedure Diagnosis Wound #1: Same as Pre-Procedure General Notes: a large skin tear which had bunched up superiorly on the left arm was meticulously dissected, irrigated profusely with saline and the viable part of the skin flap was brought down onto the wound bed which was irrigated profusely with saline. Once the wound bed was prepared the rest of the area was appropriately debrided and skin prep with Steri-Strips were applied. We then used Mepitel, Kerlix Housand, Sabine B. ([id number]) and Coban dressing and held this in place.. Plan Wound Cleansing: Wound #1 Left,Lateral Upper Arm: Clean wound with Normal Saline. Anesthetic: Wound #1 Left,Lateral Upper Arm: Topical Lidocaine 4% cream applied to wound bed prior to debridement Primary Wound Dressing: Wound #1 Left,Lateral Upper Arm: Mepitel One Contact layer Secondary Dressing: Wound #1 Left,Lateral Upper Arm: Kerlix and Coban Non-adherent pad Dressing Change Frequency: Wound #1 Left,Lateral Upper Arm: Change dressing every week Follow-up Appointments: Wound #1 Left,Lateral Upper Arm: Return Appointment in 1 week. This 83 year old patient had a large laceration of the skin with a skin flap which was bunched up superiorly. After appropriate wound care, debridement and appropriate dressing we have bolstered this in place and left her dressing intact. Caregiver will make sure she leaves the dressing in place does not weighted and return to see as in a week's time for evaluation Electronic Signature(s) Signed: 12/19/2016 8:27:46 AM By: Evlyn Kanner MD, FACS Signed: 12/19/2016 10:28:07 AM By: Elliot Gurney RN, BSN, Kim RN, BSN Previous Signature: 12/18/2016 1:59:30 PM Version By: Evlyn Kanner MD, FACS Entered By: Elliot Gurney RN, BSN, Kim on 12/19/2016 08:00:25 Ebright, Larinda Buttery  ([id number]) -------------------------------------------------------------------------------- ROS/PFSH Details Patient Name: Dougan, Makiya B. Date of Service: 12/18/2016 12:45 PM Medical Record Number: [id number] Patient Account Number: [id number] Date of Birth/Sex: Aug 01, 1923 (83 y.o. Female) Treating RN: Huel Coventry Primary Care Provider: Pearson Grippe Other Clinician: Referring Provider: Ileana Roup Treating Provider/Extender: Rudene Re in Treatment: 0 Information Obtained From Patient Caregiver Chart Wound History Do you currently have one or more open woundso Yes How many open wounds do you currently haveo 1 Approximately how long have you had your woundso 1 week How have you been treating your wound(s) until nowo neosporin Has your wound(s) ever healed and then re-openedo No Have you had any lab work done in the past montho No Have you tested positive for an antibiotic resistant organism (MRSA, VRE)o No Constitutional Symptoms (General Health) Complaints and Symptoms: Positive for: Marked Weight Change Medical History: Past Medical History Notes: COPD, HTN, GERD, pulmonary fibrosis and Gout Integumentary (Skin) Complaints and Symptoms: Positive for: Wounds Negative for: Bleeding or bruising tendency; Breakdown; Swelling Psychiatric Complaints and Symptoms: Negative for: Anxiety; Claustrophobia Medical History: Negative for: Anorexia/bulimia; Confinement Anxiety Eyes Complaints and Symptoms: No Complaints or Symptoms Ear/Nose/Mouth/Throat Nettle, Batsheva B. ([id number]) Complaints and Symptoms: No Complaints or Symptoms Hematologic/Lymphatic Complaints and Symptoms: No Complaints or Symptoms Respiratory Medical History: Positive for: Chronic Obstructive Pulmonary Disease (COPD) Cardiovascular Complaints and Symptoms: No Complaints or Symptoms Medical History: Positive for: Hypertension  Gastrointestinal Complaints and Symptoms: No Complaints or  Symptoms Endocrine Complaints and Symptoms: No Complaints or Symptoms Medical History: Negative for: Type I Diabetes; Type II Diabetes Genitourinary Complaints and Symptoms: No Complaints or Symptoms Immunological Complaints and Symptoms: No Complaints or Symptoms Medical History: Negative for: Lupus Erythematosus; Raynaudos; Scleroderma Musculoskeletal Complaints and Symptoms: No Complaints or Symptoms Medical History: BRYTNEE, BECHLER ([id number]) Positive for: Gout Neurologic Medical History: Positive for: Dementia Immunizations Pneumococcal Vaccine: Received Pneumococcal Vaccination: No Family and Social History Never smoker; Alcohol Use: Never; Caffeine Use: Daily; Living Will: Yes (Not Provided); Medical Power of Attorney: Yes (Not Provided) Physician Affirmation I have reviewed and agree with the above information. Notes Patient is a poor historian rt dementia. Caregiver from ALF has limited knowedge of patient. Electronic Signature(s) Signed: 12/19/2016 8:27:46 AM By: Evlyn Kanner MD, FACS Signed: 12/19/2016 10:28:07 AM By: Elliot Gurney RN, BSN, Kim RN, BSN Previous Signature: 12/18/2016 3:54:55 PM Version By: Evlyn Kanner MD, FACS Previous Signature: 12/18/2016 5:54:52 PM Version By: Elliot Gurney RN, BSN, Kim RN, BSN Entered By: Elliot Gurney, RN, BSN, Kim on 12/19/2016 07:55:14 Giovanelli, Larinda Buttery ([id number]) -------------------------------------------------------------------------------- SuperBill Details Patient Name: Berkel, Kerrianne B. Date of Service: 12/18/2016 Medical Record Number: [id number] Patient Account Number: [id number] Date of Birth/Sex: 06/09/1923 (83 y.o. Female) Treating RN: Huel Coventry Primary Care Provider: Pearson Grippe Other Clinician: Referring Provider: Ileana Roup Treating Provider/Extender: Rudene Re in Treatment: 0 Diagnosis Coding ICD-10 Codes Code Description 419 112 2063 Laceration without foreign body of left upper arm, initial encounter S41.102A  Unspecified open wound of left upper arm, initial encounter E44.0 Moderate protein-calorie malnutrition Facility Procedures CPT4 Code Description: 32440102 99213 - WOUND CARE VISIT-LEV 3 EST PT Modifier: Quantity: 1 CPT4 Code Description: [id number] 11042 - DEB SUBQ TISSUE 20 SQ CM/< ICD-10 Description Diagnosis S41.112A Laceration without foreign body of left upper arm, init S41.102A Unspecified open wound of left upper arm, initial encou E44.0 Moderate  protein-calorie malnutrition Modifier: ial encount nter Quantity: 1 er CPT4 Code Description: [id number] 11045 - DEB SUBQ TISS EA ADDL 20CM ICD-10 Description Diagnosis S41.112A Laceration without foreign body of left upper arm, init E44.0 Moderate protein-calorie malnutrition S41.102A Unspecified open wound of left upper  arm, initial encou Modifier: ial encount nter Quantity: 1 er Physician Procedures CPT4 Code Description: [id number] - WC PHYS LEVEL 4 - NEW PT ICD-10 Description Diagnosis S41.112A Laceration without foreign body of left upper arm, init S41.102A Unspecified open wound of left upper arm, initial encou E44.0 Moderate protein-calorie  malnutrition Modifier: ial encount nter Quantity: 1 er CPT4 Code Description: [id number] 11042 - WC PHYS SUBQ TISS 20 SQ CM [place], Noel B. ([id number]) Modifier: Quantity: 1 Electronic Signature(s) Signed: 12/19/2016 8:27:46 AM By: Evlyn Kanner MD, FACS Signed: 12/19/2016 10:28:07 AM By: Elliot Gurney RN, BSN, Kim RN, BSN Previous Signature: 12/18/2016 2:00:07 PM Version By: Evlyn Kanner MD, FACS Entered By: Elliot Gurney RN, BSN, Kim on 12/19/2016 07:59:17

## 2016-12-25 ENCOUNTER — Emergency Department: Payer: Medicare HMO

## 2016-12-25 ENCOUNTER — Encounter: Payer: Medicare HMO | Admitting: Surgery

## 2016-12-25 ENCOUNTER — Emergency Department
Admission: EM | Admit: 2016-12-25 | Discharge: 2016-12-25 | Disposition: A | Discharge status: Home / Self Care | Payer: Medicare HMO | Attending: Emergency Medicine | Admitting: Emergency Medicine

## 2016-12-25 ENCOUNTER — Encounter: Payer: Self-pay | Admitting: Emergency Medicine

## 2016-12-25 DIAGNOSIS — M109 Gout, unspecified: Secondary | ICD-10-CM | POA: Diagnosis not present

## 2016-12-25 DIAGNOSIS — Y92031 Bathroom in apartment as the place of occurrence of the external cause: Secondary | ICD-10-CM | POA: Insufficient documentation

## 2016-12-25 DIAGNOSIS — Z87891 Personal history of nicotine dependence: Secondary | ICD-10-CM | POA: Insufficient documentation

## 2016-12-25 DIAGNOSIS — S299XXA Unspecified injury of thorax, initial encounter: Secondary | ICD-10-CM | POA: Diagnosis not present

## 2016-12-25 DIAGNOSIS — Y999 Unspecified external cause status: Secondary | ICD-10-CM | POA: Diagnosis not present

## 2016-12-25 DIAGNOSIS — J449 Chronic obstructive pulmonary disease, unspecified: Secondary | ICD-10-CM | POA: Insufficient documentation

## 2016-12-25 DIAGNOSIS — S20211A Contusion of right front wall of thorax, initial encounter: Secondary | ICD-10-CM | POA: Insufficient documentation

## 2016-12-25 DIAGNOSIS — W01198A Fall on same level from slipping, tripping and stumbling with subsequent striking against other object, initial encounter: Secondary | ICD-10-CM | POA: Insufficient documentation

## 2016-12-25 DIAGNOSIS — M1990 Unspecified osteoarthritis, unspecified site: Secondary | ICD-10-CM | POA: Diagnosis not present

## 2016-12-25 DIAGNOSIS — K219 Gastro-esophageal reflux disease without esophagitis: Secondary | ICD-10-CM | POA: Diagnosis not present

## 2016-12-25 DIAGNOSIS — I10 Essential (primary) hypertension: Secondary | ICD-10-CM | POA: Insufficient documentation

## 2016-12-25 DIAGNOSIS — F0390 Unspecified dementia without behavioral disturbance: Secondary | ICD-10-CM | POA: Diagnosis not present

## 2016-12-25 DIAGNOSIS — Z79899 Other long term (current) drug therapy: Secondary | ICD-10-CM | POA: Insufficient documentation

## 2016-12-25 DIAGNOSIS — Y939 Activity, unspecified: Secondary | ICD-10-CM | POA: Diagnosis not present

## 2016-12-25 DIAGNOSIS — E440 Moderate protein-calorie malnutrition: Secondary | ICD-10-CM | POA: Diagnosis not present

## 2016-12-25 DIAGNOSIS — W19XXXA Unspecified fall, initial encounter: Secondary | ICD-10-CM

## 2016-12-25 DIAGNOSIS — S41112A Laceration without foreign body of left upper arm, initial encounter: Secondary | ICD-10-CM | POA: Diagnosis not present

## 2016-12-25 DIAGNOSIS — S41102A Unspecified open wound of left upper arm, initial encounter: Secondary | ICD-10-CM | POA: Diagnosis not present

## 2016-12-25 NOTE — ED Triage Notes (Signed)
Pt from group home, here with caregiver. Went to sit in bathroom and fell hitting ribs on side of tub. c/o pain to right mid axillary line. Pain with breath.

## 2016-12-25 NOTE — ED Notes (Signed)
Right rib pain after fall in bathroom while being assisted to wipe. Pt did not fall to ground, pt fell to side. No other injuries noted. Pt arrives with caregiver via wheelchair.

## 2016-12-25 NOTE — ED Provider Notes (Addendum)
[facility] Emergency Department Provider Note  Time seen: 5:15 PM  I have reviewed the triage vital signs and the nursing notes.   HISTORY  Chief Complaint Fall    HPI [name] is a 83 y.o. female with a past medical history of dementia, COPD, gastric reflux, hypertension, presents to the emergency department after a fall. According to the patient's caregiver patient was being helped in the bathroom however she slipped falling onto her right side. Caregiver states her right rib cage appeared to hit the bathtub. Patient states pain to this area after the fall. Caregiver denies any head injury. Denies LOC. Patient has been ambulatory since the fall. Patient has significant dementia and does not recall the fall. Denies any complaints at this time.  Past Medical History:  Diagnosis Date  . Arthritis   . Chronic bronchitis (HCC)   . COPD (chronic obstructive pulmonary disease) (HCC)   . GERD (gastroesophageal reflux disease)   . Gout   . H/O hiatal hernia   . High cholesterol   . Hypertension     Patient Active Problem List   Diagnosis Date Noted  . Failure to thrive in adult 04/15/2016  . Acute encephalopathy 04/15/2016  . GERD (gastroesophageal reflux disease) 04/15/2016  . Orthostatic hypotension 03/18/2016  . Orthostatic dizziness 03/18/2016  . Postinflammatory pulmonary fibrosis (HCC) 04/29/2015  . Dyspnea 04/26/2015  . Severe malnutrition (HCC) 02/23/2015  . AKI (acute kidney injury) (HCC) 02/22/2015  . Arthritis 02/22/2015  . Weakness   . Dehydration 02/21/2015  . ARF (acute renal failure) (HCC) 10/03/2014  . Underweight 07/20/2014  . Acute bronchitis 07/20/2014  . COPD GOLD I with reversibility  07/18/2014  . Essential hypertension 07/18/2014  . Gout 07/18/2014  . SBO (small bowel obstruction) (HCC) 01/14/2014  . Hypercalcemia 01/14/2014  . Protein-calorie malnutrition, severe (HCC) 10/10/2013  . Acute respiratory failure (HCC)  10/07/2013  . Hypokalemia 10/07/2013  . H. influenzae septicemia (HCC) 12/06/2012  . Leukocytopenia, unspecified 12/03/2012  . HTN (hypertension) 12/02/2012  . Gouty arthritis 12/02/2012    Past Surgical History:  Procedure Laterality Date  . APPENDECTOMY  1945  . CATARACT EXTRACTION W/ INTRAOCULAR LENS  IMPLANT, BILATERAL Bilateral   . FRACTURE SURGERY    . HIP FRACTURE SURGERY     "don't remember which side"  . TEE WITHOUT CARDIOVERSION N/A 10/13/2013   Procedure: TRANSESOPHAGEAL ECHOCARDIOGRAM (TEE);  Surgeon: Quintella Reichertraci R Turner, MD;  [facility];  Service: Cardiovascular;  Laterality: N/A;    Prior to Admission medications   Medication Sig Start Date End Date Taking? Authorizing Provider  acetaminophen (TYLENOL) 325 MG tablet Take 2 tablets (650 mg total) by mouth every 6 (six) hours as needed for mild pain (or Fever >/= 101). Patient not taking: Reported on 08/15/2016 03/21/16   Regalado, Jon BillingsBelkys A, MD  albuterol (PROVENTIL HFA;VENTOLIN HFA) 108 (90 Base) MCG/ACT inhaler Inhale 2 puffs into the lungs every 4 (four) hours as needed for wheezing or shortness of breath. 12/01/16   Ward, Chase PicketJaime Pilcher, PA-C  allopurinol (ZYLOPRIM) 300 MG tablet Take 300 mg by mouth daily.     [provider]  atenolol (TENORMIN) 25 MG tablet Take 25 mg by mouth at bedtime.     [provider]  atorvastatin (LIPITOR) 20 MG tablet Take 20 mg by mouth at bedtime.    [provider]  budesonide (PULMICORT) 0.25 MG/2ML nebulizer solution Take 2 mLs (0.25 mg total) by nebulization 2 (two) times daily. Patient not taking:  Reported on 08/15/2016 10/12/15   Nyoka Cowden, MD  donepezil (ARICEPT) 5 MG tablet Take 1 tablet (5 mg total) by mouth at bedtime. Patient not taking: Reported on 08/15/2016 04/16/16   Rolly Salter, MD  ipratropium-albuterol (DUONEB) 0.5-2.5 (3) MG/3ML SOLN Take 3 mLs by nebulization every 6 (six) hours as needed. 12/01/16   Ward, Chase Picket, PA-C   losartan (COZAAR) 100 MG tablet Take 100 mg by mouth daily.     [provider]  meclizine (ANTIVERT) 25 MG tablet Take 0.5 tablets (12.5 mg total) by mouth 3 (three) times daily as needed for dizziness. Patient not taking: Reported on 08/15/2016 01/05/16   Arby Barrette, MD  neomycin-bacitracin-polymyxin (NEOSPORIN) ointment Apply 1 application topically every 12 (twelve) hours. apply to eye 12/13/16   Enid Derry, PA-C  OLANZapine (ZYPREXA) 5 MG tablet Take 5 mg by mouth at bedtime. 07/11/16   [provider]  pantoprazole (PROTONIX) 40 MG tablet TAKE 1 TABLET BY MOUTH EVERY DAY 30-60 MINUTES BEFORE FIRST MEAL OF THE DAY Patient taking differently: Take 40 mg by mouth once a day 10/22/15   Nyoka Cowden, MD  polyethylene glycol (MIRALAX / GLYCOLAX) packet Take 17 g by mouth daily as needed for mild constipation. Patient not taking: Reported on 08/15/2016 04/16/16   Rolly Salter, MD  predniSONE (DELTASONE) 20 MG tablet Take 2 tablets (40 mg total) by mouth daily. Patient not taking: Reported on 08/15/2016 04/18/16   Sam, Ace Gins, PA-C  predniSONE (DELTASONE) 20 MG tablet Take 2 tablets (40 mg total) by mouth daily. Patient not taking: Reported on 08/15/2016 05/12/16   Lorre Nick, MD    Allergies  Allergen Reactions  . Penicillins Rash    Tolerated ceftriaxone Has patient had a PCN reaction causing immediate rash, facial/tongue/throat swelling, SOB or lightheadedness with hypotension: Yes Has patient had a PCN reaction causing severe rash involving mucus membranes or skin necrosis: Unk Has patient had a PCN reaction that required hospitalization: Unk Has patient had a PCN reaction occurring within the last 10 years: Unk if all of the above answers are "NO", then may proceed with Cephalosporin use.     Family History  Problem Relation Age of Onset  . Cancer Mother   . Stroke Father   . Cirrhosis Brother     Social History Social History  Substance Use  Topics  . Smoking status: Former Smoker    Packs/day: 0.50    Years: 70.00    Quit date: 08/18/2008  . Smokeless tobacco: Former Neurosurgeon    Quit date: 10/13/2004  . Alcohol use No    Review of Systems Unable to obtain an adequate/accurate review of systems given significant dementia at baseline.  ____________________________________________   PHYSICAL EXAM:  VITAL SIGNS: ED Triage Vitals  Enc Vitals Group     BP 12/25/16 1656 121/73     Pulse Rate 12/25/16 1656 76     Resp 12/25/16 1656 18     Temp 12/25/16 1656 98.7 F (37.1 C)     Temp Source 12/25/16 1656 Oral     SpO2 12/25/16 1656 94 %     Weight 12/25/16 1656 90 lb (40.8 kg)     Height 12/25/16 1656 5\' 2"  (1.575 m)     Head Circumference --      Peak Flow --      Pain Score 12/25/16 1713 10     Pain Loc --      Pain Edu? --  Excl. in GC? --     Constitutional: Alert, calm and pleasant. Patient does not recall the fall. Has no complaints at this time. Eyes: Normal exam ENT   Head: Normocephalic and atraumatic.   Mouth/Throat: Mucous membranes are moist. Cardiovascular: Normal rate, regular rhythm Respiratory: Normal respiratory effort without tachypnea nor retractions. Breath sounds are clear. Moderate right lateral chest wall tenderness to palpation. Gastrointestinal: Soft and nontender. No distention. Musculoskeletal: Nontender with normal range of motion in all extremities. No C-spine tenderness. Mild mid T-spine tenderness, no L-spine tenderness. No deformity or ecchymosis noted. Neurologic:  Normal speech and language. No gross focal neurologic deficits  Skin:  Skin is warm, dry and intact.  Psychiatric: Mood and affect are normal.  ____________________________________________     RADIOLOGY  X-ray negative for rib fracture. No T-spine injury.  ____________________________________________   INITIAL IMPRESSION / ASSESSMENT AND PLAN / ED COURSE  Pertinent labs & imaging results that were  available during my care of the patient were reviewed by me and considered in my medical decision making (see chart for details).  The patient presents to the emergency department after a witnessed fall hitting her right chest wall on the bathtub. Patient does have moderate right lateral chest wall tenderness as well as mild mid T-spine tenderness. We'll obtain x-rays and closely monitor in the emergency department. Overall the patient appears well, calm, cooperative, no distress. Patient is pleasant but does have advanced dementia and does not recall the fall.  X-rays are largely negative. No fractures noted. Patient appears well we will discharge home. Patient agreeable and caregiver agreeable. We will sent home with an incentive spirometer. I discussed incentive spirometer usage with the caregiver.  ____________________________________________   FINAL CLINICAL IMPRESSION(S) / ED DIAGNOSES  Fall Rib contusion   Minna Antis, MD 12/25/16 1610    Minna Antis, MD 12/25/16 [phone]

## 2016-12-27 NOTE — Progress Notes (Addendum)
KELSI, BENHAM ([id number]) Visit Report for 12/25/2016 Chief Complaint Document Details Patient Name: [name], [name] Date of Service: 12/25/2016 3:00 PM Medical Record Number: [id number] Patient Account Number: [id number] Date of Birth/Sex: 1923-08-08 (83 y.o. Female) Treating RN: Clover Mealy, RN, BSN, [place] Sink Primary Care Provider: Pearson Grippe Other Clinician: Referring Provider: Pearson Grippe Treating Provider/Extender: Rudene Re in Treatment: 1 Information Obtained from: Patient Chief Complaint Patient presents to the wound care center for a consult due non healing wound to the left upper arm for about a week Electronic Signature(s) Signed: 12/25/2016 3:27:06 PM By: Evlyn Kanner MD, FACS Entered By: Evlyn Kanner on 12/25/2016 15:27:06 [name], [name] ([id number]) -------------------------------------------------------------------------------- HPI Details Patient Name: [name], [name] Date of Service: 12/25/2016 3:00 PM Medical Record Number: [id number] Patient Account Number: [id number] Date of Birth/Sex: Feb 09, 1923 (83 y.o. Female) Treating RN: Clover Mealy, RN, BSN, [place] Sink Primary Care Provider: Pearson Grippe Other Clinician: Referring Provider: Pearson Grippe Treating Provider/Extender: Rudene Re in Treatment: 1 History of Present Illness Location: left upper arm Quality: Patient reports experiencing a sharp pain to affected area(s). Severity: Patient states wound are getting worse. Duration: Patient has had the wound for < 1 week prior to presenting for treatment Timing: Pain in wound is Intermittent (comes and goes Context: The wound occurred when the patient had a fall and injured her left upper arm Modifying Factors: Other treatment(s) tried include:went to the ER with Neosporin was applied and a dressing placed Associated Signs and Symptoms: Patient reports having increase discharge. HPI Description: 83 year old patient who was recently seen in the ER for a left arm  abrasion. The patient has dementia and as per the caregivers she sustained a skin tear on her left arm. past medical history significant for arthritis, cough and chronic bronchitis, COPD, hypertension,status post appendectomy, hip fracture surgery, protein calorie malnutrition. the skin tear was dressed with Neosporin and a non-adherent dressing was applied to the arm and wrapped with gauze and was told to see the wound care service Electronic Signature(s) Signed: 12/25/2016 3:27:14 PM By: Evlyn Kanner MD, FACS Entered By: Evlyn Kanner on 12/25/2016 15:27:14 [name] ([id number]) -------------------------------------------------------------------------------- Physical Exam Details Patient Name: [name], [name] Date of Service: 12/25/2016 3:00 PM Medical Record Number: [id number] Patient Account Number: [id number] Date of Birth/Sex: [birth year] (83 y.o. Female) Treating RN: Afful, RN, BSN, [place] Sink Primary Care Provider: Pearson Grippe Other Clinician: Referring Provider: Pearson Grippe Treating Provider/Extender: Rudene Re in Treatment: 1 Constitutional . Pulse regular. Respirations normal and unlabored. Afebrile. . Eyes Nonicteric. Reactive to light. Ears, Nose, Mouth, and Throat Lips, teeth, and gums WNL.Marland Kitchen Moist mucosa without lesions. Neck supple and nontender. No palpable supraclavicular or cervical adenopathy. Normal sized without goiter. Respiratory WNL. No retractions.. Breath sounds WNL, No rubs, rales, rhonchi, or wheeze.. Cardiovascular Heart rhythm and rate regular, no murmur or gallop.. Pedal Pulses WNL. No clubbing, cyanosis or edema. Chest Breasts symmetical and no nipple discharge.. Breast tissue WNL, no masses, lumps, or tenderness.. Lymphatic No adneopathy. No adenopathy. No adenopathy. Musculoskeletal Adexa without tenderness or enlargement.. Digits and nails w/o clubbing, cyanosis, infection, petechiae, ischemia, or inflammatory  conditions.. Integumentary (Hair, Skin) No suspicious lesions. No crepitus or fluctuance. No peri-wound warmth or erythema. No masses.Marland Kitchen Psychiatric Judgement and insight Intact.. No evidence of depression, anxiety, or agitation.. Notes the skin flap which was brought down last week has taken nicely and rest of the wound has clean granulation tissue and no sharp debridement was required today. Electronic Signature(s) Signed: 12/25/2016 3:27:56 PM By:  Evlyn Kanner MD, FACS Entered By: Evlyn Kanner on 12/25/2016 15:27:55 [name] ([id number]) -------------------------------------------------------------------------------- Physician Orders Details Patient Name: [name], [name] Date of Service: 12/25/2016 3:00 PM Medical Record Number: [id number] Patient Account Number: [id number] Date of Birth/Sex: [birth year] (83 y.o. Female) Treating RN: Afful, RN, BSN, [place] Sink Primary Care Provider: Pearson Grippe Other Clinician: Referring Provider: Pearson Grippe Treating Provider/Extender: Rudene Re in Treatment: 1 Verbal / Phone Orders: No Diagnosis Coding Wound Cleansing Wound #1 Left,Lateral Upper Arm o Clean wound with Normal Saline. - in clinic Primary Wound Dressing Wound #1 Left,Lateral Upper Arm o Hydrogel o Cutimed Sorbact Secondary Dressing Wound #1 Left,Lateral Upper Arm o Kerlix and Coban o Non-adherent pad Dressing Change Frequency Wound #1 Left,Lateral Upper Arm o Change dressing every week Follow-up Appointments Wound #1 Left,Lateral Upper Arm o Return Appointment in 1 week. Electronic Signature(s) Signed: 12/25/2016 3:51:42 PM By: Elpidio Eric BSN, RN Signed: 12/25/2016 4:13:28 PM By: Evlyn Kanner MD, FACS Entered By: Elpidio Eric on 12/25/2016 15:22:23 [name] ([id number]) -------------------------------------------------------------------------------- Problem List Details Patient Name: [name], [name] Date of Service: 12/25/2016 3:00  PM Medical Record Number: [id number] Patient Account Number: [id number] Date of Birth/Sex: 20-Mar-1923 (83 y.o. Female) Treating RN: Clover Mealy, RN, BSN, [place] Sink Primary Care Provider: Pearson Grippe Other Clinician: Referring Provider: Pearson Grippe Treating Provider/Extender: Rudene Re in Treatment: 1 Active Problems ICD-10 Encounter Code Description Active Date Diagnosis [phone] Laceration without foreign body of left upper arm, initial 12/18/2016 Yes encounter S41.102A Unspecified open wound of left upper arm, initial 12/18/2016 Yes encounter E44.0 Moderate protein-calorie malnutrition 12/18/2016 Yes Inactive Problems Resolved Problems Electronic Signature(s) Signed: 12/25/2016 3:26:55 PM By: Evlyn Kanner MD, FACS Entered By: Evlyn Kanner on 12/25/2016 15:26:55 [name], [name] Kitchen ([id number]) -------------------------------------------------------------------------------- Progress Note Details Patient Name: Nesbitt, [name] Date of Service: 12/25/2016 3:00 PM Medical Record Number: [id number] Patient Account Number: [id number] Date of Birth/Sex: 05-31-1923 (83 y.o. Female) Treating RN: Afful, RN, BSN, [place] Sink Primary Care Provider: Pearson Grippe Other Clinician: Referring Provider: Pearson Grippe Treating Provider/Extender: Rudene Re in Treatment: 1 Subjective Chief Complaint Information obtained from Patient Patient presents to the wound care center for a consult due non healing wound to the left upper arm for about a week History of Present Illness (HPI) The following HPI elements were documented for the patient's wound: Location: left upper arm Quality: Patient reports experiencing a sharp pain to affected area(s). Severity: Patient states wound are getting worse. Duration: Patient has had the wound for < 1 week prior to presenting for treatment Timing: Pain in wound is Intermittent (comes and goes Context: The wound occurred when the patient had a fall and injured her left  upper arm Modifying Factors: Other treatment(s) tried include:went to the ER with Neosporin was applied and a dressing placed Associated Signs and Symptoms: Patient reports having increase discharge. 83 year old patient who was recently seen in the ER for a left arm abrasion. The patient has dementia and as per the caregivers she sustained a skin tear on her left arm. past medical history significant for arthritis, cough and chronic bronchitis, COPD, hypertension,status post appendectomy, hip fracture surgery, protein calorie malnutrition. the skin tear was dressed with Neosporin and a non-adherent dressing was applied to the arm and wrapped with gauze and was told to see the wound care service Objective Constitutional Pulse regular. Respirations normal and unlabored. Afebrile. Vitals Time Taken: 3:07 PM, Height: 60 in, Temperature: 97.6 F, Pulse: 67 bpm, Respiratory Rate: 16 breaths/min, Blood Pressure: 142/76 mmHg. Stanek,  [name] ([id number]) Eyes Nonicteric. Reactive to light. Ears, Nose, Mouth, and Throat Lips, teeth, and gums WNL.Marland Kitchen. Moist mucosa without lesions. Neck supple and nontender. No palpable supraclavicular or cervical adenopathy. Normal sized without goiter. Respiratory WNL. No retractions.. Breath sounds WNL, No rubs, rales, rhonchi, or wheeze.. Cardiovascular Heart rhythm and rate regular, no murmur or gallop.. Pedal Pulses WNL. No clubbing, cyanosis or edema. Chest Breasts symmetical and no nipple discharge.. Breast tissue WNL, no masses, lumps, or tenderness.. Lymphatic No adneopathy. No adenopathy. No adenopathy. Musculoskeletal Adexa without tenderness or enlargement.. Digits and nails w/o clubbing, cyanosis, infection, petechiae, ischemia, or inflammatory conditions.Marland Kitchen. Psychiatric Judgement and insight Intact.. No evidence of depression, anxiety, or agitation.. General Notes: the skin flap which was brought down last week has taken nicely and rest of the  wound has clean granulation tissue and no sharp debridement was required today. Integumentary (Hair, Skin) No suspicious lesions. No crepitus or fluctuance. No peri-wound warmth or erythema. No masses.. Wound #1 status is Open. Original cause of wound was Trauma. The wound is located on the Left,Lateral Upper Arm. The wound measures 3cm length x 6cm width x 0.1cm depth; 14.137cm^2 area and 1.414cm^3 volume. The wound is limited to skin breakdown. There is no tunneling or undermining noted. There is a small amount of serous drainage noted. The wound margin is flat and intact. There is large (67-100%) red, pink, friable granulation within the wound bed. There is no necrotic tissue within the wound bed. The periwound skin appearance did not exhibit: Callus, Crepitus, Excoriation, Induration, Rash, Scarring, Dry/Scaly, Maceration, Atrophie Blanche, Cyanosis, Ecchymosis, Hemosiderin Staining, Mottled, Pallor, Rubor, Erythema. Periwound temperature was noted as No Abnormality. Assessment [name], [name] ([id number]) Active Problems ICD-10 S41.112A - Laceration without foreign body of left upper arm, initial encounter S41.102A - Unspecified open wound of left upper arm, initial encounter E44.0 - Moderate protein-calorie malnutrition Plan Wound Cleansing: Wound #1 Left,Lateral Upper Arm: Clean wound with Normal Saline. - in clinic Primary Wound Dressing: Wound #1 Left,Lateral Upper Arm: Hydrogel Cutimed Sorbact Secondary Dressing: Wound #1 Left,Lateral Upper Arm: Kerlix and Coban Non-adherent pad Dressing Change Frequency: Wound #1 Left,Lateral Upper Arm: Change dressing every week Follow-up Appointments: Wound #1 Left,Lateral Upper Arm: Return Appointment in 1 week. This week I have recommended hydrogel with Sorbact, Telfa, a few pieces of gauze and a light Kerlix and Coban dressing to be left intact for the week. Electronic Signature(s) Signed: 12/25/2016 4:16:16 PM By: Evlyn KannerBritto,  Antwion Carpenter MD, FACS Previous Signature: 12/25/2016 3:29:01 PM Version By: Evlyn KannerBritto, Kamaury Cutbirth MD, FACS Entered By: Evlyn KannerBritto, Roosevelt Eimers on 12/25/2016 16:16:15 Braggs, [name] ([id number]) -------------------------------------------------------------------------------- SuperBill Details Patient Name: Smyth, Keyonda B. Date of Service: 12/25/2016 Medical Record Number: [id number] Patient Account Number: [id number] Date of Birth/Sex: 12/08/1922 (83 y.o. Female) Treating RN: Afful, RN, BSN, [place] Sinkita Primary Care Provider: [name], [name] Other Clinician: Referring Provider: [name], [name] Treating Provider/Extender: Rudene ReBritto, Delana Manganello Weeks in Treatment: 1 Diagnosis Coding ICD-10 Codes Code Description [phone] Laceration without foreign body of left upper arm, initial encounter S41.102A Unspecified open wound of left upper arm, initial encounter E44.0 Moderate protein-calorie malnutrition Facility Procedures CPT4 Code: [id number] Description: [id number] - WOUND CARE VISIT-LEV 2 EST PT Modifier: Quantity: 1 Physician Procedures CPT4 Code Description: [id number] - WC PHYS LEVEL 3 - EST PT ICD-10 Description Diagnosis S41.112A Laceration without foreign body of left upper arm, S41.102A Unspecified open wound of left upper arm, initial e E44.0 Moderate protein-calorie  malnutrition Modifier: initial encount ncounter Quantity: 1 er Psychologist, prison and probation serviceslectronic Signature(s) Signed: 12/25/2016  4:16:32 PM By: Evlyn Kanner MD, FACS Previous Signature: 12/25/2016 3:51:42 PM Version By: Elpidio Eric BSN, RN Previous Signature: 12/25/2016 4:13:28 PM Version By: Evlyn Kanner MD, FACS Previous Signature: 12/25/2016 3:29:25 PM Version By: Evlyn Kanner MD, FACS Entered By: Evlyn Kanner on 12/25/2016 16:16:31

## 2016-12-27 NOTE — Progress Notes (Signed)
TATUM, CORL ([id number]) Visit Report for 12/25/2016 Arrival Information Details Patient Name: [name], [name] Date of Service: 12/25/2016 3:00 PM Medical Record Number: [id number] Patient Account Number: [id number] Date of Birth/Sex: Feb 14, 1923 (83 y.o. Female) Treating RN: Afful, RN, BSN, [place] Sink Primary Care Skilynn Durney: Pearson Grippe Other Clinician: Referring Donja Tipping: Pearson Grippe Treating Dontell Mian/Extender: Rudene Re in Treatment: 1 Visit Information History Since Last Visit All ordered tests and consults were completed: No Patient Arrived: Ambulatory Added or deleted any medications: No Arrival Time: 15:04 Any new allergies or adverse reactions: No Accompanied By: caregiver Had a fall or experienced change in Yes Transfer Assistance: None activities of daily living that may affect Patient Identification Verified: Yes risk of falls: Secondary Verification Process Yes Signs or symptoms of abuse/neglect since last No Completed: visito Patient Has Alerts: No Hospitalized since last visit: No Has Dressing in Place as Prescribed: Yes Pain Present Now: No Electronic Signature(s) Signed: 12/25/2016 3:51:42 PM By: Elpidio Eric BSN, RN Entered By: Elpidio Eric on 12/25/2016 15:06:22 [name], [name] ([id number]) -------------------------------------------------------------------------------- Clinic Level of Care Assessment Details Patient Name: [name], [name] Date of Service: 12/25/2016 3:00 PM Medical Record Number: [id number] Patient Account Number: [id number] Date of Birth/Sex: Dec 20, 1922 (83 y.o. Female) Treating RN: Afful, RN, BSN, [place] Sink Primary Care Sila Sarsfield: Pearson Grippe Other Clinician: Referring Pratt Bress: Pearson Grippe Treating Sharry Beining/Extender: Rudene Re in Treatment: 1 Clinic Level of Care Assessment Items TOOL 4 Quantity Score []  - Use when only an EandM is performed on FOLLOW-UP visit 0 ASSESSMENTS - Nursing Assessment / Reassessment X -  Reassessment of Co-morbidities (includes updates in patient status) 1 10 X - Reassessment of Adherence to Treatment Plan 1 5 ASSESSMENTS - Wound and Skin Assessment / Reassessment X - Simple Wound Assessment / Reassessment - one wound 1 5 []  - Complex Wound Assessment / Reassessment - multiple wounds 0 []  - Dermatologic / Skin Assessment (not related to wound area) 0 ASSESSMENTS - Focused Assessment []  - Circumferential Edema Measurements - multi extremities 0 []  - Nutritional Assessment / Counseling / Intervention 0 []  - Lower Extremity Assessment (monofilament, tuning fork, pulses) 0 []  - Peripheral Arterial Disease Assessment (using hand held doppler) 0 ASSESSMENTS - Ostomy and/or Continence Assessment and Care []  - Incontinence Assessment and Management 0 []  - Ostomy Care Assessment and Management (repouching, etc.) 0 PROCESS - Coordination of Care X - Simple Patient / Family Education for ongoing care 1 15 []  - Complex (extensive) Patient / Family Education for ongoing care 0 []  - Staff obtains Chiropractor, Records, Test Results / Process Orders 0 []  - Staff telephones HHA, Nursing Homes / Clarify orders / etc 0 []  - Routine Transfer to another Facility (non-emergent condition) 0 [name], [name] ([id number]) []  - Routine Hospital Admission (non-emergent condition) 0 []  - New Admissions / Manufacturing engineer / Ordering NPWT, Apligraf, etc. 0 []  - Emergency Hospital Admission (emergent condition) 0 []  - Simple Discharge Coordination 0 []  - Complex (extensive) Discharge Coordination 0 PROCESS - Special Needs []  - Pediatric / Minor Patient Management 0 []  - Isolation Patient Management 0 []  - Hearing / Language / Visual special needs 0 []  - Assessment of Community assistance (transportation, D/C planning, etc.) 0 []  - Additional assistance / Altered mentation 0 []  - Support Surface(s) Assessment (bed, cushion, seat, etc.) 0 INTERVENTIONS - Wound Cleansing / Measurement X -  Simple Wound Cleansing - one wound 1 5 []  - Complex Wound Cleansing - multiple wounds 0 X - Wound Imaging (photographs - any  number of wounds) 1 5 []  - Wound Tracing (instead of photographs) 0 X - Simple Wound Measurement - one wound 1 5 []  - Complex Wound Measurement - multiple wounds 0 INTERVENTIONS - Wound Dressings X - Small Wound Dressing one or multiple wounds 1 10 []  - Medium Wound Dressing one or multiple wounds 0 []  - Large Wound Dressing one or multiple wounds 0 []  - Application of Medications - topical 0 []  - Application of Medications - injection 0 INTERVENTIONS - Miscellaneous []  - External ear exam 0 [name], [name] ([id number]) []  - Specimen Collection (cultures, biopsies, blood, body fluids, etc.) 0 []  - Specimen(s) / Culture(s) sent or taken to Lab for analysis 0 []  - Patient Transfer (multiple staff / Michiel Sites Lift / Similar devices) 0 []  - Simple Staple / Suture removal (25 or less) 0 []  - Complex Staple / Suture removal (26 or more) 0 []  - Hypo / Hyperglycemic Management (close monitor of Blood Glucose) 0 []  - Ankle / Brachial Index (ABI) - do not check if billed separately 0 X - Vital Signs 1 5 Has the patient been seen at the hospital within the last three years: Yes Total Score: 65 Level Of Care: New/Established - Level 2 Electronic Signature(s) Signed: 12/25/2016 3:51:42 PM By: Elpidio Eric BSN, RN Entered By: Elpidio Eric on 12/25/2016 15:33:49 [name], [name] ([id number]) -------------------------------------------------------------------------------- Encounter Discharge Information Details Patient Name: [name], [name] Date of Service: 12/25/2016 3:00 PM Medical Record Number: [id number] Patient Account Number: [id number] Date of Birth/Sex: 1923-08-10 (83 y.o. Female) Treating RN: Clover Mealy, RN, BSN, [place] Sink Primary Care Xareni Kelch: Pearson Grippe Other Clinician: Referring Harli Engelken: Pearson Grippe Treating Areesha Dehaven/Extender: Rudene Re in Treatment:  1 Encounter Discharge Information Items Discharge Pain Level: 0 Discharge Condition: Stable Ambulatory Status: Ambulatory Discharge Destination: Home Transportation: Private Auto Accompanied By: caregiver Schedule Follow-up Appointment: No Medication Reconciliation completed and provided to Patient/Care No Hurschel Paynter: Provided on Clinical Summary of Care: 12/25/2016 Form Type Recipient Paper Patient CS Electronic Signature(s) Signed: 12/25/2016 3:51:42 PM By: Elpidio Eric BSN, RN Previous Signature: 12/25/2016 3:34:04 PM Version By: Gwenlyn Perking Entered By: Elpidio Eric on 12/25/2016 15:36:05 Doan, Marcelle BMarland Kitchen ([id number]) -------------------------------------------------------------------------------- Lower Extremity Assessment Details Patient Name: [name], [name] Date of Service: 12/25/2016 3:00 PM Medical Record Number: [id number] Patient Account Number: [id number] Date of Birth/Sex: 1922/12/30 (83 y.o. Female) Treating RN: Afful, RN, BSN, [employer] Primary Care Klani Caridi: Pearson Grippe Other Clinician: Referring Opie Maclaughlin: Pearson Grippe Treating Kristiane Morsch/Extender: Rudene Re in Treatment: 1 Electronic Signature(s) Signed: 12/25/2016 3:51:42 PM By: Elpidio Eric BSN, RN Entered By: Elpidio Eric on 12/25/2016 15:08:19 Cerasoli, [name] ([id number]) -------------------------------------------------------------------------------- Multi Wound Chart Details Patient Name: [name], [name] Date of Service: 12/25/2016 3:00 PM Medical Record Number: [id number] Patient Account Number: [id number] Date of Birth/Sex: April 12, 1923 (83 y.o. Female) Treating RN: Clover Mealy, RN, BSN, [place] Sink Primary Care  Grosser: Pearson Grippe Other Clinician: Referring Yancy Hascall: Pearson Grippe Treating Arnisha Laffoon/Extender: Rudene Re in Treatment: 1 Vital Signs Height(in): 60 Pulse(bpm): 67 Weight(lbs): Blood Pressure 142/76 (mmHg): Body Mass Index(BMI): Temperature(F): 97.6 Respiratory  Rate 16 (breaths/min): Photos: [1:No Photos] [N/A:N/A] Wound Location: [1:Left Upper Arm - Lateral] [N/A:N/A] Wounding Event: [1:Trauma] [N/A:N/A] Primary Etiology: [1:Skin Tear] [N/A:N/A] Comorbid History: [1:Chronic Obstructive Pulmonary Disease (COPD), Hypertension, Gout, Dementia] [N/A:N/A] Date Acquired: [1:12/13/2016] [N/A:N/A] Weeks of Treatment: [1:1] [N/A:N/A] Wound Status: [1:Open] [N/A:N/A] Measurements L x W x D 3x6x0.1 [N/A:N/A] (cm) Area (cm) : [1:14.137] [N/A:N/A] Volume (cm) : [1:1.414] [N/A:N/A] % Reduction in Area: [1:34.80%] [N/A:N/A] % Reduction in Volume:  34.80% [N/A:N/A] Classification: [1:Partial Thickness] [N/A:N/A] Exudate Amount: [1:Small] [N/A:N/A] Exudate Type: [1:Serous] [N/A:N/A] Exudate Color: [1:amber] [N/A:N/A] Wound Margin: [1:Flat and Intact] [N/A:N/A] Granulation Amount: [1:Large (67-100%)] [N/A:N/A] Granulation Quality: [1:Red, Pink, Friable] [N/A:N/A] Necrotic Amount: [1:None Present (0%)] [N/A:N/A] Exposed Structures: [1:Fascia: No Fat Layer (Subcutaneous Tissue) Exposed: No Tendon: No Muscle: No] [N/A:N/A] Joint: No Bone: No Limited to Skin Breakdown Epithelialization: Medium (34-66%) N/A N/A Periwound Skin Texture: Excoriation: No N/A N/A Induration: No Callus: No Crepitus: No Rash: No Scarring: No Periwound Skin Maceration: No N/A N/A Moisture: Dry/Scaly: No Periwound Skin Color: Atrophie Blanche: No N/A N/A Cyanosis: No Ecchymosis: No Erythema: No Hemosiderin Staining: No Mottled: No Pallor: No Rubor: No Tenderness on Yes N/A N/A Palpation: Wound Preparation: Ulcer Cleansing: N/A N/A Rinsed/Irrigated with Saline Topical Anesthetic Applied: None Treatment Notes Electronic Signature(s) Signed: 12/25/2016 3:26:59 PM By: Evlyn Kanner MD, FACS Entered By: Evlyn Kanner on 12/25/2016 15:26:59 Volpi, [name]  ([id number]) -------------------------------------------------------------------------------- Multi-Disciplinary Care Plan Details Patient Name: [name], [name] Date of Service: 12/25/2016 3:00 PM Medical Record Number: [id number] Patient Account Number: [id number] Date of Birth/Sex: 1923-07-30 (83 y.o. Female) Treating RN: Afful, RN, BSN, [place] Sink Primary Care Rafe Mackowski: Pearson Grippe Other Clinician: Referring Wilmer Santillo: Pearson Grippe Treating Marticia Reifschneider/Extender: Rudene Re in Treatment: 1 Active Inactive ` Abuse / Safety / Falls / Self Care Management Nursing Diagnoses: History of Falls Impaired physical mobility Goals: Patient will not experience any injury related to falls Date Initiated: 12/18/2016 Target Resolution Date: 01/15/2017 Goal Status: Active Interventions: Podiatry chair, stretcher in low position and side rails up as needed Provide education on safe transfers Notes: ` Orientation to the Wound Care Program Nursing Diagnoses: Knowledge deficit related to the wound healing center program Goals: Patient/caregiver will verbalize understanding of the Wound Healing Center Program Date Initiated: 12/18/2016 Target Resolution Date: 01/15/2017 Goal Status: Active Interventions: Provide education on orientation to the wound center Notes: ` Wound/Skin Impairment Nursing Diagnoses: MATAYA, KILDUFF ([id number]) Impaired tissue integrity Goals: Ulcer/skin breakdown will heal within 14 weeks Date Initiated: 12/18/2016 Target Resolution Date: 01/15/2017 Goal Status: Active Interventions: Assess patient/caregiver ability to obtain necessary supplies Treatment Activities: Skin care regimen initiated : 12/18/2016 Notes: Electronic Signature(s) Signed: 12/25/2016 3:51:42 PM By: Elpidio Eric BSN, RN Entered By: Elpidio Eric on 12/25/2016 15:20:24 [name], [name] ([id number]) -------------------------------------------------------------------------------- Pain Assessment  Details Patient Name: [name], [name] Date of Service: 12/25/2016 3:00 PM Medical Record Number: [id number] Patient Account Number: [id number] Date of Birth/Sex: 09-09-1922 (83 y.o. Female) Treating RN: Clover Mealy, RN, BSN, [place] Sink Primary Care Lidya Mccalister: Pearson Grippe Other Clinician: Referring Chyanna Flock: Pearson Grippe Treating Kenyan Karnes/Extender: Rudene Re in Treatment: 1 Active Problems Location of Pain Severity and Description of Pain Patient Has Paino Yes Site Locations Pain Location: Generalized Pain Rate the pain. Current Pain Level: 3 Pain Management and Medication Current Pain Management: Electronic Signature(s) Signed: 12/25/2016 3:51:42 PM By: Elpidio Eric BSN, RN Entered By: Elpidio Eric on 12/25/2016 15:06:12 Nedd, [name] ([id number]) -------------------------------------------------------------------------------- Patient/Caregiver Education Details Patient Name: [name] Date of Service: 12/25/2016 3:00 PM Medical Record Number: [id number] Patient Account Number: [id number] Date of Birth/Gender: 1922-11-22 (83 y.o. Female) Treating RN: Clover Mealy, RN, BSN, [place] Sink Primary Care Physician: Pearson Grippe Other Clinician: Referring Physician: Pearson Grippe Treating Physician/Extender: Rudene Re in Treatment: 1 Education Assessment Education Provided To: Patient Education Topics Provided Safety: Methods: Explain/Verbal Responses: State content correctly Welcome To The Wound Care Center: Methods: Explain/Verbal Responses: State content correctly Wound/Skin Impairment: Methods: Explain/Verbal Responses: State content correctly Electronic Signature(s)  Signed: 12/25/2016 3:51:42 PM By: Elpidio EricAfful, Rita BSN, RN Entered By: Elpidio EricAfful, Rita on 12/25/2016 15:36:22 Tyson, [name] ([id number]) -------------------------------------------------------------------------------- Wound Assessment Details Patient Name: Mcglaun, Amyia B. Date of Service: 12/25/2016 3:00  PM Medical Record Number: [id number] Patient Account Number: [id number] Date of Birth/Sex: 06/04/1923 (83 y.o. Female) Treating RN: Afful, RN, BSN, Rita Primary Care Tiffiney Sparrow: [name], [name] Other Clinician: Referring Gerold Sar: [name], [name] Treating Francelia Mclaren/Extender: Rudene ReBritto, Errol Weeks in Treatment: 1 Wound Status Wound Number: 1 Primary Skin Tear Etiology: Wound Location: Left Upper Arm - Lateral Wound Open Wounding Event: Trauma Status: Date Acquired: 12/13/2016 Comorbid Chronic Obstructive Pulmonary Weeks Of Treatment: 1 History: Disease (COPD), Hypertension, Gout, Clustered Wound: No Dementia Photos Wound Measurements Length: (cm) 3 Width: (cm) 6 Depth: (cm) 0.1 Area: (cm) 14.137 Volume: (cm) 1.414 % Reduction in Area: 34.8% % Reduction in Volume: 34.8% Epithelialization: Medium (34-66%) Tunneling: No Undermining: No Wound Description Classification: Partial Thickness Wound Margin: Flat and Intact Exudate Amount: Small Exudate Type: Serous Exudate Color: amber Shrestha, Leane B. ([id number]) Foul Odor After Cleansing: No Slough/Fibrino No Wound Bed Granulation Amount: Large (67-100%) Exposed Structure Granulation Quality: Red, Pink, Friable Fascia Exposed: No Necrotic Amount: None Present (0%) Fat Layer (Subcutaneous Tissue) Exposed: No Tendon Exposed: No Muscle Exposed: No Joint Exposed: No Bone Exposed: No Limited to Skin Breakdown Periwound Skin Texture Texture Color No Abnormalities Noted: No No Abnormalities Noted: No Callus: No Atrophie Blanche: No Crepitus: No Cyanosis: No Excoriation: No Ecchymosis: No Induration: No Erythema: No Rash: No Hemosiderin Staining: No Scarring: No Mottled: No Pallor: No Moisture Rubor: No No Abnormalities Noted: No Dry / Scaly: No Temperature / Pain Maceration: No Temperature: No Abnormality Wound Preparation Ulcer Cleansing: Rinsed/Irrigated with Saline Topical Anesthetic Applied: None Treatment  Notes Wound #1 (Left, Lateral Upper Arm) 1. Cleansed with: Clean wound with Normal Saline 4. Dressing Applied: Hydrogel Other dressing (specify in notes) 5. Secondary Dressing Applied Non-Adherent pad Gauze and Kerlix/Conform Notes kerlix and coban lightly to secure. Electronic Signature(s) Signed: 12/25/2016 3:50:55 PM By: Elpidio EricAfful, Rita BSN, RN Entered By: Elpidio EricAfful, Rita on 12/25/2016 15:50:55 Searson, [name] ([id number]) Crespi, [name] ([id number]) -------------------------------------------------------------------------------- Vitals Details Patient Name: Swartout, Darice B. Date of Service: 12/25/2016 3:00 PM Medical Record Number: [id number] Patient Account Number: [id number] Date of Birth/Sex: 09/04/1922 (83 y.o. Female) Treating RN: Afful, RN, BSN, Rita Primary Care [place] Koors: [name], [name] Other Clinician: Referring Jamichael Knotts: [name], [name] Treating Menachem Urbanek/Extender: Rudene ReBritto, Errol Weeks in Treatment: 1 Vital Signs Time Taken: 15:07 Temperature (F): 97.6 Height (in): 60 Pulse (bpm): 67 Respiratory Rate (breaths/min): 16 Blood Pressure (mmHg): 142/76 Reference Range: 80 - 120 mg / dl Electronic Signature(s) Signed: 12/25/2016 3:51:42 PM By: Elpidio EricAfful, Rita BSN, RN Entered By: Elpidio EricAfful, Rita on 12/25/2016 15:07:49

## 2016-12-27 NOTE — Progress Notes (Signed)
Lindsay HammingSIMPSON, Angee B. ([id number]) Visit Report for 12/25/2016 Fall Risk Assessment Details Patient Name: [name], [name] Date of Service: 12/25/2016 3:00 PM Medical Record Number: [id number] Patient Account Number: [id number] Date of Birth/Sex: 05/21/1923 (83 y.o. Female) Treating RN: Afful, RN, BSN, Rita Primary Care Alayshia Marini: [name], [name] Other Clinician: Referring Logun Colavito: [name], [name] Treating Dangela How/Extender: Rudene ReBritto, Errol Weeks in Treatment: 1 Fall Risk Assessment Items Have you had 2 or more falls in the last 12 monthso 0 Yes Have you had any fall that resulted in injury in the last 12 monthso 0 Yes FALL RISK ASSESSMENT: History of falling - immediate or within 3 months 25 Yes Secondary diagnosis 0 No Ambulatory aid None/bed rest/wheelchair/nurse 0 Yes Crutches/cane/walker 0 No Furniture 0 No IV Access/Saline Lock 0 No Gait/Training Normal/bed rest/immobile 0 Yes Weak 10 Yes Impaired 0 No Mental Status Oriented to own ability 0 Yes Electronic Signature(s) Signed: 12/25/2016 3:51:42 PM By: Elpidio EricAfful, Rita BSN, RN Entered By: Elpidio EricAfful, Rita on 12/25/2016 15:20:15

## 2016-12-31 DIAGNOSIS — F0281 Dementia in other diseases classified elsewhere with behavioral disturbance: Secondary | ICD-10-CM | POA: Diagnosis not present

## 2016-12-31 DIAGNOSIS — J449 Chronic obstructive pulmonary disease, unspecified: Secondary | ICD-10-CM | POA: Diagnosis not present

## 2016-12-31 DIAGNOSIS — I10 Essential (primary) hypertension: Secondary | ICD-10-CM | POA: Diagnosis not present

## 2016-12-31 DIAGNOSIS — G301 Alzheimer's disease with late onset: Secondary | ICD-10-CM | POA: Diagnosis not present

## 2017-01-01 ENCOUNTER — Encounter: Payer: Medicare HMO | Admitting: Surgery

## 2017-01-01 DIAGNOSIS — K219 Gastro-esophageal reflux disease without esophagitis: Secondary | ICD-10-CM | POA: Diagnosis not present

## 2017-01-01 DIAGNOSIS — S41112A Laceration without foreign body of left upper arm, initial encounter: Secondary | ICD-10-CM | POA: Diagnosis not present

## 2017-01-01 DIAGNOSIS — S41102A Unspecified open wound of left upper arm, initial encounter: Secondary | ICD-10-CM | POA: Diagnosis not present

## 2017-01-01 DIAGNOSIS — J449 Chronic obstructive pulmonary disease, unspecified: Secondary | ICD-10-CM | POA: Diagnosis not present

## 2017-01-01 DIAGNOSIS — F0390 Unspecified dementia without behavioral disturbance: Secondary | ICD-10-CM | POA: Diagnosis not present

## 2017-01-01 DIAGNOSIS — I10 Essential (primary) hypertension: Secondary | ICD-10-CM | POA: Diagnosis not present

## 2017-01-01 DIAGNOSIS — M1990 Unspecified osteoarthritis, unspecified site: Secondary | ICD-10-CM | POA: Diagnosis not present

## 2017-01-01 DIAGNOSIS — M109 Gout, unspecified: Secondary | ICD-10-CM | POA: Diagnosis not present

## 2017-01-01 DIAGNOSIS — E440 Moderate protein-calorie malnutrition: Secondary | ICD-10-CM | POA: Diagnosis not present

## 2017-01-03 NOTE — Progress Notes (Signed)
ADELAE, YODICE ([id number]) Visit Report for 01/01/2017 Chief Complaint Document Details Patient Name: Klooster, Deon B. Date of Service: 01/01/2017 3:00 PM Medical Record Number: [id number] Patient Account Number: [id number] Date of Birth/Sex: [birth year] (83 y.o. Female) Treating RN: Clover Mealy, RN, BSN, [place] Sink Primary Care Provider: Pearson Grippe Other Clinician: Referring Provider: Pearson Grippe Treating Provider/Extender: Rudene Re in Treatment: 2 Information Obtained from: Patient Chief Complaint Patient presents to the wound care center for a consult due non healing wound to the left upper arm for about a week Electronic Signature(s) Signed: 01/01/2017 3:36:20 PM By: Evlyn Kanner MD, FACS Entered By: Evlyn Kanner on 01/01/2017 15:36:20 Tarnowski, Archer B. ([id number]) -------------------------------------------------------------------------------- HPI Details Patient Name: Ruffalo, Ivanell B. Date of Service: 01/01/2017 3:00 PM Medical Record Number: [id number] Patient Account Number: [id number] Date of Birth/Sex: 08-May-1923 (83 y.o. Female) Treating RN: Clover Mealy, RN, BSN, [place] Sink Primary Care Provider: Pearson Grippe Other Clinician: Referring Provider: Pearson Grippe Treating Provider/Extender: Rudene Re in Treatment: 2 History of Present Illness Location: left upper arm Quality: Patient reports experiencing a sharp pain to affected area(s). Severity: Patient states wound are getting worse. Duration: Patient has had the wound for < 1 week prior to presenting for treatment Timing: Pain in wound is Intermittent (comes and goes Context: The wound occurred when the patient had a fall and injured her left upper arm Modifying Factors: Other treatment(s) tried include:went to the ER with Neosporin was applied and a dressing placed Associated Signs and Symptoms: Patient reports having increase discharge. HPI Description: 83 year old patient who was recently seen in the ER for a left arm  abrasion. The patient has dementia and as per the caregivers she sustained a skin tear on her left arm. past medical history significant for arthritis, cough and chronic bronchitis, COPD, hypertension,status post appendectomy, hip fracture surgery, protein calorie malnutrition. the skin tear was dressed with Neosporin and a non-adherent dressing was applied to the arm and wrapped with gauze and was told to see the wound care service Electronic Signature(s) Signed: 01/01/2017 3:36:28 PM By: Evlyn Kanner MD, FACS Entered By: Evlyn Kanner on 01/01/2017 15:36:27 Leasure, Larinda Buttery ([id number]) -------------------------------------------------------------------------------- Physical Exam Details Patient Name: Govea, Ronnell B. Date of Service: 01/01/2017 3:00 PM Medical Record Number: [id number] Patient Account Number: [id number] Date of Birth/Sex: 08-18-1923 (83 y.o. Female) Treating RN: Afful, RN, BSN, [place] Sink Primary Care Provider: Pearson Grippe Other Clinician: Referring Provider: Pearson Grippe Treating Provider/Extender: Rudene Re in Treatment: 2 Constitutional . Pulse regular. Respirations normal and unlabored. Afebrile. . Eyes Nonicteric. Reactive to light. Ears, Nose, Mouth, and Throat Lips, teeth, and gums WNL.Marland Kitchen Moist mucosa without lesions. Neck supple and nontender. No palpable supraclavicular or cervical adenopathy. Normal sized without goiter. Respiratory WNL. No retractions.. Breath sounds WNL, No rubs, rales, rhonchi, or wheeze.. Cardiovascular Heart rhythm and rate regular, no murmur or gallop.. Pedal Pulses WNL. No clubbing, cyanosis or edema. Genitourinary (GU) No hydrocele, spermatocele, tenderness of the cord, or testicular mass.Marland Kitchen Penis without lesions.Renetta Chalk without lesions. No cystocele, or rectocele. Pelvic support intact, no discharge.Marland Kitchen Urethra without masses, tenderness or scarring.Marland Kitchen Lymphatic No adneopathy. No adenopathy. No  adenopathy. Musculoskeletal Adexa without tenderness or enlargement.. Digits and nails w/o clubbing, cyanosis, infection, petechiae, ischemia, or inflammatory conditions.. Integumentary (Hair, Skin) No suspicious lesions. No crepitus or fluctuance. No peri-wound warmth or erythema. No masses.Marland Kitchen Psychiatric Judgement and insight Intact.. No evidence of depression, anxiety, or agitation.. Notes except for a few areas which are still open, most of the wound is epithelialized and looks  clean with no evidence of cellulitis. No sharp debridement was required today. Electronic Signature(s) Signed: 01/01/2017 3:37:01 PM By: Evlyn Kanner MD, FACS Entered By: Evlyn Kanner on 01/01/2017 15:37:01 Alemu, Larinda Buttery ([id number]) -------------------------------------------------------------------------------- Physician Orders Details Patient Name: Barfoot, [name] Date of Service: 01/01/2017 3:00 PM Medical Record Number: [id number] Patient Account Number: [id number] Date of Birth/Sex: 05-May-1923 (83 y.o. Female) Treating RN: Afful, RN, BSN, [place] Sink Primary Care Provider: Pearson Grippe Other Clinician: Referring Provider: Pearson Grippe Treating Provider/Extender: Rudene Re in Treatment: 2 Verbal / Phone Orders: No Diagnosis Coding Wound Cleansing Wound #1 Left,Lateral Upper Arm o Clean wound with Normal Saline. - in clinic Primary Wound Dressing Wound #1 Left,Lateral Upper Arm o Hydrogel o Cutimed Sorbact Secondary Dressing Wound #1 Left,Lateral Upper Arm o Kerlix and Coban o Non-adherent pad Dressing Change Frequency Wound #1 Left,Lateral Upper Arm o Change dressing every week Follow-up Appointments Wound #1 Left,Lateral Upper Arm o Return Appointment in 1 week. Electronic Signature(s) Signed: 01/01/2017 4:13:25 PM By: Evlyn Kanner MD, FACS Signed: 01/01/2017 4:57:34 PM By: Elpidio Eric BSN, RN Entered By: Elpidio Eric on 01/01/2017 15:09:48 Gilkes, Larinda Buttery  ([id number]) -------------------------------------------------------------------------------- Problem List Details Patient Name: Elk, Jasmina B. Date of Service: 01/01/2017 3:00 PM Medical Record Number: [id number] Patient Account Number: [id number] Date of Birth/Sex: [birth year] (83 y.o. Female) Treating RN: Clover Mealy, RN, BSN, [place] Sink Primary Care Provider: Pearson Grippe Other Clinician: Referring Provider: Pearson Grippe Treating Provider/Extender: Rudene Re in Treatment: 2 Active Problems ICD-10 Encounter Code Description Active Date Diagnosis [phone] Laceration without foreign body of left upper arm, initial 12/18/2016 Yes encounter S41.102A Unspecified open wound of left upper arm, initial 12/18/2016 Yes encounter E44.0 Moderate protein-calorie malnutrition 12/18/2016 Yes Inactive Problems Resolved Problems Electronic Signature(s) Signed: 01/01/2017 3:36:07 PM By: Evlyn Kanner MD, FACS Entered By: Evlyn Kanner on 01/01/2017 15:36:06 [place]calada, Larinda Buttery ([id number]) -------------------------------------------------------------------------------- Progress Note Details Patient Name: Rosa, Tyler B. Date of Service: 01/01/2017 3:00 PM Medical Record Number: [id number] Patient Account Number: [id number] Date of Birth/Sex: 1923/02/07 (83 y.o. Female) Treating RN: Afful, RN, BSN,  Sink Primary Care Provider: Pearson Grippe Other Clinician: Referring Provider: Pearson Grippe Treating Provider/Extender: Rudene Re in Treatment: 2 Subjective Chief Complaint Information obtained from Patient Patient presents to the wound care center for a consult due non healing wound to the left upper arm for about a week History of Present Illness (HPI) The following HPI elements were documented for the patient's wound: Location: left upper arm Quality: Patient reports experiencing a sharp pain to affected area(s). Severity: Patient states wound are getting worse. Duration: Patient has had the wound  for < 1 week prior to presenting for treatment Timing: Pain in wound is Intermittent (comes and goes Context: The wound occurred when the patient had a fall and injured her left upper arm Modifying Factors: Other treatment(s) tried include:went to the ER with Neosporin was applied and a dressing placed Associated Signs and Symptoms: Patient reports having increase discharge. 83 year old patient who was recently seen in the ER for a left arm abrasion. The patient has dementia and as per the caregivers she sustained a skin tear on her left arm. past medical history significant for arthritis, cough and chronic bronchitis, COPD, hypertension,status post appendectomy, hip fracture surgery, protein calorie malnutrition. the skin tear was dressed with Neosporin and a non-adherent dressing was applied to the arm and wrapped with gauze and was told to see the wound care service Objective Constitutional Pulse regular. Respirations normal and unlabored. Afebrile. Vitals Time Taken: 2:56  PM, Height: 60 in, Pulse: 71 bpm, Respiratory Rate: 16 breaths/min, Blood Pressure: 149/78 mmHg. Medici, Ronne B. ([id number]) Eyes Nonicteric. Reactive to light. Ears, Nose, Mouth, and Throat Lips, teeth, and gums WNL.Marland Kitchen Moist mucosa without lesions. Neck supple and nontender. No palpable supraclavicular or cervical adenopathy. Normal sized without goiter. Respiratory WNL. No retractions.. Breath sounds WNL, No rubs, rales, rhonchi, or wheeze.. Cardiovascular Heart rhythm and rate regular, no murmur or gallop.. Pedal Pulses WNL. No clubbing, cyanosis or edema. Genitourinary (GU) No hydrocele, spermatocele, tenderness of the cord, or testicular mass.Marland Kitchen Penis without lesions.Renetta Chalk without lesions. No cystocele, or rectocele. Pelvic support intact, no discharge.Marland Kitchen Urethra without masses, tenderness or scarring.Marland Kitchen Lymphatic No adneopathy. No adenopathy. No adenopathy. Musculoskeletal Adexa without tenderness  or enlargement.. Digits and nails w/o clubbing, cyanosis, infection, petechiae, ischemia, or inflammatory conditions.Marland Kitchen Psychiatric Judgement and insight Intact.. No evidence of depression, anxiety, or agitation.. General Notes: except for a few areas which are still open, most of the wound is epithelialized and looks clean with no evidence of cellulitis. No sharp debridement was required today. Integumentary (Hair, Skin) No suspicious lesions. No crepitus or fluctuance. No peri-wound warmth or erythema. No masses.. Wound #1 status is Open. Original cause of wound was Trauma. The wound is located on the Left,Lateral Upper Arm. The wound measures 2.5cm length x 5cm width x 0.1cm depth; 9.817cm^2 area and 0.982cm^3 volume. There is Fat Layer (Subcutaneous Tissue) Exposed exposed. There is no tunneling noted. There is a medium amount of serous drainage noted. The wound margin is flat and intact. There is large (67-100%) red, pink, friable granulation within the wound bed. There is no necrotic tissue within the wound bed. The periwound skin appearance did not exhibit: Callus, Crepitus, Excoriation, Induration, Rash, Scarring, Dry/Scaly, Maceration, Atrophie Blanche, Cyanosis, Ecchymosis, Hemosiderin Staining, Mottled, Pallor, Rubor, Erythema. Periwound temperature was noted as No Abnormality. ZONIA, CAPLIN ([id number]) Assessment Active Problems ICD-10 S41.112A - Laceration without foreign body of left upper arm, initial encounter S41.102A - Unspecified open wound of left upper arm, initial encounter E44.0 - Moderate protein-calorie malnutrition Plan Wound Cleansing: Wound #1 Left,Lateral Upper Arm: Clean wound with Normal Saline. - in clinic Primary Wound Dressing: Wound #1 Left,Lateral Upper Arm: Hydrogel Cutimed Sorbact Secondary Dressing: Wound #1 Left,Lateral Upper Arm: Kerlix and Coban Non-adherent pad Dressing Change Frequency: Wound #1 Left,Lateral Upper Arm: Change  dressing every week Follow-up Appointments: Wound #1 Left,Lateral Upper Arm: Return Appointment in 1 week. She has made an excellent recovery and at this stage I have recommended hydrogel with Sorbact, Telfa, a few pieces of gauze and a light Kerlix and Coban dressing to be left intact for the week. I anticipate discharge soon Electronic Signature(s) Signed: 01/01/2017 3:38:20 PM By: Evlyn Kanner MD, FACS Entered By: Evlyn Kanner on 01/01/2017 15:38:19 Kreger, Larinda Buttery ([id number]) Jaso, Larinda Buttery ([id number]) -------------------------------------------------------------------------------- SuperBill Details Patient Name: Nantz, Pansy B. Date of Service: 01/01/2017 Medical Record Number: [id number] Patient Account Number: [id number] Date of Birth/Sex: 1922/09/12 (83 y.o. Female) Treating RN: Afful, RN, BSN, [place] Sink Primary Care Provider: Pearson Grippe Other Clinician: Referring Provider: Pearson Grippe Treating Provider/Extender: Rudene Re in Treatment: 2 Diagnosis Coding ICD-10 Codes Code Description [phone] Laceration without foreign body of left upper arm, initial encounter S41.102A Unspecified open wound of left upper arm, initial encounter E44.0 Moderate protein-calorie malnutrition Facility Procedures CPT4 Code: [id number] Description: 03474 - WOUND CARE VISIT-LEV 2 EST PT Modifier: Quantity: 1 Physician Procedures CPT4 Code Description: [id number] 99213 - WC PHYS LEVEL 3 - EST  PT ICD-10 Description Diagnosis S41.112A Laceration without foreign body of left upper arm, S41.102A Unspecified open wound of left upper arm, initial e E44.0 Moderate protein-calorie  malnutrition Modifier: initial encount ncounter Quantity: 1 er Psychologist, [facility] Signature(s) Signed: 01/01/2017 3:38:48 PM By: Evlyn KannerBritto, Braxon Suder MD, FACS Entered By: Evlyn KannerBritto, Aeriel Boulay on 01/01/2017 15:38:48

## 2017-01-03 NOTE — Progress Notes (Addendum)
[name], Hermina B. ([id number]) Visit Report for 01/01/2017 Arrival Information Details Patient Name: [name], [name] Date of Service: 01/01/2017 3:00 PM Medical Record Number: [id number] Patient Account Number: [id number] Date of Birth/Sex: 04/15/1923 (83 y.o. Female) Treating RN: Afful, RN, BSN, [place] Sinkita Primary Care Zenovia Justman: [name], [name] Other Clinician: Referring Jereld Presti: [name], [name] Treating Ludia Gartland/Extender: Rudene ReBritto, Errol Weeks in Treatment: 2 Visit Information History Since Last Visit All ordered tests and consults were completed: No Patient Arrived: Ambulatory Added or deleted any medications: No Arrival Time: 14:50 Any new allergies or adverse reactions: No Accompanied By: self Had a fall or experienced change in No Transfer Assistance: None activities of daily living that may affect Patient Identification Verified: Yes risk of falls: Secondary Verification Process Yes Signs or symptoms of abuse/neglect since last No Completed: visito Patient Has Alerts: No Has Dressing in Place as Prescribed: Yes Pain Present Now: No Electronic Signature(s) Signed: 01/01/2017 2:51:15 PM By: Elpidio EricAfful, Rita BSN, RN Entered By: Elpidio EricAfful, Rita on 01/01/2017 14:51:15 Riolo, Larinda ButteryALLIE B. ([id number]) -------------------------------------------------------------------------------- Clinic Level of Care Assessment Details Patient Name: [name], [name] Date of Service: 01/01/2017 3:00 PM Medical Record Number: [id number] Patient Account Number: [id number] Date of Birth/Sex: 06/18/1923 (83 y.o. Female) Treating RN: Afful, RN, BSN, [place] Sinkita Primary Care Escarlet Saathoff: [name], [name] Other Clinician: Referring Chaley Castellanos: [name], [name] Treating Ahana Najera/Extender: Rudene ReBritto, Errol Weeks in Treatment: 2 Clinic Level of Care Assessment Items TOOL 4 Quantity Score []  - Use when only an EandM is performed on FOLLOW-UP visit 0 ASSESSMENTS - Nursing Assessment / Reassessment X - Reassessment of Co-morbidities (includes updates  in patient status) 1 10 X - Reassessment of Adherence to Treatment Plan 1 5 ASSESSMENTS - Wound and Skin Assessment / Reassessment X - Simple Wound Assessment / Reassessment - one wound 1 5 []  - Complex Wound Assessment / Reassessment - multiple wounds 0 []  - Dermatologic / Skin Assessment (not related to wound area) 0 ASSESSMENTS - Focused Assessment []  - Circumferential Edema Measurements - multi extremities 0 []  - Nutritional Assessment / Counseling / Intervention 0 []  - Lower Extremity Assessment (monofilament, tuning fork, pulses) 0 []  - Peripheral Arterial Disease Assessment (using hand held doppler) 0 ASSESSMENTS - Ostomy and/or Continence Assessment and Care []  - Incontinence Assessment and Management 0 []  - Ostomy Care Assessment and Management (repouching, etc.) 0 PROCESS - Coordination of Care X - Simple Patient / Family Education for ongoing care 1 15 []  - Complex (extensive) Patient / Family Education for ongoing care 0 []  - Staff obtains ChiropractorConsents, Records, Test Results / Process Orders 0 []  - Staff telephones HHA, Nursing Homes / Clarify orders / etc 0 []  - Routine Transfer to another Facility (non-emergent condition) 0 Captain, Alizon B. ([id number]) []  - Routine Hospital Admission (non-emergent condition) 0 []  - New Admissions / Manufacturing engineernsurance Authorizations / Ordering NPWT, Apligraf, etc. 0 []  - Emergency Hospital Admission (emergent condition) 0 []  - Simple Discharge Coordination 0 []  - Complex (extensive) Discharge Coordination 0 PROCESS - Special Needs []  - Pediatric / Minor Patient Management 0 []  - Isolation Patient Management 0 []  - Hearing / Language / Visual special needs 0 []  - Assessment of Community assistance (transportation, D/C planning, etc.) 0 []  - Additional assistance / Altered mentation 0 []  - Support Surface(s) Assessment (bed, cushion, seat, etc.) 0 INTERVENTIONS - Wound Cleansing / Measurement X - Simple Wound Cleansing - one wound 1 5 []  - Complex  Wound Cleansing - multiple wounds 0 X - Wound Imaging (photographs - any number of wounds) 1 5 []  -  Wound Tracing (instead of photographs) 0 X - Simple Wound Measurement - one wound 1 5 []  - Complex Wound Measurement - multiple wounds 0 INTERVENTIONS - Wound Dressings X - Small Wound Dressing one or multiple wounds 1 10 []  - Medium Wound Dressing one or multiple wounds 0 []  - Large Wound Dressing one or multiple wounds 0 []  - Application of Medications - topical 0 []  - Application of Medications - injection 0 INTERVENTIONS - Miscellaneous []  - External ear exam 0 Norberto, Orvetta B. ([id number]) []  - Specimen Collection (cultures, biopsies, blood, body fluids, etc.) 0 []  - Specimen(s) / Culture(s) sent or taken to Lab for analysis 0 []  - Patient Transfer (multiple staff / Michiel Sites Lift / Similar devices) 0 []  - Simple Staple / Suture removal (25 or less) 0 []  - Complex Staple / Suture removal (26 or more) 0 []  - Hypo / Hyperglycemic Management (close monitor of Blood Glucose) 0 []  - Ankle / Brachial Index (ABI) - do not check if billed separately 0 X - Vital Signs 1 5 Has the patient been seen at the hospital within the last three years: Yes Total Score: 65 Level Of Care: New/Established - Level 2 Electronic Signature(s) Signed: 01/01/2017 4:57:34 PM By: Elpidio Eric BSN, RN Entered By: Elpidio Eric on 01/01/2017 15:14:46 Goin, Larinda Buttery ([id number]) -------------------------------------------------------------------------------- Encounter Discharge Information Details Patient Name: [name], [name] Date of Service: 01/01/2017 3:00 PM Medical Record Number: [id number] Patient Account Number: [id number] Date of Birth/Sex: Dec 30, 1922 (83 y.o. Female) Treating RN: Clover Mealy, RN, BSN, [place] Sink Primary Care Latorya Bautch: Pearson Grippe Other Clinician: Referring Jeda Pardue: Pearson Grippe Treating Jubal Rademaker/Extender: Rudene Re in Treatment: 2 Encounter Discharge Information Items Discharge Pain  Level: 0 Discharge Condition: Unstable Ambulatory Status: Cane Discharge Destination: Home Transportation: Private Auto Accompanied By: CAREGIVER Schedule Follow-up Appointment: No Medication Reconciliation completed and provided to Patient/Care No Henleigh Robello: Provided on Clinical Summary of Care: 01/01/2017 Form Type Recipient Paper Patient CS Electronic Signature(s) Signed: 01/01/2017 4:57:34 PM By: Elpidio Eric BSN, RN Previous Signature: 01/01/2017 3:14:53 PM Version By: Gwenlyn Perking Entered By: Elpidio Eric on 01/01/2017 15:16:51 Panebianco, Shalea BMarland Kitchen ([id number]) -------------------------------------------------------------------------------- Lower Extremity Assessment Details Patient Name: [name], [name] Date of Service: 01/01/2017 3:00 PM Medical Record Number: [id number] Patient Account Number: [id number] Date of Birth/Sex: 1922/09/26 (83 y.o. Female) Treating RN: Afful, RN, BSN, [employer] Primary Care Phillip Sandler: Pearson Grippe Other Clinician: Referring Doniven Vanpatten: Pearson Grippe Treating Drisana Schweickert/Extender: Rudene Re in Treatment: 2 Electronic Signature(s) Signed: 01/01/2017 2:51:43 PM By: Elpidio Eric BSN, RN Entered By: Elpidio Eric on 01/01/2017 14:51:43 Linan, Larinda Buttery ([id number]) -------------------------------------------------------------------------------- Multi Wound Chart Details Patient Name: [name], [name] Date of Service: 01/01/2017 3:00 PM Medical Record Number: [id number] Patient Account Number: [id number] Date of Birth/Sex: Jan 22, 1923 (83 y.o. Female) Treating RN: Clover Mealy, RN, BSN, [place] Sink Primary Care Pa Tennant: Pearson Grippe Other Clinician: Referring Jill Stopka: Pearson Grippe Treating Kei Mcelhiney/Extender: Rudene Re in Treatment: 2 Vital Signs Height(in): 60 Pulse(bpm): 71 Weight(lbs): Blood Pressure 149/78 (mmHg): Body Mass Index(BMI): Temperature(F): Respiratory Rate 16 (breaths/min): Photos: [1:No Photos] [N/A:N/A] Wound Location: [1:Left,  Lateral Upper Arm] [N/A:N/A] Wounding Event: [1:Trauma] [N/A:N/A] Primary Etiology: [1:Skin Tear] [N/A:N/A] Comorbid History: [1:Chronic Obstructive Pulmonary Disease (COPD), Hypertension, Gout, Dementia] [N/A:N/A] Date Acquired: [1:12/13/2016] [N/A:N/A] Weeks of Treatment: [1:2] [N/A:N/A] Wound Status: [1:Open] [N/A:N/A] Measurements L x W x D 2.5x5x0.1 [N/A:N/A] (cm) Area (cm) : [1:9.817] [N/A:N/A] Volume (cm) : [1:0.982] [N/A:N/A] % Reduction in Area: [1:54.70%] [N/A:N/A] % Reduction in Volume: 54.70% [N/A:N/A] Classification: [1:Partial Thickness] [N/A:N/A] Exudate Amount: [1:Medium] [  N/A:N/A] Exudate Type: [1:Serous] [N/A:N/A] Exudate Color: [1:amber] [N/A:N/A] Wound Margin: [1:Flat and Intact] [N/A:N/A] Granulation Amount: [1:Large (67-100%)] [N/A:N/A] Granulation Quality: [1:Red, Pink, Friable] [N/A:N/A] Necrotic Amount: [1:None Present (0%)] [N/A:N/A] Exposed Structures: [1:Fat Layer (Subcutaneous Tissue) Exposed: Yes Fascia: No Tendon: No Muscle: No] [N/A:N/A] Joint: No Bone: No Epithelialization: Medium (34-66%) N/A N/A Periwound Skin Texture: Excoriation: No N/A N/A Induration: No Callus: No Crepitus: No Rash: No Scarring: No Periwound Skin Maceration: No N/A N/A Moisture: Dry/Scaly: No Periwound Skin Color: Atrophie Blanche: No N/A N/A Cyanosis: No Ecchymosis: No Erythema: No Hemosiderin Staining: No Mottled: No Pallor: No Rubor: No Temperature: No Abnormality N/A N/A Tenderness on No N/A N/A Palpation: Wound Preparation: Ulcer Cleansing: N/A N/A Rinsed/Irrigated with Saline Topical Anesthetic Applied: None Treatment Notes Wound #1 (Left, Lateral Upper Arm) 1. Cleansed with: Clean wound with Normal Saline 4. Dressing Applied: Hydrogel Other dressing (specify in notes) 5. Secondary Dressing Applied Dry Gauze Kerlix/Conform Non-Adherent pad 7. Secured with Tape Notes kerlix and coban lightly to secure. Electronic Signature(s) Signed:  01/01/2017 3:36:13 PM By: Evlyn Kanner MD, FACS [place]mill, Larinda Buttery ([id number]) Entered By: Evlyn Kanner on 01/01/2017 15:36:12 Schlatter, Larinda Buttery ([id number]) -------------------------------------------------------------------------------- Multi-Disciplinary Care Plan Details Patient Name: Solberg, Emalie B. Date of Service: 01/01/2017 3:00 PM Medical Record Number: [id number] Patient Account Number: [id number] Date of Birth/Sex: [birth year] (83 y.o. Female) Treating RN: Afful, RN, BSN, [place] Sink Primary Care Delio Slates: Pearson Grippe Other Clinician: Referring Lailie Smead: Pearson Grippe Treating Khylei Wilms/Extender: Rudene Re in Treatment: 2 Active Inactive ` Abuse / Safety / Falls / Self Care Management Nursing Diagnoses: History of Falls Impaired physical mobility Goals: Patient will not experience any injury related to falls Date Initiated: 12/18/2016 Target Resolution Date: 01/15/2017 Goal Status: Active Interventions: Podiatry chair, stretcher in low position and side rails up as needed Provide education on safe transfers Notes: ` Orientation to the Wound Care Program Nursing Diagnoses: Knowledge deficit related to the wound healing center program Goals: Patient/caregiver will verbalize understanding of the Wound Healing Center Program Date Initiated: 12/18/2016 Target Resolution Date: 01/15/2017 Goal Status: Active Interventions: Provide education on orientation to the wound center Notes: ` Wound/Skin Impairment Nursing Diagnoses: MIASHA, EMMONS ([id number]) Impaired tissue integrity Goals: Ulcer/skin breakdown will heal within 14 weeks Date Initiated: 12/18/2016 Target Resolution Date: 01/15/2017 Goal Status: Active Interventions: Assess patient/caregiver ability to obtain necessary supplies Treatment Activities: Skin care regimen initiated : 12/18/2016 Notes: Electronic Signature(s) Signed: 01/01/2017 4:57:34 PM By: Elpidio Eric BSN, RN Entered By: Elpidio Eric on  01/01/2017 15:05:27 Hengel, Latissa B. ([id number]) -------------------------------------------------------------------------------- Pain Assessment Details Patient Name: Mcmenamin, Mikala B. Date of Service: 01/01/2017 3:00 PM Medical Record Number: [id number] Patient Account Number: [id number] Date of Birth/Sex: 07/31/1923 (83 y.o. Female) Treating RN: Clover Mealy, RN, BSN, [place] Sink Primary Care Comer Devins: Pearson Grippe Other Clinician: Referring Josey Dettmann: Pearson Grippe Treating Chery Giusto/Extender: Rudene Re in Treatment: 2 Active Problems Location of Pain Severity and Description of Pain Patient Has Paino No Site Locations With Dressing Change: No Pain Management and Medication Current Pain Management: Electronic Signature(s) Signed: 01/01/2017 2:51:26 PM By: Elpidio Eric BSN, RN Entered By: Elpidio Eric on 01/01/2017 14:51:26 Newlon, Larinda Buttery ([id number]) -------------------------------------------------------------------------------- Patient/Caregiver Education Details Patient Name: Lindsay Iron B. Date of Service: 01/01/2017 3:00 PM Medical Record Number: [id number] Patient Account Number: [id number] Date of Birth/Gender: 18-Jul-1923 (83 y.o. Female) Treating RN: Afful, RN, BSN, [place] Sink Primary Care Physician: Pearson Grippe Other Clinician: Referring Physician: Pearson Grippe Treating Physician/Extender: Rudene Re in Treatment: 2 Education Assessment Education Provided To: Patient  Education Topics Provided Safety: Methods: Explain/Verbal Responses: State content correctly Welcome To The Wound Care Center: Methods: Explain/Verbal Responses: State content correctly Electronic Signature(s) Signed: 01/01/2017 4:57:34 PM By: Elpidio Eric BSN, RN Entered By: Elpidio Eric on 01/01/2017 15:17:05 Hansell, Larinda Buttery ([id number]) -------------------------------------------------------------------------------- Wound Assessment Details Patient Name: [name], [name] Date of Service:  01/01/2017 3:00 PM Medical Record Number: [id number] Patient Account Number: [id number] Date of Birth/Sex: [birth year] (83 y.o. Female) Treating RN: Afful, RN, BSN, [place] Primary Care Tiarah Shisler: Pearson Grippe Other Clinician: Referring Arn Mcomber: Pearson Grippe Treating Francella Barnett/Extender: Rudene Re in Treatment: 2 Wound Status Wound Number: 1 Primary Skin Tear Etiology: Wound Location: Left, Lateral Upper Arm Wound Open Wounding Event: Trauma Status: Date Acquired: 12/13/2016 Comorbid Chronic Obstructive Pulmonary Weeks Of Treatment: 2 History: Disease (COPD), Hypertension, Gout, Clustered Wound: No Dementia Photos Photo Uploaded By: Elpidio Eric on 01/01/2017 15:44:08 Wound Measurements Length: (cm) 2.5 Width: (cm) 5 Depth: (cm) 0.1 Area: (cm) 9.817 Volume: (cm) 0.982 % Reduction in Area: 54.7% % Reduction in Volume: 54.7% Epithelialization: Medium (34-66%) Tunneling: No Wound Description Classification: Partial Thickness Foul Odor Aft Wound Margin: Flat and Intact Slough/Fibrin Exudate Amount: Medium Exudate Type: Serous Exudate Color: amber er Cleansing: No o No Wound Bed Granulation Amount: Large (67-100%) Exposed Structure Granulation Quality: Red, Pink, Friable Fascia Exposed: No Necrotic Amount: None Present (0%) Fat Layer (Subcutaneous Tissue) Exposed: Yes Tendon Exposed: No Palomino, Verlene B. ([id number]) Muscle Exposed: No Joint Exposed: No Bone Exposed: No Periwound Skin Texture Texture Color No Abnormalities Noted: No No Abnormalities Noted: No Callus: No Atrophie Blanche: No Crepitus: No Cyanosis: No Excoriation: No Ecchymosis: No Induration: No Erythema: No Rash: No Hemosiderin Staining: No Scarring: No Mottled: No Pallor: No Moisture Rubor: No No Abnormalities Noted: No Dry / Scaly: No Temperature / Pain Maceration: No Temperature: No Abnormality Wound Preparation Ulcer Cleansing: Rinsed/Irrigated with Saline Topical Anesthetic  Applied: None Treatment Notes Wound #1 (Left, Lateral Upper Arm) 1. Cleansed with: Clean wound with Normal Saline 4. Dressing Applied: Hydrogel Other dressing (specify in notes) 5. Secondary Dressing Applied Dry Gauze Kerlix/Conform Non-Adherent pad 7. Secured with Tape Notes kerlix and coban lightly to secure. Electronic Signature(s) Signed: 01/01/2017 4:57:34 PM By: Elpidio Eric BSN, RN Entered By: Elpidio Eric on 01/01/2017 15:15:04 Ing, Larinda Buttery ([id number]) -------------------------------------------------------------------------------- Vitals Details Patient Name: Lindsay Iron B. Date of Service: 01/01/2017 3:00 PM Medical Record Number: [id number] Patient Account Number: [id number] Date of Birth/Sex: 1923-06-09 (83 y.o. Female) Treating RN: Afful, RN, BSN, [place] Sink Primary Care Raydel Hosick: Pearson Grippe Other Clinician: Referring Tayvia Faughnan: Pearson Grippe Treating Kieley Akter/Extender: Rudene Re in Treatment: 2 Vital Signs Time Taken: 14:56 Pulse (bpm): 71 Height (in): 60 Respiratory Rate (breaths/min): 16 Blood Pressure (mmHg): 149/78 Reference Range: 80 - 120 mg / dl Electronic Signature(s) Signed: 01/01/2017 4:57:34 PM By: Elpidio Eric BSN, RN Entered By: Elpidio Eric on 01/01/2017 14:56:07

## 2017-01-08 ENCOUNTER — Encounter: Payer: Medicare HMO | Admitting: Surgery

## 2017-01-08 DIAGNOSIS — F0390 Unspecified dementia without behavioral disturbance: Secondary | ICD-10-CM | POA: Diagnosis not present

## 2017-01-08 DIAGNOSIS — K219 Gastro-esophageal reflux disease without esophagitis: Secondary | ICD-10-CM | POA: Diagnosis not present

## 2017-01-08 DIAGNOSIS — S41112A Laceration without foreign body of left upper arm, initial encounter: Secondary | ICD-10-CM | POA: Diagnosis not present

## 2017-01-08 DIAGNOSIS — J449 Chronic obstructive pulmonary disease, unspecified: Secondary | ICD-10-CM | POA: Diagnosis not present

## 2017-01-08 DIAGNOSIS — I10 Essential (primary) hypertension: Secondary | ICD-10-CM | POA: Diagnosis not present

## 2017-01-08 DIAGNOSIS — S41102A Unspecified open wound of left upper arm, initial encounter: Secondary | ICD-10-CM | POA: Diagnosis not present

## 2017-01-08 DIAGNOSIS — E440 Moderate protein-calorie malnutrition: Secondary | ICD-10-CM | POA: Diagnosis not present

## 2017-01-08 DIAGNOSIS — M109 Gout, unspecified: Secondary | ICD-10-CM | POA: Diagnosis not present

## 2017-01-08 DIAGNOSIS — M1990 Unspecified osteoarthritis, unspecified site: Secondary | ICD-10-CM | POA: Diagnosis not present

## 2017-01-10 NOTE — Progress Notes (Signed)
EILIYAH, REH ([id number]) Visit Report for 01/08/2017 Chief Complaint Document Details Patient Name: [name], [name] Date of Service: 01/08/2017 3:45 PM Medical Record Number: [id number] Patient Account Number: [id number] Date of Birth/Sex: [birth year] (83 y.o. Female) Treating RN: Clover Mealy, RN, BSN, [place] Sink Primary Care Provider: Pearson Grippe Other Clinician: Referring Provider: Pearson Grippe Treating Provider/Extender: Rudene Re in Treatment: 3 Information Obtained from: Patient Chief Complaint Patient presents to the wound care center for a consult due non healing wound to the left upper arm for about a week Electronic Signature(s) Signed: 01/08/2017 4:12:29 PM By: Evlyn Kanner MD, FACS Entered By: Evlyn Kanner on 01/08/2017 16:12:29 [name], [name] ([id number]) -------------------------------------------------------------------------------- HPI Details Patient Name: [name], [name] Date of Service: 01/08/2017 3:45 PM Medical Record Number: [id number] Patient Account Number: [id number] Date of Birth/Sex: [birth year] (83 y.o. Female) Treating RN: Clover Mealy, RN, BSN, [place] Sink Primary Care Provider: Pearson Grippe Other Clinician: Referring Provider: Pearson Grippe Treating Provider/Extender: Rudene Re in Treatment: 3 History of Present Illness Location: left upper arm Quality: Patient reports experiencing a sharp pain to affected area(s). Severity: Patient states wound are getting worse. Duration: Patient has had the wound for < 1 week prior to presenting for treatment Timing: Pain in wound is Intermittent (comes and goes Context: The wound occurred when the patient had a fall and injured her left upper arm Modifying Factors: Other treatment(s) tried include:went to the ER with Neosporin was applied and a dressing placed Associated Signs and Symptoms: Patient reports having increase discharge. HPI Description: 83 year old patient who was recently seen in the ER for a left arm  abrasion. The patient has dementia and as per the caregivers she sustained a skin tear on her left arm. past medical history significant for arthritis, cough and chronic bronchitis, COPD, hypertension,status post appendectomy, hip fracture surgery, protein calorie malnutrition. the skin tear was dressed with Neosporin and a non-adherent dressing was applied to the arm and wrapped with gauze and was told to see the wound care service Electronic Signature(s) Signed: 01/08/2017 4:12:33 PM By: Evlyn Kanner MD, FACS Entered By: Evlyn Kanner on 01/08/2017 16:12:33 [name] ([id number]) -------------------------------------------------------------------------------- Physical Exam Details Patient Name: [name], [name] Date of Service: 01/08/2017 3:45 PM Medical Record Number: [id number] Patient Account Number: [id number] Date of Birth/Sex: [birth year] (83 y.o. Female) Treating RN: Afful, RN, BSN, [place] Sink Primary Care Provider: Pearson Grippe Other Clinician: Referring Provider: Pearson Grippe Treating Provider/Extender: Rudene Re in Treatment: 3 Constitutional . Pulse regular. Respirations normal and unlabored. Afebrile. . Eyes Nonicteric. Reactive to light. Ears, Nose, Mouth, and Throat Lips, teeth, and gums WNL.Marland Kitchen Moist mucosa without lesions. Neck supple and nontender. No palpable supraclavicular or cervical adenopathy. Normal sized without goiter. Respiratory WNL. No retractions.. Breath sounds WNL, No rubs, rales, rhonchi, or wheeze.. Cardiovascular Heart rhythm and rate regular, no murmur or gallop.. Pedal Pulses WNL. No clubbing, cyanosis or edema. Chest Breasts symmetical and no nipple discharge.. Breast tissue WNL, no masses, lumps, or tenderness.. Lymphatic No adneopathy. No adenopathy. No adenopathy. Musculoskeletal Adexa without tenderness or enlargement.. Digits and nails w/o clubbing, cyanosis, infection, petechiae, ischemia, or inflammatory  conditions.. Integumentary (Hair, Skin) No suspicious lesions. No crepitus or fluctuance. No peri-wound warmth or erythema. No masses.Marland Kitchen Psychiatric Judgement and insight Intact.. No evidence of depression, anxiety, or agitation.. Notes while the dressing was removed there was adherence of the material and this caused some superficial excoriation of the wound. Other than that the wound looks very good Electronic Signature(s) Signed: 01/08/2017 4:13:09 PM By:  Evlyn Kanner MD, FACS Entered By: Evlyn Kanner on 01/08/2017 16:13:08 [name] ([id number]) -------------------------------------------------------------------------------- Physician Orders Details Patient Name: Lindsay Iron B. Date of Service: 01/08/2017 3:45 PM Medical Record Number: [id number] Patient Account Number: [id number] Date of Birth/Sex: [birth year] (83 y.o. Female) Treating RN: Huel Coventry Primary Care Provider: Pearson Grippe Other Clinician: Referring Provider: Pearson Grippe Treating Provider/Extender: Rudene Re in Treatment: 3 Verbal / Phone Orders: No Diagnosis Coding Wound Cleansing Wound #1 Left,Lateral Upper Arm o Clean wound with Normal Saline. - in clinic Anesthetic Wound #1 Left,Lateral Upper Arm o Topical Lidocaine 4% cream applied to wound bed prior to debridement Primary Wound Dressing Wound #1 Left,Lateral Upper Arm o Mepitel One Contact layer Secondary Dressing Wound #1 Left,Lateral Upper Arm o Kerlix and Coban o Foam Dressing Change Frequency Wound #1 Left,Lateral Upper Arm o Change dressing every week Follow-up Appointments Wound #1 Left,Lateral Upper Arm o Return Appointment in 1 week. Electronic Signature(s) Signed: 01/08/2017 4:20:02 PM By: Evlyn Kanner MD, FACS Entered By: Evlyn Kanner on 01/08/2017 16:13:17 [name] ([id number]) -------------------------------------------------------------------------------- Problem List Details Patient Name:  [name], [name] Date of Service: 01/08/2017 3:45 PM Medical Record Number: [id number] Patient Account Number: [id number] Date of Birth/Sex: Feb 17, 1923 (83 y.o. Female) Treating RN: Clover Mealy, RN, BSN, [place] Sink Primary Care Provider: Pearson Grippe Other Clinician: Referring Provider: Pearson Grippe Treating Provider/Extender: Rudene Re in Treatment: 3 Active Problems ICD-10 Encounter Code Description Active Date Diagnosis [phone] Laceration without foreign body of left upper arm, initial 12/18/2016 Yes encounter S41.102A Unspecified open wound of left upper arm, initial 12/18/2016 Yes encounter E44.0 Moderate protein-calorie malnutrition 12/18/2016 Yes Inactive Problems Resolved Problems Electronic Signature(s) Signed: 01/08/2017 4:12:19 PM By: Evlyn Kanner MD, FACS Entered By: Evlyn Kanner on 01/08/2017 16:12:19 [name], [name] ([id number]) -------------------------------------------------------------------------------- Progress Note Details Patient Name: [name], [name] Date of Service: 01/08/2017 3:45 PM Medical Record Number: [id number] Patient Account Number: [id number] Date of Birth/Sex: 1923/01/07 (83 y.o. Female) Treating RN: Afful, RN, BSN, [place] Sink Primary Care Provider: Pearson Grippe Other Clinician: Referring Provider: Pearson Grippe Treating Provider/Extender: Rudene Re in Treatment: 3 Subjective Chief Complaint Information obtained from Patient Patient presents to the wound care center for a consult due non healing wound to the left upper arm for about a week History of Present Illness (HPI) The following HPI elements were documented for the patient's wound: Location: left upper arm Quality: Patient reports experiencing a sharp pain to affected area(s). Severity: Patient states wound are getting worse. Duration: Patient has had the wound for < 1 week prior to presenting for treatment Timing: Pain in wound is Intermittent (comes and goes Context: The wound occurred  when the patient had a fall and injured her left upper arm Modifying Factors: Other treatment(s) tried include:went to the ER with Neosporin was applied and a dressing placed Associated Signs and Symptoms: Patient reports having increase discharge. 83 year old patient who was recently seen in the ER for a left arm abrasion. The patient has dementia and as per the caregivers she sustained a skin tear on her left arm. past medical history significant for arthritis, cough and chronic bronchitis, COPD, hypertension,status post appendectomy, hip fracture surgery, protein calorie malnutrition. the skin tear was dressed with Neosporin and a non-adherent dressing was applied to the arm and wrapped with gauze and was told to see the wound care service Objective Constitutional Pulse regular. Respirations normal and unlabored. Afebrile. Vitals Time Taken: 3:51 PM, Height: 60 in, Pulse: 66 bpm, Respiratory Rate: 16 breaths/min, Blood Pressure:  123/71 mmHg. [name], [name] ([id number]) Eyes Nonicteric. Reactive to light. Ears, Nose, Mouth, and Throat Lips, teeth, and gums WNL.Marland Kitchen. Moist mucosa without lesions. Neck supple and nontender. No palpable supraclavicular or cervical adenopathy. Normal sized without goiter. Respiratory WNL. No retractions.. Breath sounds WNL, No rubs, rales, rhonchi, or wheeze.. Cardiovascular Heart rhythm and rate regular, no murmur or gallop.. Pedal Pulses WNL. No clubbing, cyanosis or edema. Chest Breasts symmetical and no nipple discharge.. Breast tissue WNL, no masses, lumps, or tenderness.. Lymphatic No adneopathy. No adenopathy. No adenopathy. Musculoskeletal Adexa without tenderness or enlargement.. Digits and nails w/o clubbing, cyanosis, infection, petechiae, ischemia, or inflammatory conditions.Marland Kitchen. Psychiatric Judgement and insight Intact.. No evidence of depression, anxiety, or agitation.. General Notes: while the dressing was removed there was adherence of  the material and this caused some superficial excoriation of the wound. Other than that the wound looks very good Integumentary (Hair, Skin) No suspicious lesions. No crepitus or fluctuance. No peri-wound warmth or erythema. No masses.. Wound #1 status is Open. Original cause of wound was Trauma. The wound is located on the Left,Lateral Upper Arm. The wound measures 2.2cm length x 4.5cm width x 0.1cm depth; 7.775cm^2 area and 0.778cm^3 volume. There is Fat Layer (Subcutaneous Tissue) Exposed exposed. There is no tunneling or undermining noted. There is a medium amount of serous drainage noted. The wound margin is flat and intact. There is large (67-100%) red, pink, friable granulation within the wound bed. There is no necrotic tissue within the wound bed. The periwound skin appearance did not exhibit: Callus, Crepitus, Excoriation, Induration, Rash, Scarring, Dry/Scaly, Maceration, Atrophie Blanche, Cyanosis, Ecchymosis, Hemosiderin Staining, Mottled, Pallor, Rubor, Erythema. Periwound temperature was noted as No Abnormality. Assessment [name], [name] ([id number]) Active Problems ICD-10 S41.112A - Laceration without foreign body of left upper arm, initial encounter S41.102A - Unspecified open wound of left upper arm, initial encounter E44.0 - Moderate protein-calorie malnutrition Plan Wound Cleansing: Wound #1 Left,Lateral Upper Arm: Clean wound with Normal Saline. - in clinic Anesthetic: Wound #1 Left,Lateral Upper Arm: Topical Lidocaine 4% cream applied to wound bed prior to debridement Primary Wound Dressing: Wound #1 Left,Lateral Upper Arm: Mepitel One Contact layer Secondary Dressing: Wound #1 Left,Lateral Upper Arm: Kerlix and Coban Foam Dressing Change Frequency: Wound #1 Left,Lateral Upper Arm: Change dressing every week Follow-up Appointments: Wound #1 Left,Lateral Upper Arm: Return Appointment in 1 week. She has made an excellent recovery the week before but today  on removal of the dressing with some of the supple scar has excoriatedand and at this stage I have recommended Mepitel, allewyn foam, a few pieces of gauze and a light Kerlix and Coban dressing to be left intact for the week. I anticipate discharge soon Electronic Signature(s) Signed: 01/08/2017 4:14:34 PM By: Evlyn KannerBritto, Belicia Difatta MD, FACS [name], [name] ([id number]) Entered By: Evlyn KannerBritto, Briceson Broadwater on 01/08/2017 16:14:34 [name], [name] ([id number]) -------------------------------------------------------------------------------- SuperBill Details Patient Name: Landry, Jasminne B. Date of Service: 01/08/2017 Medical Record Number: [id number] Patient Account Number: [id number] Date of Birth/Sex: 04/22/1923 (83 y.o. Female) Treating RN: Afful, RN, BSN, [place] Primary Care Provider: [name], [name] Other Clinician: Referring Provider: [name], [name] Treating Provider/Extender: Rudene ReBritto, Jovahn Breit Weeks in Treatment: 3 Diagnosis Coding ICD-10 Codes Code Description [id number] Laceration without foreign body of left upper arm, initial encounter S41.102A Unspecified open wound of left upper arm, initial encounter E44.0 Moderate protein-calorie malnutrition Facility Procedures CPT4 Code: [id number] Description: [id number] - WOUND CARE VISIT-LEV 2 EST PT Modifier: Quantity: 1 Physician Procedures CPT4 Code Description: [id number] - WC PHYS  LEVEL 3 - EST PT ICD-10 Description Diagnosis S41.112A Laceration without foreign body of left upper arm, E44.0 Moderate protein-calorie malnutrition S41.102A Unspecified open wound of left upper arm,  initial e Modifier: initial encount ncounter Quantity: 1 er Psychologist, [facility]) Signed: 01/08/2017 4:14:52 PM By: Evlyn Kanner MD, FACS Entered By: Evlyn Kanner on 01/08/2017 16:14:51

## 2017-01-10 NOTE — Progress Notes (Signed)
[name], [name] ([id number]) Visit Report for 01/08/2017 Arrival Information Details Patient Name: [name], [name] Date of Service: 01/08/2017 3:45 PM Medical Record Number: [id number] Patient Account Number: [id number] Date of Birth/Sex: Oct 23, 1922 (83 y.o. Female) Treating RN: Huel Coventry Primary Care Yader Criger: Pearson Grippe Other Clinician: Referring Shailey Butterbaugh: Pearson Grippe Treating Rola Lennon/Extender: Rudene Re in Treatment: 3 Visit Information History Since Last Visit Added or deleted any medications: No Patient Arrived: Ambulatory Any new allergies or adverse reactions: No Arrival Time: 15:50 Had a fall or experienced change in No Accompanied By: caregiver activities of daily living that may affect Transfer Assistance: None risk of falls: Patient Identification Verified: Yes Signs or symptoms of abuse/neglect since last No Secondary Verification Process Yes visito Completed: Hospitalized since last visit: No Patient Has Alerts: No Has Dressing in Place as Prescribed: Yes Pain Present Now: No Electronic Signature(s) Signed: 01/09/2017 9:42:09 AM By: Elliot Gurney, BSN, RN, CWS, Kim RN, BSN Entered By: Elliot Gurney, BSN, RN, CWS, Kim on 01/08/2017 15:50:35 [name], [name] ([id number]) -------------------------------------------------------------------------------- Clinic Level of Care Assessment Details Patient Name: [name], [name] Date of Service: 01/08/2017 3:45 PM Medical Record Number: [id number] Patient Account Number: [id number] Date of Birth/Sex: 09-30-1922 (83 y.o. Female) Treating RN: Huel Coventry Primary Care Serin Thornell: Pearson Grippe Other Clinician: Referring Mikell Camp: Pearson Grippe Treating Junie Avilla/Extender: Rudene Re in Treatment: 3 Clinic Level of Care Assessment Items TOOL 4 Quantity Score []  - Use when only an EandM is performed on FOLLOW-UP visit 0 ASSESSMENTS - Nursing Assessment / Reassessment []  - Reassessment of Co-morbidities (includes updates in  patient status) 0 X - Reassessment of Adherence to Treatment Plan 1 5 ASSESSMENTS - Wound and Skin Assessment / Reassessment X - Simple Wound Assessment / Reassessment - one wound 1 5 []  - Complex Wound Assessment / Reassessment - multiple wounds 0 []  - Dermatologic / Skin Assessment (not related to wound area) 0 ASSESSMENTS - Focused Assessment []  - Circumferential Edema Measurements - multi extremities 0 []  - Nutritional Assessment / Counseling / Intervention 0 []  - Lower Extremity Assessment (monofilament, tuning fork, pulses) 0 []  - Peripheral Arterial Disease Assessment (using hand held doppler) 0 ASSESSMENTS - Ostomy and/or Continence Assessment and Care []  - Incontinence Assessment and Management 0 []  - Ostomy Care Assessment and Management (repouching, etc.) 0 PROCESS - Coordination of Care X - Simple Patient / Family Education for ongoing care 1 15 []  - Complex (extensive) Patient / Family Education for ongoing care 0 []  - Staff obtains Chiropractor, Records, Test Results / Process Orders 0 []  - Staff telephones HHA, Nursing Homes / Clarify orders / etc 0 []  - Routine Transfer to another Facility (non-emergent condition) 0 [name], [name] ([id number]) []  - Routine Hospital Admission (non-emergent condition) 0 []  - New Admissions / Manufacturing engineer / Ordering NPWT, Apligraf, etc. 0 []  - Emergency Hospital Admission (emergent condition) 0 X - Simple Discharge Coordination 1 10 []  - Complex (extensive) Discharge Coordination 0 PROCESS - Special Needs []  - Pediatric / Minor Patient Management 0 []  - Isolation Patient Management 0 []  - Hearing / Language / Visual special needs 0 []  - Assessment of Community assistance (transportation, D/C planning, etc.) 0 []  - Additional assistance / Altered mentation 0 []  - Support Surface(s) Assessment (bed, cushion, seat, etc.) 0 INTERVENTIONS - Wound Cleansing / Measurement X - Simple Wound Cleansing - one wound 1 5 []  - Complex  Wound Cleansing - multiple wounds 0 X - Wound Imaging (photographs - any number of wounds) 1 5 []  -  Wound Tracing (instead of photographs) 0 X - Simple Wound Measurement - one wound 1 5 []  - Complex Wound Measurement - multiple wounds 0 INTERVENTIONS - Wound Dressings X - Small Wound Dressing one or multiple wounds 1 10 []  - Medium Wound Dressing one or multiple wounds 0 []  - Large Wound Dressing one or multiple wounds 0 []  - Application of Medications - topical 0 []  - Application of Medications - injection 0 INTERVENTIONS - Miscellaneous []  - External ear exam 0 [name], [name] ([id number]) []  - Specimen Collection (cultures, biopsies, blood, body fluids, etc.) 0 []  - Specimen(s) / Culture(s) sent or taken to Lab for analysis 0 []  - Patient Transfer (multiple staff / Michiel SitesHoyer Lift / Similar devices) 0 []  - Simple Staple / Suture removal (25 or less) 0 []  - Complex Staple / Suture removal (26 or more) 0 []  - Hypo / Hyperglycemic Management (close monitor of Blood Glucose) 0 []  - Ankle / Brachial Index (ABI) - do not check if billed separately 0 X - Vital Signs 1 5 Has the patient been seen at the hospital within the last three years: Yes Total Score: 65 Level Of Care: New/Established - Level 2 Electronic Signature(s) Signed: 01/09/2017 9:42:09 AM By: Elliot GurneyWoody, BSN, RN, CWS, Kim RN, BSN Entered By: Elliot GurneyWoody, BSN, RN, CWS, Kim on 01/08/2017 16:11:32 [name], [name] ([id number]) -------------------------------------------------------------------------------- Encounter Discharge Information Details Patient Name: [name], [name] Date of Service: 01/08/2017 3:45 PM Medical Record Number: [id number] Patient Account Number: [id number] Date of Birth/Sex: 01/22/1923 (83 y.o. Female) Treating RN: Huel CoventryWoody, Kim Primary Care Sruti Ayllon: [name], [name] Other Clinician: Referring Lemarcus Baggerly: [name], [name] Treating Jamileth Putzier/Extender: Rudene ReBritto, Errol Weeks in Treatment: 3 Encounter Discharge Information  Items Discharge Pain Level: 0 Discharge Condition: Stable Ambulatory Status: Ambulatory Discharge Destination: Home Transportation: Private Auto Accompanied By: caregiver Schedule Follow-up Appointment: Yes Medication Reconciliation completed and provided to Patient/Care Yes Ayva Veilleux: Provided on Clinical Summary of Care: 01/08/2017 Form Type Recipient Paper Patient CS Electronic Signature(s) Signed: 01/08/2017 4:15:21 PM By: Gwenlyn PerkingMoore, Shelia Entered By: Gwenlyn PerkingMoore, Shelia on 01/08/2017 16:15:21 [name], [name] ([id number]) -------------------------------------------------------------------------------- Lower Extremity Assessment Details Patient Name: [name], [name] Date of Service: 01/08/2017 3:45 PM Medical Record Number: [id number] Patient Account Number: [id number] Date of Birth/Sex: 12/30/1922 (83 y.o. Female) Treating RN: Huel CoventryWoody, Kim Primary Care Kamika Goodloe: [name], [name] Other Clinician: Referring Manisha Cancel: [name], [name] Treating Keagen Heinlen/Extender: Rudene ReBritto, Errol Weeks in Treatment: 3 Electronic Signature(s) Signed: 01/09/2017 9:42:09 AM By: Elliot GurneyWoody, BSN, RN, CWS, Kim RN, BSN Entered By: Elliot GurneyWoody, BSN, RN, CWS, Kim on 01/08/2017 15:58:11 [name], [name] ([id number]) -------------------------------------------------------------------------------- Multi Wound Chart Details Patient Name: [name], [name] Date of Service: 01/08/2017 3:45 PM Medical Record Number: [id number] Patient Account Number: [id number] Date of Birth/Sex: 08/15/1923 (83 y.o. Female) Treating RN: Huel CoventryWoody, Kim Primary Care Jurnei Latini: [name], [name] Other Clinician: Referring Braylon Lemmons: [name], [name] Treating Luigi Stuckey/Extender: Rudene ReBritto, Errol Weeks in Treatment: 3 Vital Signs Height(in): 60 Pulse(bpm): 66 Weight(lbs): Blood Pressure 123/71 (mmHg): Body Mass Index(BMI): Temperature(F): Respiratory Rate 16 (breaths/min): Photos: [N/A:N/A] Wound Location: Left Upper Arm - Lateral N/A N/A Wounding Event: Trauma N/A  N/A Primary Etiology: Skin Tear N/A N/A Comorbid History: Chronic Obstructive N/A N/A Pulmonary Disease (COPD), Hypertension, Gout, Dementia Date Acquired: 12/13/2016 N/A N/A Weeks of Treatment: 3 N/A N/A Wound Status: Open N/A N/A Measurements L x W x D 2.2x4.5x0.1 N/A N/A (cm) Area (cm) : 7.775 N/A N/A Volume (cm) : 0.778 N/A N/A % Reduction in Area: 64.10% N/A N/A % Reduction in Volume: 64.10% N/A N/A Classification:  Partial Thickness N/A N/A Exudate Amount: Medium N/A N/A Exudate Type: Serous N/A N/A Exudate Color: amber N/A N/A Wound Margin: Flat and Intact N/A N/A Granulation Amount: Large (67-100%) N/A N/A Granulation Quality: Red, Pink, Friable N/A N/A Necrotic Amount: None Present (0%) N/A N/A Tomeo, Clemence B. ([id number]) Exposed Structures: Fat Layer (Subcutaneous N/A N/A Tissue) Exposed: Yes Fascia: No Tendon: No Muscle: No Joint: No Bone: No Epithelialization: Medium (34-66%) N/A N/A Periwound Skin Texture: Excoriation: No N/A N/A Induration: No Callus: No Crepitus: No Rash: No Scarring: No Periwound Skin Maceration: No N/A N/A Moisture: Dry/Scaly: No Periwound Skin Color: Atrophie Blanche: No N/A N/A Cyanosis: No Ecchymosis: No Erythema: No Hemosiderin Staining: No Mottled: No Pallor: No Rubor: No Temperature: No Abnormality N/A N/A Tenderness on No N/A N/A Palpation: Wound Preparation: Ulcer Cleansing: N/A N/A Rinsed/Irrigated with Saline Topical Anesthetic Applied: None Treatment Notes Wound #1 (Left, Lateral Upper Arm) 1. Cleansed with: Clean wound with Normal Saline 2. Anesthetic Topical Lidocaine 4% cream to wound bed prior to debridement 4. Dressing Applied: Mepitel 5. Secondary Dressing Applied Foam Notes kerlix and coban lightly to secure. MADILYNE, TADLOCK ([id number]) Electronic Signature(s) Signed: 01/08/2017 4:12:24 PM By: Evlyn Kanner MD, FACS Entered By: Evlyn Kanner on 01/08/2017 16:12:23 Raetz, [name]  ([id number]) -------------------------------------------------------------------------------- Multi-Disciplinary Care Plan Details Patient Name: [name], [name] Date of Service: 01/08/2017 3:45 PM Medical Record Number: [id number] Patient Account Number: [id number] Date of Birth/Sex: 05/26/1923 (83 y.o. Female) Treating RN: Huel Coventry Primary Care Marianita Botkin: Pearson Grippe Other Clinician: Referring Aulani Shipton: Pearson Grippe Treating Deionna Marcantonio/Extender: Rudene Re in Treatment: 3 Active Inactive ` Abuse / Safety / Falls / Self Care Management Nursing Diagnoses: History of Falls Impaired physical mobility Goals: Patient will not experience any injury related to falls Date Initiated: 12/18/2016 Target Resolution Date: 01/15/2017 Goal Status: Active Interventions: Podiatry chair, stretcher in low position and side rails up as needed Provide education on safe transfers Notes: ` Orientation to the Wound Care Program Nursing Diagnoses: Knowledge deficit related to the wound healing center program Goals: Patient/caregiver will verbalize understanding of the Wound Healing Center Program Date Initiated: 12/18/2016 Target Resolution Date: 01/15/2017 Goal Status: Active Interventions: Provide education on orientation to the wound center Notes: ` Wound/Skin Impairment Nursing Diagnoses: ELSE, HABERMANN ([id number]) Impaired tissue integrity Goals: Ulcer/skin breakdown will heal within 14 weeks Date Initiated: 12/18/2016 Target Resolution Date: 01/15/2017 Goal Status: Active Interventions: Assess patient/caregiver ability to obtain necessary supplies Treatment Activities: Skin care regimen initiated : 12/18/2016 Notes: Electronic Signature(s) Signed: 01/09/2017 9:42:09 AM By: Elliot Gurney, BSN, RN, CWS, Kim RN, BSN Entered By: Elliot Gurney, BSN, RN, CWS, Kim on 01/08/2017 16:01:20 [name], [name] ([id number]) -------------------------------------------------------------------------------- Pain  Assessment Details Patient Name: [name], Shalicia B. Date of Service: 01/08/2017 3:45 PM Medical Record Number: [id number] Patient Account Number: [id number] Date of Birth/Sex: July 28, 1923 (83 y.o. Female) Treating RN: Huel Coventry Primary Care Satara Virella: Pearson Grippe Other Clinician: Referring Mireyah Chervenak: Pearson Grippe Treating Traevon Meiring/Extender: Rudene Re in Treatment: 3 Active Problems Location of Pain Severity and Description of Pain Patient Has Paino No Site Locations With Dressing Change: No Pain Management and Medication Current Pain Management: Goals for Pain Management Topical or injectable lidocaine is offered to patient for acute pain when surgical debridement is performed. If needed, Patient is instructed to use over the counter pain medication for the following 24-48 hours after debridement. Wound care MDs do not prescribed pain medications. Patient has chronic pain or uncontrolled pain. Patient has been instructed to make an appointment with  their Primary Care Physician for pain management. Electronic Signature(s) Signed: 01/09/2017 9:42:09 AM By: Elliot Gurney, BSN, RN, CWS, Kim RN, BSN Entered By: Elliot Gurney, BSN, RN, CWS, Kim on 01/08/2017 15:50:58 Follett, [name] ([id number]) -------------------------------------------------------------------------------- Patient/Caregiver Education Details Patient Name: [name] Date of Service: 01/08/2017 3:45 PM Medical Record Number: [id number] Patient Account Number: [id number] Date of Birth/Gender: 09/14/1922 (83 y.o. Female) Treating RN: Huel Coventry Primary Care Physician: Pearson Grippe Other Clinician: Referring Physician: Pearson Grippe Treating Physician/Extender: Rudene Re in Treatment: 3 Education Assessment Education Provided To: Caregiver Education Topics Provided Wound/Skin Impairment: Handouts: Caring for Your Ulcer, Other: leave dressing in place Methods: Demonstration Responses: State content  correctly Electronic Signature(s) Signed: 01/09/2017 9:42:09 AM By: Elliot Gurney, BSN, RN, CWS, Kim RN, BSN Entered By: Elliot Gurney, BSN, RN, CWS, Kim on 01/08/2017 16:12:48 [place], [name] ([id number]) -------------------------------------------------------------------------------- Wound Assessment Details Patient Name: Rought, Angelice B. Date of Service: 01/08/2017 3:45 PM Medical Record Number: [id number] Patient Account Number: [id number] Date of Birth/Sex: 02/11/1923 (83 y.o. Female) Treating RN: Huel Coventry Primary Care Cayne Yom: Pearson Grippe Other Clinician: Referring Chanda Laperle: Pearson Grippe Treating Shell Blanchette/Extender: Rudene Re in Treatment: 3 Wound Status Wound Number: 1 Primary Skin Tear Etiology: Wound Location: Left Upper Arm - Lateral Wound Open Wounding Event: Trauma Status: Date Acquired: 12/13/2016 Comorbid Chronic Obstructive Pulmonary Weeks Of Treatment: 3 History: Disease (COPD), Hypertension, Gout, Clustered Wound: No Dementia Photos Photo Uploaded By: Elliot Gurney, BSN, RN, CWS, Kim on 01/08/2017 15:59:53 Wound Measurements Length: (cm) 2.2 Width: (cm) 4.5 Depth: (cm) 0.1 Area: (cm) 7.775 Volume: (cm) 0.778 % Reduction in Area: 64.1% % Reduction in Volume: 64.1% Epithelialization: Medium (34-66%) Tunneling: No Undermining: No Wound Description Classification: Partial Thickness Wound Margin: Flat and Intact Exudate Amount: Medium Exudate Type: Serous Exudate Color: amber Foul Odor After Cleansing: No Slough/Fibrino No Wound Bed Granulation Amount: Large (67-100%) Exposed Structure Granulation Quality: Red, Pink, Friable Fascia Exposed: No Necrotic Amount: None Present (0%) Fat Layer (Subcutaneous Tissue) Exposed: Yes Tendon Exposed: No Muscle Exposed: No Joint Exposed: No Wuebker, Lateasha B. ([id number]) Bone Exposed: No Periwound Skin Texture Texture Color No Abnormalities Noted: No No Abnormalities Noted: No Callus: No Atrophie Blanche:  No Crepitus: No Cyanosis: No Excoriation: No Ecchymosis: No Induration: No Erythema: No Rash: No Hemosiderin Staining: No Scarring: No Mottled: No Pallor: No Moisture Rubor: No No Abnormalities Noted: No Dry / Scaly: No Temperature / Pain Maceration: No Temperature: No Abnormality Wound Preparation Ulcer Cleansing: Rinsed/Irrigated with Saline Topical Anesthetic Applied: None Treatment Notes Wound #1 (Left, Lateral Upper Arm) 1. Cleansed with: Clean wound with Normal Saline 2. Anesthetic Topical Lidocaine 4% cream to wound bed prior to debridement 4. Dressing Applied: Mepitel 5. Secondary Dressing Applied Foam Notes kerlix and coban lightly to secure. Electronic Signature(s) Signed: 01/09/2017 9:42:09 AM By: Elliot Gurney, BSN, RN, CWS, Kim RN, BSN Entered By: Elliot Gurney, BSN, RN, CWS, Kim on 01/08/2017 15:57:09 [place], [name] ([id number]) -------------------------------------------------------------------------------- Vitals Details Patient Name: Burges, Itzamara B. Date of Service: 01/08/2017 3:45 PM Medical Record Number: [id number] Patient Account Number: [id number] Date of Birth/Sex: 01-20-1923 (83 y.o. Female) Treating RN: Huel Coventry Primary Care Stevi Hollinshead: Pearson Grippe Other Clinician: Referring Zakari Couchman: Pearson Grippe Treating An Schnabel/Extender: Rudene Re in Treatment: 3 Vital Signs Time Taken: 15:51 Pulse (bpm): 66 Height (in): 60 Respiratory Rate (breaths/min): 16 Blood Pressure (mmHg): 123/71 Reference Range: 80 - 120 mg / dl Electronic Signature(s) Signed: 01/09/2017 9:42:09 AM By: Elliot Gurney, BSN, RN, CWS, Kim RN, BSN Entered By: Elliot Gurney, BSN, RN, CWS, Kim on 01/08/2017  15:51:16 

## 2017-01-15 ENCOUNTER — Encounter: Payer: Medicare HMO | Admitting: Surgery

## 2017-01-15 DIAGNOSIS — J449 Chronic obstructive pulmonary disease, unspecified: Secondary | ICD-10-CM | POA: Diagnosis not present

## 2017-01-15 DIAGNOSIS — F0390 Unspecified dementia without behavioral disturbance: Secondary | ICD-10-CM | POA: Diagnosis not present

## 2017-01-15 DIAGNOSIS — S41112A Laceration without foreign body of left upper arm, initial encounter: Secondary | ICD-10-CM | POA: Diagnosis not present

## 2017-01-15 DIAGNOSIS — K219 Gastro-esophageal reflux disease without esophagitis: Secondary | ICD-10-CM | POA: Diagnosis not present

## 2017-01-15 DIAGNOSIS — M1990 Unspecified osteoarthritis, unspecified site: Secondary | ICD-10-CM | POA: Diagnosis not present

## 2017-01-15 DIAGNOSIS — S41102A Unspecified open wound of left upper arm, initial encounter: Secondary | ICD-10-CM | POA: Diagnosis not present

## 2017-01-15 DIAGNOSIS — E440 Moderate protein-calorie malnutrition: Secondary | ICD-10-CM | POA: Diagnosis not present

## 2017-01-15 DIAGNOSIS — M109 Gout, unspecified: Secondary | ICD-10-CM | POA: Diagnosis not present

## 2017-01-15 DIAGNOSIS — I10 Essential (primary) hypertension: Secondary | ICD-10-CM | POA: Diagnosis not present

## 2017-01-16 NOTE — Progress Notes (Signed)
[name], [name] ([id number]) Visit Report for 01/15/2017 Arrival Information Details Patient Name: [name], [name] Date of Service: 01/15/2017 2:30 PM Medical Record Number: [id number] Patient Account Number: [id number] Date of Birth/Sex: 01/16/1923 (83 y.o. Female) Treating RN: Ashok CordiaPinkerton, Debi Primary Care Annalea Alguire: [name], [name] Other Clinician: Referring Aamilah Augenstein: [name], [name] Treating Ilian Wessell/Extender: Rudene ReBritto, Errol Weeks in Treatment: 4 Visit Information History Since Last Visit All ordered tests and consults were completed: No Patient Arrived: Ambulatory Added or deleted any medications: No Arrival Time: 14:32 Any new allergies or adverse reactions: No Accompanied By: caregiver Had a fall or experienced change in No Transfer Assistance: EasyPivot activities of daily living that may affect Patient Lift risk of falls: Patient Identification Verified: Yes Signs or symptoms of abuse/neglect since last No Secondary Verification Process Yes visito Completed: Hospitalized since last visit: No Patient Requires Transmission- No Has Dressing in Place as Prescribed: Yes Based Precautions: Pain Present Now: No Patient Has Alerts: No Electronic Signature(s) Signed: 01/15/2017 4:16:11 PM By: Alejandro MullingPinkerton, Debra Entered By: Alejandro MullingPinkerton, Debra on 01/15/2017 14:34:13 Ebbs, Glendi BMarland Kitchen. ([id number]) -------------------------------------------------------------------------------- Clinic Level of Care Assessment Details Patient Name: [name], [name] Date of Service: 01/15/2017 2:30 PM Medical Record Number: [id number] Patient Account Number: [id number] Date of Birth/Sex: 02/21/1923 (83 y.o. Female) Treating RN: Ashok CordiaPinkerton, Debi Primary Care [name]: [name], [name] Other Clinician: Referring Eular Panek: [name], [name] Treating Emri Sample/Extender: Rudene ReBritto, Errol Weeks in Treatment: 4 Clinic Level of Care Assessment Items TOOL 4 Quantity Score X - Use when only an EandM is performed on FOLLOW-UP visit 1  0 ASSESSMENTS - Nursing Assessment / Reassessment X - Reassessment of Co-morbidities (includes updates in patient status) 1 10 X - Reassessment of Adherence to Treatment Plan 1 5 ASSESSMENTS - Wound and Skin Assessment / Reassessment X - Simple Wound Assessment / Reassessment - one wound 1 5 []  - Complex Wound Assessment / Reassessment - multiple wounds 0 []  - Dermatologic / Skin Assessment (not related to wound area) 0 ASSESSMENTS - Focused Assessment []  - Circumferential Edema Measurements - multi extremities 0 []  - Nutritional Assessment / Counseling / Intervention 0 []  - Lower Extremity Assessment (monofilament, tuning fork, pulses) 0 []  - Peripheral Arterial Disease Assessment (using hand held doppler) 0 ASSESSMENTS - Ostomy and/or Continence Assessment and Care []  - Incontinence Assessment and Management 0 []  - Ostomy Care Assessment and Management (repouching, etc.) 0 PROCESS - Coordination of Care []  - Simple Patient / Family Education for ongoing care 0 X - Complex (extensive) Patient / Family Education for ongoing care 1 20 X - Staff obtains ChiropractorConsents, Records, Test Results / Process Orders 1 10 X - Staff telephones HHA, Nursing Homes / Clarify orders / etc 1 10 []  - Routine Transfer to another Facility (non-emergent condition) 0 Dysert, [name] ([id number]) []  - Routine Hospital Admission (non-emergent condition) 0 []  - New Admissions / Manufacturing engineernsurance Authorizations / Ordering NPWT, Apligraf, etc. 0 []  - Emergency Hospital Admission (emergent condition) 0 X - Simple Discharge Coordination 1 10 []  - Complex (extensive) Discharge Coordination 0 PROCESS - Special Needs []  - Pediatric / Minor Patient Management 0 []  - Isolation Patient Management 0 []  - Hearing / Language / Visual special needs 0 []  - Assessment of Community assistance (transportation, D/C planning, etc.) 0 []  - Additional assistance / Altered mentation 0 []  - Support Surface(s) Assessment (bed, cushion, seat,  etc.) 0 INTERVENTIONS - Wound Cleansing / Measurement X - Simple Wound Cleansing - one wound 1 5 []  - Complex Wound Cleansing - multiple wounds 0 X -  Wound Imaging (photographs - any number of wounds) 1 5 []  - Wound Tracing (instead of photographs) 0 X - Simple Wound Measurement - one wound 1 5 []  - Complex Wound Measurement - multiple wounds 0 INTERVENTIONS - Wound Dressings X - Small Wound Dressing one or multiple wounds 1 10 []  - Medium Wound Dressing one or multiple wounds 0 []  - Large Wound Dressing one or multiple wounds 0 X - Application of Medications - topical 1 5 []  - Application of Medications - injection 0 INTERVENTIONS - Miscellaneous []  - External ear exam 0 [name], [name] ([id number]) []  - Specimen Collection (cultures, biopsies, blood, body fluids, etc.) 0 []  - Specimen(s) / Culture(s) sent or taken to Lab for analysis 0 []  - Patient Transfer (multiple staff / Michiel Sites Lift / Similar devices) 0 []  - Simple Staple / Suture removal (25 or less) 0 []  - Complex Staple / Suture removal (26 or more) 0 []  - Hypo / Hyperglycemic Management (close monitor of Blood Glucose) 0 []  - Ankle / Brachial Index (ABI) - do not check if billed separately 0 X - Vital Signs 1 5 Has the patient been seen at the hospital within the last three years: Yes Total Score: 105 Level Of Care: New/Established - Level 3 Electronic Signature(s) Signed: 01/15/2017 4:16:11 PM By: Alejandro Mulling Entered By: Alejandro Mulling on 01/15/2017 15:06:35 Pletz, Larinda Buttery ([id number]) -------------------------------------------------------------------------------- Encounter Discharge Information Details Patient Name: [name], [name] Date of Service: 01/15/2017 2:30 PM Medical Record Number: [id number] Patient Account Number: [id number] Date of Birth/Sex: 1923/01/02 (83 y.o. Female) Treating RN: Phillis Haggis Primary Care Shayon Trompeter: Pearson Grippe Other Clinician: Referring Chrisanne [name]: Pearson Grippe Treating  Royer Cristobal/Extender: Rudene Re in Treatment: 4 Encounter Discharge Information Items Discharge Pain Level: 0 Discharge Condition: Stable Ambulatory Status: Ambulatory Discharge Destination: Nursing Home Transportation: Private Auto Accompanied By: caregiver Schedule Follow-up Appointment: Yes Medication Reconciliation completed and provided to Patient/Care No Fraida Veldman: Provided on Clinical Summary of Care: 01/15/2017 Form Type Recipient Paper Patient CS Electronic Signature(s) Signed: 01/15/2017 2:55:35 PM By: Gwenlyn Perking Entered By: Gwenlyn Perking on 01/15/2017 14:55:35 Gamarra, Larinda Buttery ([id number]) -------------------------------------------------------------------------------- Lower Extremity Assessment Details Patient Name: [name], [name] Date of Service: 01/15/2017 2:30 PM Medical Record Number: [id number] Patient Account Number: [id number] Date of Birth/Sex: [birth year] (83 y.o. Female) Treating RN: Ashok Cordia, Debi Primary Care Leydi Winstead: Pearson Grippe Other Clinician: Referring Laker Thompson: Pearson Grippe Treating Yarielis Funaro/Extender: Rudene Re in Treatment: 4 Electronic Signature(s) Signed: 01/15/2017 4:16:11 PM By: Alejandro Mulling Entered By: Alejandro Mulling on 01/15/2017 14:43:00 Tenpas, Larinda Buttery ([id number]) -------------------------------------------------------------------------------- Multi Wound Chart Details Patient Name: [name], [name] Date of Service: 01/15/2017 2:30 PM Medical Record Number: [id number] Patient Account Number: [id number] Date of Birth/Sex: 08/07/1923 (83 y.o. Female) Treating RN: Phillis Haggis Primary Care Malakhi Markwood: Pearson Grippe Other Clinician: Referring Leane Loring: Pearson Grippe Treating Mairead Schwarzkopf/Extender: Rudene Re in Treatment: 4 Vital Signs Height(in): 60 Pulse(bpm): 65 Weight(lbs): Blood Pressure 97/56 (mmHg): Body Mass Index(BMI): Temperature(F): Respiratory Rate 16 (breaths/min): Photos: [1:No Photos]  [N/A:N/A] Wound Location: [1:Left Upper Arm - Lateral] [N/A:N/A] Wounding Event: [1:Trauma] [N/A:N/A] Primary Etiology: [1:Skin Tear] [N/A:N/A] Comorbid History: [1:Chronic Obstructive Pulmonary Disease (COPD), Hypertension, Gout, Dementia] [N/A:N/A] Date Acquired: [1:12/13/2016] [N/A:N/A] Weeks of Treatment: [1:4] [N/A:N/A] Wound Status: [1:Open] [N/A:N/A] Measurements L x W x D 2.2x4x0.1 [N/A:N/A] (cm) Area (cm) : [1:6.912] [N/A:N/A] Volume (cm) : [1:0.691] [N/A:N/A] % Reduction in Area: [1:68.10%] [N/A:N/A] % Reduction in Volume: 68.10% [N/A:N/A] Classification: [1:Partial Thickness] [N/A:N/A] Exudate Amount: [1:Medium] [N/A:N/A] Exudate Type: [1:Serous] [N/A:N/A] Exudate  Color: [1:amber] [N/A:N/A] Wound Margin: [1:Flat and Intact] [N/A:N/A] Granulation Amount: [1:Large (67-100%)] [N/A:N/A] Granulation Quality: [1:Red, Pink, Friable] [N/A:N/A] Necrotic Amount: [1:None Present (0%)] [N/A:N/A] Exposed Structures: [1:Fat Layer (Subcutaneous Tissue) Exposed: Yes Fascia: No Tendon: No Muscle: No] [N/A:N/A] Joint: No Bone: No Epithelialization: Medium (34-66%) N/A N/A Periwound Skin Texture: Excoriation: No N/A N/A Induration: No Callus: No Crepitus: No Rash: No Scarring: No Periwound Skin Maceration: No N/A N/A Moisture: Dry/Scaly: No Periwound Skin Color: Atrophie Blanche: No N/A N/A Cyanosis: No Ecchymosis: No Erythema: No Hemosiderin Staining: No Mottled: No Pallor: No Rubor: No Temperature: No Abnormality N/A N/A Tenderness on No N/A N/A Palpation: Wound Preparation: Ulcer Cleansing: N/A N/A Rinsed/Irrigated with Saline Topical Anesthetic Applied: None Treatment Notes Wound #1 (Left, Lateral Upper Arm) 1. Cleansed with: Clean wound with Normal Saline 4. Dressing Applied: Other dressing (specify in notes) 5. Secondary Dressing Applied Foam Kerlix/Conform 7. Secured with Tape Notes kerlix and coban lightly to secure, mepitel, sorbact Electronic  Signature(s) Signed: 01/15/2017 2:52:12 PM By: Evlyn Kanner MD, FACS Entered By: Evlyn Kanner on 01/15/2017 14:52:12 Popiel, Larinda Buttery ([id number]) Hildenbrand, Larinda Buttery ([id number]) -------------------------------------------------------------------------------- Multi-Disciplinary Care Plan Details Patient Name: Turrell, Sahira B. Date of Service: 01/15/2017 2:30 PM Medical Record Number: [id number] Patient Account Number: [id number] Date of Birth/Sex: 1923-01-06 (83 y.o. Female) Treating RN: Ashok Cordia, Debi Primary Care Jazzmyne Rasnick: Pearson Grippe Other Clinician: Referring Zaccai Chavarin: Pearson Grippe Treating Endrit Gittins/Extender: Rudene Re in Treatment: 4 Active Inactive ` Abuse / Safety / Falls / Self Care Management Nursing Diagnoses: History of Falls Impaired physical mobility Goals: Patient will not experience any injury related to falls Date Initiated: 12/18/2016 Target Resolution Date: 01/15/2017 Goal Status: Active Interventions: Podiatry chair, stretcher in low position and side rails up as needed Provide education on safe transfers Notes: ` Orientation to the Wound Care Program Nursing Diagnoses: Knowledge deficit related to the wound healing center program Goals: Patient/caregiver will verbalize understanding of the Wound Healing Center Program Date Initiated: 12/18/2016 Target Resolution Date: 01/15/2017 Goal Status: Active Interventions: Provide education on orientation to the wound center Notes: ` Wound/Skin Impairment Nursing Diagnoses: KIELYN, KARDELL ([id number]) Impaired tissue integrity Goals: Ulcer/skin breakdown will heal within 14 weeks Date Initiated: 12/18/2016 Target Resolution Date: 01/15/2017 Goal Status: Active Interventions: Assess patient/caregiver ability to obtain necessary supplies Treatment Activities: Skin care regimen initiated : 12/18/2016 Notes: Electronic Signature(s) Signed: 01/15/2017 4:16:11 PM By: Alejandro Mulling Entered By:  Alejandro Mulling on 01/15/2017 14:43:06 Cowles, Natalina B. ([id number]) -------------------------------------------------------------------------------- Pain Assessment Details Patient Name: Dassow, [name] Date of Service: 01/15/2017 2:30 PM Medical Record Number: [id number] Patient Account Number: [id number] Date of Birth/Sex: Sep 16, 1922 (83 y.o. Female) Treating RN: Phillis Haggis Primary Care Melessa Cowell: Pearson Grippe Other Clinician: Referring Cambria Osten: Pearson Grippe Treating Jaiven Graveline/Extender: Rudene Re in Treatment: 4 Active Problems Location of Pain Severity and Description of Pain Patient Has Paino No Site Locations With Dressing Change: No Pain Management and Medication Current Pain Management: Electronic Signature(s) Signed: 01/15/2017 4:16:11 PM By: Alejandro Mulling Entered By: Alejandro Mulling on 01/15/2017 14:34:21 Donaho, Larinda Buttery ([id number]) -------------------------------------------------------------------------------- Patient/Caregiver Education Details Patient Name: Lindsay Iron B. Date of Service: 01/15/2017 2:30 PM Medical Record Number: [id number] Patient Account Number: [id number] Date of Birth/Gender: July 15, 1923 (83 y.o. Female) Treating RN: Phillis Haggis Primary Care Physician: Pearson Grippe Other Clinician: Referring Physician: Pearson Grippe Treating Physician/Extender: Rudene Re in Treatment: 4 Education Assessment Education Provided To: Patient Education Topics Provided Wound/Skin Impairment: Handouts: Other: change dressing as ordered Methods: Demonstration, Explain/Verbal Responses: State content correctly  Electronic Signature(s) Signed: 01/15/2017 4:16:11 PM By: Alejandro Mulling Entered By: Alejandro Mulling on 01/15/2017 14:45:57 Cillo, Larinda Buttery ([id number]) -------------------------------------------------------------------------------- Wound Assessment Details Patient Name: [name], [name] Date of Service: 01/15/2017  2:30 PM Medical Record Number: [id number] Patient Account Number: [id number] Date of Birth/Sex: 07/06/1923 (83 y.o. Female) Treating RN: Ashok Cordia, Debi Primary Care Tyrease Vandeberg: Pearson Grippe Other Clinician: Referring Leiam Hopwood: Pearson Grippe Treating Mohab Ashby/Extender: Rudene Re in Treatment: 4 Wound Status Wound Number: 1 Primary Skin Tear Etiology: Wound Location: Left Upper Arm - Lateral Wound Open Wounding Event: Trauma Status: Date Acquired: 12/13/2016 Comorbid Chronic Obstructive Pulmonary Weeks Of Treatment: 4 History: Disease (COPD), Hypertension, Gout, Clustered Wound: No Dementia Photos Photo Uploaded By: Alejandro Mulling on 01/15/2017 15:04:21 Wound Measurements Length: (cm) 2.2 Width: (cm) 4 Depth: (cm) 0.1 Area: (cm) 6.912 Volume: (cm) 0.691 % Reduction in Area: 68.1% % Reduction in Volume: 68.1% Epithelialization: Medium (34-66%) Tunneling: No Undermining: No Wound Description Classification: Partial Thickness Wound Margin: Flat and Intact Exudate Amount: Medium Exudate Type: Serous Exudate Color: amber Foul Odor After Cleansing: No Slough/Fibrino No Wound Bed Granulation Amount: Large (67-100%) Exposed Structure Granulation Quality: Red, Pink, Friable Fascia Exposed: No Necrotic Amount: None Present (0%) Fat Layer (Subcutaneous Tissue) Exposed: Yes Tendon Exposed: No Schafer, Tannah B. ([id number]) Muscle Exposed: No Joint Exposed: No Bone Exposed: No Periwound Skin Texture Texture Color No Abnormalities Noted: No No Abnormalities Noted: No Callus: No Atrophie Blanche: No Crepitus: No Cyanosis: No Excoriation: No Ecchymosis: No Induration: No Erythema: No Rash: No Hemosiderin Staining: No Scarring: No Mottled: No Pallor: No Moisture Rubor: No No Abnormalities Noted: No Dry / Scaly: No Temperature / Pain Maceration: No Temperature: No Abnormality Wound Preparation Ulcer Cleansing: Rinsed/Irrigated with Saline Topical  Anesthetic Applied: None Treatment Notes Wound #1 (Left, Lateral Upper Arm) 1. Cleansed with: Clean wound with Normal Saline 4. Dressing Applied: Other dressing (specify in notes) 5. Secondary Dressing Applied Foam Kerlix/Conform 7. Secured with Tape Notes kerlix and coban lightly to secure, mepitel, sorbact Electronic Signature(s) Signed: 01/15/2017 4:16:11 PM By: Alejandro Mulling Entered By: Alejandro Mulling on 01/15/2017 14:42:45 Julian, Larinda Buttery ([id number]) -------------------------------------------------------------------------------- Vitals Details Patient Name: Coran, Lindsay Kanner B. Date of Service: 01/15/2017 2:30 PM Medical Record Number: [id number] Patient Account Number: [id number] Date of Birth/Sex: 12/21/1922 (83 y.o. Female) Treating RN: Phillis Haggis Primary Care Nitzia Perren: Pearson Grippe Other Clinician: Referring [place] Lapaglia: Pearson Grippe Treating Anisah Kuck/Extender: Rudene Re in Treatment: 4 Vital Signs Time Taken: 14:36 Pulse (bpm): 65 Height (in): 60 Respiratory Rate (breaths/min): 16 Blood Pressure (mmHg): 97/56 Reference Range: 80 - 120 mg / dl Electronic Signature(s) Signed: 01/15/2017 4:16:11 PM By: Alejandro Mulling Entered By: Alejandro Mulling on 01/15/2017 14:36:41

## 2017-01-17 NOTE — Progress Notes (Signed)
[name], Rashunda B. ([id number]) Visit Report for 01/15/2017 Chief Complaint Document Details Patient Name: [name], [name] Date of Service: 01/15/2017 2:30 PM Medical Record Number: [id number] Patient Account Number: [id number] Date of Birth/Sex: 10/11/1922 (83 y.o. Female) Treating RN: Ashok CordiaPinkerton, Debi Primary Care Provider: [name], [name] Other Clinician: Referring Provider: [name], [name] Treating Provider/Extender: Rudene ReBritto, Lashonda Sonneborn Weeks in Treatment: 4 Information Obtained from: Patient Chief Complaint Patient presents to the wound care center for a consult due non healing wound to the left upper arm for about a week Electronic Signature(s) Signed: 01/15/2017 2:52:19 PM By: Evlyn KannerBritto, Havier Deeb MD, FACS Entered By: Evlyn KannerBritto, Quinlyn Tep on 01/15/2017 14:52:18 Pell, Marciana B. ([id number]) -------------------------------------------------------------------------------- HPI Details Patient Name: [name], [name] Date of Service: 01/15/2017 2:30 PM Medical Record Number: [id number] Patient Account Number: [id number] Date of Birth/Sex: 06/24/1923 (83 y.o. Female) Treating RN: Phillis HaggisPinkerton, Debi Primary Care Provider: [name], [name] Other Clinician: Referring Provider: [name], [name] Treating Provider/Extender: Rudene ReBritto, Delorice Bannister Weeks in Treatment: 4 History of Present Illness Location: left upper arm Quality: Patient reports experiencing a sharp pain to affected area(s). Severity: Patient states wound are getting worse. Duration: Patient has had the wound for < 1 week prior to presenting for treatment Timing: Pain in wound is Intermittent (comes and goes Context: The wound occurred when the patient had a fall and injured her left upper arm Modifying Factors: Other treatment(s) tried include:went to the ER with Neosporin was applied and a dressing placed Associated Signs and Symptoms: Patient reports having increase discharge. HPI Description: 83 year old patient who was recently seen in the ER for a left arm abrasion.  The patient has dementia and as per the caregivers she sustained a skin tear on her left arm. past medical history significant for arthritis, cough and chronic bronchitis, COPD, hypertension,status post appendectomy, hip fracture surgery, protein calorie malnutrition. the skin tear was dressed with Neosporin and a non-adherent dressing was applied to the arm and wrapped with gauze and was told to see the wound care service Electronic Signature(s) Signed: 01/15/2017 2:52:23 PM By: Evlyn KannerBritto, Doy Taaffe MD, FACS Entered By: Evlyn KannerBritto, Saquoia Sianez on 01/15/2017 14:52:22 Soave, Larinda ButteryALLIE B. ([id number]) -------------------------------------------------------------------------------- Physical Exam Details Patient Name: [name], [name] Date of Service: 01/15/2017 2:30 PM Medical Record Number: [id number] Patient Account Number: [id number] Date of Birth/Sex: 08/23/1922 (83 y.o. Female) Treating RN: Phillis HaggisPinkerton, Debi Primary Care Provider: [name], [name] Other Clinician: Referring Provider: [name], [name] Treating Provider/Extender: Rudene ReBritto, Burns Timson Weeks in Treatment: 4 Constitutional . Pulse regular. Respirations normal and unlabored. Afebrile. . Eyes Nonicteric. Reactive to light. Ears, Nose, Mouth, and Throat Lips, teeth, and gums WNL.Marland Kitchen. Moist mucosa without lesions. Neck supple and nontender. No palpable supraclavicular or cervical adenopathy. Normal sized without goiter. Respiratory WNL. No retractions.. Cardiovascular Pedal Pulses WNL. No clubbing, cyanosis or edema. Lymphatic No adneopathy. No adenopathy. No adenopathy. Musculoskeletal Adexa without tenderness or enlargement.. Digits and nails w/o clubbing, cyanosis, infection, petechiae, ischemia, or inflammatory conditions.. Integumentary (Hair, Skin) No suspicious lesions. No crepitus or fluctuance. No peri-wound warmth or erythema. No masses.Marland Kitchen. Psychiatric Judgement and insight Intact.. No evidence of depression, anxiety, or agitation.. Notes the wound  is looking much better today and that is healthy-appearing station from the edges. Electronic Signature(s) Signed: 01/15/2017 2:53:00 PM By: Evlyn KannerBritto, Calab Sachse MD, FACS Entered By: Evlyn KannerBritto, Phillp Dolores on 01/15/2017 14:53:00 Marvin, Larinda ButteryALLIE B. ([id number]) -------------------------------------------------------------------------------- Physician Orders Details Patient Name: [name], [name] Date of Service: 01/15/2017 2:30 PM Medical Record Number: [id number] Patient Account Number: [id number] Date of Birth/Sex: 11/22/1922 (83 y.o. Female) Treating RN: Phillis HaggisPinkerton, Debi Primary Care Provider:  Pearson Grippe Other Clinician: Referring Provider: Pearson Grippe Treating Provider/Extender: Rudene Re in Treatment: 4 Verbal / Phone Orders: Yes Clinician: Pinkerton, Debi Read Back and Verified: Yes Diagnosis Coding Wound Cleansing Wound #1 Left,Lateral Upper Arm o Clean wound with Normal Saline. - in clinic Anesthetic Wound #1 Left,Lateral Upper Arm o Topical Lidocaine 4% cream applied to wound bed prior to debridement Primary Wound Dressing Wound #1 Left,Lateral Upper Arm o Mepitel One Contact layer o Other: - sorbact Secondary Dressing Wound #1 Left,Lateral Upper Arm o Kerlix and Coban o Foam Dressing Change Frequency Wound #1 Left,Lateral Upper Arm o Change dressing every week Follow-up Appointments Wound #1 Left,Lateral Upper Arm o Return Appointment in 1 week. Electronic Signature(s) Signed: 01/15/2017 4:16:11 PM By: Alejandro Mulling Signed: 01/15/2017 4:34:04 PM By: Evlyn Kanner MD, FACS Entered By: Alejandro Mulling on 01/15/2017 14:46:59 Rodger, Larinda Buttery ([id number]) -------------------------------------------------------------------------------- Problem List Details Patient Name: Drummonds, Cathlene B. Date of Service: 01/15/2017 2:30 PM Medical Record Number: [id number] Patient Account Number: [id number] Date of Birth/Sex: [birth year] (83 y.o. Female) Treating RN:  Ashok Cordia, Debi Primary Care Provider: Pearson Grippe Other Clinician: Referring Provider: Pearson Grippe Treating Provider/Extender: Rudene Re in Treatment: 4 Active Problems ICD-10 Encounter Code Description Active Date Diagnosis S41.112A Laceration without foreign body of left upper arm, initial 12/18/2016 Yes encounter S41.102A Unspecified open wound of left upper arm, initial 12/18/2016 Yes encounter E44.0 Moderate protein-calorie malnutrition 12/18/2016 Yes Inactive Problems Resolved Problems Electronic Signature(s) Signed: 01/15/2017 2:52:07 PM By: Evlyn Kanner MD, FACS Entered By: Evlyn Kanner on 01/15/2017 14:52:06 Vessels, Timmia B. ([id number]) -------------------------------------------------------------------------------- Progress Note Details Patient Name: Auxier, Kalley B. Date of Service: 01/15/2017 2:30 PM Medical Record Number: [id number] Patient Account Number: [id number] Date of Birth/Sex: 1922-09-26 (83 y.o. Female) Treating RN: Phillis Haggis Primary Care Provider: Pearson Grippe Other Clinician: Referring Provider: Pearson Grippe Treating Provider/Extender: Rudene Re in Treatment: 4 Subjective Chief Complaint Information obtained from Patient Patient presents to the wound care center for a consult due non healing wound to the left upper arm for about a week History of Present Illness (HPI) The following HPI elements were documented for the patient's wound: Location: left upper arm Quality: Patient reports experiencing a sharp pain to affected area(s). Severity: Patient states wound are getting worse. Duration: Patient has had the wound for < 1 week prior to presenting for treatment Timing: Pain in wound is Intermittent (comes and goes Context: The wound occurred when the patient had a fall and injured her left upper arm Modifying Factors: Other treatment(s) tried include:went to the ER with Neosporin was applied and a dressing placed Associated  Signs and Symptoms: Patient reports having increase discharge. 83 year old patient who was recently seen in the ER for a left arm abrasion. The patient has dementia and as per the caregivers she sustained a skin tear on her left arm. past medical history significant for arthritis, cough and chronic bronchitis, COPD, hypertension,status post appendectomy, hip fracture surgery, protein calorie malnutrition. the skin tear was dressed with Neosporin and a non-adherent dressing was applied to the arm and wrapped with gauze and was told to see the wound care service Objective Constitutional Pulse regular. Respirations normal and unlabored. Afebrile. Vitals Time Taken: 2:36 PM, Height: 60 in, Pulse: 65 bpm, Respiratory Rate: 16 breaths/min, Blood Pressure: 97/56 mmHg. Corlew, Kimberlly B. ([id number]) Eyes Nonicteric. Reactive to light. Ears, Nose, Mouth, and Throat Lips, teeth, and gums WNL.Marland Kitchen Moist mucosa without lesions. Neck supple and nontender. No palpable supraclavicular or cervical adenopathy. Normal sized  without goiter. Respiratory WNL. No retractions.. Cardiovascular Pedal Pulses WNL. No clubbing, cyanosis or edema. Lymphatic No adneopathy. No adenopathy. No adenopathy. Musculoskeletal Adexa without tenderness or enlargement.. Digits and nails w/o clubbing, cyanosis, infection, petechiae, ischemia, or inflammatory conditions.Marland Kitchen Psychiatric Judgement and insight Intact.. No evidence of depression, anxiety, or agitation.. General Notes: the wound is looking much better today and that is healthy-appearing station from the edges. Integumentary (Hair, Skin) No suspicious lesions. No crepitus or fluctuance. No peri-wound warmth or erythema. No masses.. Wound #1 status is Open. Original cause of wound was Trauma. The wound is located on the Left,Lateral Upper Arm. The wound measures 2.2cm length x 4cm width x 0.1cm depth; 6.912cm^2 area and 0.691cm^3 volume. There is Fat Layer  (Subcutaneous Tissue) Exposed exposed. There is no tunneling or undermining noted. There is a medium amount of serous drainage noted. The wound margin is flat and intact. There is large (67-100%) red, pink, friable granulation within the wound bed. There is no necrotic tissue within the wound bed. The periwound skin appearance did not exhibit: Callus, Crepitus, Excoriation, Induration, Rash, Scarring, Dry/Scaly, Maceration, Atrophie Blanche, Cyanosis, Ecchymosis, Hemosiderin Staining, Mottled, Pallor, Rubor, Erythema. Periwound temperature was noted as No Abnormality. Assessment Active Problems ICD-10 S41.112A - Laceration without foreign body of left upper arm, initial encounter [place], Tacori B. ([id number]) S41.102A - Unspecified open wound of left upper arm, initial encounter E44.0 - Moderate protein-calorie malnutrition Plan Wound Cleansing: Wound #1 Left,Lateral Upper Arm: Clean wound with Normal Saline. - in clinic Anesthetic: Wound #1 Left,Lateral Upper Arm: Topical Lidocaine 4% cream applied to wound bed prior to debridement Primary Wound Dressing: Wound #1 Left,Lateral Upper Arm: Mepitel One Contact layer Other: - sorbact Secondary Dressing: Wound #1 Left,Lateral Upper Arm: Kerlix and Coban Foam Dressing Change Frequency: Wound #1 Left,Lateral Upper Arm: Change dressing every week Follow-up Appointments: Wound #1 Left,Lateral Upper Arm: Return Appointment in 1 week. She looks much better today and at this stage I have recommended Mepitel, Sorbact, a few pieces of gauze and a light Kerlix and Coban dressing to be left intact for the week. I anticipate discharge soon Electronic Signature(s) Signed: 01/15/2017 2:54:01 PM By: Evlyn Kanner MD, FACS Entered By: Evlyn Kanner on 01/15/2017 14:54:00 Bahl, Larinda Buttery ([id number]) Jessop, Larinda Buttery ([id number]) -------------------------------------------------------------------------------- SuperBill Details Patient Name:  Kopischke, Birgitta B. Date of Service: 01/15/2017 Medical Record Number: [id number] Patient Account Number: [id number] Date of Birth/Sex: April 03, 1923 (83 y.o. Female) Treating RN: Ashok Cordia, Debi Primary Care Provider: Pearson Grippe Other Clinician: Referring Provider: Pearson Grippe Treating Provider/Extender: Rudene Re in Treatment: 4 Diagnosis Coding ICD-10 Codes Code Description [phone] Laceration without foreign body of left upper arm, initial encounter S41.102A Unspecified open wound of left upper arm, initial encounter E44.0 Moderate protein-calorie malnutrition Facility Procedures CPT4 Code: [id number] Description: 99213 - WOUND CARE VISIT-LEV 3 EST PT Modifier: Quantity: 1 Physician Procedures CPT4 Code Description: [id number] - WC PHYS LEVEL 3 - EST PT ICD-10 Description Diagnosis S41.112A Laceration without foreign body of left upper arm, S41.102A Unspecified open wound of left upper arm, initial e E44.0 Moderate protein-calorie  malnutrition Modifier: initial encount ncounter Quantity: 1 er Psychologist, [facility]) Signed: 01/15/2017 4:16:11 PM By: Alejandro Mulling Signed: 01/15/2017 4:34:04 PM By: Evlyn Kanner MD, FACS Previous Signature: 01/15/2017 2:55:02 PM Version By: Evlyn Kanner MD, FACS Entered By: Alejandro Mulling on 01/15/2017 15:06:46

## 2017-01-22 ENCOUNTER — Ambulatory Visit: Payer: Medicare HMO | Admitting: Surgery

## 2017-01-23 ENCOUNTER — Encounter: Payer: Medicare HMO | Attending: Surgery | Admitting: Surgery

## 2017-01-23 DIAGNOSIS — J449 Chronic obstructive pulmonary disease, unspecified: Secondary | ICD-10-CM | POA: Diagnosis not present

## 2017-01-23 DIAGNOSIS — S41112A Laceration without foreign body of left upper arm, initial encounter: Secondary | ICD-10-CM | POA: Diagnosis not present

## 2017-01-23 DIAGNOSIS — F0390 Unspecified dementia without behavioral disturbance: Secondary | ICD-10-CM | POA: Insufficient documentation

## 2017-01-23 DIAGNOSIS — M109 Gout, unspecified: Secondary | ICD-10-CM | POA: Diagnosis not present

## 2017-01-23 DIAGNOSIS — E440 Moderate protein-calorie malnutrition: Secondary | ICD-10-CM | POA: Insufficient documentation

## 2017-01-23 DIAGNOSIS — J8410 Pulmonary fibrosis, unspecified: Secondary | ICD-10-CM | POA: Insufficient documentation

## 2017-01-23 DIAGNOSIS — X58XXXA Exposure to other specified factors, initial encounter: Secondary | ICD-10-CM | POA: Insufficient documentation

## 2017-01-23 DIAGNOSIS — M1990 Unspecified osteoarthritis, unspecified site: Secondary | ICD-10-CM | POA: Insufficient documentation

## 2017-01-23 DIAGNOSIS — S41102A Unspecified open wound of left upper arm, initial encounter: Secondary | ICD-10-CM | POA: Insufficient documentation

## 2017-01-23 DIAGNOSIS — K219 Gastro-esophageal reflux disease without esophagitis: Secondary | ICD-10-CM | POA: Insufficient documentation

## 2017-01-23 DIAGNOSIS — I10 Essential (primary) hypertension: Secondary | ICD-10-CM | POA: Diagnosis not present

## 2017-01-24 NOTE — Progress Notes (Signed)
[name], [name] ([id number]) Visit Report for 01/23/2017 Arrival Information Details Patient Name: [name], [name] Date of Service: 01/23/2017 10:15 AM Medical Record Number: [id number] Patient Account Number: [id number] Date of Birth/Sex: 1922/10/02 (83 y.o. Female) Treating RN: Curtis Sites Primary Care Tonnya Garbett: Pearson Grippe Other Clinician: Referring Aidynn Polendo: Pearson Grippe Treating Danie Hannig/Extender: Rudene Re in Treatment: 5 Visit Information History Since Last Visit Added or deleted any medications: No Patient Arrived: Ambulatory Any new allergies or adverse reactions: No Arrival Time: 10:27 Had a fall or experienced change in No Accompanied By: cg activities of daily living that may affect Transfer Assistance: Manual risk of falls: Patient Identification Verified: Yes Signs or symptoms of abuse/neglect since last No Secondary Verification Process Yes visito Completed: Hospitalized since last visit: No Patient Requires Transmission-Based No Has Dressing in Place as Prescribed: Yes Precautions: Pain Present Now: No Patient Has Alerts: No Electronic Signature(s) Signed: 01/23/2017 11:31:51 AM By: Curtis Sites Entered By: Curtis Sites on 01/23/2017 10:31:01 Carias, [name] ([id number]) -------------------------------------------------------------------------------- Clinic Level of Care Assessment Details Patient Name: [name], [name] Date of Service: 01/23/2017 10:15 AM Medical Record Number: [id number] Patient Account Number: [id number] Date of Birth/Sex: May 01, 1923 (83 y.o. Female) Treating RN: Curtis Sites Primary Care Audiel Scheiber: Pearson Grippe Other Clinician: Referring Mercedes Valeriano: Pearson Grippe Treating Elroy Schembri/Extender: Rudene Re in Treatment: 5 Clinic Level of Care Assessment Items TOOL 4 Quantity Score []  - Use when only an EandM is performed on FOLLOW-UP visit 0 ASSESSMENTS - Nursing Assessment / Reassessment X - Reassessment of  Co-morbidities (includes updates in patient status) 1 10 X - Reassessment of Adherence to Treatment Plan 1 5 ASSESSMENTS - Wound and Skin Assessment / Reassessment X - Simple Wound Assessment / Reassessment - one wound 1 5 []  - Complex Wound Assessment / Reassessment - multiple wounds 0 []  - Dermatologic / Skin Assessment (not related to wound area) 0 ASSESSMENTS - Focused Assessment []  - Circumferential Edema Measurements - multi extremities 0 []  - Nutritional Assessment / Counseling / Intervention 0 []  - Lower Extremity Assessment (monofilament, tuning fork, pulses) 0 []  - Peripheral Arterial Disease Assessment (using hand held doppler) 0 ASSESSMENTS - Ostomy and/or Continence Assessment and Care []  - Incontinence Assessment and Management 0 []  - Ostomy Care Assessment and Management (repouching, etc.) 0 PROCESS - Coordination of Care X - Simple Patient / Family Education for ongoing care 1 15 []  - Complex (extensive) Patient / Family Education for ongoing care 0 []  - Staff obtains Chiropractor, Records, Test Results / Process Orders 0 []  - Staff telephones HHA, Nursing Homes / Clarify orders / etc 0 []  - Routine Transfer to another Facility (non-emergent condition) 0 [name], [name] ([id number]) []  - Routine Hospital Admission (non-emergent condition) 0 []  - New Admissions / Manufacturing engineer / Ordering NPWT, Apligraf, etc. 0 []  - Emergency Hospital Admission (emergent condition) 0 X - Simple Discharge Coordination 1 10 []  - Complex (extensive) Discharge Coordination 0 PROCESS - Special Needs []  - Pediatric / Minor Patient Management 0 []  - Isolation Patient Management 0 []  - Hearing / Language / Visual special needs 0 []  - Assessment of Community assistance (transportation, D/C planning, etc.) 0 []  - Additional assistance / Altered mentation 0 []  - Support Surface(s) Assessment (bed, cushion, seat, etc.) 0 INTERVENTIONS - Wound Cleansing / Measurement X - Simple Wound  Cleansing - one wound 1 5 []  - Complex Wound Cleansing - multiple wounds 0 X - Wound Imaging (photographs - any number of wounds) 1 5 []  - Wound  Tracing (instead of photographs) 0 X - Simple Wound Measurement - one wound 1 5 []  - Complex Wound Measurement - multiple wounds 0 INTERVENTIONS - Wound Dressings X - Small Wound Dressing one or multiple wounds 1 10 []  - Medium Wound Dressing one or multiple wounds 0 []  - Large Wound Dressing one or multiple wounds 0 []  - Application of Medications - topical 0 []  - Application of Medications - injection 0 INTERVENTIONS - Miscellaneous []  - External ear exam 0 [name], [name] ([id number]) []  - Specimen Collection (cultures, biopsies, blood, body fluids, etc.) 0 []  - Specimen(s) / Culture(s) sent or taken to Lab for analysis 0 []  - Patient Transfer (multiple staff / Michiel SitesHoyer Lift / Similar devices) 0 []  - Simple Staple / Suture removal (25 or less) 0 []  - Complex Staple / Suture removal (26 or more) 0 []  - Hypo / Hyperglycemic Management (close monitor of Blood Glucose) 0 []  - Ankle / Brachial Index (ABI) - do not check if billed separately 0 X - Vital Signs 1 5 Has the patient been seen at the hospital within the last three years: Yes Total Score: 75 Level Of Care: New/Established - Level 2 Electronic Signature(s) Signed: 01/23/2017 11:31:51 AM By: Curtis Sitesorthy, Joanna Entered By: Curtis Sitesorthy, Joanna on 01/23/2017 11:19:34 [name], [name] ([id number]) -------------------------------------------------------------------------------- Encounter Discharge Information Details Patient Name: [name], [name] Date of Service: 01/23/2017 10:15 AM Medical Record Number: [id number] Patient Account Number: [id number] Date of Birth/Sex: 04/25/1923 (83 y.o. Female) Treating RN: Curtis Sitesorthy, Joanna Primary Care Lakeeta Dobosz: [name], [name] Other Clinician: Referring Reilynn Lauro: [name], [name] Treating Nadelyn Enriques/Extender: Rudene ReBritto, Errol Weeks in Treatment: 5 Encounter Discharge  Information Items Discharge Pain Level: 0 Discharge Condition: Stable Ambulatory Status: Ambulatory Discharge Destination: Home Transportation: Private Auto Accompanied By: Lindsay Ironcg Schedule Follow-up Appointment: Yes Medication Reconciliation completed and provided to Patient/Care No Nayden Czajka: Provided on Clinical Summary of Care: 01/23/2017 Form Type Recipient Paper Patient CS Electronic Signature(s) Signed: 01/23/2017 11:07:28 AM By: Francie MassingKelly, Tia Entered By: Francie MassingKelly, Tia on 01/23/2017 11:07:28 Jolin, Silvia B. ([id number]) -------------------------------------------------------------------------------- Multi Wound Chart Details Patient Name: [name], [name] Date of Service: 01/23/2017 10:15 AM Medical Record Number: [id number] Patient Account Number: [id number] Date of Birth/Sex: 11/21/1922 (83 y.o. Female) Treating RN: Curtis Sitesorthy, Joanna Primary Care Yandel Zeiner: [name], [name] Other Clinician: Referring Klaira Pesci: [name], [name] Treating Fajr Fife/Extender: Rudene ReBritto, Errol Weeks in Treatment: 5 Vital Signs Height(in): 60 Pulse(bpm): 67 Weight(lbs): Blood Pressure 121/70 (mmHg): Body Mass Index(BMI): Temperature(F): 97.6 Respiratory Rate 16 (breaths/min): Photos: [N/A:N/A] Wound Location: Left Upper Arm - Lateral N/A N/A Wounding Event: Trauma N/A N/A Primary Etiology: Skin Tear N/A N/A Comorbid History: Chronic Obstructive N/A N/A Pulmonary Disease (COPD), Hypertension, Gout, Dementia Date Acquired: 12/13/2016 N/A N/A Weeks of Treatment: 5 N/A N/A Wound Status: Open N/A N/A Measurements L x W x D 2.3x3.6x0.1 N/A N/A (cm) Area (cm) : 6.503 N/A N/A Volume (cm) : 0.65 N/A N/A % Reduction in Area: 70.00% N/A N/A % Reduction in Volume: 70.00% N/A N/A Classification: Partial Thickness N/A N/A Exudate Amount: Medium N/A N/A Exudate Type: Serous N/A N/A Exudate Color: amber N/A N/A Wound Margin: Flat and Intact N/A N/A Granulation Amount: Large (67-100%) N/A N/A Granulation Quality:  Red, Pink, Friable N/A N/A [name], [name] ([id number]) Necrotic Amount: None Present (0%) N/A N/A Exposed Structures: Fat Layer (Subcutaneous N/A N/A Tissue) Exposed: Yes Fascia: No Tendon: No Muscle: No Joint: No Bone: No Epithelialization: Medium (34-66%) N/A N/A Periwound Skin Texture: Excoriation: No N/A N/A Induration: No Callus: No Crepitus: No Rash: No  Scarring: No Periwound Skin Maceration: No N/A N/A Moisture: Dry/Scaly: No Periwound Skin Color: Atrophie Blanche: No N/A N/A Cyanosis: No Ecchymosis: No Erythema: No Hemosiderin Staining: No Mottled: No Pallor: No Rubor: No Temperature: No Abnormality N/A N/A Tenderness on No N/A N/A Palpation: Wound Preparation: Ulcer Cleansing: N/A N/A Rinsed/Irrigated with Saline Topical Anesthetic Applied: None Treatment Notes Electronic Signature(s) Signed: 01/23/2017 10:59:14 AM By: Evlyn Kanner MD, FACS Entered By: Evlyn Kanner on 01/23/2017 10:59:14 Bultema, [name] ([id number]) -------------------------------------------------------------------------------- Multi-Disciplinary Care Plan Details Patient Name: Armendariz, Tacarra B. Date of Service: 01/23/2017 10:15 AM Medical Record Number: [id number] Patient Account Number: [id number] Date of Birth/Sex: [birth year] (83 y.o. Female) Treating RN: Curtis Sites Primary Care Christyan Reger: Pearson Grippe Other Clinician: Referring Anaia Frith: Pearson Grippe Treating Javeah Loeza/Extender: Rudene Re in Treatment: 5 Active Inactive ` Abuse / Safety / Falls / Self Care Management Nursing Diagnoses: History of Falls Impaired physical mobility Goals: Patient will not experience any injury related to falls Date Initiated: 12/18/2016 Target Resolution Date: 01/15/2017 Goal Status: Active Interventions: Podiatry chair, stretcher in low position and side rails up as needed Provide education on safe transfers Notes: ` Orientation to the Wound Care Program Nursing  Diagnoses: Knowledge deficit related to the wound healing center program Goals: Patient/caregiver will verbalize understanding of the Wound Healing Center Program Date Initiated: 12/18/2016 Target Resolution Date: 01/15/2017 Goal Status: Active Interventions: Provide education on orientation to the wound center Notes: ` Wound/Skin Impairment Nursing Diagnoses: BELINDA, SCHLICHTING ([id number]) Impaired tissue integrity Goals: Ulcer/skin breakdown will heal within 14 weeks Date Initiated: 12/18/2016 Target Resolution Date: 01/15/2017 Goal Status: Active Interventions: Assess patient/caregiver ability to obtain necessary supplies Treatment Activities: Skin care regimen initiated : 12/18/2016 Notes: Electronic Signature(s) Signed: 01/23/2017 11:31:51 AM By: Curtis Sites Entered By: Curtis Sites on 01/23/2017 10:51:19 [name], [name] ([id number]) -------------------------------------------------------------------------------- Pain Assessment Details Patient Name: Chriscoe, Kinisha B. Date of Service: 01/23/2017 10:15 AM Medical Record Number: [id number] Patient Account Number: [id number] Date of Birth/Sex: November 04, 1922 (83 y.o. Female) Treating RN: Curtis Sites Primary Care Aseem Sessums: Pearson Grippe Other Clinician: Referring Jaidin Ugarte: Pearson Grippe Treating Laylynn Campanella/Extender: Rudene Re in Treatment: 5 Active Problems Location of Pain Severity and Description of Pain Patient Has Paino No Site Locations Pain Management and Medication Current Pain Management: Notes Topical or injectable lidocaine is offered to patient for acute pain when surgical debridement is performed. If needed, Patient is instructed to use over the counter pain medication for the following 24-48 hours after debridement. Wound care MDs do not prescribed pain medications. Patient has chronic pain or uncontrolled pain. Patient has been instructed to make an appointment with their Primary Care Physician for pain  management. Electronic Signature(s) Signed: 01/23/2017 11:31:51 AM By: Curtis Sites Entered By: Curtis Sites on 01/23/2017 10:31:09 [name], [name] ([id number]) -------------------------------------------------------------------------------- Patient/Caregiver Education Details Patient Name: Lindsay Iron B. Date of Service: 01/23/2017 10:15 AM Medical Record Number: [id number] Patient Account Number: [id number] Date of Birth/Gender: [birth year] (83 y.o. Female) Treating RN: Curtis Sites Primary Care Physician: Pearson Grippe Other Clinician: Referring Physician: Pearson Grippe Treating Physician/Extender: Rudene Re in Treatment: 5 Education Assessment Education Provided To: Caregiver Education Topics Provided Wound/Skin Impairment: Handouts: Other: reportable s/s Methods: Explain/Verbal Responses: State content correctly Electronic Signature(s) Signed: 01/23/2017 11:31:51 AM By: Curtis Sites Entered By: Curtis Sites on 01/23/2017 10:52:49 [name], [name] Kitchen ([id number]) -------------------------------------------------------------------------------- Wound Assessment Details Patient Name: Loveland, Danielle B. Date of Service: 01/23/2017 10:15 AM Medical Record Number: [id number] Patient Account Number: [id number] Date of Birth/Sex: 15-Aug-1923 (83 y.o. Female)  Treating RN: Curtis Sites Primary Care Sachin Ferencz: Pearson Grippe Other Clinician: Referring Evamarie Raetz: Pearson Grippe Treating Isabellah Sobocinski/Extender: Rudene Re in Treatment: 5 Wound Status Wound Number: 1 Primary Skin Tear Etiology: Wound Location: Left Upper Arm - Lateral Wound Open Wounding Event: Trauma Status: Date Acquired: 12/13/2016 Comorbid Chronic Obstructive Pulmonary Weeks Of Treatment: 5 History: Disease (COPD), Hypertension, Gout, Clustered Wound: No Dementia Photos Photo Uploaded By: Curtis Sites on 01/23/2017 10:42:20 Wound Measurements Length: (cm) 2.3 Width: (cm) 3.6 Depth: (cm) 0.1 Area:  (cm) 6.503 Volume: (cm) 0.65 % Reduction in Area: 70% % Reduction in Volume: 70% Epithelialization: Medium (34-66%) Tunneling: No Undermining: No Wound Description Classification: Partial Thickness Wound Margin: Flat and Intact Exudate Amount: Medium Exudate Type: Serous Exudate Color: amber Foul Odor After Cleansing: No Slough/Fibrino No Wound Bed Granulation Amount: Large (67-100%) Exposed Structure Granulation Quality: Red, Pink, Friable Fascia Exposed: No Necrotic Amount: None Present (0%) Fat Layer (Subcutaneous Tissue) Exposed: Yes Tendon Exposed: No Luffman, Kinisha B. ([id number]) Muscle Exposed: No Joint Exposed: No Bone Exposed: No Periwound Skin Texture Texture Color No Abnormalities Noted: No No Abnormalities Noted: No Callus: No Atrophie Blanche: No Crepitus: No Cyanosis: No Excoriation: No Ecchymosis: No Induration: No Erythema: No Rash: No Hemosiderin Staining: No Scarring: No Mottled: No Pallor: No Moisture Rubor: No No Abnormalities Noted: No Dry / Scaly: No Temperature / Pain Maceration: No Temperature: No Abnormality Wound Preparation Ulcer Cleansing: Rinsed/Irrigated with Saline Topical Anesthetic Applied: None Treatment Notes Wound #1 (Left, Lateral Upper Arm) 1. Cleansed with: Clean wound with Normal Saline 2. Anesthetic Topical Lidocaine 4% cream to wound bed prior to debridement 4. Dressing Applied: Hydrafera Blue 5. Secondary Dressing Applied Gauze and Kerlix/Conform Notes coban lightly to secure Electronic Signature(s) Signed: 01/23/2017 11:31:51 AM By: Curtis Sites Entered By: Curtis Sites on 01/23/2017 10:40:40 Hemler, [name] ([id number]) -------------------------------------------------------------------------------- Vitals Details Patient Name: Ervine, Hydee B. Date of Service: 01/23/2017 10:15 AM Medical Record Number: [id number] Patient Account Number: [id number] Date of Birth/Sex: 1923-03-25 (83 y.o.  Female) Treating RN: Curtis Sites Primary Care Kahealani Yankovich: Pearson Grippe Other Clinician: Referring Ramez Arrona: Pearson Grippe Treating Darivs Lunden/Extender: Rudene Re in Treatment: 5 Vital Signs Time Taken: 10:31 Temperature (F): 97.6 Height (in): 60 Pulse (bpm): 67 Respiratory Rate (breaths/min): 16 Blood Pressure (mmHg): 121/70 Reference Range: 80 - 120 mg / dl Electronic Signature(s) Signed: 01/23/2017 11:31:51 AM By: Curtis Sites Entered By: Curtis Sites on 01/23/2017 10:31:34

## 2017-01-25 NOTE — Progress Notes (Signed)
JAHARI, WIGINTON ([id number]) Visit Report for 01/23/2017 Chief Complaint Document Details Patient Name: [name], [name] Date of Service: 01/23/2017 10:15 AM Medical Record Number: [id number] Patient Account Number: [id number] Date of Birth/Sex: 16-Jan-1923 (83 y.o. Female) Treating RN: Curtis Sites Primary Care Provider: Pearson Grippe Other Clinician: Referring Provider: Pearson Grippe Treating Provider/Extender: Rudene Re in Treatment: 5 Information Obtained from: Patient Chief Complaint Patient presents to the wound care center for a consult due non healing wound to the left upper arm for about a week Electronic Signature(s) Signed: 01/23/2017 10:59:21 AM By: Evlyn Kanner MD, FACS Entered By: Evlyn Kanner on 01/23/2017 10:59:20 Berres, Larinda Buttery ([id number]) -------------------------------------------------------------------------------- HPI Details Patient Name: [name], [name] Date of Service: 01/23/2017 10:15 AM Medical Record Number: [id number] Patient Account Number: [id number] Date of Birth/Sex: 1923-01-09 (83 y.o. Female) Treating RN: Curtis Sites Primary Care Provider: Pearson Grippe Other Clinician: Referring Provider: Pearson Grippe Treating Provider/Extender: Rudene Re in Treatment: 5 History of Present Illness Location: left upper arm Quality: Patient reports experiencing a sharp pain to affected area(s). Severity: Patient states wound are getting worse. Duration: Patient has had the wound for < 1 week prior to presenting for treatment Timing: Pain in wound is Intermittent (comes and goes Context: The wound occurred when the patient had a fall and injured her left upper arm Modifying Factors: Other treatment(s) tried include:went to the ER with Neosporin was applied and a dressing placed Associated Signs and Symptoms: Patient reports having increase discharge. HPI Description: 83 year old patient who was recently seen in the ER for a left arm abrasion. The  patient has dementia and as per the caregivers she sustained a skin tear on her left arm. past medical history significant for arthritis, cough and chronic bronchitis, COPD, hypertension,status post appendectomy, hip fracture surgery, protein calorie malnutrition. the skin tear was dressed with Neosporin and a non-adherent dressing was applied to the arm and wrapped with gauze and was told to see the wound care service Electronic Signature(s) Signed: 01/23/2017 10:59:30 AM By: Evlyn Kanner MD, FACS Entered By: Evlyn Kanner on 01/23/2017 10:59:29 Piatkowski, Larinda Buttery ([id number]) -------------------------------------------------------------------------------- Physical Exam Details Patient Name: [name], [name] Date of Service: 01/23/2017 10:15 AM Medical Record Number: [id number] Patient Account Number: [id number] Date of Birth/Sex: [birth year] (83 y.o. Female) Treating RN: Curtis Sites Primary Care Provider: Pearson Grippe Other Clinician: Referring Provider: Pearson Grippe Treating Provider/Extender: Rudene Re in Treatment: 5 Constitutional . Pulse regular. Respirations normal and unlabored. Afebrile. . Eyes Nonicteric. Reactive to light. Ears, Nose, Mouth, and Throat Lips, teeth, and gums WNL.Marland Kitchen Moist mucosa without lesions. Neck supple and nontender. No palpable supraclavicular or cervical adenopathy. Normal sized without goiter. Respiratory WNL. No retractions.. Cardiovascular Pedal Pulses WNL. No clubbing, cyanosis or edema. Lymphatic No adneopathy. No adenopathy. No adenopathy. Musculoskeletal Adexa without tenderness or enlargement.. Digits and nails w/o clubbing, cyanosis, infection, petechiae, ischemia, or inflammatory conditions.. Integumentary (Hair, Skin) No suspicious lesions. No crepitus or fluctuance. No peri-wound warmth or erythema. No masses.Marland Kitchen Psychiatric Judgement and insight Intact.. No evidence of depression, anxiety, or agitation.. Notes there is still a  large area which has not completely closed but the overall quality of the scar is rather flimsy and keeps getting abraded. Electronic Signature(s) Signed: 01/23/2017 11:00:25 AM By: Evlyn Kanner MD, FACS Entered By: Evlyn Kanner on 01/23/2017 11:00:25 Bayliss, Larinda Buttery ([id number]) -------------------------------------------------------------------------------- Physician Orders Details Patient Name: [name], [name] Date of Service: 01/23/2017 10:15 AM Medical Record Number: [id number] Patient Account Number: [id number] Date of Birth/Sex: 1923-02-13 (  83 y.o. Female) Treating RN: Curtis Sites Primary Care Provider: Pearson Grippe Other Clinician: Referring Provider: Pearson Grippe Treating Provider/Extender: Rudene Re in Treatment: 5 Verbal / Phone Orders: No Diagnosis Coding Wound Cleansing Wound #1 Left,Lateral Upper Arm o Clean wound with Normal Saline. - in clinic Anesthetic Wound #1 Left,Lateral Upper Arm o Topical Lidocaine 4% cream applied to wound bed prior to debridement Primary Wound Dressing Wound #1 Left,Lateral Upper Arm o Hydrafera Blue Secondary Dressing Wound #1 Left,Lateral Upper Arm o Dry Gauze o Kerlix and Coban Dressing Change Frequency Wound #1 Left,Lateral Upper Arm o Change dressing every week Follow-up Appointments Wound #1 Left,Lateral Upper Arm o Return Appointment in 1 week. Electronic Signature(s) Signed: 01/23/2017 11:31:51 AM By: Curtis Sites Signed: 01/23/2017 4:12:58 PM By: Evlyn Kanner MD, FACS Entered By: Curtis Sites on 01/23/2017 10:53:55 Liese, Larinda Buttery ([id number]) -------------------------------------------------------------------------------- Problem List Details Patient Name: [name], [name] Date of Service: 01/23/2017 10:15 AM Medical Record Number: [id number] Patient Account Number: [id number] Date of Birth/Sex: 1923/06/18 (83 y.o. Female) Treating RN: Curtis Sites Primary Care Provider: Pearson Grippe Other  Clinician: Referring Provider: Pearson Grippe Treating Provider/Extender: Rudene Re in Treatment: 5 Active Problems ICD-10 Encounter Code Description Active Date Diagnosis S41.112A Laceration without foreign body of left upper arm, initial 12/18/2016 Yes encounter S41.102A Unspecified open wound of left upper arm, initial 12/18/2016 Yes encounter E44.0 Moderate protein-calorie malnutrition 12/18/2016 Yes Inactive Problems Resolved Problems Electronic Signature(s) Signed: 01/23/2017 10:59:07 AM By: Evlyn Kanner MD, FACS Entered By: Evlyn Kanner on 01/23/2017 10:59:07 [name], [name] Kitchen ([id number]) -------------------------------------------------------------------------------- Progress Note Details Patient Name: [name], [name] Date of Service: 01/23/2017 10:15 AM Medical Record Number: [id number] Patient Account Number: [id number] Date of Birth/Sex: 1922-10-12 (83 y.o. Female) Treating RN: Curtis Sites Primary Care Provider: Pearson Grippe Other Clinician: Referring Provider: Pearson Grippe Treating Provider/Extender: Rudene Re in Treatment: 5 Subjective Chief Complaint Information obtained from Patient Patient presents to the wound care center for a consult due non healing wound to the left upper arm for about a week History of Present Illness (HPI) The following HPI elements were documented for the patient's wound: Location: left upper arm Quality: Patient reports experiencing a sharp pain to affected area(s). Severity: Patient states wound are getting worse. Duration: Patient has had the wound for < 1 week prior to presenting for treatment Timing: Pain in wound is Intermittent (comes and goes Context: The wound occurred when the patient had a fall and injured her left upper arm Modifying Factors: Other treatment(s) tried include:went to the ER with Neosporin was applied and a dressing placed Associated Signs and Symptoms: Patient reports having increase  discharge. 83 year old patient who was recently seen in the ER for a left arm abrasion. The patient has dementia and as per the caregivers she sustained a skin tear on her left arm. past medical history significant for arthritis, cough and chronic bronchitis, COPD, hypertension,status post appendectomy, hip fracture surgery, protein calorie malnutrition. the skin tear was dressed with Neosporin and a non-adherent dressing was applied to the arm and wrapped with gauze and was told to see the wound care service Objective Constitutional Pulse regular. Respirations normal and unlabored. Afebrile. Vitals Time Taken: 10:31 AM, Height: 60 in, Temperature: 97.6 F, Pulse: 67 bpm, Respiratory Rate: 16 breaths/min, Blood Pressure: 121/70 mmHg. [name], [name] ([id number]) Eyes Nonicteric. Reactive to light. Ears, Nose, Mouth, and Throat Lips, teeth, and gums WNL.Marland Kitchen Moist mucosa without lesions. Neck supple and nontender. No palpable supraclavicular or cervical adenopathy. Normal sized  without goiter. Respiratory WNL. No retractions.. Cardiovascular Pedal Pulses WNL. No clubbing, cyanosis or edema. Lymphatic No adneopathy. No adenopathy. No adenopathy. Musculoskeletal Adexa without tenderness or enlargement.. Digits and nails w/o clubbing, cyanosis, infection, petechiae, ischemia, or inflammatory conditions.Marland Kitchen. Psychiatric Judgement and insight Intact.. No evidence of depression, anxiety, or agitation.. General Notes: there is still a large area which has not completely closed but the overall quality of the scar is rather flimsy and keeps getting abraded. Integumentary (Hair, Skin) No suspicious lesions. No crepitus or fluctuance. No peri-wound warmth or erythema. No masses.. Wound #1 status is Open. Original cause of wound was Trauma. The wound is located on the Left,Lateral Upper Arm. The wound measures 2.3cm length x 3.6cm width x 0.1cm depth; 6.503cm^2 area and 0.65cm^3 volume. There is  Fat Layer (Subcutaneous Tissue) Exposed exposed. There is no tunneling or undermining noted. There is a medium amount of serous drainage noted. The wound margin is flat and intact. There is large (67-100%) red, pink, friable granulation within the wound bed. There is no necrotic tissue within the wound bed. The periwound skin appearance did not exhibit: Callus, Crepitus, Excoriation, Induration, Rash, Scarring, Dry/Scaly, Maceration, Atrophie Blanche, Cyanosis, Ecchymosis, Hemosiderin Staining, Mottled, Pallor, Rubor, Erythema. Periwound temperature was noted as No Abnormality. Assessment Active Problems ICD-10 S41.112A - Laceration without foreign body of left upper arm, initial encounter [name], [name] ([id number]) S41.102A - Unspecified open wound of left upper arm, initial encounter E44.0 - Moderate protein-calorie malnutrition Plan Wound Cleansing: Wound #1 Left,Lateral Upper Arm: Clean wound with Normal Saline. - in clinic Anesthetic: Wound #1 Left,Lateral Upper Arm: Topical Lidocaine 4% cream applied to wound bed prior to debridement Primary Wound Dressing: Wound #1 Left,Lateral Upper Arm: Hydrafera Blue Secondary Dressing: Wound #1 Left,Lateral Upper Arm: Dry Gauze Kerlix and Coban Dressing Change Frequency: Wound #1 Left,Lateral Upper Arm: Change dressing every week Follow-up Appointments: Wound #1 Left,Lateral Upper Arm: Return Appointment in 1 week. She looks much better today and at this stage I have recommended Hydrofera Blue a few pieces of gauze and a light Kerlix and Coban dressing to be left intact for the week. I have instructed them that if the dressing gets moist to change it midweek Electronic Signature(s) Signed: 01/23/2017 11:01:17 AM By: Evlyn KannerBritto, Emrick Hensch MD, FACS Entered By: Evlyn KannerBritto, Dariush Mcnellis on 01/23/2017 11:01:17 [name], [name] ([id number]) -------------------------------------------------------------------------------- SuperBill Details Patient Name:  [name], [name] Date of Service: 01/23/2017 Medical Record Number: [id number] Patient Account Number: [id number] Date of Birth/Sex: 08/26/1922 (83 y.o. Female) Treating RN: Curtis Sitesorthy, Joanna Primary Care Provider: [name], [name] Other Clinician: Referring Provider: [name], [name] Treating Provider/Extender: Rudene ReBritto, Criselda Starke Weeks in Treatment: 5 Diagnosis Coding ICD-10 Codes Code Description [phone] Laceration without foreign body of left upper arm, initial encounter S41.102A Unspecified open wound of left upper arm, initial encounter E44.0 Moderate protein-calorie malnutrition Facility Procedures CPT4 Code: [id number] Description: [id number] - WOUND CARE VISIT-LEV 2 EST PT Modifier: Quantity: 1 Physician Procedures CPT4 Code Description: [id number] - WC PHYS LEVEL 3 - EST PT ICD-10 Description Diagnosis S41.112A Laceration without foreign body of left upper arm, S41.102A Unspecified open wound of left upper arm, initial e E44.0 Moderate protein-calorie  malnutrition Modifier: initial encount ncounter Quantity: 1 er Electronic Signature(s) Signed: 01/23/2017 11:19:44 AM By: Curtis Sitesorthy, Joanna Signed: 01/23/2017 4:12:58 PM By: Evlyn KannerBritto, Taziyah Iannuzzi MD, FACS Previous Signature: 01/23/2017 11:01:32 AM Version By: Evlyn KannerBritto, Jibreel Fedewa MD, FACS Entered By: Curtis Sitesorthy, Joanna on 01/23/2017 11:19:44

## 2017-01-30 ENCOUNTER — Encounter: Payer: Medicare HMO | Admitting: Surgery

## 2017-01-30 DIAGNOSIS — I10 Essential (primary) hypertension: Secondary | ICD-10-CM | POA: Diagnosis not present

## 2017-01-30 DIAGNOSIS — K219 Gastro-esophageal reflux disease without esophagitis: Secondary | ICD-10-CM | POA: Diagnosis not present

## 2017-01-30 DIAGNOSIS — S41112A Laceration without foreign body of left upper arm, initial encounter: Secondary | ICD-10-CM | POA: Diagnosis not present

## 2017-01-30 DIAGNOSIS — M1990 Unspecified osteoarthritis, unspecified site: Secondary | ICD-10-CM | POA: Diagnosis not present

## 2017-01-30 DIAGNOSIS — F0390 Unspecified dementia without behavioral disturbance: Secondary | ICD-10-CM | POA: Diagnosis not present

## 2017-01-30 DIAGNOSIS — S41102A Unspecified open wound of left upper arm, initial encounter: Secondary | ICD-10-CM | POA: Diagnosis not present

## 2017-01-30 DIAGNOSIS — J449 Chronic obstructive pulmonary disease, unspecified: Secondary | ICD-10-CM | POA: Diagnosis not present

## 2017-01-30 DIAGNOSIS — M109 Gout, unspecified: Secondary | ICD-10-CM | POA: Diagnosis not present

## 2017-01-30 DIAGNOSIS — E440 Moderate protein-calorie malnutrition: Secondary | ICD-10-CM | POA: Diagnosis not present

## 2017-02-01 NOTE — Progress Notes (Addendum)
[name], [name]. ([id number]) Visit Report for 01/30/2017 Arrival Information Details Patient Name: [name], [name]. Date of Service: 01/30/2017 10:15 AM Medical Record Number: [id number] Patient Account Number: [id number] Date of Birth/Sex: 12/18/1922 (83 y.o. Female) Treating RN: [name], Lindsay Primary Care [name]: [name] Other Clinician: Referring [name]: [name] Treating [name]: [name] in Treatment: 6 Visit Information History Since Last Visit Added or deleted any medications: No Patient Arrived: Ambulatory Any new allergies or adverse reactions: No Arrival Time: 10:35 Had a fall or experienced change in No Accompanied By: staff activities of daily living that may affect Transfer Assistance: None risk of falls: Patient Identification Verified: Yes Signs or symptoms of abuse/neglect since last No Secondary Verification Process Yes visito Completed: Hospitalized since last visit: No Patient Requires Transmission-Based No Has Dressing in Place as Prescribed: Yes Precautions: Pain Present Now: No Patient Has Alerts: No Electronic Signature(s) Signed: 01/30/2017 4:38:01 PM By: [name], Lindsay Entered By: [name], Lindsay on 01/30/2017 10:35:45 [name], Lindsay [name]. ([id number]) -------------------------------------------------------------------------------- Clinic Level of Care Assessment Details Patient Name: [name]. Date of Service: 01/30/2017 10:15 AM Medical Record Number: [id number] Patient Account Number: [id number] Date of Birth/Sex: 11/01/1922 (83 y.o. Female) Treating RN: [name], Lindsay Primary Care Anelisse Jacobson: [name] Other Clinician: Referring [name]: [name] Treating Teran Knittle/Extender: [name] in Treatment: 6 Clinic Level of Care Assessment Items TOOL 4 Quantity Score []  - Use when only an EandM is performed on FOLLOW-UP visit 0 ASSESSMENTS - Nursing Assessment / Reassessment X - Reassessment of  Co-morbidities (includes updates in patient status) 1 10 X - Reassessment of Adherence to Treatment Plan 1 5 ASSESSMENTS - Wound and Skin Assessment / Reassessment X - Simple Wound Assessment / Reassessment - one wound 1 5 []  - Complex Wound Assessment / Reassessment - multiple wounds 0 []  - Dermatologic / Skin Assessment (not related to wound area) 0 ASSESSMENTS - Focused Assessment []  - Circumferential Edema Measurements - multi extremities 0 []  - Nutritional Assessment / Counseling / Intervention 0 []  - Lower Extremity Assessment (monofilament, tuning fork, pulses) 0 []  - Peripheral Arterial Disease Assessment (using hand held doppler) 0 ASSESSMENTS - Ostomy and/or Continence Assessment and Care []  - Incontinence Assessment and Management 0 []  - Ostomy Care Assessment and Management (repouching, etc.) 0 PROCESS - Coordination of Care X - Simple Patient / Family Education for ongoing care 1 15 []  - Complex (extensive) Patient / Family Education for ongoing care 0 []  - Staff obtains ChiropractorConsents, Records, Test Results / Process Orders 0 []  - Staff telephones HHA, Nursing Homes / Clarify orders / etc 0 []  - Routine Transfer to another Facility (non-emergent condition) 0 [name], [name]. ([id number]) []  - Routine Hospital Admission (non-emergent condition) 0 []  - New Admissions / Manufacturing engineernsurance Authorizations / Ordering NPWT, Apligraf, etc. 0 []  - Emergency Hospital Admission (emergent condition) 0 X - Simple Discharge Coordination 1 10 []  - Complex (extensive) Discharge Coordination 0 PROCESS - Special Needs []  - Pediatric / Minor Patient Management 0 []  - Isolation Patient Management 0 []  - Hearing / Language / Visual special needs 0 []  - Assessment of Community assistance (transportation, D/C planning, etc.) 0 []  - Additional assistance / Altered mentation 0 []  - Support Surface(s) Assessment (bed, cushion, seat, etc.) 0 INTERVENTIONS - Wound Cleansing / Measurement X - Simple Wound  Cleansing - one wound 1 5 []  - Complex Wound Cleansing - multiple wounds 0 X - Wound Imaging (photographs - any number of wounds) 1 5 []  - Wound  Tracing (instead of photographs) 0 X - Simple Wound Measurement - one wound 1 5 []  - Complex Wound Measurement - multiple wounds 0 INTERVENTIONS - Wound Dressings X - Small Wound Dressing one or multiple wounds 1 10 []  - Medium Wound Dressing one or multiple wounds 0 []  - Large Wound Dressing one or multiple wounds 0 []  - Application of Medications - topical 0 []  - Application of Medications - injection 0 INTERVENTIONS - Miscellaneous []  - External ear exam 0 [name]. ([id number]) []  - Specimen Collection (cultures, biopsies, blood, body fluids, etc.) 0 []  - Specimen(s) / Culture(s) sent or taken to Lab for analysis 0 []  - Patient Transfer (multiple staff / Michiel Sites Lift / Similar devices) 0 []  - Simple Staple / Suture removal (25 or less) 0 []  - Complex Staple / Suture removal (26 or more) 0 []  - Hypo / Hyperglycemic Management (close monitor of Blood Glucose) 0 []  - Ankle / Brachial Index (ABI) - do not check if billed separately 0 X - Vital Signs 1 5 Has the patient been seen at the hospital within the last three years: Yes Total Score: 75 Level Of Care: New/Established - Level 2 Electronic Signature(s) Signed: 01/30/2017 4:38:01 PM By: Lindsay Sites Entered By: Lindsay Sites on 01/30/2017 14:16:27 [name], [name] ([id number]) -------------------------------------------------------------------------------- Encounter Discharge Information Details Patient Name: [name]. Date of Service: 01/30/2017 10:15 AM Medical Record Number: [id number] Patient Account Number: [id number] Date of Birth/Sex: Feb 20, 1923 (83 y.o. Female) Treating RN: Lindsay Sites Primary Care Betta Balla: Lindsay Grippe Other Clinician: Referring Tieisha Darden: Lindsay Grippe Treating Ziere Docken/Extender: Lindsay Re in Treatment: 6 Encounter Discharge  Information Items Discharge Pain Level: 0 Discharge Condition: Stable Ambulatory Status: Ambulatory Discharge Destination: Home Transportation: Private Auto Accompanied By: staff Schedule Follow-up Appointment: Yes Medication Reconciliation completed and provided to Patient/Care No Tyshika Baldridge: Provided on Clinical Summary of Care: 01/30/2017 Form Type Recipient Paper Patient CS Electronic Signature(s) Signed: 01/30/2017 11:07:24 AM By: Gwenlyn Perking Entered By: Gwenlyn Perking on 01/30/2017 11:07:24 Mcelwee, Layli BMarland Kitchen ([id number]) -------------------------------------------------------------------------------- Multi Wound Chart Details Patient Name: Navarrette, Georgeanne [name]. Date of Service: 01/30/2017 10:15 AM Medical Record Number: [id number] Patient Account Number: [id number] Date of Birth/Sex: 27-Oct-1922 (83 y.o. Female) Treating RN: Lindsay Sites Primary Care Paizley Ramella: Lindsay Grippe Other Clinician: Referring Elliet Goodnow: Lindsay Grippe Treating Sanjuanita Condrey/Extender: Lindsay Re in Treatment: 6 Vital Signs Height(in): 60 Pulse(bpm): 56 Weight(lbs): Blood Pressure 100/57 (mmHg): Body Mass Index(BMI): Temperature(F): 97.6 Respiratory Rate 16 (breaths/min): Photos: [1:No Photos] [N/A:N/A] Wound Location: [1:Left Upper Arm - Lateral] [N/A:N/A] Wounding Event: [1:Trauma] [N/A:N/A] Primary Etiology: [1:Skin Tear] [N/A:N/A] Comorbid History: [1:Chronic Obstructive Pulmonary Disease (COPD), Hypertension, Gout, Dementia] [N/A:N/A] Date Acquired: [1:12/13/2016] [N/A:N/A] [name] of Treatment: [1:6] [N/A:N/A] Wound Status: [1:Open] [N/A:N/A] Measurements L x W x D 2.2x2.4x0.1 [N/A:N/A] (cm) Area (cm) : [1:4.147] [N/A:N/A] Volume (cm) : [1:0.415] [N/A:N/A] % Reduction in Area: [1:80.90%] [N/A:N/A] % Reduction in Volume: 80.90% [N/A:N/A] Classification: [1:Partial Thickness] [N/A:N/A] Exudate Amount: [1:Medium] [N/A:N/A] Exudate Type: [1:Serous] [N/A:N/A] Exudate Color: [1:amber]  [N/A:N/A] Wound Margin: [1:Flat and Intact] [N/A:N/A] Granulation Amount: [1:Large (67-100%)] [N/A:N/A] Granulation Quality: [1:Red, Pink, Friable] [N/A:N/A] Necrotic Amount: [1:None Present (0%)] [N/A:N/A] Exposed Structures: [1:Fat Layer (Subcutaneous Tissue) Exposed: Yes Fascia: No Tendon: No Muscle: No] [N/A:N/A] Joint: No Bone: No Epithelialization: Medium (34-66%) N/A N/A Periwound Skin Texture: Excoriation: No N/A N/A Induration: No Callus: No Crepitus: No Rash: No Scarring: No Periwound Skin Maceration: No N/A N/A Moisture: Dry/Scaly: No Periwound Skin Color: Atrophie Blanche: No N/A N/A Cyanosis: No Ecchymosis: No  Erythema: No Hemosiderin Staining: No Mottled: No Pallor: No Rubor: No Temperature: No Abnormality N/A N/A Tenderness on No N/A N/A Palpation: Wound Preparation: Ulcer Cleansing: N/A N/A Rinsed/Irrigated with Saline Topical Anesthetic Applied: None Treatment Notes Electronic Signature(s) Signed: 01/30/2017 11:15:17 AM By: Evlyn Kanner MD, FACS Entered By: Evlyn Kanner on 01/30/2017 11:15:17 [name], [name] ([id number]) -------------------------------------------------------------------------------- Multi-Disciplinary Care Plan Details Patient Name: [name], [name]. Date of Service: 01/30/2017 10:15 AM Medical Record Number: [id number] Patient Account Number: [id number] Date of Birth/Sex: 29-Aug-1922 (83 y.o. Female) Treating RN: Lindsay Sites Primary Care Lajoy Vanamburg: Lindsay Grippe Other Clinician: Referring Dale Ribeiro: Lindsay Grippe Treating Taunja Brickner/Extender: Lindsay Re in Treatment: 6 Active Inactive Electronic Signature(s) Signed: 02/12/2017 4:12:54 PM By: Elliot Gurney, BSN, RN, CWS, Kim RN, BSN Signed: 02/12/2017 4:27:27 PM By: Lindsay Sites Previous Signature: 01/30/2017 4:38:01 PM Version By: Lindsay Sites Entered By: Elliot Gurney BSN, RN, CWS, Kim on 02/12/2017 16:12:54 [name], [name]  ([id number]) -------------------------------------------------------------------------------- Pain Assessment Details Patient Name: [name], Katey [name]. Date of Service: 01/30/2017 10:15 AM Medical Record Number: [id number] Patient Account Number: [id number] Date of Birth/Sex: [birth year] (83 y.o. Female) Treating RN: Lindsay Sites Primary Care Lindaann Gradilla: Lindsay Grippe Other Clinician: Referring Everard Interrante: Lindsay Grippe Treating Ahuva Poynor/Extender: Lindsay Re in Treatment: 6 Active Problems Location of Pain Severity and Description of Pain Patient Has Paino No Site Locations Pain Management and Medication Current Pain Management: Notes Topical or injectable lidocaine is offered to patient for acute pain when surgical debridement is performed. If needed, Patient is instructed to use over the counter pain medication for the following 24-48 hours after debridement. Wound care MDs do not prescribed pain medications. Patient has chronic pain or uncontrolled pain. Patient has been instructed to make an appointment with their Primary Care Physician for pain management Electronic Signature(s) Signed: 01/30/2017 4:38:01 PM By: Lindsay Sites Entered By: Lindsay Sites on 01/30/2017 10:36:00 [name], [name] ([id number]) -------------------------------------------------------------------------------- Patient/Caregiver Education Details Patient Name: Lindsay Iron [name]. Date of Service: 01/30/2017 10:15 AM Medical Record Number: [id number] Patient Account Number: [id number] Date of Birth/Gender: Oct 28, 1922 (83 y.o. Female) Treating RN: Lindsay Sites Primary Care Physician: Lindsay Grippe Other Clinician: Referring Physician: Pearson Grippe Treating Physician/Extender: Lindsay Re in Treatment: 6 Education Assessment Education Provided To: Caregiver Education Topics Provided Wound/Skin Impairment: Handouts: Other: wound care as ordered Methods: Demonstration, Explain/Verbal Responses: State  content correctly Electronic Signature(s) Signed: 01/30/2017 4:38:01 PM By: Lindsay Sites Entered By: Lindsay Sites on 01/30/2017 10:51:21 [name], Lindsay BMarland Kitchen ([id number]) -------------------------------------------------------------------------------- Wound Assessment Details Patient Name: [name], [name]. Date of Service: 01/30/2017 10:15 AM Medical Record Number: [id number] Patient Account Number: [id number] Date of Birth/Sex: [birth year] (83 y.o. Female) Treating RN: Lindsay Sites Primary Care Garvis Downum: Lindsay Grippe Other Clinician: Referring Cuahutemoc Attar: Lindsay Grippe Treating Ardith Test/Extender: Lindsay Re in Treatment: 6 Wound Status Wound Number: 1 Primary Skin Tear Etiology: Wound Location: Left Upper Arm - Lateral Wound Open Wounding Event: Trauma Status: Date Acquired: 12/13/2016 Comorbid Chronic Obstructive Pulmonary [name] Of Treatment: 6 History: Disease (COPD), Hypertension, Gout, Clustered Wound: No Dementia Photos Photo Uploaded By: Lindsay Sites on 01/30/2017 14:35:28 Wound Measurements Length: (cm) 2.2 Width: (cm) 2.4 Depth: (cm) 0.1 Area: (cm) 4.147 Volume: (cm) 0.415 % Reduction in Area: 80.9% % Reduction in Volume: 80.9% Epithelialization: Medium (34-66%) Tunneling: No Undermining: No Wound Description Classification: Partial Thickness Wound Margin: Flat and Intact Exudate Amount: Medium Exudate Type: Serous Exudate Color: amber Foul Odor After Cleansing: No Slough/Fibrino No Wound Bed Granulation Amount: Large (67-100%) Exposed Structure Granulation Quality: Red, Pink, Friable Fascia Exposed:  No Necrotic Amount: None Present (0%) Fat Layer (Subcutaneous Tissue) Exposed: Yes Tendon Exposed: No Arganbright, Allegra [name]. ([id number]) Muscle Exposed: No Joint Exposed: No Bone Exposed: No Periwound Skin Texture Texture Color No Abnormalities Noted: No No Abnormalities Noted: No Callus: No Atrophie Blanche: No Crepitus: No Cyanosis:  No Excoriation: No Ecchymosis: No Induration: No Erythema: No Rash: No Hemosiderin Staining: No Scarring: No Mottled: No Pallor: No Moisture Rubor: No No Abnormalities Noted: No Dry / Scaly: No Temperature / Pain Maceration: No Temperature: No Abnormality Wound Preparation Ulcer Cleansing: Rinsed/Irrigated with Saline Topical Anesthetic Applied: None Electronic Signature(s) Signed: 01/30/2017 4:38:01 PM By: Lindsay Sites Entered By: Lindsay Sites on 01/30/2017 10:48:27 Bosques, [place] BMarland Kitchen ([id number]) -------------------------------------------------------------------------------- Vitals Details Patient Name: Gade, Arthur [name]. Date of Service: 01/30/2017 10:15 AM Medical Record Number: [id number] Patient Account Number: [id number] Date of Birth/Sex: [birth year] (83 y.o. Female) Treating RN: Lindsay Sites Primary Care Aubrey Blackard: Lindsay Grippe Other Clinician: Referring Daija Routson: Lindsay Grippe Treating Miner Koral/Extender: Lindsay Re in Treatment: 6 Vital Signs Time Taken: 10:36 Temperature (F): 97.6 Height (in): 60 Pulse (bpm): 56 Respiratory Rate (breaths/min): 16 Blood Pressure (mmHg): 100/57 Reference Range: 80 - 120 mg / dl Electronic Signature(s) Signed: 01/30/2017 4:38:01 PM By: Lindsay Sites Entered By: Lindsay Sites on 01/30/2017 10:38:19

## 2017-02-01 NOTE — Progress Notes (Signed)
[name] ([id number]) Visit Report for 01/30/2017 Chief Complaint Document Details Patient Name: [name], [name] Date of Service: 01/30/2017 10:15 AM Medical Record Number: [id number] Patient Account Number: [id number] Date of Birth/Sex: August 09, 1923 (83 y.o. Female) Treating RN: Curtis Sites Primary Care Provider: Pearson Grippe Other Clinician: Referring Provider: Pearson Grippe Treating Provider/Extender: Rudene Re in Treatment: 6 Information Obtained from: Patient Chief Complaint Patient presents to the wound care center for a consult due non healing wound to the left upper arm for about a week Electronic Signature(s) Signed: 01/30/2017 11:15:37 AM By: Evlyn Kanner MD, FACS Entered By: Evlyn Kanner on 01/30/2017 11:15:37 [name], [name] ([id number]) -------------------------------------------------------------------------------- HPI Details Patient Name: [name], [name] Date of Service: 01/30/2017 10:15 AM Medical Record Number: [id number] Patient Account Number: [id number] Date of Birth/Sex: 1923/03/02 (83 y.o. Female) Treating RN: Curtis Sites Primary Care Provider: Pearson Grippe Other Clinician: Referring Provider: Pearson Grippe Treating Provider/Extender: Rudene Re in Treatment: 6 History of Present Illness Location: left upper arm Quality: Patient reports experiencing a sharp pain to affected area(s). Severity: Patient states wound are getting worse. Duration: Patient has had the wound for < 1 week prior to presenting for treatment Timing: Pain in wound is Intermittent (comes and goes Context: The wound occurred when the patient had a fall and injured her left upper arm Modifying Factors: Other treatment(s) tried include:went to the ER with Neosporin was applied and a dressing placed Associated Signs and Symptoms: Patient reports having increase discharge. HPI Description: 83 year old patient who was recently seen in the ER for a left arm abrasion.  The patient has dementia and as per the caregivers she sustained a skin tear on her left arm. past medical history significant for arthritis, cough and chronic bronchitis, COPD, hypertension,status post appendectomy, hip fracture surgery, protein calorie malnutrition. the skin tear was dressed with Neosporin and a non-adherent dressing was applied to the arm and wrapped with gauze and was told to see the wound care service Electronic Signature(s) Signed: 01/30/2017 11:15:41 AM By: Evlyn Kanner MD, FACS Entered By: Evlyn Kanner on 01/30/2017 11:15:41 [name] ([id number]) -------------------------------------------------------------------------------- Physical Exam Details Patient Name: [name], [name] Date of Service: 01/30/2017 10:15 AM Medical Record Number: [id number] Patient Account Number: [id number] Date of Birth/Sex: 05-23-1923 (83 y.o. Female) Treating RN: Curtis Sites Primary Care Provider: Pearson Grippe Other Clinician: Referring Provider: Pearson Grippe Treating Provider/Extender: Rudene Re in Treatment: 6 Constitutional . Pulse regular. Respirations normal and unlabored. Afebrile. . Eyes Nonicteric. Reactive to light. Ears, Nose, Mouth, and Throat Lips, teeth, and gums WNL.Marland Kitchen Moist mucosa without lesions. Neck supple and nontender. No palpable supraclavicular or cervical adenopathy. Normal sized without goiter. Respiratory WNL. No retractions.. Cardiovascular Pedal Pulses WNL. No clubbing, cyanosis or edema. Chest Breasts symmetical and no nipple discharge.. Breast tissue WNL, no masses, lumps, or tenderness.. Lymphatic No adneopathy. No adenopathy. No adenopathy. Musculoskeletal Adexa without tenderness or enlargement.. Digits and nails w/o clubbing, cyanosis, infection, petechiae, ischemia, or inflammatory conditions.. Integumentary (Hair, Skin) No suspicious lesions. No crepitus or fluctuance. No peri-wound warmth or erythema. No  masses.Marland Kitchen Psychiatric Judgement and insight Intact.. No evidence of depression, anxiety, or agitation.. Notes the wound looks excellent has healthy granulation tissue and there is no surrounding cellulitis Electronic Signature(s) Signed: 01/30/2017 11:16:11 AM By: Evlyn Kanner MD, FACS Entered By: Evlyn Kanner on 01/30/2017 11:16:10 [name] ([id number]) -------------------------------------------------------------------------------- Physician Orders Details Patient Name: [name], [name] Date of Service: 01/30/2017 10:15 AM Medical Record Number: [id number] Patient Account Number: [id number]  Date of Birth/Sex: 04/22/1923 (83 y.o. Female) Treating RN: Curtis Sitesorthy, Lindsay Primary Care Provider: [name], [name] Other Clinician: Referring Provider: [name], [name] Treating Provider/Extender: Rudene ReBritto, Aracelly Tencza Weeks in Treatment: 6 Verbal / Phone Orders: No Diagnosis Coding Wound Cleansing Wound #1 Left,Lateral Upper Arm o Clean wound with Normal Saline. - in clinic Anesthetic Wound #1 Left,Lateral Upper Arm o Topical Lidocaine 4% cream applied to wound bed prior to debridement Primary Wound Dressing Wound #1 Left,Lateral Upper Arm o Hydrafera Blue - moisten with saline and let this soak prior to removing Secondary Dressing Wound #1 Left,Lateral Upper Arm o Dry Gauze o Kerlix and Coban - lightly to secure Dressing Change Frequency Wound #1 Left,Lateral Upper Arm o [facility] - [facility] and twice at home Follow-up Appointments Wound #1 Left,Lateral Upper Arm o Return Appointment in 1 week. Electronic Signature(s) Signed: 01/30/2017 4:13:16 PM By: Evlyn KannerBritto, Geni Skorupski MD, FACS Signed: 01/30/2017 4:38:01 PM By: Curtis Sitesorthy, Lindsay Entered By: Curtis Sitesorthy, Lindsay on 01/30/2017 10:52:51 [name], [name] ([id number]) -------------------------------------------------------------------------------- Problem List Details Patient Name: [name], [name] Date of  Service: 01/30/2017 10:15 AM Medical Record Number: [id number] Patient Account Number: [id number] Date of Birth/Sex: 01/09/1923 (83 y.o. Female) Treating RN: Curtis Sitesorthy, Lindsay Primary Care Provider: [name], [name] Other Clinician: Referring Provider: [name], [name] Treating Provider/Extender: Rudene ReBritto, Hovanes Hymas Weeks in Treatment: 6 Active Problems ICD-10 Encounter Code Description Active Date Diagnosis S41.112A Laceration without foreign body of left upper arm, initial 12/18/2016 Yes encounter S41.102A Unspecified open wound of left upper arm, initial 12/18/2016 Yes encounter E44.0 Moderate protein-calorie malnutrition 12/18/2016 Yes Inactive Problems Resolved Problems Electronic Signature(s) Signed: 01/30/2017 11:15:01 AM By: Evlyn KannerBritto, Danne Vasek MD, FACS Entered By: Evlyn KannerBritto, Jhordan Mckibben on 01/30/2017 11:15:01 [name], [name] ([id number]) -------------------------------------------------------------------------------- Progress Note Details Patient Name: [name], [name] Date of Service: 01/30/2017 10:15 AM Medical Record Number: [id number] Patient Account Number: [id number] Date of Birth/Sex: 11/24/1922 (83 y.o. Female) Treating RN: Curtis Sitesorthy, Lindsay Primary Care Provider: [name], [name] Other Clinician: Referring Provider: [name], [name] Treating Provider/Extender: Rudene ReBritto, Sharron  Weeks in Treatment: 6 Subjective Chief Complaint Information obtained from Patient Patient presents to the wound care center for a consult due non healing wound to the left upper arm for about a week History of Present Illness (HPI) The following HPI elements were documented for the patient's wound: Location: left upper arm Quality: Patient reports experiencing a sharp pain to affected area(s). Severity: Patient states wound are getting worse. Duration: Patient has had the wound for < 1 week prior to presenting for treatment Timing: Pain in wound is Intermittent (comes and goes Context: The wound occurred when the patient had a fall and  injured her left upper arm Modifying Factors: Other treatment(s) tried include:went to the ER with Neosporin was applied and a dressing placed Associated Signs and Symptoms: Patient reports having increase discharge. 83 year old patient who was recently seen in the ER for a left arm abrasion. The patient has dementia and as per the caregivers she sustained a skin tear on her left arm. past medical history significant for arthritis, cough and chronic bronchitis, COPD, hypertension,status post appendectomy, hip fracture surgery, protein calorie malnutrition. the skin tear was dressed with Neosporin and a non-adherent dressing was applied to the arm and wrapped with gauze and was told to see the wound care service Objective Constitutional Pulse regular. Respirations normal and unlabored. Afebrile. Vitals Time Taken: 10:36 AM, Height: 60 in, Temperature: 97.6 F, Pulse: 56 bpm, Respiratory Rate: 16 breaths/min, Blood Pressure: 100/57 mmHg. [name], [name] ([id number]) Eyes Nonicteric.  Reactive to light. Ears, Nose, Mouth, and Throat Lips, teeth, and gums WNL.Marland Kitchen Moist mucosa without lesions. Neck supple and nontender. No palpable supraclavicular or cervical adenopathy. Normal sized without goiter. Respiratory WNL. No retractions.. Cardiovascular Pedal Pulses WNL. No clubbing, cyanosis or edema. Chest Breasts symmetical and no nipple discharge.. Breast tissue WNL, no masses, lumps, or tenderness.. Lymphatic No adneopathy. No adenopathy. No adenopathy. Musculoskeletal Adexa without tenderness or enlargement.. Digits and nails w/o clubbing, cyanosis, infection, petechiae, ischemia, or inflammatory conditions.Marland Kitchen Psychiatric Judgement and insight Intact.. No evidence of depression, anxiety, or agitation.. General Notes: the wound looks excellent has healthy granulation tissue and there is no surrounding cellulitis Integumentary (Hair, Skin) No suspicious lesions. No crepitus or  fluctuance. No peri-wound warmth or erythema. No masses.. Wound #1 status is Open. Original cause of wound was Trauma. The wound is located on the Left,Lateral Upper Arm. The wound measures 2.2cm length x 2.4cm width x 0.1cm depth; 4.147cm^2 area and 0.415cm^3 volume. There is Fat Layer (Subcutaneous Tissue) Exposed exposed. There is no tunneling or undermining noted. There is a medium amount of serous drainage noted. The wound margin is flat and intact. There is large (67-100%) red, pink, friable granulation within the wound bed. There is no necrotic tissue within the wound bed. The periwound skin appearance did not exhibit: Callus, Crepitus, Excoriation, Induration, Rash, Scarring, Dry/Scaly, Maceration, Atrophie Blanche, Cyanosis, Ecchymosis, Hemosiderin Staining, Mottled, Pallor, Rubor, Erythema. Periwound temperature was noted as No Abnormality. Assessment [name], [name] ([id number]) Active Problems ICD-10 S41.112A - Laceration without foreign body of left upper arm, initial encounter S41.102A - Unspecified open wound of left upper arm, initial encounter E44.0 - Moderate protein-calorie malnutrition Plan Wound Cleansing: Wound #1 Left,Lateral Upper Arm: Clean wound with Normal Saline. - in clinic Anesthetic: Wound #1 Left,Lateral Upper Arm: Topical Lidocaine 4% cream applied to wound bed prior to debridement Primary Wound Dressing: Wound #1 Left,Lateral Upper Arm: Hydrafera Blue - moisten with saline and let this soak prior to removing Secondary Dressing: Wound #1 Left,Lateral Upper Arm: Dry Gauze Kerlix and Coban - lightly to secure Dressing Change Frequency: Wound #1 Left,Lateral Upper Arm: Three times weekly - once at [facility] and twice at home Follow-up Appointments: Wound #1 Left,Lateral Upper Arm: Return Appointment in 1 week. At this stage I have recommended: 1. Hydrofera Blue and aborted foam to be changed 3 times a week. 2. Continue with  adequate nutrition and vitamin supplements 3. regular visits the wound center Electronic Signature(s) EMILE, RINGGENBERG ([id number]) Signed: 01/30/2017 11:17:36 AM By: Evlyn Kanner MD, FACS Entered By: Evlyn Kanner on 01/30/2017 11:17:36 Balli, [name] ([id number]) -------------------------------------------------------------------------------- SuperBill Details Patient Name: Leavelle, Onda B. Date of Service: 01/30/2017 Medical Record Number: [id number] Patient Account Number: [id number] Date of Birth/Sex: 10/10/1922 (83 y.o. Female) Treating RN: Curtis Sites Primary Care Provider: Pearson Grippe Other Clinician: Referring Provider: Pearson Grippe Treating Provider/Extender: Rudene Re in Treatment: 6 Diagnosis Coding ICD-10 Codes Code Description [phone] Laceration without foreign body of left upper arm, initial encounter S41.102A Unspecified open wound of left upper arm, initial encounter E44.0 Moderate protein-calorie malnutrition Facility Procedures CPT4 Code: [id number] Description: [phone] - WOUND CARE VISIT-LEV 2 EST PT Modifier: Quantity: 1 Physician Procedures CPT4 Code Description: [id number] - WC PHYS LEVEL 3 - EST PT ICD-10 Description Diagnosis S41.112A Laceration without foreign body of left upper arm, S41.102A Unspecified open wound of left upper arm, initial e E44.0 Moderate protein-calorie  malnutrition Modifier: initial encount ncounter Quantity: 1 er Psychologist, [facility] probation services) Signed:  01/30/2017 2:16:38 PM By: Curtis Sites Signed: 01/30/2017 4:13:16 PM By: Evlyn Kanner MD, FACS Previous Signature: 01/30/2017 11:17:52 AM Version By: Evlyn Kanner MD, FACS Entered By: Curtis Sites on 01/30/2017 14:16:37

## 2017-02-06 ENCOUNTER — Ambulatory Visit: Payer: Medicare HMO | Admitting: Physician Assistant

## 2017-02-28 ENCOUNTER — Encounter (HOSPITAL_COMMUNITY): Payer: Self-pay

## 2017-02-28 ENCOUNTER — Emergency Department (HOSPITAL_COMMUNITY)
Admission: EM | Admit: 2017-02-28 | Discharge: 2017-02-28 | Disposition: A | Discharge status: Home / Self Care | Payer: Medicare Other | Attending: Emergency Medicine | Admitting: Emergency Medicine

## 2017-02-28 DIAGNOSIS — G301 Alzheimer's disease with late onset: Secondary | ICD-10-CM | POA: Diagnosis not present

## 2017-02-28 DIAGNOSIS — I10 Essential (primary) hypertension: Secondary | ICD-10-CM | POA: Insufficient documentation

## 2017-02-28 DIAGNOSIS — Z79899 Other long term (current) drug therapy: Secondary | ICD-10-CM | POA: Diagnosis not present

## 2017-02-28 DIAGNOSIS — Z87891 Personal history of nicotine dependence: Secondary | ICD-10-CM | POA: Insufficient documentation

## 2017-02-28 DIAGNOSIS — H05232 Hemorrhage of left orbit: Secondary | ICD-10-CM | POA: Diagnosis not present

## 2017-02-28 DIAGNOSIS — J449 Chronic obstructive pulmonary disease, unspecified: Secondary | ICD-10-CM | POA: Diagnosis not present

## 2017-02-28 DIAGNOSIS — F0280 Dementia in other diseases classified elsewhere without behavioral disturbance: Secondary | ICD-10-CM | POA: Diagnosis not present

## 2017-02-28 DIAGNOSIS — H5712 Ocular pain, left eye: Secondary | ICD-10-CM | POA: Diagnosis present

## 2017-02-28 HISTORY — DX: Unspecified dementia, unspecified severity, without behavioral disturbance, psychotic disturbance, mood disturbance, and anxiety: F03.90

## 2017-02-28 NOTE — ED Provider Notes (Signed)
[facility]-EMERGENCY DEPT Provider Note   CSN: [id number] Arrival date & time: 02/28/17  1018     History   Chief Complaint Chief Complaint  Patient presents with  . Fall    HPI [name] is a 83 y.o. female.  HPI Patient is seen with her power of attorney at bedside. Power of attorney is a very distant relative via a god parent. She reports she has however familiar with the patient and visits her. She saw the patient yesterday and she had no facial injury. She reports at baseline the patient has severe dementia and had to be moved to a memory care unit about 2 weeks ago. She however gets up and walks around with her walker. She reports she was notified this morning that the facility was sending her to the emergency department because she had a swollen bruise beside her left eye. No one had any information on the fall but presumably she fell and her power of attorney suspect she probably got back up and sat down in her chair again. Past Medical History:  Diagnosis Date  . Arthritis   . Chronic bronchitis (HCC)   . COPD (chronic obstructive pulmonary disease) (HCC)   . Dementia   . GERD (gastroesophageal reflux disease)   . Gout   . H/O hiatal hernia   . High cholesterol   . Hypertension     Patient Active Problem List   Diagnosis Date Noted  . Failure to thrive in adult 04/15/2016  . Acute encephalopathy 04/15/2016  . GERD (gastroesophageal reflux disease) 04/15/2016  . Orthostatic hypotension 03/18/2016  . Orthostatic dizziness 03/18/2016  . Postinflammatory pulmonary fibrosis (HCC) 04/29/2015  . Dyspnea 04/26/2015  . Severe malnutrition (HCC) 02/23/2015  . AKI (acute kidney injury) (HCC) 02/22/2015  . Arthritis 02/22/2015  . Weakness   . Dehydration 02/21/2015  . ARF (acute renal failure) (HCC) 10/03/2014  . Underweight 07/20/2014  . Acute bronchitis 07/20/2014  . COPD GOLD I with reversibility  07/18/2014  . Essential hypertension 07/18/2014  . Gout 07/18/2014    . SBO (small bowel obstruction) (HCC) 01/14/2014  . Hypercalcemia 01/14/2014  . Protein-calorie malnutrition, severe (HCC) 10/10/2013  . Acute respiratory failure (HCC) 10/07/2013  . Hypokalemia 10/07/2013  . H. influenzae septicemia (HCC) 12/06/2012  . Leukocytopenia, unspecified 12/03/2012  . HTN (hypertension) 12/02/2012  . Gouty arthritis 12/02/2012    Past Surgical History:  Procedure Laterality Date  . APPENDECTOMY  1945  . CATARACT EXTRACTION W/ INTRAOCULAR LENS  IMPLANT, BILATERAL Bilateral   . FRACTURE SURGERY    . HIP FRACTURE SURGERY     "don't remember which side"  . TEE WITHOUT CARDIOVERSION N/A 10/13/2013   Procedure: TRANSESOPHAGEAL ECHOCARDIOGRAM (TEE);  Surgeon: Quintella Reichertraci R Turner, MD;  [facility];  Service: Cardiovascular;  Laterality: N/A;    OB History    No data available       Home Medications    Prior to Admission medications   Medication Sig Start Date End Date Taking? Authorizing Provider  acetaminophen (TYLENOL) 325 MG tablet Take 2 tablets (650 mg total) by mouth every 6 (six) hours as needed for mild pain (or Fever >/= 101). Patient not taking: Reported on 08/15/2016 03/21/16   Regalado, Jon BillingsBelkys A, MD  albuterol (PROVENTIL HFA;VENTOLIN HFA) 108 (90 Base) MCG/ACT inhaler Inhale 2 puffs into the lungs every 4 (four) hours as needed for wheezing or shortness of breath. 12/01/16   Ward, Chase PicketJaime Pilcher, PA-C  allopurinol (ZYLOPRIM) 300 MG tablet Take 300  mg by mouth daily.     [provider]  atenolol (TENORMIN) 25 MG tablet Take 25 mg by mouth at bedtime.     [provider]  atorvastatin (LIPITOR) 20 MG tablet Take 20 mg by mouth at bedtime.    [provider]  budesonide (PULMICORT) 0.25 MG/2ML nebulizer solution Take 2 mLs (0.25 mg total) by nebulization 2 (two) times daily. Patient not taking: Reported on 08/15/2016 10/12/15   Nyoka Cowden, MD  donepezil (ARICEPT) 5 MG tablet Take 1 tablet (5 mg total) by mouth at  bedtime. Patient not taking: Reported on 08/15/2016 04/16/16   Rolly Salter, MD  ipratropium-albuterol (DUONEB) 0.5-2.5 (3) MG/3ML SOLN Take 3 mLs by nebulization every 6 (six) hours as needed. 12/01/16   Ward, Chase Picket, PA-C  losartan (COZAAR) 100 MG tablet Take 100 mg by mouth daily.     [provider]  meclizine (ANTIVERT) 25 MG tablet Take 0.5 tablets (12.5 mg total) by mouth 3 (three) times daily as needed for dizziness. Patient not taking: Reported on 08/15/2016 01/05/16   Arby Barrette, MD  neomycin-bacitracin-polymyxin (NEOSPORIN) ointment Apply 1 application topically every 12 (twelve) hours. apply to eye 12/13/16   Enid Derry, PA-C  OLANZapine (ZYPREXA) 5 MG tablet Take 5 mg by mouth at bedtime. 07/11/16   [provider]  pantoprazole (PROTONIX) 40 MG tablet TAKE 1 TABLET BY MOUTH EVERY DAY 30-60 MINUTES BEFORE FIRST MEAL OF THE DAY Patient taking differently: Take 40 mg by mouth once a day 10/22/15   Nyoka Cowden, MD  polyethylene glycol (MIRALAX / GLYCOLAX) packet Take 17 g by mouth daily as needed for mild constipation. Patient not taking: Reported on 08/15/2016 04/16/16   Rolly Salter, MD  predniSONE (DELTASONE) 20 MG tablet Take 2 tablets (40 mg total) by mouth daily. Patient not taking: Reported on 08/15/2016 04/18/16   Sam, Ace Gins, PA-C  predniSONE (DELTASONE) 20 MG tablet Take 2 tablets (40 mg total) by mouth daily. Patient not taking: Reported on 08/15/2016 05/12/16   Lorre Nick, MD    Family History Family History  Problem Relation Age of Onset  . Cancer Mother   . Stroke Father   . Cirrhosis Brother     Social History Social History  Substance Use Topics  . Smoking status: Former Smoker    Packs/day: 0.50    Years: 70.00    Quit date: 08/18/2008  . Smokeless tobacco: Former Neurosurgeon    Quit date: 10/13/2004  . Alcohol use No     Allergies   Penicillins   Review of Systems Review of Systems Level V caveat cannot obtain  review systems due to dementia  Physical Exam Updated Vital Signs BP (!) 143/84 (BP Location: Left Arm)   Pulse 65   Temp 98.5 F (36.9 C) (Oral)   Resp 15   Ht 5\' 2"  (1.575 m)   Wt 45.4 kg (100 lb)   SpO2 98%   BMI 18.29 kg/m   Physical Exam  Constitutional:  Patient is very thin. Borderline cachectic. No respiratory distress. No appearance of pain or discomfort.  HENT:  Patient has moderate sized hematoma in the left brow. Some diffuse ecchymoses around the left eye. Patient can however open the eye fully with visualization of the complete iris. Palpating the orbital ridge do not feel any defects or crepitus. Patient is not expressed significant pain with palpation of the orbital ridge no other facial injury present. No nasal bleeding. Oral cavity is  patent dentition is poor but no evidence of intraoral injury.  Eyes: Pupils are equal, round, and reactive to light. EOM are normal.  Significant arcus senilis. Extraocular motions are normal. There is no subconjunctival hematoma or injection of the left eye.  Neck: Neck supple.  No pain with range of motion of neck. Patient has advanced arthritis and kyphosis.  Cardiovascular: Normal rate, regular rhythm and normal heart sounds.   Pulmonary/Chest: Effort normal and breath sounds normal.  No chest wall tenderness to compression. No contusions or abrasions to the patient's back or posterior thorax  Abdominal: Soft. She exhibits no distension. There is no tenderness.  Musculoskeletal: Normal range of motion.  Patient cooperates with putting bilateral upper extremities through range of motion and bilateral lower extremities to range of motion with bicycling motion. No expression of pain or weakness. No apparent contusions or abrasions to extremities  Neurological: She is alert.  Patient is alert but confused. As cooperate appropriately with exam. She assists in all movements without any evidence of a localizing motor deficit.  Skin: Skin  is warm and dry.  Psychiatric:  Patient is calm and cooperative.     ED Treatments / Results  Labs (all labs ordered are listed, but only abnormal results are displayed) Labs Reviewed - No data to display  EKG  EKG Interpretation None       Radiology No results found.  Procedures Procedures (including critical care time)  Medications Ordered in ED Medications - No data to display   Initial Impression / Assessment and Plan / ED Course  I have reviewed the triage vital signs and the nursing notes.  Pertinent labs & imaging results that were available during my care of the patient were reviewed by me and considered in my medical decision making (see chart for details).     Final Clinical Impressions(s) / ED Diagnoses   Final diagnoses:  Periorbital hematoma of left eye  Late onset Alzheimer's disease without behavioral disturbance   Patient is evaluated with power of attorney at bedside. Patient is at baseline. She has a periorbital hematoma on the left but physical exam does not suggest any orbital floor fracture with entrapment or ocular injury. Mental status is at baseline. No evidence of injury of the body on physical exam. Patient will be returned to nursing home care for ongoing observation. New Prescriptions New Prescriptions   No medications on file     Arby Barrette, MD 02/28/17 1222

## 2017-02-28 NOTE — ED Notes (Addendum)
[facility] there-Patricia took report

## 2017-02-28 NOTE — ED Triage Notes (Signed)
PT RECEIVED FROM [facility] VIA EMS FOR AN UNWITNESSED FALL. PER EMS, THE STAFF STS THAT THEY CHECK ON THE RESIDENTS Q20 MINUTES, AND WHEN THEY FIRST SAW HER SHE WAS OK. THE NEXT 20 MINUTE CHECK, SHE WAS FOUND ON THE FLOOR. PT HAS A HX OF DEMENTIA, AAO TO SELF. PER STAFF, SHE IS AT HER BASELINE. PT HAS BRUISING TO THE LEFT EYE, AND CLOTH COLLAR.

## 2017-02-28 NOTE — ED Notes (Signed)
[facility] called to transport pt back to [facility]

## 2017-02-28 NOTE — ED Notes (Signed)
Bed: WA08 Expected date:  Expected time:  Means of arrival:  Comments: EMS 

## 2017-02-28 NOTE — ED Notes (Signed)
BED ALARM APPLIED FOR SAFETY.

## 2017-03-14 ENCOUNTER — Encounter (HOSPITAL_COMMUNITY): Payer: Self-pay | Admitting: Emergency Medicine

## 2017-03-14 ENCOUNTER — Emergency Department (HOSPITAL_COMMUNITY)
Admission: EM | Admit: 2017-03-14 | Discharge: 2017-03-14 | Disposition: A | Discharge status: Home / Self Care | Payer: Medicare Other | Attending: Emergency Medicine | Admitting: Emergency Medicine

## 2017-03-14 DIAGNOSIS — W1830XA Fall on same level, unspecified, initial encounter: Secondary | ICD-10-CM | POA: Diagnosis not present

## 2017-03-14 DIAGNOSIS — Z87891 Personal history of nicotine dependence: Secondary | ICD-10-CM | POA: Diagnosis not present

## 2017-03-14 DIAGNOSIS — J449 Chronic obstructive pulmonary disease, unspecified: Secondary | ICD-10-CM | POA: Diagnosis not present

## 2017-03-14 DIAGNOSIS — Y939 Activity, unspecified: Secondary | ICD-10-CM | POA: Diagnosis not present

## 2017-03-14 DIAGNOSIS — Y999 Unspecified external cause status: Secondary | ICD-10-CM | POA: Insufficient documentation

## 2017-03-14 DIAGNOSIS — M25559 Pain in unspecified hip: Secondary | ICD-10-CM | POA: Diagnosis present

## 2017-03-14 DIAGNOSIS — S76211A Strain of adductor muscle, fascia and tendon of right thigh, initial encounter: Secondary | ICD-10-CM | POA: Diagnosis not present

## 2017-03-14 DIAGNOSIS — T148XXA Other injury of unspecified body region, initial encounter: Secondary | ICD-10-CM

## 2017-03-14 DIAGNOSIS — Y92129 Unspecified place in nursing home as the place of occurrence of the external cause: Secondary | ICD-10-CM | POA: Diagnosis not present

## 2017-03-14 DIAGNOSIS — W19XXXA Unspecified fall, initial encounter: Secondary | ICD-10-CM

## 2017-03-14 DIAGNOSIS — I10 Essential (primary) hypertension: Secondary | ICD-10-CM | POA: Diagnosis not present

## 2017-03-14 DIAGNOSIS — Z79899 Other long term (current) drug therapy: Secondary | ICD-10-CM | POA: Diagnosis not present

## 2017-03-14 NOTE — ED Provider Notes (Signed)
[facility]-EMERGENCY DEPT Provider Note   CSN: [id number] Arrival date & time: 03/14/17  [id number]     History   Chief Complaint Chief Complaint  Patient presents with  . Fall  . Hip Pain    HPI [name] is a 83 y.o. female.  She presents for evaluation from her skilled nursing facility after a fall. Mechanism of fall is unknown. Facility was concerned that she had a fractured hip. Patient recalls falling onto her buttocks, but cannot give additional information.  Level V caveat- dementia  HPI  Past Medical History:  Diagnosis Date  . Arthritis   . Chronic bronchitis (HCC)   . COPD (chronic obstructive pulmonary disease) (HCC)   . Dementia   . GERD (gastroesophageal reflux disease)   . Gout   . H/O hiatal hernia   . High cholesterol   . Hypertension     Patient Active Problem List   Diagnosis Date Noted  . Failure to thrive in adult 04/15/2016  . Acute encephalopathy 04/15/2016  . GERD (gastroesophageal reflux disease) 04/15/2016  . Orthostatic hypotension 03/18/2016  . Orthostatic dizziness 03/18/2016  . Postinflammatory pulmonary fibrosis (HCC) 04/29/2015  . Dyspnea 04/26/2015  . Severe malnutrition (HCC) 02/23/2015  . AKI (acute kidney injury) (HCC) 02/22/2015  . Arthritis 02/22/2015  . Weakness   . Dehydration 02/21/2015  . ARF (acute renal failure) (HCC) 10/03/2014  . Underweight 07/20/2014  . Acute bronchitis 07/20/2014  . COPD GOLD I with reversibility  07/18/2014  . Essential hypertension 07/18/2014  . Gout 07/18/2014  . SBO (small bowel obstruction) (HCC) 01/14/2014  . Hypercalcemia 01/14/2014  . Protein-calorie malnutrition, severe (HCC) 10/10/2013  . Acute respiratory failure (HCC) 10/07/2013  . Hypokalemia 10/07/2013  . H. influenzae septicemia (HCC) 12/06/2012  . Leukocytopenia, unspecified 12/03/2012  . HTN (hypertension) 12/02/2012  . Gouty arthritis 12/02/2012    Past Surgical History:  Procedure Laterality Date  . APPENDECTOMY  1945   . CATARACT EXTRACTION W/ INTRAOCULAR LENS  IMPLANT, BILATERAL Bilateral   . FRACTURE SURGERY    . HIP FRACTURE SURGERY     "don't remember which side"  . TEE WITHOUT CARDIOVERSION N/A 10/13/2013   Procedure: TRANSESOPHAGEAL ECHOCARDIOGRAM (TEE);  Surgeon: Quintella Reichertraci R Turner, MD;  [facility];  Service: Cardiovascular;  Laterality: N/A;    OB History    No data available       Home Medications    Prior to Admission medications   Medication Sig Start Date End Date Taking? Authorizing Provider  albuterol (PROVENTIL HFA;VENTOLIN HFA) 108 (90 Base) MCG/ACT inhaler Inhale 2 puffs into the lungs every 4 (four) hours as needed for wheezing or shortness of breath. 12/01/16  Yes Ward, Chase PicketJaime Pilcher, PA-C  atenolol (TENORMIN) 25 MG tablet Take 25 mg by mouth 2 (two) times daily.    Yes [provider]  atorvastatin (LIPITOR) 20 MG tablet Take 20 mg by mouth at bedtime.   Yes [provider]  ipratropium-albuterol (DUONEB) 0.5-2.5 (3) MG/3ML SOLN Take 3 mLs by nebulization every 6 (six) hours as needed. 12/01/16  Yes Ward, Chase PicketJaime Pilcher, PA-C  losartan (COZAAR) 100 MG tablet Take 100 mg by mouth daily.    Yes [provider]  OLANZapine (ZYPREXA) 5 MG tablet Take 5 mg by mouth at bedtime. 07/11/16  Yes [provider]  pantoprazole (PROTONIX) 40 MG tablet TAKE 1 TABLET BY MOUTH EVERY DAY 30-60 MINUTES BEFORE FIRST MEAL OF THE DAY Patient taking differently: Take 40 mg by mouth once a day  10/22/15  Yes Nyoka Cowden, MD  allopurinol (ZYLOPRIM) 300 MG tablet Take 300 mg by mouth daily.     [provider]    Family History Family History  Problem Relation Age of Onset  . Cancer Mother   . Stroke Father   . Cirrhosis Brother     Social History Social History  Substance Use Topics  . Smoking status: Former Smoker    Packs/day: 0.50    Years: 70.00    Quit date: 08/18/2008  . Smokeless tobacco: Former Neurosurgeon    Quit date: 10/13/2004  .  Alcohol use No     Allergies   Penicillins   Review of Systems Review of Systems  All other systems reviewed and are negative.    Physical Exam Updated Vital Signs BP (!) 166/96 (BP Location: Left Arm)   Pulse 70   Temp 98.5 F (36.9 C) (Oral)   Resp 18   SpO2 99%   Physical Exam  Constitutional: She appears well-developed.  Elderly, frail. Undernourished.  HENT:  Head: Normocephalic and atraumatic.  Eyes: Pupils are equal, round, and reactive to light. Conjunctivae and EOM are normal.  Neck: Normal range of motion and phonation normal. Neck supple.  Cardiovascular: Normal rate and regular rhythm.   Pulmonary/Chest: Effort normal and breath sounds normal. She exhibits no tenderness.  Abdominal: Soft. She exhibits no distension. There is no tenderness. There is no guarding.  Musculoskeletal: Normal range of motion.  Normal active and passive range of motion, arms, legs, bilaterally. No large joint deformity.  Neurological: She is alert. She exhibits normal muscle tone.  Skin: Skin is warm and dry.  Psychiatric: She has a normal mood and affect. Her behavior is normal.  Nursing note and vitals reviewed.    ED Treatments / Results  Labs (all labs ordered are listed, but only abnormal results are displayed) Labs Reviewed - No data to display  EKG  EKG Interpretation None       Radiology No results found.  Procedures Procedures (including critical care time)  Medications Ordered in ED Medications - No data to display   Initial Impression / Assessment and Plan / ED Course  I have reviewed the triage vital signs and the nursing notes.  Pertinent labs & imaging results that were available during my care of the patient were reviewed by me and considered in my medical decision making (see chart for details).  Clinical Course as of Mar 15 923  Sat Mar 14, 2017  1610 Patient able to ambulate easily, and told the nurse that she was having some pain in her  right groin while walking.  [EW]    Clinical Course User Index [EW] Mancel Bale, MD     Patient Vitals for the past 24 hrs:  BP Temp Temp src Pulse Resp SpO2  03/14/17 0845 (!) 166/96 98.5 F (36.9 C) Oral 70 18 99 %    9:23 AM Reevaluation with update and discussion. After initial assessment and treatment, an updated evaluation reveals no change in clinical status. Audwin Semper L    Final Clinical Impressions(s) / ED Diagnoses   Final diagnoses:  Fall, initial encounter  Muscle strain   Fall, with right groin muscle strain. Doubt fracture, serious bacterial infection or metabolic instability.  Nursing Notes Reviewed/ Care Coordinated Applicable Imaging Reviewed Interpretation of Laboratory Data incorporated into ED treatment  The patient appears reasonably screened and/or stabilized for discharge and I doubt any other medical condition or other [facility] requiring further  screening, evaluation, or treatment in the ED at this time prior to discharge.  Plan: Home Medications- APAP for pain, continue usual medications; Home Treatments- rest, fluids; return here if the recommended treatment, does not improve the symptoms; Recommended follow up- PCP, when necessary    New Prescriptions New Prescriptions   No medications on file     Mancel BaleWentz, Micalah Cabezas, MD 03/14/17 [id number]

## 2017-03-14 NOTE — ED Notes (Signed)
[facility] to [facility]

## 2017-03-14 NOTE — Discharge Instructions (Signed)
Take Tylenol if needed for pain °

## 2017-03-14 NOTE — ED Notes (Signed)
Bed: WA10 Expected date: 03/14/17 Expected time: 8:27 AM Means of arrival: Ambulance Comments: Fall, hip pain, wheezing

## 2017-03-14 NOTE — ED Notes (Signed)
Patient ambulated in hallway with walker.

## 2017-03-14 NOTE — ED Triage Notes (Signed)
Patient [facility] via [facility] with complaints of unwitnessed fall. Initially complains of right hip pain. Currently not complaining of pain. Hx of dementia.

## 2017-03-14 NOTE — ED Notes (Signed)
[facility] called for transport.  

## 2017-03-14 NOTE — ED Notes (Signed)
Patient given breakfast tray.

## 2017-03-16 ENCOUNTER — Emergency Department (HOSPITAL_COMMUNITY)
Admission: EM | Admit: 2017-03-16 | Discharge: 2017-03-16 | Disposition: A | Discharge status: Home / Self Care | Payer: Medicare Other | Attending: Emergency Medicine | Admitting: Emergency Medicine

## 2017-03-16 ENCOUNTER — Encounter (HOSPITAL_COMMUNITY): Payer: Self-pay | Admitting: Nurse Practitioner

## 2017-03-16 DIAGNOSIS — K5641 Fecal impaction: Secondary | ICD-10-CM | POA: Insufficient documentation

## 2017-03-16 DIAGNOSIS — K5900 Constipation, unspecified: Secondary | ICD-10-CM | POA: Diagnosis present

## 2017-03-16 DIAGNOSIS — Z79899 Other long term (current) drug therapy: Secondary | ICD-10-CM | POA: Diagnosis not present

## 2017-03-16 DIAGNOSIS — F0390 Unspecified dementia without behavioral disturbance: Secondary | ICD-10-CM | POA: Insufficient documentation

## 2017-03-16 DIAGNOSIS — J449 Chronic obstructive pulmonary disease, unspecified: Secondary | ICD-10-CM | POA: Insufficient documentation

## 2017-03-16 DIAGNOSIS — Z87891 Personal history of nicotine dependence: Secondary | ICD-10-CM | POA: Insufficient documentation

## 2017-03-16 DIAGNOSIS — I10 Essential (primary) hypertension: Secondary | ICD-10-CM | POA: Insufficient documentation

## 2017-03-16 MED ORDER — POLYETHYLENE GLYCOL 3350 17 G PO PACK: 17.0000 g | PACK | Freq: Every day | ORAL | 0 refills | Status: AC

## 2017-03-16 MED ORDER — DOCUSATE SODIUM 100 MG PO CAPS: 100.0000 mg | ORAL_CAPSULE | Freq: Two times a day (BID) | ORAL | 0 refills | Status: AC

## 2017-03-16 NOTE — ED Triage Notes (Signed)
Per EMS pt from [facility] to be evaluated for bowel impaction. Pt last good BM was on Thursday. Staff administered milk of magnesia with small hard BM this morning but staff states felt more stool in rectum. Pt alert and oriented to self.

## 2017-03-16 NOTE — ED Notes (Signed)
[facility] arrived to transport pt.  States BP 191/98 right arm, BP 178/113 left arm.  [facility] uncomfortable transporting and spoke with Dr. Juleen ChinaKohut who states pt is asymptomatic and able to return to [facility]

## 2017-03-16 NOTE — Discharge Instructions (Signed)
Take Miralax and stool softner daily until you have one formed stool a day Drink plenty of fluids Return for worsening symptoms

## 2017-03-16 NOTE — ED Provider Notes (Signed)
[facility]-EMERGENCY DEPT Provider Note   CSN: [id number] Arrival date & time: 03/16/17  1136     History   Chief Complaint Chief Complaint  Patient presents with  . Constipation    HPI [name] is a 83 y.o. female who presents with constipation. She is [facility] and has not had a BM since Thursday (4 days ago). She reports pain in the rectum and states it hurts to walk. She was given milk of magnesia with no relief. She states overall she feels fine and denies abdominal pain.  LEVEL 5 CAVEAT due to dementia.  HPI  Past Medical History:  Diagnosis Date  . Arthritis   . Chronic bronchitis (HCC)   . COPD (chronic obstructive pulmonary disease) (HCC)   . Dementia   . GERD (gastroesophageal reflux disease)   . Gout   . H/O hiatal hernia   . High cholesterol   . Hypertension     Patient Active Problem List   Diagnosis Date Noted  . Failure to thrive in adult 04/15/2016  . Acute encephalopathy 04/15/2016  . GERD (gastroesophageal reflux disease) 04/15/2016  . Orthostatic hypotension 03/18/2016  . Orthostatic dizziness 03/18/2016  . Postinflammatory pulmonary fibrosis (HCC) 04/29/2015  . Dyspnea 04/26/2015  . Severe malnutrition (HCC) 02/23/2015  . AKI (acute kidney injury) (HCC) 02/22/2015  . Arthritis 02/22/2015  . Weakness   . Dehydration 02/21/2015  . ARF (acute renal failure) (HCC) 10/03/2014  . Underweight 07/20/2014  . Acute bronchitis 07/20/2014  . COPD GOLD I with reversibility  07/18/2014  . Essential hypertension 07/18/2014  . Gout 07/18/2014  . SBO (small bowel obstruction) (HCC) 01/14/2014  . Hypercalcemia 01/14/2014  . Protein-calorie malnutrition, severe (HCC) 10/10/2013  . Acute respiratory failure (HCC) 10/07/2013  . Hypokalemia 10/07/2013  . H. influenzae septicemia (HCC) 12/06/2012  . Leukocytopenia, unspecified 12/03/2012  . HTN (hypertension) 12/02/2012  . Gouty arthritis 12/02/2012    Past Surgical History:    Procedure Laterality Date  . APPENDECTOMY  1945  . CATARACT EXTRACTION W/ INTRAOCULAR LENS  IMPLANT, BILATERAL Bilateral   . FRACTURE SURGERY    . HIP FRACTURE SURGERY     "don't remember which side"  . TEE WITHOUT CARDIOVERSION N/A 10/13/2013   Procedure: TRANSESOPHAGEAL ECHOCARDIOGRAM (TEE);  Surgeon: Quintella Reichertraci R Turner, MD;  [facility];  Service: Cardiovascular;  Laterality: N/A;    OB History    No data available       Home Medications    Prior to Admission medications   Medication Sig Start Date End Date Taking? Authorizing Provider  albuterol (PROVENTIL HFA;VENTOLIN HFA) 108 (90 Base) MCG/ACT inhaler Inhale 2 puffs into the lungs every 4 (four) hours as needed for wheezing or shortness of breath. 12/01/16   Ward, Chase PicketJaime Pilcher, PA-C  allopurinol (ZYLOPRIM) 300 MG tablet Take 300 mg by mouth daily.     [provider]  atenolol (TENORMIN) 25 MG tablet Take 25 mg by mouth 2 (two) times daily.     [provider]  atorvastatin (LIPITOR) 20 MG tablet Take 20 mg by mouth at bedtime.    [provider]  ipratropium-albuterol (DUONEB) 0.5-2.5 (3) MG/3ML SOLN Take 3 mLs by nebulization every 6 (six) hours as needed. 12/01/16   Ward, Chase PicketJaime Pilcher, PA-C  losartan (COZAAR) 100 MG tablet Take 100 mg by mouth daily.     [provider]  OLANZapine (ZYPREXA) 5 MG tablet Take 5 mg by mouth at bedtime. 07/11/16   [provider]  pantoprazole (PROTONIX) 40 MG tablet TAKE 1 TABLET BY MOUTH EVERY DAY 30-60 MINUTES BEFORE FIRST MEAL OF THE DAY Patient taking differently: Take 40 mg by mouth once a day 10/22/15   Nyoka CowdenWert, Michael B, MD    Family History Family History  Problem Relation Age of Onset  . Cancer Mother   . Stroke Father   . Cirrhosis Brother     Social History Social History  Substance Use Topics  . Smoking status: Former Smoker    Packs/day: 0.50    Years: 70.00    Quit date: 08/18/2008  . Smokeless tobacco: Former NeurosurgeonUser     Quit date: 10/13/2004  . Alcohol use No     Allergies   Penicillins   Review of Systems Review of Systems  Unable to perform ROS: Dementia  Gastrointestinal: Positive for constipation. Negative for abdominal pain.     Physical Exam Updated Vital Signs BP (!) 168/99   Pulse 70   Temp 98 F (36.7 C) (Axillary)   Resp 16   SpO2 98%   Physical Exam  Constitutional: She appears well-developed and well-nourished. No distress.  Frail elderly female in NAD  HENT:  Head: Normocephalic and atraumatic.  Eyes: Pupils are equal, round, and reactive to light. Conjunctivae are normal. Right eye exhibits no discharge. Left eye exhibits no discharge. No scleral icterus.  Neck: Normal range of motion.  Cardiovascular: Normal rate and regular rhythm.  Exam reveals no gallop and no friction rub.   No murmur heard. Pulmonary/Chest: Effort normal and breath sounds normal. No respiratory distress. She has no wheezes. She has no rales. She exhibits no tenderness.  Abdominal: Soft. Bowel sounds are normal. She exhibits no distension and no mass. There is no tenderness. There is no rebound and no guarding. No hernia.  Genitourinary:  Genitourinary Comments: Rectal: Large amount of stool in rectal vault which was removed. No gross blood, hemorrhoids, fissures, redness, area of fluctuance, lesions, or tenderness. Chaperone present during exam.   Neurological: She is alert.  Oriented to self  Skin: Skin is warm and dry.  Psychiatric: She has a normal mood and affect. Her behavior is normal.  Nursing note and vitals reviewed.    ED Treatments / Results  Labs (all labs ordered are listed, but only abnormal results are displayed) Labs Reviewed - No data to display  EKG  EKG Interpretation None       Radiology No results found.  Procedures Fecal disimpaction Date/Time: 03/16/2017 2:11 PM Performed by: Terance HartGEKAS, Lanaiya Lantry [name] Authorized by: Terance HartGEKAS, Tien Spooner [name]  Consent: Verbal consent  obtained. Risks and benefits: risks, benefits and alternatives were discussed Consent given by: patient Patient understanding: patient states understanding of the procedure being performed Patient identity confirmed: verbally with patient and arm band Time out: Immediately prior to procedure a "time out" was called to verify the correct patient, procedure, equipment, support staff and site/side marked as required. Local anesthesia used: no  Anesthesia: Local anesthesia used: no  Sedation: Patient sedated: no Patient tolerance: Patient tolerated the procedure well with no immediate complications Comments: Large amount of light brown stool disimpacted    (including critical care time)  Medications Ordered in ED Medications - No data to display   Initial Impression / Assessment and Plan / ED Course  I have reviewed the triage vital signs and the nursing notes.  Pertinent labs & imaging results that were available during my care of the patient were reviewed by me and considered in my  medical decision making (see chart for details).  83 year old female with fecal impaction. She is hypertensive but otherwise vitals are normal and she is in NAD. Abdomen is soft, non-tender. Large amount of stool was disimpacted and pt reported feeling better. Discussed with Dr. Juleen China. Rx for Miralax and stool softener given. Will d/c back to [facility].  Final Clinical Impressions(s) / ED Diagnoses   Final diagnoses:  Constipation, unspecified constipation type  Fecal impaction in rectum [facility])    New Prescriptions New Prescriptions   No medications on file     Beryle Quant 03/16/17 1413    Raeford Razor, MD 03/22/17 [phone]

## 2017-03-18 ENCOUNTER — Other Ambulatory Visit: Payer: Self-pay | Admitting: *Deleted

## 2017-03-18 NOTE — Patient Outreach (Signed)
[facility] Care Management  03/18/2017  Katha HammingCallie B Seda 12/14/1922 [id number]   RN Health Coach telephone call to [facility] at [phone]. Spoke with  caregiver [place]iqu[place]. This patient does not have any additional care giver needs at this time. Patient has dementia and resides [facility]. .  Plan: Case closure Consumer assessed and no intervention needed.  Gean MaidensFrances Sheela Mcculley BSN RN [facility] [phone]

## 2017-06-02 ENCOUNTER — Emergency Department (HOSPITAL_COMMUNITY): Payer: Medicare Other

## 2017-06-02 ENCOUNTER — Emergency Department (HOSPITAL_COMMUNITY)
Admission: EM | Admit: 2017-06-02 | Discharge: 2017-06-02 | Disposition: A | Discharge status: Skilled Nursing Facility | Payer: Medicare Other | Attending: Emergency Medicine | Admitting: Emergency Medicine

## 2017-06-02 ENCOUNTER — Encounter (HOSPITAL_COMMUNITY): Payer: Self-pay | Admitting: Emergency Medicine

## 2017-06-02 DIAGNOSIS — Z23 Encounter for immunization: Secondary | ICD-10-CM | POA: Insufficient documentation

## 2017-06-02 DIAGNOSIS — J449 Chronic obstructive pulmonary disease, unspecified: Secondary | ICD-10-CM | POA: Insufficient documentation

## 2017-06-02 DIAGNOSIS — Z880 Allergy status to penicillin: Secondary | ICD-10-CM | POA: Diagnosis not present

## 2017-06-02 DIAGNOSIS — W19XXXA Unspecified fall, initial encounter: Secondary | ICD-10-CM | POA: Diagnosis not present

## 2017-06-02 DIAGNOSIS — S0990XA Unspecified injury of head, initial encounter: Secondary | ICD-10-CM

## 2017-06-02 DIAGNOSIS — Y999 Unspecified external cause status: Secondary | ICD-10-CM | POA: Insufficient documentation

## 2017-06-02 DIAGNOSIS — Z79899 Other long term (current) drug therapy: Secondary | ICD-10-CM | POA: Diagnosis not present

## 2017-06-02 DIAGNOSIS — Y929 Unspecified place or not applicable: Secondary | ICD-10-CM | POA: Insufficient documentation

## 2017-06-02 DIAGNOSIS — S81012A Laceration without foreign body, left knee, initial encounter: Secondary | ICD-10-CM | POA: Diagnosis not present

## 2017-06-02 DIAGNOSIS — Z87891 Personal history of nicotine dependence: Secondary | ICD-10-CM | POA: Diagnosis not present

## 2017-06-02 DIAGNOSIS — F0390 Unspecified dementia without behavioral disturbance: Secondary | ICD-10-CM | POA: Diagnosis not present

## 2017-06-02 DIAGNOSIS — S022XXA Fracture of nasal bones, initial encounter for closed fracture: Secondary | ICD-10-CM | POA: Diagnosis not present

## 2017-06-02 DIAGNOSIS — I10 Essential (primary) hypertension: Secondary | ICD-10-CM | POA: Insufficient documentation

## 2017-06-02 DIAGNOSIS — Y939 Activity, unspecified: Secondary | ICD-10-CM | POA: Insufficient documentation

## 2017-06-02 LAB — CBC WITH DIFFERENTIAL/PLATELET
BASOS ABS: 0 10*3/uL (ref 0.0–0.1)
BASOS PCT: 0 %
EOS ABS: 0.2 10*3/uL (ref 0.0–0.7)
Eosinophils Relative: 3 %
HCT: 34.8 % — ABNORMAL LOW (ref 36.0–46.0)
HEMOGLOBIN: 11.1 g/dL — AB (ref 12.0–15.0)
LYMPHS ABS: 1.5 10*3/uL (ref 0.7–4.0)
Lymphocytes Relative: 19 %
MCH: 28.3 pg (ref 26.0–34.0)
MCHC: 31.9 g/dL (ref 30.0–36.0)
MCV: 88.8 fL (ref 78.0–100.0)
Monocytes Absolute: 0.6 10*3/uL (ref 0.1–1.0)
Monocytes Relative: 8 %
Neutro Abs: 5.5 10*3/uL (ref 1.7–7.7)
Neutrophils Relative %: 70 %
Platelets: 212 10*3/uL (ref 150–400)
RBC: 3.92 MIL/uL (ref 3.87–5.11)
RDW: 14.5 % (ref 11.5–15.5)
WBC: 7.8 10*3/uL (ref 4.0–10.5)

## 2017-06-02 LAB — BASIC METABOLIC PANEL
ANION GAP: 7 (ref 5–15)
BUN: 36 mg/dL — ABNORMAL HIGH (ref 6–20)
CHLORIDE: 110 mmol/L (ref 101–111)
CO2: 27 mmol/L (ref 22–32)
Calcium: 9.2 mg/dL (ref 8.9–10.3)
Creatinine, Ser: 0.89 mg/dL (ref 0.44–1.00)
GFR calc non Af Amer: 55 mL/min — ABNORMAL LOW (ref >60)
Glucose, Bld: 93 mg/dL (ref 65–99)
POTASSIUM: 3.9 mmol/L (ref 3.5–5.1)
SODIUM: 144 mmol/L (ref 135–145)

## 2017-06-02 LAB — URINALYSIS, ROUTINE W REFLEX MICROSCOPIC
Bilirubin Urine: NEGATIVE
Glucose, UA: 50 mg/dL — AB
Hgb urine dipstick: NEGATIVE
Ketones, ur: NEGATIVE mg/dL
LEUKOCYTES UA: NEGATIVE
NITRITE: NEGATIVE
PROTEIN: NEGATIVE mg/dL
SPECIFIC GRAVITY, URINE: 1.009 (ref 1.005–1.030)
pH: 7 (ref 5.0–8.0)

## 2017-06-02 MED ORDER — IPRATROPIUM-ALBUTEROL 0.5-2.5 (3) MG/3ML IN SOLN
3.0000 mL | Freq: Once | RESPIRATORY_TRACT | Status: AC
  Administered 2017-06-02: 3 mL via RESPIRATORY_TRACT
  Filled 2017-06-02: qty 3

## 2017-06-02 MED ORDER — TETANUS-DIPHTH-ACELL PERTUSSIS 5-2.5-18.5 LF-MCG/0.5 IM SUSP
0.5000 mL | Freq: Once | INTRAMUSCULAR | Status: AC
  Administered 2017-06-02: 0.5 mL via INTRAMUSCULAR
  Filled 2017-06-02: qty 0.5

## 2017-06-02 MED ORDER — LIDOCAINE-EPINEPHRINE (PF) 2 %-1:200000 IJ SOLN
10.0000 mL | Freq: Once | INTRAMUSCULAR | Status: AC
  Administered 2017-06-02: 10 mL via INTRADERMAL
  Filled 2017-06-02: qty 20

## 2017-06-02 MED ORDER — ACETAMINOPHEN 325 MG PO TABS
650.0000 mg | ORAL_TABLET | Freq: Once | ORAL | Status: AC
  Administered 2017-06-02: 650 mg via ORAL
  Filled 2017-06-02: qty 2

## 2017-06-02 NOTE — ED Triage Notes (Signed)
Pt presents by EMS from [facility] for evaluation of fall. EMS advised pt had unwitnessed fall from standing. Lacerations to left knee, upper lip and hematoma to forehead. Denies LOC. EMS advised pt A&O to baseline.

## 2017-06-02 NOTE — ED Notes (Signed)
Spoke to a customer service rep. Gave [facility] contact number. They will be calling back.

## 2017-06-02 NOTE — ED Notes (Addendum)
Attempted to call [facility] at [phone]. No answer.

## 2017-06-02 NOTE — ED Notes (Signed)
[facility] contacted for discharge transport back to [facility]

## 2017-06-02 NOTE — ED Notes (Signed)
Unable to collect labs patient is not in the room 

## 2017-06-02 NOTE — ED Provider Notes (Signed)
..  Laceration Repair Date/Time: 06/02/2017 5:10 AM Performed by: Rise Mu Authorized by: Demetrios Loll T   Consent:    Consent obtained:  Verbal   Consent given by:  Patient   Risks discussed:  Infection, need for additional repair, nerve damage, poor wound healing, poor cosmetic result, pain, retained foreign body, tendon damage and vascular damage   Alternatives discussed:  No treatment Anesthesia (see MAR for exact dosages):    Anesthesia method:  Local infiltration   Local anesthetic:  Lidocaine 1% WITH epi Laceration details:    Location:  Leg   Leg location:  L knee   Length (cm):  4   Depth (mm):  2 Repair type:    Repair type:  Simple Pre-procedure details:    Preparation:  Imaging obtained to evaluate for foreign bodies and patient was prepped and draped in usual sterile fashion Exploration:    Hemostasis achieved with:  Direct pressure   Wound exploration: wound explored through full range of motion and entire depth of wound probed and visualized     Contaminated: no   Treatment:    Area cleansed with:  Betadine and saline   Amount of cleaning:  Standard   Irrigation solution:  Sterile saline   Irrigation volume:  60   Irrigation method:  Pressure wash   Visualized foreign bodies/material removed: no   Skin repair:    Repair method:  Sutures   Suture size:  3-0   Suture material:  Prolene   Suture technique:  Simple interrupted   Number of sutures:  5 Approximation:    Approximation:  Close   Vermilion border: well-aligned   Post-procedure details:    Dressing:  Antibiotic ointment and non-adherent dressing   Patient tolerance of procedure:  Tolerated well, no immediate complications      [name] 06/02/17 0512    Ward, Layla Maw, DO 06/02/17 1610

## 2017-06-02 NOTE — Progress Notes (Signed)
Pt not available in room, unable to administer nebulizer treatment at this time.

## 2017-06-02 NOTE — ED Notes (Addendum)
Multiple attempts to call report to [facility] with no response. Will continue to call.

## 2017-06-02 NOTE — ED Provider Notes (Signed)
TIME SEEN: 3:32 AM  CHIEF COMPLAINT: Fall  HPI: Pt is a 83 year old female with history of dementia, COPD, hypertension, hyperlipidemia who presents to the emergency department from [facility] nursing home with unwitnessed fall. Has obvious head injury. Has laceration to the right knee. Patient is at her mental baseline and unable to provide any history. She is not on anticoagulation.  ROS: level V caveat due to dementia  PAST MEDICAL HISTORY/PAST SURGICAL HISTORY:  Past Medical History:  Diagnosis Date  . Arthritis   . Chronic bronchitis (HCC)   . COPD (chronic obstructive pulmonary disease) (HCC)   . Dementia   . GERD (gastroesophageal reflux disease)   . Gout   . H/O hiatal hernia   . High cholesterol   . Hypertension     MEDICATIONS:  Prior to Admission medications   Medication Sig Start Date End Date Taking? Authorizing Provider  albuterol (PROVENTIL HFA;VENTOLIN HFA) 108 (90 Base) MCG/ACT inhaler Inhale 2 puffs into the lungs every 4 (four) hours as needed for wheezing or shortness of breath. 12/01/16   Payslee Bateson, Chase Picket, PA-C  allopurinol (ZYLOPRIM) 300 MG tablet Take 300 mg by mouth daily.     [provider]  atenolol (TENORMIN) 25 MG tablet Take 25 mg by mouth 2 (two) times daily.     [provider]  atorvastatin (LIPITOR) 20 MG tablet Take 20 mg by mouth at bedtime.    [provider]  docusate sodium (COLACE) 100 MG capsule Take 1 capsule (100 mg total) by mouth every 12 (twelve) hours. 03/16/17   Bethel Born, PA-C  ipratropium-albuterol (DUONEB) 0.5-2.5 (3) MG/3ML SOLN Take 3 mLs by nebulization every 6 (six) hours as needed. 12/01/16   Artavius Stearns, Chase Picket, PA-C  losartan (COZAAR) 100 MG tablet Take 100 mg by mouth daily.     [provider]  OLANZapine (ZYPREXA) 5 MG tablet Take 5 mg by mouth at bedtime. 07/11/16   [provider]  pantoprazole (PROTONIX) 40 MG tablet TAKE 1 TABLET BY MOUTH EVERY DAY 30-60 MINUTES  BEFORE FIRST MEAL OF THE DAY Patient taking differently: Take 40 mg by mouth once a day 10/22/15   Nyoka Cowden, MD  polyethylene glycol [facility]) packet Take 17 g by mouth daily. 03/16/17   Bethel Born, PA-C    ALLERGIES:  Allergies  Allergen Reactions  . Penicillins Rash    Tolerated ceftriaxone Has patient had a PCN reaction causing immediate rash, facial/tongue/throat swelling, SOB or lightheadedness with hypotension: Yes Has patient had a PCN reaction causing severe rash involving mucus membranes or skin necrosis: Unk Has patient had a PCN reaction that required hospitalization: Unk Has patient had a PCN reaction occurring within the last 10 years: Unk if all of the above answers are "NO", then may proceed with Cephalosporin use.     SOCIAL HISTORY:  Social History  Substance Use Topics  . Smoking status: Former Smoker    Packs/day: 0.50    Years: 70.00    Quit date: 08/18/2008  . Smokeless tobacco: Former Neurosurgeon    Quit date: 10/13/2004  . Alcohol use No    FAMILY HISTORY: Family History  Problem Relation Age of Onset  . Cancer Mother   . Stroke Father   . Cirrhosis Brother     EXAM: BP (!) 188/110 (BP Location: Left Arm)   Pulse 77   Temp 97.6 F (36.4 C) (Oral)   Resp 18   Ht  (1.676 m)   Wt  49.9 kg (110 lb)   SpO2 97%   BMI 17.75 kg/m  CONSTITUTIONAL: Alert and oriented to person but not place, time or situation. She is elderly, thin and chronically ill-appearing. HEAD: Normocephalic; hematoma noted to the forehead with multiple very superficial well approximate lacerations noted to the face and upper and lower lip EYES: Conjunctivae clear, PERRL, EOMI ENT: normal nose; no rhinorrhea; moist mucous membranes; pharynx without lesions noted; no dental injury;poor dentition, blood noted in both nostrils but no active epistaxis, no septal hematoma NECK: Supple, no meningismus, no LAD; no midline spinal tenderness, step-off or deformity; trachea midline,  cervical collar in place CARD: RRR; S1 and S2 appreciated; no murmurs, no clicks, no rubs, no gallops RESP: Normal chest excursion without splinting or tachypnea; breath sounds equal bilaterally but slightly diminished at bases with some extra wheezing on exam, no rhonchi, no rales; no hypoxia or respiratory distress CHEST:  chest wall stable, no crepitus or ecchymosis or deformity, nontender to palpation; no flail chest ABD/GI: Normal bowel sounds; non-distended; soft, non-tender, no rebound, no guarding; no ecchymosis or other lesions noted PELVIS:  stable, nontender to palpation BACK:  The back appears normal and is non-tender to palpation, there is no CVA tenderness; no midline spinal tenderness, step-off or deformity EXT: patient has a 4 cm laceration over the right knee that does not appear to compromised the joint. She has associated swelling and ecchymosis to this area but no obvious bony deformity.  Normal ROM in all joints; extremities are otherwisenon-tender to palpation; no edema; normal capillary refill; no cyanosis, no joint effusion, compartments are soft, extremities are warm and well-perfused SKIN: Normal color for age and race; warm NEURO: Moves all extremities equally, normal speech, no obvious facial asymmetry PSYCH: The patient's mood and manner are appropriate. Grooming and personal hygiene are appropriate.  MEDICAL DECISION MAKING: Pt here with unwitnessed fall.  Multiple facial lacerations that have been repaired with Dermabond. We will update her tetanus vaccination and also suture the laceration to her right knee. Will obtain CTs of her head, face and cervical spine. We'll obtain x-ray of the knee, chest and pelvis. Will check labs, urine.Will give Tylenol for discomfort.  ED PROGRESS: Pt's labs are unremarkable other than mild anemia with hemoglobin of 11.1 which is stable. Urine shows no sign of dehydration or infection.  CT of the head and cervical spine show no acute  injury. CT of the fascias acute nondisplaced bilateral nasal bone fractures with a nondisplaced fracture of the anterior nasal septum. No active epistaxis. No other acute maxillofacial injury.  Chest x-ray shows a small opacity in the peripheral right lung is likely scarring but infiltrate cannot be excluded. She has no cough, fever or leukocytosis. I doubt this is pneumonia. No rib fractures appreciated. X-ray of the pelvis and knee unremarkable. Laceration on the knee has been repaired. Cervical collar has been removed. She is no longer wheezing. Blood pressure is improving. She is resting comfortably. I feel she is safe for discharge back to her nursing facility.    At this time, I do not feel there is any life-threatening condition present. I have reviewed and discussed all results (EKG, imaging, lab, urine as appropriate) and exam findings with patient/family. I have reviewed nursing notes and appropriate previous records.  I feel the patient is safe to be discharged home without further emergent workup and can continue workup as an outpatient as needed. Discussed usual and customary return precautions. Patient/family verbalize understanding and are comfortable with  this plan.  Outpatient follow-up has been provided if needed. All questions have been answered.    LACERATION REPAIR Performed by: Raelyn Number Authorized by: Raelyn Number Consent: Verbal consent obtained. Risks and benefits: risks, benefits and alternatives were discussed Consent given by: patient Patient identity confirmed: provided demographic data Prepped and Draped in normal sterile fashion Wound explored  Laceration Location: Multiple small lacerations noted of patient's face  Laceration Length: all less than 1 cm  No Foreign Bodies seen or palpated  Anesthesia: none  Local anesthetic: none  Anesthetic total: 0 ml  Irrigation method: clean with soap and warm water Amount of cleaning: standard  Skin  closure: using Dermabond  Number of sutures: 0  Technique: Area cleaned with warm soap and water. Multiple lacerations very superficial and less than 1 cm. Used Dermabond for wound closure.  Patient tolerance: Patient tolerated the procedure well with no immediate complications.     Jp Eastham, Layla Maw, DO 06/02/17 [phone]

## 2017-06-02 NOTE — ED Notes (Signed)
Bed: IO96 Expected date:  Expected time:  Means of arrival:  Comments: 83 yo F/ fall

## 2017-06-11 ENCOUNTER — Inpatient Hospital Stay (HOSPITAL_COMMUNITY)
Admission: EM | Admit: 2017-06-11 | Discharge: 2017-06-15 | DRG: 602 | Disposition: A | Discharge status: Skilled Nursing Facility | Payer: Medicare Other | Attending: Internal Medicine | Admitting: Internal Medicine

## 2017-06-11 ENCOUNTER — Encounter (HOSPITAL_COMMUNITY): Payer: Self-pay | Admitting: Nurse Practitioner

## 2017-06-11 DIAGNOSIS — I10 Essential (primary) hypertension: Secondary | ICD-10-CM | POA: Diagnosis present

## 2017-06-11 DIAGNOSIS — Z9842 Cataract extraction status, left eye: Secondary | ICD-10-CM

## 2017-06-11 DIAGNOSIS — R609 Edema, unspecified: Secondary | ICD-10-CM

## 2017-06-11 DIAGNOSIS — M7989 Other specified soft tissue disorders: Secondary | ICD-10-CM | POA: Diagnosis not present

## 2017-06-11 DIAGNOSIS — L02419 Cutaneous abscess of limb, unspecified: Secondary | ICD-10-CM

## 2017-06-11 DIAGNOSIS — N179 Acute kidney failure, unspecified: Secondary | ICD-10-CM | POA: Diagnosis present

## 2017-06-11 DIAGNOSIS — Z681 Body mass index (BMI) 19 or less, adult: Secondary | ICD-10-CM

## 2017-06-11 DIAGNOSIS — F0390 Unspecified dementia without behavioral disturbance: Secondary | ICD-10-CM | POA: Diagnosis present

## 2017-06-11 DIAGNOSIS — K219 Gastro-esophageal reflux disease without esophagitis: Secondary | ICD-10-CM | POA: Diagnosis present

## 2017-06-11 DIAGNOSIS — Z9841 Cataract extraction status, right eye: Secondary | ICD-10-CM | POA: Diagnosis not present

## 2017-06-11 DIAGNOSIS — Z79899 Other long term (current) drug therapy: Secondary | ICD-10-CM

## 2017-06-11 DIAGNOSIS — Z961 Presence of intraocular lens: Secondary | ICD-10-CM | POA: Diagnosis present

## 2017-06-11 DIAGNOSIS — L02416 Cutaneous abscess of left lower limb: Principal | ICD-10-CM | POA: Diagnosis present

## 2017-06-11 DIAGNOSIS — M109 Gout, unspecified: Secondary | ICD-10-CM | POA: Diagnosis present

## 2017-06-11 DIAGNOSIS — Z87891 Personal history of nicotine dependence: Secondary | ICD-10-CM | POA: Diagnosis not present

## 2017-06-11 DIAGNOSIS — J449 Chronic obstructive pulmonary disease, unspecified: Secondary | ICD-10-CM | POA: Diagnosis present

## 2017-06-11 DIAGNOSIS — E7800 Pure hypercholesterolemia, unspecified: Secondary | ICD-10-CM | POA: Diagnosis present

## 2017-06-11 DIAGNOSIS — E43 Unspecified severe protein-calorie malnutrition: Secondary | ICD-10-CM | POA: Diagnosis present

## 2017-06-11 DIAGNOSIS — R001 Bradycardia, unspecified: Secondary | ICD-10-CM | POA: Diagnosis present

## 2017-06-11 DIAGNOSIS — Z880 Allergy status to penicillin: Secondary | ICD-10-CM | POA: Diagnosis not present

## 2017-06-11 LAB — CBC WITH DIFFERENTIAL/PLATELET
Basophils Absolute: 0 K/uL (ref 0.0–0.1)
Basophils Relative: 0 %
Eosinophils Absolute: 0.3 K/uL (ref 0.0–0.7)
Eosinophils Relative: 4 %
HCT: 31.3 % — ABNORMAL LOW (ref 36.0–46.0)
Hemoglobin: 10.2 g/dL — ABNORMAL LOW (ref 12.0–15.0)
Lymphocytes Relative: 26 %
Lymphs Abs: 2 K/uL (ref 0.7–4.0)
MCH: 28.9 pg (ref 26.0–34.0)
MCHC: 32.6 g/dL (ref 30.0–36.0)
MCV: 88.7 fL (ref 78.0–100.0)
Monocytes Absolute: 0.4 K/uL (ref 0.1–1.0)
Monocytes Relative: 5 %
Neutro Abs: 4.9 K/uL (ref 1.7–7.7)
Neutrophils Relative %: 65 %
Platelets: 358 K/uL (ref 150–400)
RBC: 3.53 MIL/uL — ABNORMAL LOW (ref 3.87–5.11)
RDW: 14.1 % (ref 11.5–15.5)
WBC: 7.5 K/uL (ref 4.0–10.5)

## 2017-06-11 LAB — I-STAT CHEM 8, ED
BUN: 38 mg/dL — ABNORMAL HIGH (ref 6–20)
CALCIUM ION: 1.27 mmol/L (ref 1.15–1.40)
CREATININE: 1.2 mg/dL — AB (ref 0.44–1.00)
Chloride: 106 mmol/L (ref 101–111)
Glucose, Bld: 74 mg/dL (ref 65–99)
HEMATOCRIT: 28 % — AB (ref 36.0–46.0)
HEMOGLOBIN: 9.5 g/dL — AB (ref 12.0–15.0)
Potassium: 4.3 mmol/L (ref 3.5–5.1)
SODIUM: 140 mmol/L (ref 135–145)
TCO2: 28 mmol/L (ref 22–32)

## 2017-06-11 LAB — I-STAT CG4 LACTIC ACID, ED
Lactic Acid, Venous: 0.65 mmol/L (ref 0.5–1.9)
Lactic Acid, Venous: 0.73 mmol/L (ref 0.5–1.9)

## 2017-06-11 MED ORDER — OLANZAPINE 5 MG PO TABS
5.0000 mg | ORAL_TABLET | Freq: Every day | ORAL | Status: DC
  Administered 2017-06-11 – 2017-06-14 (×4): 5 mg via ORAL
  Filled 2017-06-11 (×4): qty 1

## 2017-06-11 MED ORDER — ATORVASTATIN CALCIUM 20 MG PO TABS
20.0000 mg | ORAL_TABLET | Freq: Every day | ORAL | Status: DC
  Administered 2017-06-12 – 2017-06-14 (×3): 20 mg via ORAL
  Filled 2017-06-11 (×3): qty 1

## 2017-06-11 MED ORDER — VANCOMYCIN HCL IN DEXTROSE 750-5 MG/150ML-% IV SOLN
750.0000 mg | INTRAVENOUS | Status: DC
  Filled 2017-06-11: qty 150

## 2017-06-11 MED ORDER — IPRATROPIUM-ALBUTEROL 0.5-2.5 (3) MG/3ML IN SOLN
3.0000 mL | Freq: Four times a day (QID) | RESPIRATORY_TRACT | Status: DC | PRN
  Administered 2017-06-12: 3 mL via RESPIRATORY_TRACT
  Filled 2017-06-11: qty 3

## 2017-06-11 MED ORDER — ALLOPURINOL 300 MG PO TABS
300.0000 mg | ORAL_TABLET | Freq: Every day | ORAL | Status: DC
  Administered 2017-06-11 – 2017-06-15 (×5): 300 mg via ORAL
  Filled 2017-06-11 (×5): qty 1

## 2017-06-11 MED ORDER — DOCUSATE SODIUM 100 MG PO CAPS
100.0000 mg | ORAL_CAPSULE | Freq: Two times a day (BID) | ORAL | Status: DC
  Administered 2017-06-11 – 2017-06-15 (×8): 100 mg via ORAL
  Filled 2017-06-11 (×8): qty 1

## 2017-06-11 MED ORDER — ENOXAPARIN SODIUM 30 MG/0.3ML ~~LOC~~ SOLN
30.0000 mg | SUBCUTANEOUS | Status: DC
  Administered 2017-06-11 – 2017-06-14 (×4): 30 mg via SUBCUTANEOUS
  Filled 2017-06-11 (×4): qty 0.3

## 2017-06-11 MED ORDER — SODIUM CHLORIDE 0.9 % IV SOLN
INTRAVENOUS | Status: DC
  Administered 2017-06-11 – 2017-06-12 (×3): via INTRAVENOUS

## 2017-06-11 MED ORDER — PANTOPRAZOLE SODIUM 40 MG PO TBEC
40.0000 mg | DELAYED_RELEASE_TABLET | Freq: Every day | ORAL | Status: DC
  Administered 2017-06-12 – 2017-06-15 (×4): 40 mg via ORAL
  Filled 2017-06-11 (×4): qty 1

## 2017-06-11 MED ORDER — POLYETHYLENE GLYCOL 3350 17 G PO PACK
17.0000 g | PACK | Freq: Every day | ORAL | Status: DC
  Administered 2017-06-12 – 2017-06-15 (×4): 17 g via ORAL
  Filled 2017-06-11 (×4): qty 1

## 2017-06-11 MED ORDER — VANCOMYCIN HCL IN DEXTROSE 1-5 GM/200ML-% IV SOLN
1000.0000 mg | Freq: Once | INTRAVENOUS | Status: AC
  Administered 2017-06-11: 1000 mg via INTRAVENOUS
  Filled 2017-06-11: qty 200

## 2017-06-11 MED ORDER — ALBUTEROL SULFATE (2.5 MG/3ML) 0.083% IN NEBU
3.0000 mL | INHALATION_SOLUTION | RESPIRATORY_TRACT | Status: DC | PRN
  Administered 2017-06-12 – 2017-06-13 (×2): 3 mL via RESPIRATORY_TRACT
  Filled 2017-06-11 (×2): qty 3

## 2017-06-11 NOTE — Progress Notes (Signed)
A consult was received from an ED physician for Vancomycin per pharmacy dosing.  The patient's profile has been reviewed for ht/wt/allergies/indication/available labs.   A one time order has been placed for Vancomycin 1gm.  Further antibiotics/pharmacy consults should be ordered by admitting physician if indicated.                       Thank you, Elson ClanLilliston, Felice Hope Michelle 06/11/2017  4:05 PM

## 2017-06-11 NOTE — ED Provider Notes (Addendum)
[facility]-EMERGENCY DEPT Provider Note   CSN: [id number] Arrival date & time: 06/11/17  1150     History   Chief Complaint No chief complaint on file.   HPI [name] is a 83 y.o. female.  HPI Patient presents with left knee infection.  Presents from nursing home.  She had a fall 9 days ago and was seen in the ER and had a laceration that was repaired.  Sutures have since been removed.  Now has more redness and swelling with purulent drainage of the wound.  Denies fevers.  Has some pain in the knee but otherwise doing well.  Has not been on antibiotics. Past Medical History:  Diagnosis Date  . Arthritis   . Chronic bronchitis (HCC)   . COPD (chronic obstructive pulmonary disease) (HCC)   . Dementia   . GERD (gastroesophageal reflux disease)   . Gout   . H/O hiatal hernia   . High cholesterol   . Hypertension     Patient Active Problem List   Diagnosis Date Noted  . Failure to thrive in adult 04/15/2016  . Acute encephalopathy 04/15/2016  . GERD (gastroesophageal reflux disease) 04/15/2016  . Orthostatic hypotension 03/18/2016  . Orthostatic dizziness 03/18/2016  . Postinflammatory pulmonary fibrosis (HCC) 04/29/2015  . Dyspnea 04/26/2015  . Severe malnutrition (HCC) 02/23/2015  . AKI (acute kidney injury) (HCC) 02/22/2015  . Arthritis 02/22/2015  . Weakness   . Dehydration 02/21/2015  . ARF (acute renal failure) (HCC) 10/03/2014  . Underweight 07/20/2014  . Acute bronchitis 07/20/2014  . COPD GOLD I with reversibility  07/18/2014  . Essential hypertension 07/18/2014  . Gout 07/18/2014  . SBO (small bowel obstruction) (HCC) 01/14/2014  . Hypercalcemia 01/14/2014  . Protein-calorie malnutrition, severe (HCC) 10/10/2013  . Acute respiratory failure (HCC) 10/07/2013  . Hypokalemia 10/07/2013  . H. influenzae septicemia (HCC) 12/06/2012  . Leukocytopenia, unspecified 12/03/2012  . HTN (hypertension) 12/02/2012  . Gouty arthritis  12/02/2012    Past Surgical History:  Procedure Laterality Date  . APPENDECTOMY  1945  . CATARACT EXTRACTION W/ INTRAOCULAR LENS  IMPLANT, BILATERAL Bilateral   . FRACTURE SURGERY    . HIP FRACTURE SURGERY     "don't remember which side"  . TEE WITHOUT CARDIOVERSION N/A 10/13/2013   Procedure: TRANSESOPHAGEAL ECHOCARDIOGRAM (TEE);  Surgeon: Quintella Reichertraci R Turner, MD;  [facility];  Service: Cardiovascular;  Laterality: N/A;    OB History    No data available       Home Medications    Prior to Admission medications   Medication Sig Start Date End Date Taking? Authorizing Provider  albuterol (PROVENTIL HFA;VENTOLIN HFA) 108 (90 Base) MCG/ACT inhaler Inhale 2 puffs into the lungs every 4 (four) hours as needed for wheezing or shortness of breath. 12/01/16   Ward, Chase PicketJaime Pilcher, PA-C  allopurinol (ZYLOPRIM) 300 MG tablet Take 300 mg by mouth daily.     [provider]  atenolol (TENORMIN) 25 MG tablet Take 25 mg by mouth 2 (two) times daily.     [provider]  atorvastatin (LIPITOR) 20 MG tablet Take 20 mg by mouth at bedtime.    [provider]  docusate sodium (COLACE) 100 MG capsule Take 1 capsule (100 mg total) by mouth every 12 (twelve) hours. 03/16/17   Bethel BornGekas, Kelly [name], PA-C  ipratropium-albuterol (DUONEB) 0.5-2.5 (3) MG/3ML SOLN Take 3 mLs by nebulization every 6 (six) hours as needed. 12/01/16   Ward, Chase PicketJaime Pilcher, PA-C  losartan (COZAAR) 100  MG tablet Take 100 mg by mouth daily.     [provider]  OLANZapine (ZYPREXA) 5 MG tablet Take 5 mg by mouth at bedtime. 07/11/16   [provider]  pantoprazole (PROTONIX) 40 MG tablet TAKE 1 TABLET BY MOUTH EVERY DAY 30-60 MINUTES BEFORE FIRST MEAL OF THE DAY Patient taking differently: Take 40 mg by mouth once a day 10/22/15   Nyoka Cowden, MD  polyethylene glycol [facility]) packet Take 17 g by mouth daily. 03/16/17   Bethel Born, PA-C    Family History Family History    Problem Relation Age of Onset  . Cancer Mother   . Stroke Father   . Cirrhosis Brother     Social History Social History  Substance Use Topics  . Smoking status: Former Smoker    Packs/day: 0.50    Years: 70.00    Quit date: 08/18/2008  . Smokeless tobacco: Former Neurosurgeon    Quit date: 10/13/2004  . Alcohol use No     Allergies   Penicillins   Review of Systems Review of Systems  Constitutional: Negative for appetite change, chills and fever.  Respiratory: Negative for shortness of breath.   Cardiovascular: Negative for chest pain.  Gastrointestinal: Negative for abdominal pain.  Genitourinary: Negative for flank pain.  Musculoskeletal: Negative for back pain.  Skin: Positive for wound.  Neurological: Negative for weakness.     Physical Exam Updated Vital Signs BP (!) 110/43 (BP Location: Right Arm)   Pulse (!) 58   Temp 97.7 F (36.5 C) (Oral)   Resp 16   Ht 5\' 6"  (1.676 m)   Wt 49.9 kg (110 lb)   SpO2 97%   BMI 17.75 kg/m   Physical Exam  Constitutional: She appears well-developed.  HENT:  Head: Atraumatic.  Eyes: Pupils are equal, round, and reactive to light.  Neck: Neck supple.  Cardiovascular: Normal rate.   Pulmonary/Chest: Effort normal.  Abdominal: There is no tenderness.  Musculoskeletal: She exhibits tenderness.  Erythema and swelling anterior of the left knee.  There is a horizontal healing laceration.  On the medial and lateral aspects of it there is small areas of dehiscence with purulent drainage.  There is lateral fluctuance.  Pressure on this expresses more purulence.  All purulence appears to have been expressed with pressure.  Skin: Skin is warm. Capillary refill takes less than 2 seconds.       ED Treatments / Results  Labs (all labs ordered are listed, but only abnormal results are displayed) Labs Reviewed  CBC WITH DIFFERENTIAL/PLATELET - Abnormal; Notable for the following:       Result Value   RBC 3.53 (*)    Hemoglobin 10.2  (*)    HCT 31.3 (*)    All other components within normal limits  I-STAT CHEM 8, ED - Abnormal; Notable for the following:    BUN 38 (*)    Creatinine, Ser 1.20 (*)    Hemoglobin 9.5 (*)    HCT 28.0 (*)    All other components within normal limits  AEROBIC CULTURE (SUPERFICIAL SPECIMEN)  I-STAT CG4 LACTIC ACID, ED  I-STAT CG4 LACTIC ACID, ED    EKG  EKG Interpretation None       Radiology No results found.  Procedures Procedures (including critical care time)  Medications Ordered in ED Medications  vancomycin (VANCOCIN) IVPB 1000 mg/200 mL premix (not administered)     Initial Impression / Assessment and Plan / ED Course  I have reviewed  the triage vital signs and the nursing notes.  Pertinent labs & imaging results that were available during my care of the patient were reviewed by me and considered in my medical decision making (see chart for details).     Patient with abscess on the front of the left knee.  Does not appear to involve deep structures.  There is has been some return of the fluctuance while in the ER.  Initial Gram stain shows gram-positive cocci.  Labs reassuring however with the patient's age and comorbidities I feel she would benefit from observation with short course of IV antibiotics.  Final Clinical Impressions(s) / ED Diagnoses   Final diagnoses:  Abscess of knee    New Prescriptions New Prescriptions   No medications on file     Benjiman Core, MD 06/11/17 1604    Benjiman Core, MD 06/11/17 [phone]

## 2017-06-11 NOTE — ED Notes (Signed)
Transport took patient to floor

## 2017-06-11 NOTE — Progress Notes (Signed)
Pharmacy Antibiotic Note  [name] is a 83 y.o. female admitted on 06/11/2017 with abscess of left knee.  She fell 9 days ago with laceration to this area.  Sutures removed, but knee has worsening erythema, edema, and warmth to touch. S/P I&D in ED.  Pharmacy has been consulted for Vancomycin dosing.  She received first dose in ED. 06/11/2017:  Afebrile  No leukocytosis, LA WNL  Mildly elevated Scr (baseline ~0.9)  Gram stain from I&D + [facility]  Plan: Vancomycin 750mg  IV q48h- next dose due 10/27 Monitor renal function and cx data   Height: 5\' 6"  (167.6 cm) Weight: 110 lb (49.9 kg) IBW/kg (Calculated) : 59.3  Temp (24hrs), Avg:97.7 F (36.5 C), Min:97.7 F (36.5 C), Max:97.7 F (36.5 C)   Recent Labs Lab 06/11/17 1353 06/11/17 1406 06/11/17 1417 06/11/17 1452  WBC 7.5  --   --   --   CREATININE  --   --   --  1.20*  LATICACIDVEN  --  0.65 0.73  --     Estimated Creatinine Clearance: 23.6 mL/min (A) (by C-G formula based on SCr of 1.2 mg/dL (H)).    Allergies  Allergen Reactions  . Penicillins Rash    Tolerated ceftriaxone Has patient had a PCN reaction causing immediate rash, facial/tongue/throat swelling, SOB or lightheadedness with hypotension: Yes Has patient had a PCN reaction causing severe rash involving mucus membranes or skin necrosis: Unk Has patient had a PCN reaction that required hospitalization: Unk Has patient had a PCN reaction occurring within the last 10 years: Unk if all of the above answers are "NO", then may proceed with Cephalosporin use.     Antimicrobials this admission: 10/25 Vancomycin >>   Dose adjustments this admission:  Microbiology results: 10/25 Abscess, soft tissue: GPC on gram stain  Thank you for allowing pharmacy to be a part of this patient's care.  Elson ClanLilliston, Jerauld Bostwick Michelle 06/11/2017 6:38 PM

## 2017-06-11 NOTE — ED Notes (Signed)
Report given to floor RN

## 2017-06-11 NOTE — ED Triage Notes (Signed)
Patient coming from [facility]. Patients left knee red swollen.

## 2017-06-11 NOTE — H&P (Signed)
History and Physical    Lindsay HammingCallie B Cosby ZOX:[id number] DOB: 02/10/1923 DOA: 06/11/2017  Referring MD/NP/PA: Dr. Rubin PayorPickering   PCP: [name], [name], MD   Patient coming from: Skilled nursing facility  Chief Complaint: left knee swelling   HPI: [name] is a 83 y.o. female with medical history significant of dementia, COPD, hypertension, who presented from skilled nursing facility for evaluation of progressively worsening left knee swelling. Please note that patient is not able to provide detailed history due to known dementia and therefore most of the information obtained from available records from emergency room physician as well as skilled nursing facility. Patient apparently fell 9 days prior to this admission and was seen in the emergency department, had a laceration that was repaired. Sutures have since been removed but patient has started to develop progressively worsening erythema, edema, warmth to touch in the left knee. This was associated with drainage. No reports of fevers or chills.  ED Course: In emergency department, patient noted to be hemodynamically stable, vital signs stable except mild bradycardia heart rate 58, blood work notable for hemoglobin 9.5, creatinine 1.2. Emergency room physician drain ed left knee abscess, provided dose of vancomycin and ask TRH to admit for further management.  Review of Systems:  Unable to obtain review of systems due to dementia  Past Medical History:  Diagnosis Date  . Arthritis   . Chronic bronchitis (HCC)   . COPD (chronic obstructive pulmonary disease) (HCC)   . Dementia   . GERD (gastroesophageal reflux disease)   . Gout   . H/O hiatal hernia   . High cholesterol   . Hypertension     Past Surgical History:  Procedure Laterality Date  . APPENDECTOMY  1945  . CATARACT EXTRACTION W/ INTRAOCULAR LENS  IMPLANT, BILATERAL Bilateral   . FRACTURE SURGERY    . HIP FRACTURE SURGERY     "don't remember which side"  . TEE WITHOUT  CARDIOVERSION N/A 10/13/2013   Procedure: TRANSESOPHAGEAL ECHOCARDIOGRAM (TEE);  Surgeon: Quintella Reichertraci R Turner, MD;  [facility];  Service: Cardiovascular;  Laterality: N/A;   Social history  reports that she quit smoking about 8 years ago. She has a 35.00 pack-year smoking history. She quit smokeless tobacco use about 12 years ago. She reports that she does not drink alcohol or use drugs.  Allergies  Allergen Reactions  . Penicillins Rash    Tolerated ceftriaxone Has patient had a PCN reaction causing immediate rash, facial/tongue/throat swelling, SOB or lightheadedness with hypotension: Yes Has patient had a PCN reaction causing severe rash involving mucus membranes or skin necrosis: Unk Has patient had a PCN reaction that required hospitalization: Unk Has patient had a PCN reaction occurring within the last 10 years: Unk if all of the above answers are "NO", then may proceed with Cephalosporin use.     Family History  Problem Relation Age of Onset  . Cancer Mother   . Stroke Father   . Cirrhosis Brother     Prior to Admission medications   Medication Sig Start Date End Date Taking? Authorizing Provider  albuterol (PROVENTIL HFA;VENTOLIN HFA) 108 (90 Base) MCG/ACT inhaler Inhale 2 puffs into the lungs every 4 (four) hours as needed for wheezing or shortness of breath. 12/01/16   Ward, Chase PicketJaime Pilcher, PA-C  allopurinol (ZYLOPRIM) 300 MG tablet Take 300 mg by mouth daily.     [provider]  atenolol (TENORMIN) 25 MG tablet Take 25 mg by mouth 2 (two) times daily.  [provider]  atorvastatin (LIPITOR) 20 MG tablet Take 20 mg by mouth at bedtime.    [provider]  docusate sodium (COLACE) 100 MG capsule Take 1 capsule (100 mg total) by mouth every 12 (twelve) hours. 03/16/17   Bethel Born, PA-C  ipratropium-albuterol (DUONEB) 0.5-2.5 (3) MG/3ML SOLN Take 3 mLs by nebulization every 6 (six) hours as needed. 12/01/16   Ward, Chase Picket, PA-C    losartan (COZAAR) 100 MG tablet Take 100 mg by mouth daily.     [provider]  OLANZapine (ZYPREXA) 5 MG tablet Take 5 mg by mouth at bedtime. 07/11/16   [provider]  pantoprazole (PROTONIX) 40 MG tablet TAKE 1 TABLET BY MOUTH EVERY DAY 30-60 MINUTES BEFORE FIRST MEAL OF THE DAY Patient taking differently: Take 40 mg by mouth once a day 10/22/15   Nyoka Cowden, MD  polyethylene glycol [facility]) packet Take 17 g by mouth daily. 03/16/17   Bethel Born, PA-C    Physical Exam: Vitals:   06/11/17 1157 06/11/17 1207  BP:  (!) 110/43  Pulse:  (!) 58  Resp:  16  Temp:  97.7 F (36.5 C)  TempSrc:  Oral  SpO2:  97%  Weight: 49.9 kg (110 lb)   Height: 5\' 6"  (1.676 m)     Constitutional: NAD, calm, comfortable, cachectic Vitals:   06/11/17 1157 06/11/17 1207  BP:  (!) 110/43  Pulse:  (!) 58  Resp:  16  Temp:  97.7 F (36.5 C)  TempSrc:  Oral  SpO2:  97%  Weight: 49.9 kg (110 lb)   Height: 5\' 6"  (1.676 m)    Eyes: PERRL, lids and conjunctivae normal ENMT: Mucous membranes are dry. Posterior pharynx clear of any exudate or lesions.Normal dentition.  Neck: normal, supple, no masses, no thyromegaly Respiratory: clear to auscultation bilaterally, no wheezing, no crackles. Normal respiratory effort. No accessory muscle use.  Cardiovascular: Bradycardic, no rubs / gallops. No extremity edema. 2+ pedal pulses. No carotid bruits.  Abdomen: no tenderness, no masses palpated. No hepatosplenomegaly. Bowel sounds positive.  Musculoskeletal: no clubbing / cyanosis. No joint deformity upper and lower extremities. Left knee swelling with edema and erythema, warmth and tenderness to palpation, draining yellow pus Skin: Edema and erythema around the left knee area Neurologic: CN 2-12 grossly intact. Sensation intact, DTR normal. Psychiatric: Pleasantly confused   Labs on Admission: I have personally reviewed following labs and imaging studies  CBC:  Recent  Labs Lab 06/11/17 1353 06/11/17 1452  WBC 7.5  --   NEUTROABS 4.9  --   HGB 10.2* 9.5*  HCT 31.3* 28.0*  MCV 88.7  --   PLT 358  --    Basic Metabolic Panel:  Recent Labs Lab 06/11/17 1452  NA 140  K 4.3  CL 106  GLUCOSE 74  BUN 38*  CREATININE 1.20*   Urine analysis:    Component Value Date/Time   COLORURINE STRAW (A) 06/02/2017 0554   APPEARANCEUR CLEAR 06/02/2017 0554   LABSPEC 1.009 06/02/2017 0554   PHURINE 7.0 06/02/2017 0554   GLUCOSEU 50 (A) 06/02/2017 0554   HGBUR NEGATIVE 06/02/2017 0554   BILIRUBINUR NEGATIVE 06/02/2017 0554   KETONESUR NEGATIVE 06/02/2017 0554   PROTEINUR NEGATIVE 06/02/2017 0554   UROBILINOGEN 0.2 02/21/2015 2337   NITRITE NEGATIVE 06/02/2017 0554   LEUKOCYTESUR NEGATIVE 06/02/2017 0554    Recent Results (from the past 240 hour(s))  Wound or Superficial Culture     Status: None (Preliminary result)  Collection Time: 06/11/17 12:56 PM  Result Value Ref Range Status   Specimen Description ABSCESS SOFT TISSUE  Final   Special Requests NONE  Final   Gram Stain   Final    FEW WBC PRESENT, PREDOMINANTLY PMN MODERATE GRAM POSITIVE COCCI Performed at [facility], [address] [place], [place] [address]    Culture PENDING  Incomplete   Report Status PENDING  Incomplete     Radiological Exams on Admission: No results found.  EKG: Pending  Assessment/Plan Active Problems:   Abscess of left knee  - drained in the ER - Agree with starting vancomycin - Continue to monitor response to antibiotic - May need another drainage if not improving in next 24-48 hours - Daily wound care and dressing changes    COPD - Stable respiratory status - Continue preadmission regimen with bronchodilators as needed    Hypertension - Hold atenolol due to mild bradycardia - Hold losartan until renal function stabilizes - Blood pressure currently stable so can wait to restart antihypertensive regimen    Bradycardia - Mild, holding  atenolol as noted above    Acute kidney injury - Prerenal in etiology, we'll provide IV fluids and repeat BMP in the morning - Hold losartan as noted above    Underweight - Body mass index is 17.75 kg/m.   DVT prophylaxis: Lovenox SQ Code Status: Full Family Communication: Pt updated at bedside, no family at bedside Disposition Plan: Admit to medical unit Consults called: None  Admission status: Inpatient  Debbora Presto MD [facility] Pager [phone]  If 7PM-7AM, please contact night-coverage [email or web address] Password [facility]  06/11/2017, 4:54 PM

## 2017-06-11 NOTE — ED Notes (Signed)
Bed: WA21 Expected date:  Expected time:  Means of arrival:  Comments: 83 yo f knee swelling

## 2017-06-12 LAB — CBC
HCT: 34.1 % — ABNORMAL LOW (ref 36.0–46.0)
Hemoglobin: 10.9 g/dL — ABNORMAL LOW (ref 12.0–15.0)
MCH: 28.2 pg (ref 26.0–34.0)
MCHC: 32 g/dL (ref 30.0–36.0)
MCV: 88.3 fL (ref 78.0–100.0)
PLATELETS: 340 10*3/uL (ref 150–400)
RBC: 3.86 MIL/uL — AB (ref 3.87–5.11)
RDW: 14 % (ref 11.5–15.5)
WBC: 8.1 10*3/uL (ref 4.0–10.5)

## 2017-06-12 LAB — BASIC METABOLIC PANEL
Anion gap: 9 (ref 5–15)
BUN: 37 mg/dL — AB (ref 6–20)
CALCIUM: 9.1 mg/dL (ref 8.9–10.3)
CHLORIDE: 109 mmol/L (ref 101–111)
CO2: 23 mmol/L (ref 22–32)
CREATININE: 1.04 mg/dL — AB (ref 0.44–1.00)
GFR calc Af Amer: 52 mL/min — ABNORMAL LOW (ref >60)
GFR calc non Af Amer: 45 mL/min — ABNORMAL LOW (ref >60)
GLUCOSE: 94 mg/dL (ref 65–99)
Potassium: 4.1 mmol/L (ref 3.5–5.1)
Sodium: 141 mmol/L (ref 135–145)

## 2017-06-12 MED ORDER — ENSURE ENLIVE PO LIQD
237.0000 mL | Freq: Three times a day (TID) | ORAL | Status: DC
  Administered 2017-06-12 – 2017-06-15 (×7): 237 mL via ORAL

## 2017-06-12 NOTE — Clinical Social Work Note (Addendum)
Clinical Social Work Assessment  Patient Details  Name: [name] MRN: [id number] Date of Birth: 10-04-1922  Date of referral:  06/12/17               Reason for consult:   (pt from facility)                Permission sought to share information with:  Family Supports, Chartered certified accountant granted to share information::  Yes, Verbal Permission Granted  Name::     neice Tiffany  Agency::  [employer]  Relationship::     Contact Information:     Housing/Transportation Living arrangements for the past 2 months:  [place] of Information:  Facility Patient Interpreter Needed:  None Criminal Activity/Legal Involvement Pertinent to Current Situation/Hospitalization:  No - Comment as needed Significant Relationships:  Warehouse manager, Other Family Members Lives with:  Facility Resident Do you feel safe going back to the place where you live?  Yes Need for family participation in patient care:  No (Coment)  Care giving concerns:  Pt from [facility]- memory care. Pt denies caregiving concerns. Facility staff reports pt has PT/OT at facility, needs assistance with bathing and is able to manually eat independently but needs some cues to remember to feed self. States pt is typically ambulatory with some assistance and fall was isolated incident.   Social Worker assessment / plan:  CSW consulted as pt is from facility- [facility]. Met with pt at bedside- she was alert and oriented to person and place, however unable to state her situation, time, history, care needs, etc. Noted dementia in chart.   Pt did advise CSW could speak with family- spoke with niece (actually pt is her godmother not blood relative)- she is aware of pt's admission. She states she arranged pt's moving to [facility]. States pt has a blood-relative niece in [place] but otherwise no family. Niece states she and facility have  discussed pt's care and both feel she can return to [facility] [place], feel no higher level of care will be needed from PT standpoint. (Would consider if there is need for IV abx, however). Pt's niece explains she is Occupational hygienist and is familiar with facility levels of care.  Spoke with [facility] who confirms pt resident- has lived there approximately 3 months (5 months per niece) and doing well. Was at home prior to that, did not have details. Review of chart indicates pt lived with family in past and went to SNF for rehab in 2017.  Plan: to return to [facility] [place] unless level of care needs change.   Employment status:  Retired Forensic scientist:  Medicare PT Recommendations:  Not assessed at this time Information / Referral to community resources:     Patient/Family's Response to care:  Pt disoriented. Does not respond re: care  Patient/Family's Understanding of and Emotional Response to Diagnosis, Current Treatment, and Prognosis:  Pt unable to demonstrate understanding of her treatment/prognosis and unable to have meaningful conversation with CSW re: her care. Emotionally was calm and pleasant but mental status confused. States, "Will I live at the hospital now? Does my family live here?"  Emotional Assessment Appearance:  Appears stated age Attitude/Demeanor/Rapport:   (drowsy, pleasant, confused) Affect (typically observed):  Calm Orientation:  Oriented to Self, Oriented to Place Alcohol / Substance use:  Not Applicable Psych involvement (Current and /or in the community):  No (Comment)  Discharge  Needs  Concerns to be addressed:  Discharge Planning Concerns Readmission within the last 30 days:  No Current discharge risk:   (still assessing ) Barriers to Discharge:  Continued Medical Work up   [employer], LCSW 06/12/2017, 11:28 AM  [phone]

## 2017-06-12 NOTE — Progress Notes (Signed)
Initial Nutrition Assessment  DOCUMENTATION CODES:   Underweight, Severe malnutrition in context of chronic illness  INTERVENTION:   Ensure Enlive po TID, each supplement provides 350 kcal and 20 grams of protein   NUTRITION DIAGNOSIS:   Severe Malnutrition related to chronic illness (dementia) as evidenced by severe fat depletion, severe muscle depletion.  GOAL:   Patient will meet greater than or equal to 90% of their needs  MONITOR:   PO intake, Supplement acceptance, Labs, Weight trends  REASON FOR ASSESSMENT:   Other (Comment) (Low BMI)    ASSESSMENT:   Pt with PMH significant for COPD, dementia, and HTN. Presents this admission with abscess of left knee (driained in ER).   Pt lethargic upon visit. No family at bedside to obtain nutrition history. Spoke with RN who reports pt has good appetite this admission. Meal completions charted as 75% average of last two meals.   Records indicate pt's weight has stayed between 100-110 lb since 2016. Pt had weight loss in May 2018 and has since gained to her normal weight.   Nutrition-Focused physical exam completed. Findings are severe fat depletion, severe muscle depletion, and no edema. Pt shows severe depletions consistent with malnutrition will try to obtain intake information at follow up.   Medications reviewed and include: NS @ 75 ml/hr, IV abx Labs reviewed: BUN 37 (H) Creatinine 1.04 (H)  NUTRITION - FOCUSED PHYSICAL EXAM:    Most Recent Value  Orbital Region  Moderate depletion  Upper Arm Region  Severe depletion  Thoracic and Lumbar Region  Unable to assess  Buccal Region  Severe depletion  Temple Region  Severe depletion  Clavicle Bone Region  Severe depletion  Clavicle and Acromion Bone Region  Severe depletion  Scapular Bone Region  Severe depletion  Dorsal Hand  Severe depletion  Patellar Region  Severe depletion  Anterior Thigh Region  Severe depletion  Posterior Calf Region  Severe depletion  Edema  (RD Assessment)  None  Hair  Reviewed  Eyes  Reviewed  Mouth  Reviewed  Skin  Reviewed  Nails  Reviewed       Diet Order:  Diet regular Room service appropriate? Yes; Fluid consistency: Thin  EDUCATION NEEDS:   Not appropriate for education at this time  Skin:  Skin Assessment: Skin Integrity Issues: Skin Integrity Issues:: Incisions Incisions: left knee  Last BM:  06/12/17  Height:   Ht Readings from Last 1 Encounters:  06/11/17 5\' 6"  (1.676 m)    Weight:   Wt Readings from Last 1 Encounters:  06/11/17 110 lb (49.9 kg)    Ideal Body Weight:  59.1 kg  BMI:  Body mass index is 17.75 kg/m.  Estimated Nutritional Needs:   Kcal:  1500-1700 kcal (30-34 kcal/kg)  Protein:  75-85 gram (1.5-1.7 g/kg)  Fluid:  >1.5 L/day   Vanessa Kickarly Rochelle Larue RD, LDN Clinical Nutrition Pager # - [phone]

## 2017-06-12 NOTE — Care Management Note (Signed)
Case Management Note  Patient Details  Name: [name] MRN: [id number] Date of Birth: 12/29/1922  Subjective/Objective:    83 yo admitted with Abscess of the left knee.                Action/Plan[facility]  Expected Discharge Date:   (unknown)               Expected Discharge Plan:  Assisted Living / Rest Home  In-House Referral:  Clinical Social Work  Discharge planning Services  CM Consult  Post Acute Care Choice:    Choice offered to:     DME Arranged:    DME Agency:     HH Arranged:    HH Agency:     Status of Service:  In process, will continue to follow  If discussed at Long Length of Stay Meetings, dates discussed:    Additional CommentsBartholome Bill:  Christalyn Goertz H, RN 06/12/2017, 12:02 PM  [phone]

## 2017-06-12 NOTE — Progress Notes (Signed)
Patient ID: [name], female   DOB: 05-Dec-1922, 83 y.o.   MRN: [id number]    PROGRESS NOTE  JAMAL HASKIN  JYN:[id number] DOB: [birth year] DOA: 06/11/2017  PCP: Pearson Grippe, MD   Brief Narrative:  83 y.o. female with medical history significant of dementia, COPD, hypertension, who presented from skilled nursing facility for evaluation of progressively worsening left knee swelling. Please note that patient is not able to provide detailed history due to known dementia and therefore most of the information obtained from available records from emergency room physician as well as skilled nursing facility. Patient apparently fell 9 days prior to this admission and was seen in the emergency department, had a laceration that was repaired. Sutures have since been removed but patient has started to develop progressively worsening erythema, edema, warmth to touch in the left knee. This was associated with drainage. No reports of fevers or chills.  Assessment & Plan: Active Problems:   Abscess of left knee - drained in the ER - continue vancomycin day 2 - Continue to monitor response to antibiotic - May need another drainage if not improving in next 24-48 hours - wound care consultation    COPD - stable respiratory status    Hypertension - atenolol was held due to mild bradycardia, can resume once heart rate stabilized - losartan has also been held until renal function stabilized    Bradycardia - continue to hold atenolol and possibly resume in the next 24 hours if heart rate stable    Acute kidney injury - IV fluids provided and creatinine trending down - BMP in the morning    Underweight, severe protein calorie malnutrition - Body mass index is 17.75 kg/m.  DVT prophylaxis: Lovenox Sq Code Status: full code Family Communication: Patient at bedside  Disposition Plan: plan to discharge to skilled nursing facility once medically stable  Consultants:   None   Procedures:    Incision and drainage 06/11/2017  Antimicrobials:   Vancomycin 06/11/2017 -->  Subjective: No events overnight.  Objective: Vitals:   06/11/17 1157 06/11/17 1207 06/12/17 0406 06/12/17 0631  BP:  (!) 110/43  114/67  Pulse:  (!) 58  61  Resp:  16  14  Temp:  97.7 F (36.5 C)  97.8 F (36.6 C)  TempSrc:  Oral  Oral  SpO2:  97% 95% 97%  Weight: 49.9 kg (110 lb)     Height: 5\' 6"  (1.676 m)       Intake/Output Summary (Last 24 hours) at 06/12/17 1828 Last data filed at 06/12/17 1005  Gross per 24 hour  Intake             1360 ml  Output                0 ml  Net             1360 ml   Filed Weights   06/11/17 1157  Weight: 49.9 kg (110 lb)    Examination:  General exam: Appears calm and comfortable  Respiratory system: Clear to auscultation. Respiratory effort normal. Cardiovascular system: S1 & S2 heard, RRR. no rubs, gallops or clicks. No pedal edema. Gastrointestinal system: Abdomen is nondistended, soft and nontender. No organomegaly or masses felt. Central nervous system: Alert and oriented. No focal neurological deficits. Extremities: left knee swelling and edema, less TTP and less warmth to touch    Data Reviewed: I have personally reviewed following labs and imaging studies  CBC:  Recent Labs Lab 06/11/17  1353 06/11/17 1452 06/12/17 0347  WBC 7.5  --  8.1  NEUTROABS 4.9  --   --   HGB 10.2* 9.5* 10.9*  HCT 31.3* 28.0* 34.1*  MCV 88.7  --  88.3  PLT 358  --  340   Basic Metabolic Panel:  Recent Labs Lab 06/11/17 1452 06/12/17 0347  NA 140 141  K 4.3 4.1  CL 106 109  CO2  --  23  GLUCOSE 74 94  BUN 38* 37*  CREATININE 1.20* 1.04*  CALCIUM  --  9.1    Urine analysis:    Component Value Date/Time   COLORURINE STRAW (A) 06/02/2017 0554   APPEARANCEUR CLEAR 06/02/2017 0554   LABSPEC 1.009 06/02/2017 0554   PHURINE 7.0 06/02/2017 0554   GLUCOSEU 50 (A) 06/02/2017 0554   HGBUR NEGATIVE 06/02/2017 0554   BILIRUBINUR NEGATIVE  06/02/2017 0554   KETONESUR NEGATIVE 06/02/2017 0554   PROTEINUR NEGATIVE 06/02/2017 0554   UROBILINOGEN 0.2 02/21/2015 2337   NITRITE NEGATIVE 06/02/2017 0554   LEUKOCYTESUR NEGATIVE 06/02/2017 0554   Recent Results (from the past 240 hour(s))  Wound or Superficial Culture     Status: None (Preliminary result)   Collection Time: 06/11/17 12:56 PM  Result Value Ref Range Status   Specimen Description ABSCESS SOFT TISSUE  Final   Special Requests NONE  Final   Gram Stain   Final    FEW WBC PRESENT, PREDOMINANTLY PMN MODERATE GRAM POSITIVE COCCI    Culture   Final    CULTURE REINCUBATED FOR BETTER GROWTH Performed at [facility], [address] [place], [place] [address]    Report Status PENDING  Incomplete    Radiology Studies: No results found.  Scheduled Meds: . allopurinol  300 mg Oral Daily  . atorvastatin  20 mg Oral QHS  . docusate sodium  100 mg Oral Q12H  . enoxaparin (LOVENOX) injection  30 mg Subcutaneous Q24H  . feeding supplement (ENSURE ENLIVE)  237 mL Oral TID BM  . OLANZapine  5 mg Oral QHS  . pantoprazole  40 mg Oral Daily  . polyethylene glycol  17 g Oral Daily   Continuous Infusions: . sodium chloride 75 mL/hr at 06/12/17 0810  . [START ON 06/13/2017] vancomycin       LOS: 1 day   Time spent: 25 minutes   Debbora PrestoIskra Magick-Annais Crafts, MD [facility] Pager [phone]  If 7PM-7AM, please contact night-coverage [email or web address] Password [email or web address] 06/12/2017, 6:28 PM

## 2017-06-13 DIAGNOSIS — L02419 Cutaneous abscess of limb, unspecified: Secondary | ICD-10-CM

## 2017-06-13 LAB — BASIC METABOLIC PANEL
Anion gap: 6 (ref 5–15)
BUN: 36 mg/dL — AB (ref 6–20)
CALCIUM: 8.7 mg/dL — AB (ref 8.9–10.3)
CO2: 24 mmol/L (ref 22–32)
CREATININE: 1.18 mg/dL — AB (ref 0.44–1.00)
Chloride: 111 mmol/L (ref 101–111)
GFR calc non Af Amer: 39 mL/min — ABNORMAL LOW (ref >60)
GFR, EST AFRICAN AMERICAN: 45 mL/min — AB (ref >60)
Glucose, Bld: 100 mg/dL — ABNORMAL HIGH (ref 65–99)
Potassium: 4.2 mmol/L (ref 3.5–5.1)
SODIUM: 141 mmol/L (ref 135–145)

## 2017-06-13 LAB — CBC
HCT: 29 % — ABNORMAL LOW (ref 36.0–46.0)
HEMOGLOBIN: 9.3 g/dL — AB (ref 12.0–15.0)
MCH: 28.4 pg (ref 26.0–34.0)
MCHC: 32.1 g/dL (ref 30.0–36.0)
MCV: 88.7 fL (ref 78.0–100.0)
PLATELETS: 319 10*3/uL (ref 150–400)
RBC: 3.27 MIL/uL — AB (ref 3.87–5.11)
RDW: 14.3 % (ref 11.5–15.5)
WBC: 7.4 10*3/uL (ref 4.0–10.5)

## 2017-06-13 LAB — MRSA PCR SCREENING: MRSA BY PCR: INVALID — AB

## 2017-06-13 MED ORDER — LOSARTAN POTASSIUM 50 MG PO TABS
100.0000 mg | ORAL_TABLET | Freq: Every day | ORAL | Status: DC
  Administered 2017-06-13 – 2017-06-15 (×3): 100 mg via ORAL
  Filled 2017-06-13 (×3): qty 2

## 2017-06-13 MED ORDER — CEFAZOLIN SODIUM-DEXTROSE 1-4 GM/50ML-% IV SOLN
1.0000 g | Freq: Two times a day (BID) | INTRAVENOUS | Status: DC
  Administered 2017-06-13 – 2017-06-15 (×4): 1 g via INTRAVENOUS
  Filled 2017-06-13 (×4): qty 50

## 2017-06-13 MED ORDER — ATENOLOL 25 MG PO TABS: 25.0000 mg | ORAL_TABLET | Freq: Two times a day (BID) | ORAL | Status: DC

## 2017-06-13 MED ORDER — IPRATROPIUM-ALBUTEROL 0.5-2.5 (3) MG/3ML IN SOLN
3.0000 mL | Freq: Three times a day (TID) | RESPIRATORY_TRACT | Status: DC
  Administered 2017-06-14 – 2017-06-15 (×4): 3 mL via RESPIRATORY_TRACT
  Filled 2017-06-13 (×5): qty 3

## 2017-06-13 MED ORDER — ATENOLOL 25 MG PO TABS
25.0000 mg | ORAL_TABLET | Freq: Two times a day (BID) | ORAL | Status: DC
  Administered 2017-06-13 – 2017-06-14 (×2): 25 mg via ORAL
  Filled 2017-06-13 (×2): qty 1

## 2017-06-13 NOTE — Progress Notes (Signed)
Patient ID: [name], female   DOB: 1923-02-17, 83 y.o.   MRN: [id number]    PROGRESS NOTE  [name]  [id number]:[id number] DOB: Jun 09, 1923 DOA: 06/11/2017  PCP: Pearson Grippe, MD   Brief Narrative:  83 y.o. female with medical history significant of dementia, COPD, hypertension, who presented from skilled nursing facility for evaluation of progressively worsening left knee swelling. Please note that patient is not able to provide detailed history due to known dementia and therefore most of the information obtained from available records from emergency room physician as well as skilled nursing facility. Patient apparently fell 9 days prior to this admission and was seen in the emergency department, had a laceration that was repaired. Sutures have since been removed but patient has started to develop progressively worsening erythema, edema, warmth to touch in the left knee. This was associated with drainage. No reports of fevers or chills.  Assessment & Plan: Active Problems:   Abscess of left knee - drained in the ER - wound cultures with strep viridans and E. Coli - change ABX to Ancef per sensitivity report  - Continue to monitor response to antibiotic - appreciate WOC assistance, will consult with ortho     COPD - resp status stable    Hypertension - will resume atenolol     Bradycardia - resolved, resume atenolol     Acute kidney injury - Cr still slightly elevated - will repeat BMP in AM    Underweight, severe protein calorie malnutrition - Body mass index is 17.75 kg/m.  DVT prophylaxis: Lovenox Sq Code Status: full code Family Communication: Patient at bedside  Disposition Plan: plan to discharge to skilled nursing facility once medically stable  Consultants:   None   Procedures:   Incision and drainage 06/11/2017  Antimicrobials:   Vancomycin 06/11/2017 --> 10/27  Ancef 10/27 -->  Subjective: Pt reports feeling better.   Objective: Vitals:     06/12/17 1941 06/12/17 2007 06/13/17 0606 06/13/17 1642  BP:  (!) 157/92 (!) 148/77   Pulse:  73 64 74  Resp:  14 16 16   Temp:  98 F (36.7 C) 98 F (36.7 C) 97.7 F (36.5 C)  TempSrc:  Oral Oral Oral  SpO2: 94% 94% 99% 99%  Weight:      Height:        Intake/Output Summary (Last 24 hours) at 06/13/17 1833 Last data filed at 06/13/17 1634  Gross per 24 hour  Intake             1720 ml  Output              800 ml  Net              920 ml   Filed Weights   06/11/17 1157  Weight: 49.9 kg (110 lb)   Physical Exam  Constitutional: Appears calm, NAD CVS: RRR, S1/S2 +, no murmurs, no gallops, no carotid bruit.  Pulmonary: Effort and breath sounds normal, no stridor, rhonchi, wheezes, rales.  Abdominal: Soft. BS +,  no distension, tenderness, rebound or guarding.  Musculoskeletal: left knee still swollen and TTP but overall better    Data Reviewed: I have personally reviewed following labs and imaging studies  CBC:  Recent Labs Lab 06/11/17 1353 06/11/17 1452 06/12/17 0347 06/13/17 0346  WBC 7.5  --  8.1 7.4  NEUTROABS 4.9  --   --   --   HGB 10.2* 9.5* 10.9* 9.3*  HCT 31.3* 28.0* 34.1* 29.0*  MCV 88.7  --  88.3 88.7  PLT 358  --  340 319   Basic Metabolic Panel:  Recent Labs Lab 06/11/17 1452 06/12/17 0347 06/13/17 0346  NA 140 141 141  K 4.3 4.1 4.2  CL 106 109 111  CO2  --  23 24  GLUCOSE 74 94 100*  BUN 38* 37* 36*  CREATININE 1.20* 1.04* 1.18*  CALCIUM  --  9.1 8.7*    Urine analysis:    Component Value Date/Time   COLORURINE STRAW (A) 06/02/2017 0554   APPEARANCEUR CLEAR 06/02/2017 0554   LABSPEC 1.009 06/02/2017 0554   PHURINE 7.0 06/02/2017 0554   GLUCOSEU 50 (A) 06/02/2017 0554   HGBUR NEGATIVE 06/02/2017 0554   BILIRUBINUR NEGATIVE 06/02/2017 0554   KETONESUR NEGATIVE 06/02/2017 0554   PROTEINUR NEGATIVE 06/02/2017 0554   UROBILINOGEN 0.2 02/21/2015 2337   NITRITE NEGATIVE 06/02/2017 0554   LEUKOCYTESUR NEGATIVE 06/02/2017 0554    Recent Results (from the past 240 hour(s))  Wound or Superficial Culture     Status: None (Preliminary result)   Collection Time: 06/11/17 12:56 PM  Result Value Ref Range Status   Specimen Description ABSCESS SOFT TISSUE  Final   Special Requests NONE  Final   Gram Stain   Final    FEW WBC PRESENT, PREDOMINANTLY PMN MODERATE GRAM POSITIVE COCCI Performed at [facility], [address] [place], [place] [address]    Culture   Final    ABUNDANT VIRIDANS STREPTOCOCCUS FEW ESCHERICHIA COLI    Report Status PENDING  Incomplete   Organism ID, Bacteria ESCHERICHIA COLI  Final      Susceptibility   Escherichia coli - MIC*    AMPICILLIN 4 SENSITIVE Sensitive     CEFAZOLIN <=4 SENSITIVE Sensitive     CEFEPIME <=1 SENSITIVE Sensitive     CEFTAZIDIME <=1 SENSITIVE Sensitive     CEFTRIAXONE <=1 SENSITIVE Sensitive     CIPROFLOXACIN <=0.25 SENSITIVE Sensitive     GENTAMICIN <=1 SENSITIVE Sensitive     IMIPENEM <=0.25 SENSITIVE Sensitive     TRIMETH/SULFA <=20 SENSITIVE Sensitive     AMPICILLIN/SULBACTAM <=2 SENSITIVE Sensitive     PIP/TAZO <=4 SENSITIVE Sensitive     Extended ESBL NEGATIVE Sensitive     * FEW ESCHERICHIA COLI    Radiology Studies: No results found.  Scheduled Meds: . allopurinol  300 mg Oral Daily  . atorvastatin  20 mg Oral QHS  . docusate sodium  100 mg Oral Q12H  . enoxaparin (LOVENOX) injection  30 mg Subcutaneous Q24H  . feeding supplement (ENSURE ENLIVE)  237 mL Oral TID BM  . OLANZapine  5 mg Oral QHS  . pantoprazole  40 mg Oral Daily  . polyethylene glycol  17 g Oral Daily   Continuous Infusions: . sodium chloride 75 mL/hr at 06/12/17 2240  .  ceFAZolin (ANCEF) IV Stopped (06/13/17 1621)     LOS: 2 days   Time spent: 25 minutes   Debbora PrestoIskra Magick-Alwyn Cordner, MD [facility] Pager [phone]  If 7PM-7AM, please contact night-coverage [email or web address] Password [email or web address] 06/13/2017, 6:33 PM

## 2017-06-13 NOTE — Evaluation (Signed)
Physical Therapy Evaluation Patient Details Name: [name] MRN: [id number] DOB: 04/27/1923 Today's Date: 06/13/2017   History of Present Illness   83 yo female  with dementia, COPD, hypertension, who presented from skilled nursing facility for evaluation of progressively worsening left knee swelling. Hx of fall about 10 days ago with a laceration   Clinical Impression  At time of evaluation, pt had just finished eating a full breakfast.  She reported some fatigue after eating and was not able to pivot to chair.  However, I feel that she has good potential to improve with continued PT treatment over time  at SNF to return to ambulation with rolling walker     Follow Up Recommendations SNF    Equipment Recommendations  None recommended by PT    Recommendations for Other Services OT consult     Precautions / Restrictions Precautions Precautions: Fall Restrictions Weight Bearing Restrictions: No      Mobility  Bed Mobility Overal bed mobility: Needs Assistance Bed Mobility: Supine to Sit;Sit to Supine     Supine to sit: Mod assist Sit to supine: Mod assist   General bed mobility comments: needs assit for rolling and for raising upper body to sit on edge of bed   Transfers Overall transfer level: Needs assistance Equipment used: Rolling walker (2 wheeled) Transfers: Sit to/from Stand Sit to Stand: Mod assist         General transfer comment: 3 attempts to stand with pt doing a little better finding standing balance each time but requiring mod assist to lift hips off bed.  She was not able to raise her head and chest to stand erect and fatigued after a few seconds.   Ambulation/Gait Ambulation/Gait assistance:  (not tested due to pt weakness this morning )              Stairs            Wheelchair Mobility    Modified Rankin (Stroke Patients Only)       Balance Overall balance assessment: Needs assistance Sitting-balance support: Bilateral  upper extremity supported;Feet supported Sitting balance-Leahy Scale: Fair     Standing balance support: Bilateral upper extremity supported Standing balance-Leahy Scale: Poor                               Pertinent Vitals/Pain Pain Assessment: No/denies pain    Home Living Family/patient expects to be discharged to:: Assisted living                      Prior Function Level of Independence: Independent with assistive device(s)         Comments: pt says she used a walker      Hand Dominance        Extremity/Trunk Assessment   Upper Extremity Assessment Upper Extremity Assessment: Generalized weakness    Lower Extremity Assessment Lower Extremity Assessment: LLE deficits/detail LLE Deficits / Details: lacertation covered with clear thin dressing over anterior patella with area that is red but does not appear swollen.  Pt only c/o pain in knee when standing on it.        Communication   Communication: No difficulties  Cognition Arousal/Alertness: Awake/alert Behavior During Therapy: WFL for tasks assessed/performed Overall Cognitive Status: No family/caregiver present to determine baseline cognitive functioning (answers most questions.[facility])  General Comments: pt observed feeding herself breakfast       General Comments General comments (skin integrity, edema, etc.): healing open area on left anterior knee     Exercises     Assessment/Plan    PT Assessment Patient needs continued PT services  PT Problem List Decreased strength;Decreased activity tolerance;Decreased balance;Decreased mobility;Impaired tone;Decreased skin integrity;Pain       PT Treatment Interventions DME instruction;Gait training;Functional mobility training;Therapeutic activities;Therapeutic exercise;Balance training;Neuromuscular re-education;Patient/family education    PT Goals (Current  goals can be found in the Care Plan section)  Acute Rehab PT Goals Patient Stated Goal: to try to get up  PT Goal Formulation: With patient Time For Goal Achievement: 06/27/17 Potential to Achieve Goals: Good    Frequency Min 2X/week   Barriers to discharge        Co-evaluation               AM-PAC PT "6 Clicks" Daily Activity  Outcome Measure Difficulty turning over in bed (including adjusting bedclothes, sheets and blankets)?: A Lot Difficulty moving from lying on back to sitting on the side of the bed? : A Lot Difficulty sitting down on and standing up from a chair with arms (e.g., wheelchair, bedside commode, etc,.)?: A Lot Help needed moving to and from a bed to chair (including a wheelchair)?: Total Help needed walking in hospital room?: Total Help needed climbing 3-5 steps with a railing? : Total 6 Click Score: 9    End of Session Equipment Utilized During Treatment: Gait belt Activity Tolerance: Patient limited by fatigue;Patient limited by pain Patient left: in bed;with call bell/phone within reach;with bed alarm set Nurse Communication: Mobility status PT Visit Diagnosis: Unsteadiness on feet (R26.81);Muscle weakness (generalized) (M62.81);History of falling (Z91.81);Difficulty in walking, not elsewhere classified (R26.2)    Time: 1610-9604 PT Time Calculation (min) (ACUTE ONLY): 42 min   Charges:   PT Evaluation $PT Eval Low Complexity: 1 Low     PT G Codes:        Teresa K. Manson Passey, PT  Donnetta Hail 06/13/2017, 9:33 AM

## 2017-06-13 NOTE — Consult Note (Addendum)
WOC Nurse wound consult note Reason for Consult: Left knee edema, erythema, linear full thickness wound, s/p I&D in ED. Wound type: Infectious Pressure Injury POA: N/A Measurement: area of erythema at left knee measures 6cm x 8cm.  Linear open area over patella measures 0.5cm x 3cm x 0.1cm with red wound bed, no exudate. Wound bed:As described above Drainage (amount, consistency, odor) Scant light yellow exudate on old dressing, no fresh exudate. Periwound: Area of involvement is warm to touch, firm and is not fluctuant. Dressing procedure/placement/frequency: I have implemented a twice daily saline dressing to the left anterior knee and bilateral pressure redistribution heel boots to prevent pressure injuries. Recommend surgical or orthopedic consult for assessment of joint and further recommendations.  If you agree, please order/arrange. WOC nursing team will not follow, but will remain available to this patient, the nursing and medical teams.  Please re-consult if needed. Thanks, Ladona MowLaurie Sallee Hogrefe, MSN, RN, GNP, Hans EdenCWOCN, CWON-AP, FAAN  Pager# [phone]

## 2017-06-14 ENCOUNTER — Inpatient Hospital Stay (HOSPITAL_COMMUNITY): Payer: Medicare Other

## 2017-06-14 DIAGNOSIS — F0390 Unspecified dementia without behavioral disturbance: Secondary | ICD-10-CM

## 2017-06-14 LAB — AEROBIC CULTURE  (SUPERFICIAL SPECIMEN)

## 2017-06-14 LAB — CBC
HCT: 30.4 % — ABNORMAL LOW (ref 36.0–46.0)
HEMOGLOBIN: 10 g/dL — AB (ref 12.0–15.0)
MCH: 28.7 pg (ref 26.0–34.0)
MCHC: 32.9 g/dL (ref 30.0–36.0)
MCV: 87.1 fL (ref 78.0–100.0)
Platelets: 345 10*3/uL (ref 150–400)
RBC: 3.49 MIL/uL — AB (ref 3.87–5.11)
RDW: 14.1 % (ref 11.5–15.5)
WBC: 8.9 10*3/uL (ref 4.0–10.5)

## 2017-06-14 LAB — AEROBIC CULTURE W GRAM STAIN (SUPERFICIAL SPECIMEN)

## 2017-06-14 LAB — BASIC METABOLIC PANEL
ANION GAP: 9 (ref 5–15)
BUN: 29 mg/dL — ABNORMAL HIGH (ref 6–20)
CHLORIDE: 108 mmol/L (ref 101–111)
CO2: 26 mmol/L (ref 22–32)
Calcium: 9 mg/dL (ref 8.9–10.3)
Creatinine, Ser: 0.87 mg/dL (ref 0.44–1.00)
GFR calc non Af Amer: 56 mL/min — ABNORMAL LOW (ref >60)
Glucose, Bld: 82 mg/dL (ref 65–99)
POTASSIUM: 3.9 mmol/L (ref 3.5–5.1)
SODIUM: 143 mmol/L (ref 135–145)

## 2017-06-14 MED ORDER — ATENOLOL 25 MG PO TABS: 25.0000 mg | ORAL_TABLET | Freq: Two times a day (BID) | ORAL | Status: DC

## 2017-06-14 MED ORDER — ATENOLOL 25 MG PO TABS
25.0000 mg | ORAL_TABLET | Freq: Every day | ORAL | Status: DC
  Filled 2017-06-14: qty 1

## 2017-06-14 NOTE — Progress Notes (Addendum)
Patient ID: [name], female   DOB: 12/10/1922, 83 y.o.   MRN: [id number]    PROGRESS NOTE  [name]  HYQ:[id number] DOB: 09/06/1922 DOA: 06/11/2017  PCP: [name], [name], MD   Brief Narrative:  83 y.o. female with medical history significant of dementia, COPD, hypertension, who presented from skilled nursing facility for evaluation of progressively worsening left knee swelling. Please note that patient is not able to provide detailed history due to known dementia and therefore most of the information obtained from available records from emergency room physician as well as skilled nursing facility. Patient apparently fell 9 days prior to this admission and was seen in the emergency department, had a laceration that was repaired. Sutures have since been removed but patient has started to develop progressively worsening erythema, edema, warmth to touch in the left knee. This was associated with drainage. No reports of fevers or chills.  Assessment & Plan: Active Problems:   Abscess of left knee - drained in the ER - wound cultures with strep viridans and E. Coli - changed ABX to Ancef per sensitivity report  - appreciate ortho team assistance, no indication for surgical intervention at this time - continue supportive care     COPD - resp status stable     Hypertension - resume atenolol     Bradycardia - resolved, ok to resume atenolol but will keep QD (at home pt on BID regimen)    Acute kidney injury - suspected per renal in etiology - resolved  - BMP in AM    Underweight, severe protein calorie malnutrition - Body mass index is 17.75 kg/m.  DVT prophylaxis: Lovenox Sq Code Status: full code Family Communication: Patient at bedside  Disposition Plan: SNF in AM  Consultants:   None   Procedures:   Incision and drainage 06/11/2017  Antimicrobials:   Vancomycin 06/11/2017 --> 10/27  Ancef 10/27 -->  Subjective: No concerns overnight.    Objective: Vitals:   06/13/17 2327 06/14/17 0100 06/14/17 0516 06/14/17 0743  BP:  111/80 137/83   Pulse:   66   Resp:   16   Temp:   98 F (36.7 C)   TempSrc:   Oral   SpO2: 98%  96% 93%  Weight:      Height:        Intake/Output Summary (Last 24 hours) at 06/14/17 1409 Last data filed at 06/14/17 0939  Gross per 24 hour  Intake          4798.75 ml  Output              800 ml  Net          3998.75 ml   Filed Weights   06/11/17 1157  Weight: 49.9 kg (110 lb)   Physical Exam  Constitutional: Appear calm, NAD CVS: RRR, S1/S2 +, no murmurs, no gallops, no carotid bruit.  Pulmonary: Effort and breath sounds normal, no stridor, rhonchi, wheezes, rales.  Abdominal: Soft. BS +,  no distension, tenderness, rebound or guarding.  Musculoskeletal: left knee still swollen but overall better, less TTP, less warmth to touch   Data Reviewed: I have personally reviewed following labs and imaging studies  CBC:  Recent Labs Lab 06/11/17 1353 06/11/17 1452 06/12/17 0347 06/13/17 0346 06/14/17 0425  WBC 7.5  --  8.1 7.4 8.9  NEUTROABS 4.9  --   --   --   --   HGB 10.2* 9.5* 10.9* 9.3* 10.0*  HCT 31.3* 28.0* 34.1*  29.0* 30.4*  MCV 88.7  --  88.3 88.7 87.1  PLT 358  --  340 319 345   Basic Metabolic Panel:  Recent Labs Lab 06/11/17 1452 06/12/17 0347 06/13/17 0346 06/14/17 0425  NA 140 141 141 143  K 4.3 4.1 4.2 3.9  CL 106 109 111 108  CO2  --  23 24 26   GLUCOSE 74 94 100* 82  BUN 38* 37* 36* 29*  CREATININE 1.20* 1.04* 1.18* 0.87  CALCIUM  --  9.1 8.7* 9.0    Urine analysis:    Component Value Date/Time   COLORURINE STRAW (A) 06/02/2017 0554   APPEARANCEUR CLEAR 06/02/2017 0554   LABSPEC 1.009 06/02/2017 0554   PHURINE 7.0 06/02/2017 0554   GLUCOSEU 50 (A) 06/02/2017 0554   HGBUR NEGATIVE 06/02/2017 0554   BILIRUBINUR NEGATIVE 06/02/2017 0554   KETONESUR NEGATIVE 06/02/2017 0554   PROTEINUR NEGATIVE 06/02/2017 0554   UROBILINOGEN 0.2 02/21/2015 2337    NITRITE NEGATIVE 06/02/2017 0554   LEUKOCYTESUR NEGATIVE 06/02/2017 0554   Recent Results (from the past 240 hour(s))  Wound or Superficial Culture     Status: None   Collection Time: 06/11/17 12:56 PM  Result Value Ref Range Status   Specimen Description ABSCESS SOFT TISSUE  Final   Special Requests NONE  Final   Gram Stain   Final    FEW WBC PRESENT, PREDOMINANTLY PMN MODERATE GRAM POSITIVE COCCI Performed at [facility], [address], [place], [place] [address]    Culture   Final    ABUNDANT VIRIDANS STREPTOCOCCUS FEW ESCHERICHIA COLI    Report Status 06/14/2017 FINAL  Final   Organism ID, Bacteria ESCHERICHIA COLI  Final      Susceptibility   Escherichia coli - MIC*    AMPICILLIN 4 SENSITIVE Sensitive     CEFAZOLIN <=4 SENSITIVE Sensitive     CEFEPIME <=1 SENSITIVE Sensitive     CEFTAZIDIME <=1 SENSITIVE Sensitive     CEFTRIAXONE <=1 SENSITIVE Sensitive     CIPROFLOXACIN <=0.25 SENSITIVE Sensitive     GENTAMICIN <=1 SENSITIVE Sensitive     IMIPENEM <=0.25 SENSITIVE Sensitive     TRIMETH/SULFA <=20 SENSITIVE Sensitive     AMPICILLIN/SULBACTAM <=2 SENSITIVE Sensitive     PIP/TAZO <=4 SENSITIVE Sensitive     Extended ESBL NEGATIVE Sensitive     * FEW ESCHERICHIA COLI  MRSA PCR Screening     Status: Abnormal   Collection Time: 06/13/17  6:25 PM  Result Value Ref Range Status   MRSA by PCR INVALID RESULTS, SPECIMEN SENT FOR CULTURE (A) NEGATIVE Final    Comment:        The GeneXpert MRSA Assay (FDA approved for NASAL specimens only), is one component of a comprehensive MRSA colonization surveillance program. It is not intended to diagnose MRSA infection nor to guide or monitor treatment for MRSA infections.     Radiology Studies: Dg Knee Complete 4 Views Left  Result Date: 06/14/2017 CLINICAL DATA:  Nonhealing wound of the anterior left knee. History of arthritis. EXAM: LEFT KNEE - COMPLETE 4+ VIEW COMPARISON:  Radiographs 06/02/2017. FINDINGS: The bones  are demineralized. There is no evidence of acute fracture, dislocation or bone destruction. Small soft tissue defect is seen anterior to the patella on the lateral view. There is no evidence of foreign body or soft tissue emphysema. There is no significant joint effusion. Diffuse vascular calcifications are noted. IMPRESSION: Soft tissue defect anterior to the patella. No evidence of foreign body or osteomyelitis. Electronically  Signed   By: Carey Bullocks M.D.   On: 06/14/2017 13:32   Scheduled Meds: . allopurinol  300 mg Oral Daily  . atenolol  25 mg Oral BID  . atorvastatin  20 mg Oral QHS  . docusate sodium  100 mg Oral Q12H  . enoxaparin (LOVENOX) injection  30 mg Subcutaneous Q24H  . feeding supplement (ENSURE ENLIVE)  237 mL Oral TID BM  . ipratropium-albuterol  3 mL Nebulization TID  . losartan  100 mg Oral Daily  . OLANZapine  5 mg Oral QHS  . pantoprazole  40 mg Oral Daily  . polyethylene glycol  17 g Oral Daily   Continuous Infusions: . sodium chloride 50 mL/hr at 06/14/17 0601  .  ceFAZolin (ANCEF) IV Stopped (06/14/17 1051)     LOS: 3 days   Time spent: 25 minutes   Debbora Presto, MD [facility] Pager [phone]  If 7PM-7AM, please contact night-coverage [email or web address] Password [email or web address] 06/14/2017, 2:09 PM

## 2017-06-14 NOTE — Consult Note (Signed)
Orthopaedic Trauma Service (OTS) Consult   Patient ID: [name] MRN: [id number] DOB/AGE: 20-Jan-1923 83 y.o.  Reason for Consult:Left knee wound Referring Physician: Dr. Danie Binder, MD with [facility]  HPI: [name] is an 83 y.o. female who is being seen in consultation at the request of Dr. Izola [name] for evaluation of left knee wound.  The patient is unable to provide any significant history due to her dementia.  However according to the charts and Dr. Izola [name] the patient presented to the emergency room a couple days ago.  There was concern regarding the abscess a laceration to her knee from a fall approximately 10 days earlier.  It was sewn in the emergency room but unfortunately went on to develop an infection.  A "I&D" was performed in the emergency room upon her arrival which was on 06/11/2017.  Wound care consult was obtained which recommended evaluation by orthopedic surgery for ruling out any underlying involvement of the knee joint.  No further history was obtained by the patient.  Currently the patient is afebrile. She does not have a leukocytosis.  Past Medical History:  Diagnosis Date  . Arthritis   . Chronic bronchitis (HCC)   . COPD (chronic obstructive pulmonary disease) (HCC)   . Dementia   . GERD (gastroesophageal reflux disease)   . Gout   . H/O hiatal hernia   . High cholesterol   . Hypertension     Past Surgical History:  Procedure Laterality Date  . APPENDECTOMY  1945  . CATARACT EXTRACTION W/ INTRAOCULAR LENS  IMPLANT, BILATERAL Bilateral   . FRACTURE SURGERY    . HIP FRACTURE SURGERY     "don't remember which side"  . TEE WITHOUT CARDIOVERSION N/A 10/13/2013   Procedure: TRANSESOPHAGEAL ECHOCARDIOGRAM (TEE);  Surgeon: Lindsay Reichert, MD;  Location: [facility] ENDOSCOPY;  Service: Cardiovascular;  Laterality: N/A;    Family History  Problem Relation Age of Onset  . Cancer Mother   . Stroke Father   . Cirrhosis Brother     Social History:   reports that she quit smoking about 8 years ago. She has a 35.00 pack-year smoking history. She quit smokeless tobacco use about 12 years ago. She reports that she does not drink alcohol or use drugs.  Allergies:  Allergies  Allergen Reactions  . Penicillins Rash    Tolerated ceftriaxone/cefepime Has patient had a PCN reaction causing immediate rash, facial/tongue/throat swelling, SOB or lightheadedness with hypotension: Yes Has patient had a PCN reaction causing severe rash involving mucus membranes or skin necrosis: Unk Has patient had a PCN reaction that required hospitalization: Unk Has patient had a PCN reaction occurring within the last 10 years: Unk if all of the above answers are "NO", then may proceed with Cephalosporin use.     Medications:  No current facility-administered medications on file prior to encounter.    Current Outpatient Prescriptions on File Prior to Encounter  Medication Sig Dispense Refill  . albuterol (PROVENTIL HFA;VENTOLIN HFA) 108 (90 Base) MCG/ACT inhaler Inhale 2 puffs into the lungs every 4 (four) hours as needed for wheezing or shortness of breath. 1 Inhaler 0  . atenolol (TENORMIN) 25 MG tablet Take 25 mg by mouth 2 (two) times daily.     Marland Kitchen atorvastatin (LIPITOR) 20 MG tablet Take 20 mg by mouth daily.     Marland Kitchen docusate sodium (COLACE) 100 MG capsule Take 1 capsule (100 mg total) by mouth every 12 (twelve) hours. 60 capsule 0  . ipratropium-albuterol (DUONEB)  0.5-2.5 (3) MG/3ML SOLN Take 3 mLs by nebulization every 6 (six) hours as needed. (Patient taking differently: Take 3 mLs by nebulization every 8 (eight) hours. ) 360 mL 0  . losartan (COZAAR) 100 MG tablet Take 100 mg by mouth daily.     Marland Kitchen. OLANZapine (ZYPREXA) 5 MG tablet Take 5 mg by mouth at bedtime.    . pantoprazole (PROTONIX) 40 MG tablet TAKE 1 TABLET BY MOUTH EVERY DAY 30-60 MINUTES BEFORE FIRST MEAL OF THE DAY (Patient taking differently: Take 40 mg by mouth once a day) 30 tablet 0  .  polyethylene glycol (MIRALAX) packet Take 17 g by mouth daily. 14 each 0  . [DISCONTINUED] ipratropium (ATROVENT) 0.02 % nebulizer solution Take 2.5 mLs (0.5 mg total) by nebulization 4 (four) times daily. 75 mL 0    ROS: Unable to obtain due to the patients mental status  Exam: Blood pressure 137/83, pulse 66, temperature 98 F (36.7 C), temperature source Oral, resp. rate 16, height 5\' 6"  (1.676 m), weight 49.9 kg (110 lb), SpO2 93 %. General:Thin, cachetic female, sleeping Orientation:Does not wake or regard examiner Mood and Affect: Sleeping Gait: not assessed Coordination and balance: Could not assess  Left lower extremity: There is a approximately 6-7 cm linear wound on the anterior aspect of the patella.  There is some fibrinous exudate in the wound.  There is no active purulence.  There is some surrounding erythema but it appears that it is resolving.  There is no fluctuance about the area and no expressible fluid was able to be obtained.  The knee joint itself is able to bend and extend without any significant discomfort to the patient.  There is no effusion.  Her leg is very thin with minimal subcutaneous tissue.  I did not feel any bony defects or any fractures.  She cannot cooperate with physical exam so motor and sensory function was not able to be assessed.  She has a warm well-perfused foot.  She has no tenderness palpation.  Right lower extremity: Skin without lesions. No tenderness to palpation. Full painless ROM, No evidence of instability.  She is unable to cooperate with neuro exam or motor exam   Medical Decision Making: Imaging: X-rays of the left knee are reviewed which show soft tissue defect in the area of the wound.  However there is no involvement of the underlying patella.  The knee joint is well-maintained without any signs of osteomyelitis.  Labs: Hgb 10.0 WBC 8.9  Medical history and chart was reviewed  Assessment/Plan: 83 year old female with history of  dementia COPD and a left knee laceration then went on to become infected with a abscess that was drained in the emergency room.  At this time her clinical exam is benign.  I do not feel any fluctuance.  The wound appears to be superficial in nature.  The x-rays do not show any acute abnormality of the underlying bone.  I would recommend continuing with daily dressing changes per wound care. IV antibiotics may be discontinued and switched to p.o. antibiotics per the primary team's discretion.  I do not feel at this point that her wound needs any surgical debridement.  Recommendations: -Continue daily dressing changes -Transition to PO antibiotics -No surgical intervention warranted -Follow up with orthopaedics as needed -Please call with questions   Roby LoftsKevin P. Haddix, MD [facility] [phone] (phone)

## 2017-06-15 LAB — BASIC METABOLIC PANEL
ANION GAP: 10 (ref 5–15)
BUN: 28 mg/dL — ABNORMAL HIGH (ref 6–20)
CO2: 26 mmol/L (ref 22–32)
Calcium: 8.9 mg/dL (ref 8.9–10.3)
Chloride: 103 mmol/L (ref 101–111)
Creatinine, Ser: 1.17 mg/dL — ABNORMAL HIGH (ref 0.44–1.00)
GFR calc Af Amer: 45 mL/min — ABNORMAL LOW (ref >60)
GFR, EST NON AFRICAN AMERICAN: 39 mL/min — AB (ref >60)
GLUCOSE: 88 mg/dL (ref 65–99)
POTASSIUM: 4.5 mmol/L (ref 3.5–5.1)
Sodium: 139 mmol/L (ref 135–145)

## 2017-06-15 LAB — CBC
HEMATOCRIT: 32.1 % — AB (ref 36.0–46.0)
Hemoglobin: 10.6 g/dL — ABNORMAL LOW (ref 12.0–15.0)
MCH: 29 pg (ref 26.0–34.0)
MCHC: 33 g/dL (ref 30.0–36.0)
MCV: 87.7 fL (ref 78.0–100.0)
Platelets: 327 10*3/uL (ref 150–400)
RBC: 3.66 MIL/uL — AB (ref 3.87–5.11)
RDW: 14.4 % (ref 11.5–15.5)
WBC: 8.3 10*3/uL (ref 4.0–10.5)

## 2017-06-15 LAB — MRSA CULTURE: CULTURE: NOT DETECTED

## 2017-06-15 MED ORDER — TRAMADOL HCL 50 MG PO TABS: 50.0000 mg | ORAL_TABLET | Freq: Four times a day (QID) | ORAL | 0 refills | Status: DC | PRN

## 2017-06-15 MED ORDER — CEPHALEXIN 250 MG PO CAPS: 250.0000 mg | ORAL_CAPSULE | Freq: Three times a day (TID) | ORAL | 0 refills | Status: DC

## 2017-06-15 MED ORDER — ATENOLOL 25 MG PO TABS: 12.5000 mg | ORAL_TABLET | Freq: Every day | ORAL | Status: DC

## 2017-06-15 MED ORDER — CEPHALEXIN 250 MG PO CAPS
250.0000 mg | ORAL_CAPSULE | Freq: Three times a day (TID) | ORAL | Status: DC
  Filled 2017-06-15: qty 1

## 2017-06-15 MED ORDER — HYDRALAZINE HCL 10 MG PO TABS: 10.0000 mg | ORAL_TABLET | Freq: Three times a day (TID) | ORAL | Status: DC

## 2017-06-15 NOTE — Discharge Instructions (Signed)
Incision and Drainage, Care After  Refer to this sheet in the next few weeks. These instructions provide you with information about caring for yourself after your procedure. Your health care provider may also give you more specific instructions. Your treatment has been planned according to current medical practices, but problems sometimes occur. Call your health care provider if you have any problems or questions after your procedure.  What can I expect after the procedure?  After the procedure, it is common to have:  · Pain or discomfort around your incision site.  · Drainage from your incision.     Follow these instructions at home:  ·   · Take over-the-counter and prescription medicines only as told by your health care provider.  · If you were prescribed an antibiotic medicine, take it as told by your health care provider. Do not stop taking the antibiotic even if you start to feel better.  · Follow instructions from your health care provider about:  ? How to take care of your incision.  ? When and how you should change your packing and bandage (dressing). Wash your hands with soap and water before you change your dressing. If soap and water are not available, use hand sanitizer.  ? When you should remove your dressing.  · Do not take baths, swim, or use a hot tub until your health care provider approves.  · Keep all follow-up visits as told by your health care provider. This is important.  · Check your incision area every day for signs of infection. Check for:  ? More redness, swelling, or pain.  ? More fluid or blood.  ? Warmth.  ? Pus or a bad smell.  Contact a health care provider if:  · Your cyst or abscess returns.  · You have a fever.  · You have more redness, swelling, or pain around your incision.  · You have more fluid or blood coming from your incision.  · Your incision feels warm to the touch.  · You have pus or a bad smell coming from your incision.  Get help right away if:  · You have severe pain or  bleeding.  · You cannot eat or drink without vomiting.  · You have decreased urine output.  · You become short of breath.  · You have chest pain.  · You cough up blood.  · The area where the incision and drainage occurred becomes numb or it tingles.  This information is not intended to replace advice given to you by your health care provider. Make sure you discuss any questions you have with your health care provider.  Document Released: 10/27/2011 Document Revised: 01/04/2016 Document Reviewed: 05/25/2015  Elsevier Interactive Patient Education © 2017 Elsevier Inc.   

## 2017-06-15 NOTE — Progress Notes (Signed)
Called report to Kennyth ArnoldStacy that will receive patient at [facility].  Patient will be transported via [facility] to facility.

## 2017-06-15 NOTE — NC FL2 (Signed)
[place] MEDICAID FL2 LEVEL OF CARE SCREENING TOOL     IDENTIFICATION  Patient Name: [name] Birthdate: 04-05-1923 Sex: female Admission Date (Current Location): 06/11/2017  [facility] and [place] Number:  Producer, television/film/video and Address:  [facility],  [address], [place] [address]      Provider Number: [id number]  Attending Physician Name and Address:  Dorothea Ogle, MD  Relative Name and Phone Number:       Current Level of Care: Hospital Recommended Level of Care: Skilled Nursing Facility Prior Approval Number:    Date Approved/Denied:   PASRR Number: [id number]  Discharge Plan: SNF (ALF)    Current Diagnoses: Patient Active Problem List   Diagnosis Date Noted  . Dementia 06/14/2017  . Abscess of left knee 06/11/2017  . Failure to thrive in adult 04/15/2016  . Acute encephalopathy 04/15/2016  . GERD (gastroesophageal reflux disease) 04/15/2016  . Orthostatic hypotension 03/18/2016  . Orthostatic dizziness 03/18/2016  . Postinflammatory pulmonary fibrosis (HCC) 04/29/2015  . Dyspnea 04/26/2015  . Severe malnutrition (HCC) 02/23/2015  . AKI (acute kidney injury) (HCC) 02/22/2015  . Arthritis 02/22/2015  . Weakness   . Dehydration 02/21/2015  . ARF (acute renal failure) (HCC) 10/03/2014  . Underweight 07/20/2014  . Acute bronchitis 07/20/2014  . COPD GOLD I with reversibility  07/18/2014  . Essential hypertension 07/18/2014  . Gout 07/18/2014  . SBO (small bowel obstruction) (HCC) 01/14/2014  . Hypercalcemia 01/14/2014  . Protein-calorie malnutrition, severe (HCC) 10/10/2013  . Acute respiratory failure (HCC) 10/07/2013  . Hypokalemia 10/07/2013  . H. influenzae septicemia (HCC) 12/06/2012  . Leukocytopenia, unspecified 12/03/2012  . HTN (hypertension) 12/02/2012  . Gouty arthritis 12/02/2012    Orientation RESPIRATION BLADDER Height & Weight     Self, Place  Normal Incontinent Weight: 110 lb (49.9 kg) Height:  5\' 6"   (167.6 cm)  BEHAVIORAL SYMPTOMS/MOOD NEUROLOGICAL BOWEL NUTRITION STATUS      Incontinent Diet (regular)  AMBULATORY STATUS COMMUNICATION OF NEEDS Skin   Extensive Assist Verbally Surgical wounds, Skin abrasions     Left knee edema, erythema, linear full thickness wound, s/p I&D in ED. Wound type: Infectious Pressure Injury POA: N/A Measurement: area of erythema at left knee measures 6cm x 8cm.  Linear open area over patella measures 0.5cm x 3cm x 0.1cm with red wound bed, no exudate. Wound bed:As described above Drainage (amount, consistency, odor) Scant light yellow exudate on old dressing, no fresh exudate. Periwound: Area of involvement is warm to touch, firm and is not fluctuant. Dressing procedure/placement/frequency: I have implemented a twice daily saline dressing to the left anterior knee and bilateral pressure redistribution heel boots to prevent pressure injuries.                   Personal Care Assistance Level of Assistance  Bathing, Feeding, Dressing Bathing Assistance: Limited assistance Feeding assistance: Independent (sometimes needs prompting) Dressing Assistance: Limited assistance     Functional Limitations Info  Sight, Hearing, Speech Sight Info: Adequate Hearing Info: Adequate Speech Info: Adequate    SPECIAL CARE FACTORS FREQUENCY  PT (By licensed PT), OT (By licensed OT)     PT Frequency: 5x OT Frequency: 5x            Contractures Contractures Info: Not present    Additional Factors Info  Code Status, Allergies Code Status Info: full code Allergies Info: penicillins           Current Medications (06/15/2017):  This is the  current hospital active medication list Current Facility-Administered Medications  Medication Dose Route Frequency Provider Last Rate Last Dose  . albuterol (PROVENTIL) (2.5 MG/3ML) 0.083% nebulizer solution 3 mL  3 mL Inhalation Q4H PRN Dorothea OgleMyers, Iskra M, MD   3 mL at 06/13/17 2327  . allopurinol (ZYLOPRIM) tablet 300  mg  300 mg Oral Daily Dorothea OgleMyers, Iskra M, MD   300 mg at 06/14/17 1021  . atenolol (TENORMIN) tablet 25 mg  25 mg Oral Daily Dorothea OgleMyers, Iskra M, MD      . atorvastatin (LIPITOR) tablet 20 mg  20 mg Oral QHS Dorothea OgleMyers, Iskra M, MD   20 mg at 06/14/17 2156  . ceFAZolin (ANCEF) IVPB 1 g/50 mL premix  1 g Intravenous Q12H Dorothea OgleMyers, Iskra M, MD   Stopped at 06/14/17 2231  . docusate sodium (COLACE) capsule 100 mg  100 mg Oral Q12H Dorothea OgleMyers, Iskra M, MD   100 mg at 06/14/17 2156  . enoxaparin (LOVENOX) injection 30 mg  30 mg Subcutaneous Q24H Dorothea OgleMyers, Iskra M, MD   30 mg at 06/14/17 2157  . feeding supplement (ENSURE ENLIVE) (ENSURE ENLIVE) liquid 237 mL  237 mL Oral TID BM Dorothea OgleMyers, Iskra M, MD   237 mL at 06/14/17 2000  . ipratropium-albuterol (DUONEB) 0.5-2.5 (3) MG/3ML nebulizer solution 3 mL  3 mL Nebulization TID Dorothea OgleMyers, Iskra M, MD   3 mL at 06/15/17 0742  . losartan (COZAAR) tablet 100 mg  100 mg Oral Daily Blount, Xenia T, NP   100 mg at 06/14/17 1021  . OLANZapine (ZYPREXA) tablet 5 mg  5 mg Oral QHS Dorothea OgleMyers, Iskra M, MD   5 mg at 06/14/17 2156  . pantoprazole (PROTONIX) EC tablet 40 mg  40 mg Oral Daily Dorothea OgleMyers, Iskra M, MD   40 mg at 06/14/17 1021  . polyethylene glycol (MIRALAX / GLYCOLAX) packet 17 g  17 g Oral Daily Dorothea OgleMyers, Iskra M, MD   17 g at 06/14/17 1021     Discharge Medications: Please see discharge summary for a list of discharge medications.  Relevant Imaging Results:  Relevant Lab Results:   Additional Information SSN [id number]  Nelwyn SalisburyMeghan R Deseray Daponte, LCSW

## 2017-06-15 NOTE — Progress Notes (Signed)
Reviewed pt's care needs and discussed with [facility] and pt's niece. Due to needing twice daily dressing changes as well as skilled PT, both agreed Short Term rehab for SNF may be more appropriate for pt at DC rather than returning straight [facility]. Completed FL2- niece prefers [facility]- CSW made referral. Will follow.  Ilean SkillMeghan Devonn Giampietro, MSW, LCSW Clinical Social Work 06/15/2017 [phone]

## 2017-06-15 NOTE — Care Management Important Message (Signed)
Important Message  Patient Details  Name: Katha HammingCallie B Hershkowitz MRN: [id number] Date of Birth: 08/26/1922   Medicare Important Message Given:  Yes    Caren MacadamFuller, Lexton Hidalgo 06/15/2017, 10:46 AMImportant Message  Patient Details  Name: Katha HammingCallie B Stephens MRN: [id number] Date of Birth: 01/01/1923   Medicare Important Message Given:  Yes    Caren MacadamFuller, Jasma Seevers 06/15/2017, 10:46 AM

## 2017-06-15 NOTE — Clinical Social Work Placement (Signed)
Pt discharging today- will [facility], room 220, report  [phone] for ST rehab (wound care and PT/OT). Plan to return [facility] following [facility]F.   PT's niece Tiffany aware and agreeable. Completing paperwork at facility this afternoon. Pt will transport via [facility]- CSW completed medical necessity form and arranged transport.  See below for placement details. All information provided to facility via the HUB  CLINICAL SOCIAL WORK PLACEMENT  NOTE  Date:  06/15/2017  Patient Details  Name: Lindsay HammingCallie B Austria MRN: [id number] Date of Birth: 07/12/1923  Clinical Social Work is seeking post-discharge placement for this patient at the Skilled  Nursing Facility level of care (*CSW will initial, date and re-position this form in  chart as items are completed):  Yes   Patient/family provided with [facility] Clinical Social Work Department's list of facilities offering this level of care within the geographic area requested by the patient (or if unable, by the patient's family).  Yes   Patient/family informed of their freedom to choose among providers that offer the needed level of care, that participate in Medicare, Medicaid or managed care program needed by the patient, have an available bed and are willing to accept the patient.  Yes   Patient/family informed of [facility]'s ownership interest in [facility] and [facility], as well as of the fact that they are under no obligation to receive care at these facilities.  PASRR submitted to EDS on       PASRR number received on       Existing PASRR number confirmed on 06/15/17     FL2 transmitted to all facilities in geographic area requested by pt/family on 06/15/17     FL2 transmitted to all facilities within larger geographic area on       Patient informed that his/her managed care company has contracts with or will negotiate with certain facilities, including the following:        Yes   Patient/family  informed of bed offers received.  [facility] bed [facility]     Physician recommends and patient chooses [facility]    [facility] on 06/15/17.  Patient to be transferred to facility by [facility]     Patient family notified on 06/15/17 of transfer.  Name of family member notified:  niece Tiffany     PHYSICIAN       Additional Comment:    _______________________________________________ Nelwyn SalisburyMeghan R Benigno Check, LCSW 06/15/2017, 1:35 PM

## 2017-06-15 NOTE — Discharge Summary (Signed)
Physician Discharge Summary  JAHAIRA EARNHART [name]:[id number] DOB: 26-Feb-1923 DOA: 06/11/2017  PCP: Pearson Grippe, MD  Admit date: 06/11/2017 Discharge date: 06/15/2017  Recommendations for Outpatient Follow-up:  1. Pt will need to follow up with PCP in 1 week post discharge 2. Please obtain BMP to evaluate electrolytes and kidney function 3. Please also check CBC to evaluate Hg and Hct levels 4. Please note that Amlodipine held due to bradycardia and HR in 40-50's 5. Losartan also held due to lower BP, can resume if needed and once clinically indicated  6. Pt also to complete Keflex for 7 more days post discharge for knee abscess and cellulitis   Discharge Diagnoses:  Active Problems:   Abscess of left knee   Dementia  Discharge Condition: Stable  Diet recommendation: Heart healthy diet discussed in details   History of present illness:  83 y.o.femalewith medical history significant of dementia, COPD, hypertension,who presented from skilled nursing facility for evaluation of progressively worsening left knee swelling. Please note that patient is not able to provide detailed history due to known dementia and therefore most of the information obtained from available records from emergency room physician as well as skilled nursing facility. Patient apparently fell 9 days prior to this admission and was seen in the emergency department, had a laceration that was repaired. Sutures have since been removed but patient has started to develop progressively worsening erythema, edema, warmth to touch in the left knee. This was associated with drainage. No reports of fevers or chills.  Assessment & Plan: Active Problems: Abscess of left knee - drained in the ER - wound cultures with strep viridans and E. Coli - changed ABX to Ancef per sensitivity report, pt wants to be discharged, will change to Keflex - appreciate ortho team assistance, no indication for surgical intervention at this  time - continue supportive care   COPD - resp status stable   Hypertension - still low BP, holding atenolol and losartan   Bradycardia - tried to resume atenolol but pt became bradycardic again, so this was stopped   Acute kidney injury - suspected per renal in etiology - resolved   Underweight, severe protein calorie malnutrition - Body mass index is 17.75 kg/m.  DVT prophylaxis: Lovenox Sq Code Status: full code Family Communication: Patient at bedside  Disposition Plan: SNF   Consultants:   None   Procedures:   Incision and drainage 06/11/2017  Antimicrobials:   Vancomycin 06/11/2017 --> 10/27  Ancef 10/27 --> 10/29  Keflex 10/29 - continue on discharge   Procedures/Studies: Dg Chest 1 View  Result Date: 06/02/2017 CLINICAL DATA:  Initial evaluation for acute trauma, fall. EXAM: CHEST 1 VIEW COMPARISON:  Prior radiograph from 12/25/2016. FINDINGS: Mild cardiomegaly, stable. Mediastinal silhouette within normal limits. Aortic atherosclerosis. Lungs mildly hypoinflated with basilar fibrotic lung changes, similar to previous. No pulmonary edema or pleural effusion. No pneumothorax. Right lung apex, favored to reflect pleuroparenchymal thickening and/or summation of overlapping osseous shadows. Possible infiltrate not entirely excluded, and could be considered in the correct clinical setting. No other focal airspace disease. Remotely healed fractures of the right posterior seventh and eighth ribs noted. No acute osseous abnormality. Scoliosis noted. Osteopenia. IMPRESSION: 1. Small area of confluent opacity at the peripheral right lung apex, favored to reflect pleuroparenchymal scarring and/or summation of shadows. Infiltrate not entirely excluded, and could be considered in the correct clinical setting. 2. No other active cardiopulmonary disease. 3. Remotely healed right posterior seventh and eighth rib fractures. Electronically Signed  By: Rise MuBenjamin   McClintock M.D.   On: 06/02/2017 04:31   Dg Pelvis 1-2 Views  Result Date: 06/02/2017 CLINICAL DATA:  Initial evaluation for acute trauma, fall. EXAM: PELVIS - 1-2 VIEW COMPARISON:  None. FINDINGS: No acute fracture dislocation. Femoral heads in normal line within the acetabula. Femoral head heights preserved. Bony pelvis intact. SI joints approximated. No pubic diastasis. Diffuse osteopenia. No soft tissue abnormality. Prominent vascular calcifications noted within the proximal thighs. Degenerative osteoarthritic changes about the hips bilaterally. Additional degenerative changes noted within lower lumbar spine. IMPRESSION: No acute osseous abnormality about the pelvis. Electronically Signed   By: Rise MuBenjamin  McClintock M.D.   On: 06/02/2017 04:24   Ct Head Wo Contrast  Result Date: 06/02/2017 CLINICAL DATA:  Initial evaluation for acute trauma, fall. EXAM: CT HEAD WITHOUT CONTRAST CT MAXILLOFACIAL WITHOUT CONTRAST CT CERVICAL SPINE WITHOUT CONTRAST TECHNIQUE: Multidetector CT imaging of the head, cervical spine, and maxillofacial structures were performed using the standard protocol without intravenous contrast. Multiplanar CT image reconstructions of the cervical spine and maxillofacial structures were also generated. COMPARISON:  Prior CT from 04/23/2016. FINDINGS: CT HEAD FINDINGS Brain: Generalized age related cerebral atrophy. Moderate chronic small vessel ischemic disease. Remote infarcts involving the right occipital lobe and right cerebellar hemisphere noted. No acute intracranial hemorrhage. No evidence for acute large vessel territory infarct. No mass lesion, midline shift or mass effect. No hydrocephalus. No extra-axial fluid collection. Vascular: No hyperdense vessel. Calcified atherosclerosis at the skullbase. Skull: Soft tissue contusion present at the left forehead/ nasal bridge. Scalp soft tissues otherwise unremarkable. Calvarium intact. Other: Mastoid air cells are clear. CT MAXILLOFACIAL  FINDINGS Osseous: Zygomatic arches intact. No acute maxillary fracture. Pterygoid plates intact. Lucencies extending through the bilateral nasal bones, suspicious for acute nondisplaced fractures. Probable acute nondisplaced fracture of the anterior nasal septum (series 8, image 52). Mandible intact. Mandibular condyles normally situated. No acute abnormality about the remaining dentition. Scattered dental caries noted. Orbits: Globes and orbital soft tissues within normal limits. Patient status post lens extraction bilaterally. Bony orbits intact without orbital floor fracture. Sinuses: Clear.  No hemosinus. Soft tissues: Soft tissue contusion at the left forehead extending towards the nasal bridge and about the nose. CT CERVICAL SPINE FINDINGS Alignment: Straightening of the normal cervical lordosis. 3 mm anterolisthesis of C3 on C4 likely due to chronic facet degeneration. Skull base and vertebrae: Skullbase intact. Normal C1-2 articulations are preserved in the dens is intact. Vertebral body heights are maintained. No acute fracture. Lucency through the right facet of C4 appears corticated common is likely chronic in nature (series 16, image 13). Soft tissues and spinal canal: Soft tissues of the neck demonstrate no acute abnormality. No abnormal prevertebral edema. Spinal canal within normal limits. Vascular calcifications present about the carotid bifurcations. Disc levels: Moderate degenerative spondylolysis at C3-4 through C6-7. Multilevel facet arthrosis, greatest at C3-4. Upper chest: Visualized upper chest within normal limits. Partially visualized lungs are clear. Emphysema noted. Other: None. IMPRESSION: CT HEAD: 1. No acute intracranial process. 2. Remote right occipital and cerebellar infarcts. 3. Moderate cerebral atrophy with chronic small vessel ischemic disease. CT MAXILLOFACIAL: 1. Acute nondisplaced bilateral nasal bone fractures, with additional nondisplaced fracture of the anterior nasal  septum. 2. Soft tissue contusion extending from the left forehead inferiorly to involve the nasal bridge and nose. 3. No other acute maxillofacial injury. CT CERVICAL SPINE: 1. No acute traumatic injury within the cervical spine. 2. Straightening of the normal cervical lordosis with moderate degenerate spondylolysis at C3-4 through  C6-7. 3. Emphysema. Electronically Signed   By: Rise Mu M.D.   On: 06/02/2017 05:20   Ct Cervical Spine Wo Contrast  Result Date: 06/02/2017 CLINICAL DATA:  Initial evaluation for acute trauma, fall. EXAM: CT HEAD WITHOUT CONTRAST CT MAXILLOFACIAL WITHOUT CONTRAST CT CERVICAL SPINE WITHOUT CONTRAST TECHNIQUE: Multidetector CT imaging of the head, cervical spine, and maxillofacial structures were performed using the standard protocol without intravenous contrast. Multiplanar CT image reconstructions of the cervical spine and maxillofacial structures were also generated. COMPARISON:  Prior CT from 04/23/2016. FINDINGS: CT HEAD FINDINGS Brain: Generalized age related cerebral atrophy. Moderate chronic small vessel ischemic disease. Remote infarcts involving the right occipital lobe and right cerebellar hemisphere noted. No acute intracranial hemorrhage. No evidence for acute large vessel territory infarct. No mass lesion, midline shift or mass effect. No hydrocephalus. No extra-axial fluid collection. Vascular: No hyperdense vessel. Calcified atherosclerosis at the skullbase. Skull: Soft tissue contusion present at the left forehead/ nasal bridge. Scalp soft tissues otherwise unremarkable. Calvarium intact. Other: Mastoid air cells are clear. CT MAXILLOFACIAL FINDINGS Osseous: Zygomatic arches intact. No acute maxillary fracture. Pterygoid plates intact. Lucencies extending through the bilateral nasal bones, suspicious for acute nondisplaced fractures. Probable acute nondisplaced fracture of the anterior nasal septum (series 8, image 52). Mandible intact. Mandibular  condyles normally situated. No acute abnormality about the remaining dentition. Scattered dental caries noted. Orbits: Globes and orbital soft tissues within normal limits. Patient status post lens extraction bilaterally. Bony orbits intact without orbital floor fracture. Sinuses: Clear.  No hemosinus. Soft tissues: Soft tissue contusion at the left forehead extending towards the nasal bridge and about the nose. CT CERVICAL SPINE FINDINGS Alignment: Straightening of the normal cervical lordosis. 3 mm anterolisthesis of C3 on C4 likely due to chronic facet degeneration. Skull base and vertebrae: Skullbase intact. Normal C1-2 articulations are preserved in the dens is intact. Vertebral body heights are maintained. No acute fracture. Lucency through the right facet of C4 appears corticated common is likely chronic in nature (series 16, image 13). Soft tissues and spinal canal: Soft tissues of the neck demonstrate no acute abnormality. No abnormal prevertebral edema. Spinal canal within normal limits. Vascular calcifications present about the carotid bifurcations. Disc levels: Moderate degenerative spondylolysis at C3-4 through C6-7. Multilevel facet arthrosis, greatest at C3-4. Upper chest: Visualized upper chest within normal limits. Partially visualized lungs are clear. Emphysema noted. Other: None. IMPRESSION: CT HEAD: 1. No acute intracranial process. 2. Remote right occipital and cerebellar infarcts. 3. Moderate cerebral atrophy with chronic small vessel ischemic disease. CT MAXILLOFACIAL: 1. Acute nondisplaced bilateral nasal bone fractures, with additional nondisplaced fracture of the anterior nasal septum. 2. Soft tissue contusion extending from the left forehead inferiorly to involve the nasal bridge and nose. 3. No other acute maxillofacial injury. CT CERVICAL SPINE: 1. No acute traumatic injury within the cervical spine. 2. Straightening of the normal cervical lordosis with moderate degenerate spondylolysis  at C3-4 through C6-7. 3. Emphysema. Electronically Signed   By: Rise Mu M.D.   On: 06/02/2017 05:20   Dg Knee Complete 4 Views Left  Result Date: 06/14/2017 CLINICAL DATA:  Nonhealing wound of the anterior left knee. History of arthritis. EXAM: LEFT KNEE - COMPLETE 4+ VIEW COMPARISON:  Radiographs 06/02/2017. FINDINGS: The bones are demineralized. There is no evidence of acute fracture, dislocation or bone destruction. Small soft tissue defect is seen anterior to the patella on the lateral view. There is no evidence of foreign body or soft tissue emphysema. There is no  significant joint effusion. Diffuse vascular calcifications are noted. IMPRESSION: Soft tissue defect anterior to the patella. No evidence of foreign body or osteomyelitis. Electronically Signed   By: Carey Bullocks M.D.   On: 06/14/2017 13:32   Dg Knee Complete 4 Views Left  Result Date: 06/02/2017 CLINICAL DATA:  Initial evaluation for acute trauma, fall. EXAM: LEFT KNEE - COMPLETE 4+ VIEW COMPARISON:  None. FINDINGS: No acute fracture or dislocation. No joint effusion. No acute soft tissue abnormality. Diffuse osteopenia. Prominent vascular calcifications noted within the leg. IMPRESSION: No acute osseous abnormality about the left knee. Electronically Signed   By: Rise Mu M.D.   On: 06/02/2017 04:22   Ct Maxillofacial Wo Contrast  Result Date: 06/02/2017 CLINICAL DATA:  Initial evaluation for acute trauma, fall. EXAM: CT HEAD WITHOUT CONTRAST CT MAXILLOFACIAL WITHOUT CONTRAST CT CERVICAL SPINE WITHOUT CONTRAST TECHNIQUE: Multidetector CT imaging of the head, cervical spine, and maxillofacial structures were performed using the standard protocol without intravenous contrast. Multiplanar CT image reconstructions of the cervical spine and maxillofacial structures were also generated. COMPARISON:  Prior CT from 04/23/2016. FINDINGS: CT HEAD FINDINGS Brain: Generalized age related cerebral atrophy. Moderate  chronic small vessel ischemic disease. Remote infarcts involving the right occipital lobe and right cerebellar hemisphere noted. No acute intracranial hemorrhage. No evidence for acute large vessel territory infarct. No mass lesion, midline shift or mass effect. No hydrocephalus. No extra-axial fluid collection. Vascular: No hyperdense vessel. Calcified atherosclerosis at the skullbase. Skull: Soft tissue contusion present at the left forehead/ nasal bridge. Scalp soft tissues otherwise unremarkable. Calvarium intact. Other: Mastoid air cells are clear. CT MAXILLOFACIAL FINDINGS Osseous: Zygomatic arches intact. No acute maxillary fracture. Pterygoid plates intact. Lucencies extending through the bilateral nasal bones, suspicious for acute nondisplaced fractures. Probable acute nondisplaced fracture of the anterior nasal septum (series 8, image 52). Mandible intact. Mandibular condyles normally situated. No acute abnormality about the remaining dentition. Scattered dental caries noted. Orbits: Globes and orbital soft tissues within normal limits. Patient status post lens extraction bilaterally. Bony orbits intact without orbital floor fracture. Sinuses: Clear.  No hemosinus. Soft tissues: Soft tissue contusion at the left forehead extending towards the nasal bridge and about the nose. CT CERVICAL SPINE FINDINGS Alignment: Straightening of the normal cervical lordosis. 3 mm anterolisthesis of C3 on C4 likely due to chronic facet degeneration. Skull base and vertebrae: Skullbase intact. Normal C1-2 articulations are preserved in the dens is intact. Vertebral body heights are maintained. No acute fracture. Lucency through the right facet of C4 appears corticated common is likely chronic in nature (series 16, image 13). Soft tissues and spinal canal: Soft tissues of the neck demonstrate no acute abnormality. No abnormal prevertebral edema. Spinal canal within normal limits. Vascular calcifications present about the  carotid bifurcations. Disc levels: Moderate degenerative spondylolysis at C3-4 through C6-7. Multilevel facet arthrosis, greatest at C3-4. Upper chest: Visualized upper chest within normal limits. Partially visualized lungs are clear. Emphysema noted. Other: None. IMPRESSION: CT HEAD: 1. No acute intracranial process. 2. Remote right occipital and cerebellar infarcts. 3. Moderate cerebral atrophy with chronic small vessel ischemic disease. CT MAXILLOFACIAL: 1. Acute nondisplaced bilateral nasal bone fractures, with additional nondisplaced fracture of the anterior nasal septum. 2. Soft tissue contusion extending from the left forehead inferiorly to involve the nasal bridge and nose. 3. No other acute maxillofacial injury. CT CERVICAL SPINE: 1. No acute traumatic injury within the cervical spine. 2. Straightening of the normal cervical lordosis with moderate degenerate spondylolysis at C3-4 through C6-7.  3. Emphysema. Electronically Signed   By: Rise Mu M.D.   On: 06/02/2017 05:20     Discharge Exam: Vitals:   06/15/17 1048 06/15/17 1050  BP: (!) 87/52 (!) 105/59  Pulse: 68 65  Resp:    Temp:    SpO2:     Vitals:   06/15/17 0542 06/15/17 0742 06/15/17 1048 06/15/17 1050  BP: (!) 186/88  (!) 87/52 (!) 105/59  Pulse: (!) 56  68 65  Resp: 18     Temp: 97.6 F (36.4 C)     TempSrc: Axillary     SpO2: 96% 93%    Weight:      Height:        General: Pt is alert, follows commands appropriately, not in acute distress Cardiovascular: Regular rate and rhythm, S1/S2 +, no murmurs, no rubs, no gallops Respiratory: Clear to auscultation bilaterally, no wheezing, no crackles, no rhonchi Abdominal: Soft, non tender, non distended, bowel sounds +, no guarding Extremities: left knee still swollen but less tender and no warmth to touch   Discharge Instructions   Allergies as of 06/15/2017      Reactions   Penicillins Rash   Tolerated ceftriaxone/cefepime Has patient had a PCN reaction  causing immediate rash, facial/tongue/throat swelling, SOB or lightheadedness with hypotension: Yes Has patient had a PCN reaction causing severe rash involving mucus membranes or skin necrosis: Unk Has patient had a PCN reaction that required hospitalization: Unk Has patient had a PCN reaction occurring within the last 10 years: Unk if all of the above answers are "NO", then may proceed with Cephalosporin use.      Medication List    STOP taking these medications   atenolol 25 MG tablet Commonly known as:  TENORMIN   doxycycline 100 MG capsule Commonly known as:  VIBRAMYCIN   losartan 100 MG tablet Commonly known as:  COZAAR     TAKE these medications   acetaminophen 325 MG tablet Commonly known as:  TYLENOL Take 650 mg by mouth 2 (two) times daily.   albuterol 108 (90 Base) MCG/ACT inhaler Commonly known as:  PROVENTIL HFA;VENTOLIN HFA Inhale 2 puffs into the lungs every 4 (four) hours as needed for wheezing or shortness of breath.   atorvastatin 20 MG tablet Commonly known as:  LIPITOR Take 20 mg by mouth daily.   cephALEXin 250 MG capsule Commonly known as:  KEFLEX Take 1 capsule (250 mg total) by mouth every 8 (eight) hours.   docusate sodium 100 MG capsule Commonly known as:  COLACE Take 1 capsule (100 mg total) by mouth every 12 (twelve) hours.   ipratropium-albuterol 0.5-2.5 (3) MG/3ML Soln Commonly known as:  DUONEB Take 3 mLs by nebulization every 6 (six) hours as needed. What changed:  when to take this   OLANZapine 5 MG tablet Commonly known as:  ZYPREXA Take 5 mg by mouth at bedtime.   pantoprazole 40 MG tablet Commonly known as:  PROTONIX TAKE 1 TABLET BY MOUTH EVERY DAY 30-60 MINUTES BEFORE FIRST MEAL OF THE DAY What changed:  See the new instructions.   polyethylene glycol packet Commonly known as:  MIRALAX Take 17 g by mouth daily.   traMADol 50 MG tablet Commonly known as:  ULTRAM Take 1 tablet (50 mg total) by mouth every 6 (six) hours  as needed for moderate pain or severe pain.      Follow-up Information    Pearson Grippe, MD Follow up.   Specialty:  Internal Medicine Contact information: [phone]  [address] [place] [address] [phone]            The results of significant diagnostics from this hospitalization (including imaging, microbiology, ancillary and laboratory) are listed below for reference.     Microbiology: Recent Results (from the past 240 hour(s))  Wound or Superficial Culture     Status: None   Collection Time: 06/11/17 12:56 PM  Result Value Ref Range Status   Specimen Description ABSCESS SOFT TISSUE  Final   Special Requests NONE  Final   Gram Stain   Final    FEW WBC PRESENT, PREDOMINANTLY PMN MODERATE GRAM POSITIVE COCCI Performed at [facility], [address] [place], [place] [address]    Culture   Final    ABUNDANT VIRIDANS STREPTOCOCCUS FEW ESCHERICHIA COLI    Report Status 06/14/2017 FINAL  Final   Organism ID, Bacteria ESCHERICHIA COLI  Final      Susceptibility   Escherichia coli - MIC*    AMPICILLIN 4 SENSITIVE Sensitive     CEFAZOLIN <=4 SENSITIVE Sensitive     CEFEPIME <=1 SENSITIVE Sensitive     CEFTAZIDIME <=1 SENSITIVE Sensitive     CEFTRIAXONE <=1 SENSITIVE Sensitive     CIPROFLOXACIN <=0.25 SENSITIVE Sensitive     GENTAMICIN <=1 SENSITIVE Sensitive     IMIPENEM <=0.25 SENSITIVE Sensitive     TRIMETH/SULFA <=20 SENSITIVE Sensitive     AMPICILLIN/SULBACTAM <=2 SENSITIVE Sensitive     PIP/TAZO <=4 SENSITIVE Sensitive     Extended ESBL NEGATIVE Sensitive     * FEW ESCHERICHIA COLI  MRSA PCR Screening     Status: Abnormal   Collection Time: 06/13/17  6:25 PM  Result Value Ref Range Status   MRSA by PCR INVALID RESULTS, SPECIMEN SENT FOR CULTURE (A) NEGATIVE Final    Comment:        The GeneXpert MRSA Assay (FDA approved for NASAL specimens only), is one component of a comprehensive MRSA colonization surveillance program. It is  not intended to diagnose MRSA infection nor to guide or monitor treatment for MRSA infections.   MRSA culture     Status: None   Collection Time: 06/13/17  6:25 PM  Result Value Ref Range Status   Specimen Description NOSE  Final   Special Requests NONE  Final   Culture   Final    NO MRSA DETECTED Performed at [facility], [address], [place], [place] [address]    Report Status 06/15/2017 FINAL  Final     Labs: Basic Metabolic Panel:  Recent Labs Lab 06/11/17 1452 06/12/17 0347 06/13/17 0346 06/14/17 0425 06/15/17 0423  NA 140 141 141 143 139  K 4.3 4.1 4.2 3.9 4.5  CL 106 109 111 108 103  CO2  --  23 24 26 26   GLUCOSE 74 94 100* 82 88  BUN 38* 37* 36* 29* 28*  CREATININE 1.20* 1.04* 1.18* 0.87 1.17*  CALCIUM  --  9.1 8.7* 9.0 8.9   Liver Function Tests: No results for input(s): AST, ALT, ALKPHOS, BILITOT, PROT, ALBUMIN in the last 168 hours. No results for input(s): LIPASE, AMYLASE in the last 168 hours. No results for input(s): AMMONIA in the last 168 hours. CBC:  Recent Labs Lab 06/11/17 1353 06/11/17 1452 06/12/17 0347 06/13/17 0346 06/14/17 0425 06/15/17 0423  WBC 7.5  --  8.1 7.4 8.9 8.3  NEUTROABS 4.9  --   --   --   --   --   HGB  10.2* 9.5* 10.9* 9.3* 10.0* 10.6*  HCT 31.3* 28.0* 34.1* 29.0* 30.4* 32.1*  MCV 88.7  --  88.3 88.7 87.1 87.7  PLT 358  --  340 319 345 327   SIGNED: Time coordinating discharge: 45 minutes  Debbora Presto, MD  [facility] 06/15/2017, 12:49 PM Pager [phone]  If 7PM-7AM, please contact night-coverage [email or web address] Password [email or web address]

## 2017-06-16 ENCOUNTER — Encounter: Payer: Self-pay | Admitting: Internal Medicine

## 2017-06-16 ENCOUNTER — Non-Acute Institutional Stay (SKILLED_NURSING_FACILITY): Payer: Medicare Other | Admitting: Internal Medicine

## 2017-06-16 DIAGNOSIS — R001 Bradycardia, unspecified: Secondary | ICD-10-CM | POA: Diagnosis not present

## 2017-06-16 DIAGNOSIS — L02416 Cutaneous abscess of left lower limb: Secondary | ICD-10-CM

## 2017-06-16 DIAGNOSIS — R9389 Abnormal findings on diagnostic imaging of other specified body structures: Secondary | ICD-10-CM | POA: Diagnosis not present

## 2017-06-16 DIAGNOSIS — J449 Chronic obstructive pulmonary disease, unspecified: Secondary | ICD-10-CM | POA: Diagnosis not present

## 2017-06-16 NOTE — Assessment & Plan Note (Signed)
06/16/17 repeat chest x-ray if asthmatic bronchitis fails to respond to steroids and pulmonary toilet

## 2017-06-16 NOTE — Patient Instructions (Signed)
See assessment and plan under each diagnosis in the problem list and acutely for this visit 

## 2017-06-16 NOTE — Progress Notes (Signed)
NURSING HOME LOCATION:  [facility] ROOM NUMBER:  [id number]  CODE STATUS:  DNR  PCP:  Pearson Grippe, MD  [address] [place] [address]   This is a comprehensive admission note to [facility] performed on this date less than 30 days from date of admission. Included are preadmission medical/surgical history;reconciled medication list; family history; social history and comprehensive review of systems.  Corrections and additions to the records were documented . Comprehensive physical exam was also performed. Additionally a clinical summary was entered for each active diagnosis pertinent to this admission in the Problem List to enhance continuity of care.  HPI:  The patient was hospitalized 10/25-10/29/18 transferred from the skilled nursing facility for evaluation of progressively worse left knee swelling. The patient has baseline dementia and was unable to provide any meaningful history. Apparently the patient fell 10/16 and was seen in the ED and laceration repaired. Following removal of the sutures the patient has had progressive erythema, edema, increased warmth to touch & associated drainage. There is no reported fever or chills. Abscess of left knee was drained in the ED. Empirically the patient was started on vancomycin 10/25 Cultures revealed abundant strep viridans and Escherichia coli. Based on sensitivity ,antibiotics were changed to Ancef 10/27-10/29 The patient requested discharge and therefore she was changed to oral Keflex. Beta blocker was initially initiated but bradycardia recurred prompting its discontinuance.Blood pressure was low and her beta blocker and calcium channel blocker were held.  The last TSH on record was 2.105 on 04/15/16. Labs at discharge reveal BUN of 28, creatinine 1.17, GFR 45, hemoglobin 10.6, hematocrit 32.1. Anemia was essentially stable. Indices were normochromic, normocytic. She was transferred to the SNF for  rehabilitation.  Past medical and surgical history: Patient has history of COPD, dyslipidemia, gout, GERD, and severe protein caloric malnutrition. Surgeries include TKA.  Social history: quit smoking 2010; quit snuff 2006;non drinker  Family history: reviewed  Review of systems:Could not be completed due to dementia. Patient told staff that she feels she has pneumonia. Staff described wheezing and coughing 06/16/17. Last x-ray on record was 06/02/17. This revealed a small opacity at the peripheral right lung apex suggestive of pleural parenchymal scarring or overlapping structures. There was evidence of prior healed seventh and eighth right rib fractures. (film personally reviewed)  Physical exam:  Pertinent or positive findings: Patient appears frail and essentially cachectic. Responses are essentially illegible . She has dense arcus senilis. She has a 12 x 8 mm irregular keratotic lesion over the left upper lip which appears to be resolving eschar. Breath sounds are markedly decreased. The nurse practitioner had noted a nonproductive cough and rhonchi which cleared with cough as well as anterior wheezing. Those are not auscultated at this time. She has a grade 1.5 systolic murmur loudest in the epigastrium. Abdomen is protuberant. 6 x 2 regular eschar over the left knee. Slight effusion is present. Pulses are decreased.  General appearance: no acute distress , increased work of breathing is present.   Lymphatic: No lymphadenopathy about the head, neck, axilla . Eyes: No conjunctival inflammation or lid edema is present. There is no scleral icterus. Ears:  External ear exam shows no significant lesions or deformities.   Nose:  External nasal examination shows no deformity or inflammation. Nasal mucosa are pink and moist without lesions ,exudates Oral exam: lips and gums are healthy appearing.There is no oropharyngeal erythema or exudate . Neck:  No thyromegaly, masses, tenderness noted.  Heart:  Normal rate and regular rhythm. S1 and S2 normal without gallop, click, rub .  Lungs:without wheezes, rhonchi,rales , rubs on my exam. Abdomen:Bowel sounds are normal. Abdomen is soft and nontender with no organomegaly, hernias,masses. GU: deferred  Extremities:  No cyanosis, clubbing,edema  Neurologic exam : Strength could not be testted  in upper & lower extremities Balance,Rhomberg,finger to nose testing could not be completed due to clinical state Skin: Warm & dry w/o tenting. No significant  rash.  See clinical summary under each active problem in the Problem List with associated updated therapeutic plan

## 2017-06-16 NOTE — Assessment & Plan Note (Signed)
06/16/17 continue to hold beta blocker and calcium channel blocker based on present vital signs

## 2017-06-16 NOTE — Assessment & Plan Note (Addendum)
06/16/17 intermittent wheezing and rhonchi Short course of prednisone and regular bronchodilator therapy 3-4 days If symptoms are progressive, repeat chest x-ray and compare with film 06/02/17 which revealed an ill-defined infiltrate in the right apex

## 2017-06-17 ENCOUNTER — Other Ambulatory Visit: Payer: Self-pay | Admitting: Internal Medicine

## 2017-06-17 ENCOUNTER — Encounter: Payer: Self-pay | Admitting: Internal Medicine

## 2017-06-17 NOTE — Assessment & Plan Note (Signed)
Complete Keflex Add Zithromax if RTI evident

## 2017-06-22 ENCOUNTER — Non-Acute Institutional Stay (SKILLED_NURSING_FACILITY): Payer: Medicare Other | Admitting: Adult Health

## 2017-06-22 ENCOUNTER — Encounter: Payer: Self-pay | Admitting: Adult Health

## 2017-06-22 DIAGNOSIS — Z9181 History of falling: Secondary | ICD-10-CM

## 2017-06-22 DIAGNOSIS — R600 Localized edema: Secondary | ICD-10-CM | POA: Diagnosis not present

## 2017-06-22 DIAGNOSIS — G4700 Insomnia, unspecified: Secondary | ICD-10-CM

## 2017-06-22 NOTE — Progress Notes (Signed)
DATE:  06/22/2017   MRN:  [id number]  BIRTHDAY: [birth year]  Facility:  Nursing Home Location:  [facility] Room Number: 220  LEVEL OF CARE:  SNF (31)  Contact Information    Name Relation Home Work Mobile   Miles,Tiffany Niece   [phone]       Code Status History    Date Active Date Inactive Code Status Order ID Comments User Context   06/11/2017 18:26 06/15/2017 18:26 Full Code [id number]  Dorothea Ogle, MD Inpatient   04/15/2016 20:57 04/16/2016 22:59 Full Code [id number]  Briscoe Deutscher, MD ED   03/18/2016 18:14 03/21/2016 18:46 Full Code [id number]  Eddie North, MD Inpatient   02/22/2015 00:46 02/25/2015 17:30 Full Code [id number]  Ron Parker, MD Inpatient   10/03/2014 19:22 10/06/2014 17:50 Full Code [id number]  Leroy Sea, MD Inpatient   07/18/2014 22:44 07/20/2014 19:38 Full Code [id number]  Lynden Oxford, MD Inpatient   01/14/2014 08:02 01/18/2014 17:41 Full Code [id number]  Catarina Hartshorn, MD Inpatient   10/07/2013 17:23 10/17/2013 22:07 Full Code [id number]  Marinda Elk, MD Inpatient   06/09/2013 18:57 06/14/2013 19:15 Full Code [id number]  Leroy Sea, MD ED   12/02/2012 19:28 12/06/2012 20:48 Full Code [id number]  Clydia Llano, MD Inpatient       Chief Complaint  Patient presents with  . Acute Visit    Fall, insomnia    HISTORY OF PRESENT ILLNESS:  This is a 89-YO female seen for an acute visit secondary to a fall and insomnia.  She is a short-term rehabilitation resident.  She has a PMH of COPD, dyslipidemia, gout, GERD, severe protein calorie malnutrition, and is S/P TKA. She had a fall over the weekend. She was noted to have a skin tear on her right wrist. She was reported to have not slept last night. Family is requesting for her to be on sleeping medication. She was seen in the room today and was noted to be asleep. She is verbally responsive when talked to. She was recently admitted to [facility] on  06/15/17 from the hospital with abscess on left knee. Left knee wound noted to be dry and has completed antibiotic therapy.    PAST MEDICAL HISTORY:  Past Medical History:  Diagnosis Date  . Arthritis   . Chronic bronchitis (HCC)   . COPD (chronic obstructive pulmonary disease) (HCC)   . Dementia   . GERD (gastroesophageal reflux disease)   . Gout   . H/O hiatal hernia   . High cholesterol   . Hypertension      CURRENT MEDICATIONS: Reviewed    Medication List        Accurate as of 06/22/17  3:19 PM. Always use your most recent med list.          acetaminophen 325 MG tablet Commonly known as:  TYLENOL   albuterol 108 (90 Base) MCG/ACT inhaler Commonly known as:  PROVENTIL HFA;VENTOLIN HFA Inhale 2 puffs into the lungs every 4 (four) hours as needed for wheezing or shortness of breath.   atorvastatin 20 MG tablet Commonly known as:  LIPITOR   docusate sodium 100 MG capsule Commonly known as:  COLACE Take 1 capsule (100 mg total) by mouth every 12 (twelve) hours.   ipratropium-albuterol 0.5-2.5 (3) MG/3ML Soln Commonly known as:  DUONEB Take 3 mLs by nebulization every 6 (six) hours as needed.   NUTRITIONAL SUPPLEMENT Liqd   OLANZapine 5 MG tablet Commonly known  as:  ZYPREXA   pantoprazole 40 MG tablet Commonly known as:  PROTONIX TAKE 1 TABLET BY MOUTH EVERY DAY 30-60 MINUTES BEFORE FIRST MEAL OF THE DAY   polyethylene glycol packet Commonly known as:  MIRALAX Take 17 g by mouth daily.   traMADol 50 MG tablet Commonly known as:  ULTRAM Take 1 tablet (50 mg total) by mouth every 6 (six) hours as needed for moderate pain or severe pain.        Allergies  Allergen Reactions  . Penicillins Rash    Tolerated ceftriaxone/cefepime Has patient had a PCN reaction causing immediate rash, facial/tongue/throat swelling, SOB or lightheadedness with hypotension: Yes Has patient had a PCN reaction causing severe rash involving mucus membranes or skin necrosis:  Unk Has patient had a PCN reaction that required hospitalization: Unk Has patient had a PCN reaction occurring within the last 10 years: Unk if all of the above answers are "NO", then may proceed with Cephalosporin use.      REVIEW OF SYSTEMS:  Unable to obtain due to being sleepy    PHYSICAL EXAMINATION  GENERAL APPEARANCE:  In no acute distress.  SKIN:  Skin tear on right wrist and left knee with dry wound, both covered with dry dressing MOUTH and THROAT: Lips are without lesions. Oral mucosa is moist and without lesions. RESPIRATORY: breathing is even & unlabored, BS CTAB CARDIAC: RRR, + murmur,no extra heart sounds GI: abdomen soft, normal BS, no masses, no tenderness, no hepatomegaly, no splenomegaly EXTREMITIES: Left foot 2+ edema PSYCHIATRIC:  Affect and behavior are appropriate    LABS/RADIOLOGY: Labs reviewed: Basic Metabolic Panel: Recent Labs    06/13/17 0346 06/14/17 0425 06/15/17 0423  NA 141 143 139  K 4.2 3.9 4.5  CL 111 108 103  CO2 24 26 26   GLUCOSE 100* 82 88  BUN 36* 29* 28*  CREATININE 1.18* 0.87 1.17*  CALCIUM 8.7* 9.0 8.9   CBC: Recent Labs    12/01/16 1055 06/02/17 0500 06/11/17 1353  06/13/17 0346 06/14/17 0425 06/15/17 0423  WBC 16.9* 7.8 7.5   < > 7.4 8.9 8.3  NEUTROABS 14.6* 5.5 4.9  --   --   --   --   HGB 11.1* 11.1* 10.2*   < > 9.3* 10.0* 10.6*  HCT 34.1* 34.8* 31.3*   < > 29.0* 30.4* 32.1*  MCV 91.9 88.8 88.7   < > 88.7 87.1 87.7  PLT 233 212 358   < > 319 345 327   < > = values in this interval not displayed.     Dg Chest 1 View  Result Date: 06/02/2017 CLINICAL DATA:  Initial evaluation for acute trauma, fall. EXAM: CHEST 1 VIEW COMPARISON:  Prior radiograph from 12/25/2016. FINDINGS: Mild cardiomegaly, stable. Mediastinal silhouette within normal limits. Aortic atherosclerosis. Lungs mildly hypoinflated with basilar fibrotic lung changes, similar to previous. No pulmonary edema or pleural effusion. No pneumothorax. Right  lung apex, favored to reflect pleuroparenchymal thickening and/or summation of overlapping osseous shadows. Possible infiltrate not entirely excluded, and could be considered in the correct clinical setting. No other focal airspace disease. Remotely healed fractures of the right posterior seventh and eighth ribs noted. No acute osseous abnormality. Scoliosis noted. Osteopenia. IMPRESSION: 1. Small area of confluent opacity at the peripheral right lung apex, favored to reflect pleuroparenchymal scarring and/or summation of shadows. Infiltrate not entirely excluded, and could be considered in the correct clinical setting. 2. No other active cardiopulmonary disease. 3. Remotely healed right posterior seventh and  eighth rib fractures. Electronically Signed   By: Rise Mu M.D.   On: 06/02/2017 04:31   Dg Pelvis 1-2 Views  Result Date: 06/02/2017 CLINICAL DATA:  Initial evaluation for acute trauma, fall. EXAM: PELVIS - 1-2 VIEW COMPARISON:  None. FINDINGS: No acute fracture dislocation. Femoral heads in normal line within the acetabula. Femoral head heights preserved. Bony pelvis intact. SI joints approximated. No pubic diastasis. Diffuse osteopenia. No soft tissue abnormality. Prominent vascular calcifications noted within the proximal thighs. Degenerative osteoarthritic changes about the hips bilaterally. Additional degenerative changes noted within lower lumbar spine. IMPRESSION: No acute osseous abnormality about the pelvis. Electronically Signed   By: Rise Mu M.D.   On: 06/02/2017 04:24   Ct Head Wo Contrast  Result Date: 06/02/2017 CLINICAL DATA:  Initial evaluation for acute trauma, fall. EXAM: CT HEAD WITHOUT CONTRAST CT MAXILLOFACIAL WITHOUT CONTRAST CT CERVICAL SPINE WITHOUT CONTRAST TECHNIQUE: Multidetector CT imaging of the head, cervical spine, and maxillofacial structures were performed using the standard protocol without intravenous contrast. Multiplanar CT image  reconstructions of the cervical spine and maxillofacial structures were also generated. COMPARISON:  Prior CT from 04/23/2016. FINDINGS: CT HEAD FINDINGS Brain: Generalized age related cerebral atrophy. Moderate chronic small vessel ischemic disease. Remote infarcts involving the right occipital lobe and right cerebellar hemisphere noted. No acute intracranial hemorrhage. No evidence for acute large vessel territory infarct. No mass lesion, midline shift or mass effect. No hydrocephalus. No extra-axial fluid collection. Vascular: No hyperdense vessel. Calcified atherosclerosis at the skullbase. Skull: Soft tissue contusion present at the left forehead/ nasal bridge. Scalp soft tissues otherwise unremarkable. Calvarium intact. Other: Mastoid air cells are clear. CT MAXILLOFACIAL FINDINGS Osseous: Zygomatic arches intact. No acute maxillary fracture. Pterygoid plates intact. Lucencies extending through the bilateral nasal bones, suspicious for acute nondisplaced fractures. Probable acute nondisplaced fracture of the anterior nasal septum (series 8, image 52). Mandible intact. Mandibular condyles normally situated. No acute abnormality about the remaining dentition. Scattered dental caries noted. Orbits: Globes and orbital soft tissues within normal limits. Patient status post lens extraction bilaterally. Bony orbits intact without orbital floor fracture. Sinuses: Clear.  No hemosinus. Soft tissues: Soft tissue contusion at the left forehead extending towards the nasal bridge and about the nose. CT CERVICAL SPINE FINDINGS Alignment: Straightening of the normal cervical lordosis. 3 mm anterolisthesis of C3 on C4 likely due to chronic facet degeneration. Skull base and vertebrae: Skullbase intact. Normal C1-2 articulations are preserved in the dens is intact. Vertebral body heights are maintained. No acute fracture. Lucency through the right facet of C4 appears corticated common is likely chronic in nature (series 16,  image 13). Soft tissues and spinal canal: Soft tissues of the neck demonstrate no acute abnormality. No abnormal prevertebral edema. Spinal canal within normal limits. Vascular calcifications present about the carotid bifurcations. Disc levels: Moderate degenerative spondylolysis at C3-4 through C6-7. Multilevel facet arthrosis, greatest at C3-4. Upper chest: Visualized upper chest within normal limits. Partially visualized lungs are clear. Emphysema noted. Other: None. IMPRESSION: CT HEAD: 1. No acute intracranial process. 2. Remote right occipital and cerebellar infarcts. 3. Moderate cerebral atrophy with chronic small vessel ischemic disease. CT MAXILLOFACIAL: 1. Acute nondisplaced bilateral nasal bone fractures, with additional nondisplaced fracture of the anterior nasal septum. 2. Soft tissue contusion extending from the left forehead inferiorly to involve the nasal bridge and nose. 3. No other acute maxillofacial injury. CT CERVICAL SPINE: 1. No acute traumatic injury within the cervical spine. 2. Straightening of the normal cervical lordosis with  moderate degenerate spondylolysis at C3-4 through C6-7. 3. Emphysema. Electronically Signed   By: Rise Mu M.D.   On: 06/02/2017 05:20   Ct Cervical Spine Wo Contrast  Result Date: 06/02/2017 CLINICAL DATA:  Initial evaluation for acute trauma, fall. EXAM: CT HEAD WITHOUT CONTRAST CT MAXILLOFACIAL WITHOUT CONTRAST CT CERVICAL SPINE WITHOUT CONTRAST TECHNIQUE: Multidetector CT imaging of the head, cervical spine, and maxillofacial structures were performed using the standard protocol without intravenous contrast. Multiplanar CT image reconstructions of the cervical spine and maxillofacial structures were also generated. COMPARISON:  Prior CT from 04/23/2016. FINDINGS: CT HEAD FINDINGS Brain: Generalized age related cerebral atrophy. Moderate chronic small vessel ischemic disease. Remote infarcts involving the right occipital lobe and right cerebellar  hemisphere noted. No acute intracranial hemorrhage. No evidence for acute large vessel territory infarct. No mass lesion, midline shift or mass effect. No hydrocephalus. No extra-axial fluid collection. Vascular: No hyperdense vessel. Calcified atherosclerosis at the skullbase. Skull: Soft tissue contusion present at the left forehead/ nasal bridge. Scalp soft tissues otherwise unremarkable. Calvarium intact. Other: Mastoid air cells are clear. CT MAXILLOFACIAL FINDINGS Osseous: Zygomatic arches intact. No acute maxillary fracture. Pterygoid plates intact. Lucencies extending through the bilateral nasal bones, suspicious for acute nondisplaced fractures. Probable acute nondisplaced fracture of the anterior nasal septum (series 8, image 52). Mandible intact. Mandibular condyles normally situated. No acute abnormality about the remaining dentition. Scattered dental caries noted. Orbits: Globes and orbital soft tissues within normal limits. Patient status post lens extraction bilaterally. Bony orbits intact without orbital floor fracture. Sinuses: Clear.  No hemosinus. Soft tissues: Soft tissue contusion at the left forehead extending towards the nasal bridge and about the nose. CT CERVICAL SPINE FINDINGS Alignment: Straightening of the normal cervical lordosis. 3 mm anterolisthesis of C3 on C4 likely due to chronic facet degeneration. Skull base and vertebrae: Skullbase intact. Normal C1-2 articulations are preserved in the dens is intact. Vertebral body heights are maintained. No acute fracture. Lucency through the right facet of C4 appears corticated common is likely chronic in nature (series 16, image 13). Soft tissues and spinal canal: Soft tissues of the neck demonstrate no acute abnormality. No abnormal prevertebral edema. Spinal canal within normal limits. Vascular calcifications present about the carotid bifurcations. Disc levels: Moderate degenerative spondylolysis at C3-4 through C6-7. Multilevel facet  arthrosis, greatest at C3-4. Upper chest: Visualized upper chest within normal limits. Partially visualized lungs are clear. Emphysema noted. Other: None. IMPRESSION: CT HEAD: 1. No acute intracranial process. 2. Remote right occipital and cerebellar infarcts. 3. Moderate cerebral atrophy with chronic small vessel ischemic disease. CT MAXILLOFACIAL: 1. Acute nondisplaced bilateral nasal bone fractures, with additional nondisplaced fracture of the anterior nasal septum. 2. Soft tissue contusion extending from the left forehead inferiorly to involve the nasal bridge and nose. 3. No other acute maxillofacial injury. CT CERVICAL SPINE: 1. No acute traumatic injury within the cervical spine. 2. Straightening of the normal cervical lordosis with moderate degenerate spondylolysis at C3-4 through C6-7. 3. Emphysema. Electronically Signed   By: Rise Mu M.D.   On: 06/02/2017 05:20   Dg Knee Complete 4 Views Left  Result Date: 06/14/2017 CLINICAL DATA:  Nonhealing wound of the anterior left knee. History of arthritis. EXAM: LEFT KNEE - COMPLETE 4+ VIEW COMPARISON:  Radiographs 06/02/2017. FINDINGS: The bones are demineralized. There is no evidence of acute fracture, dislocation or bone destruction. Small soft tissue defect is seen anterior to the patella on the lateral view. There is no evidence of foreign body or  soft tissue emphysema. There is no significant joint effusion. Diffuse vascular calcifications are noted. IMPRESSION: Soft tissue defect anterior to the patella. No evidence of foreign body or osteomyelitis. Electronically Signed   By: Carey BullocksWilliam  Veazey M.D.   On: 06/14/2017 13:32   Dg Knee Complete 4 Views Left  Result Date: 06/02/2017 CLINICAL DATA:  Initial evaluation for acute trauma, fall. EXAM: LEFT KNEE - COMPLETE 4+ VIEW COMPARISON:  None. FINDINGS: No acute fracture or dislocation. No joint effusion. No acute soft tissue abnormality. Diffuse osteopenia. Prominent vascular calcifications  noted within the leg. IMPRESSION: No acute osseous abnormality about the left knee. Electronically Signed   By: Rise MuBenjamin  McClintock M.D.   On: 06/02/2017 04:22   Ct Maxillofacial Wo Contrast  Result Date: 06/02/2017 CLINICAL DATA:  Initial evaluation for acute trauma, fall. EXAM: CT HEAD WITHOUT CONTRAST CT MAXILLOFACIAL WITHOUT CONTRAST CT CERVICAL SPINE WITHOUT CONTRAST TECHNIQUE: Multidetector CT imaging of the head, cervical spine, and maxillofacial structures were performed using the standard protocol without intravenous contrast. Multiplanar CT image reconstructions of the cervical spine and maxillofacial structures were also generated. COMPARISON:  Prior CT from 04/23/2016. FINDINGS: CT HEAD FINDINGS Brain: Generalized age related cerebral atrophy. Moderate chronic small vessel ischemic disease. Remote infarcts involving the right occipital lobe and right cerebellar hemisphere noted. No acute intracranial hemorrhage. No evidence for acute large vessel territory infarct. No mass lesion, midline shift or mass effect. No hydrocephalus. No extra-axial fluid collection. Vascular: No hyperdense vessel. Calcified atherosclerosis at the skullbase. Skull: Soft tissue contusion present at the left forehead/ nasal bridge. Scalp soft tissues otherwise unremarkable. Calvarium intact. Other: Mastoid air cells are clear. CT MAXILLOFACIAL FINDINGS Osseous: Zygomatic arches intact. No acute maxillary fracture. Pterygoid plates intact. Lucencies extending through the bilateral nasal bones, suspicious for acute nondisplaced fractures. Probable acute nondisplaced fracture of the anterior nasal septum (series 8, image 52). Mandible intact. Mandibular condyles normally situated. No acute abnormality about the remaining dentition. Scattered dental caries noted. Orbits: Globes and orbital soft tissues within normal limits. Patient status post lens extraction bilaterally. Bony orbits intact without orbital floor fracture.  Sinuses: Clear.  No hemosinus. Soft tissues: Soft tissue contusion at the left forehead extending towards the nasal bridge and about the nose. CT CERVICAL SPINE FINDINGS Alignment: Straightening of the normal cervical lordosis. 3 mm anterolisthesis of C3 on C4 likely due to chronic facet degeneration. Skull base and vertebrae: Skullbase intact. Normal C1-2 articulations are preserved in the dens is intact. Vertebral body heights are maintained. No acute fracture. Lucency through the right facet of C4 appears corticated common is likely chronic in nature (series 16, image 13). Soft tissues and spinal canal: Soft tissues of the neck demonstrate no acute abnormality. No abnormal prevertebral edema. Spinal canal within normal limits. Vascular calcifications present about the carotid bifurcations. Disc levels: Moderate degenerative spondylolysis at C3-4 through C6-7. Multilevel facet arthrosis, greatest at C3-4. Upper chest: Visualized upper chest within normal limits. Partially visualized lungs are clear. Emphysema noted. Other: None. IMPRESSION: CT HEAD: 1. No acute intracranial process. 2. Remote right occipital and cerebellar infarcts. 3. Moderate cerebral atrophy with chronic small vessel ischemic disease. CT MAXILLOFACIAL: 1. Acute nondisplaced bilateral nasal bone fractures, with additional nondisplaced fracture of the anterior nasal septum. 2. Soft tissue contusion extending from the left forehead inferiorly to involve the nasal bridge and nose. 3. No other acute maxillofacial injury. CT CERVICAL SPINE: 1. No acute traumatic injury within the cervical spine. 2. Straightening of the normal cervical lordosis with moderate  degenerate spondylolysis at C3-4 through C6-7. 3. Emphysema. Electronically Signed   By: Rise Mu M.D.   On: 06/02/2017 05:20    ASSESSMENT/PLAN:  1. Insomnia, unspecified type - will start Melatonin 3 mg 1 tab PO Q HS   2. Edema of left lower extremity - will do doppler  ultrasound of LLE to rule out DVT   3. S/P Fall - fall precautions, will decrease Tramadol 50 mg from Q 6 hours PRN to BID PRN    Ahad Colarusso C. Medina-Vargas - NP    [employer] [phone]

## 2017-07-08 ENCOUNTER — Encounter: Payer: Self-pay | Admitting: Adult Health

## 2017-07-08 ENCOUNTER — Non-Acute Institutional Stay (SKILLED_NURSING_FACILITY): Payer: Medicare Other | Admitting: Adult Health

## 2017-07-08 DIAGNOSIS — R5381 Other malaise: Secondary | ICD-10-CM

## 2017-07-08 DIAGNOSIS — R001 Bradycardia, unspecified: Secondary | ICD-10-CM

## 2017-07-08 DIAGNOSIS — K219 Gastro-esophageal reflux disease without esophagitis: Secondary | ICD-10-CM

## 2017-07-08 DIAGNOSIS — L02416 Cutaneous abscess of left lower limb: Secondary | ICD-10-CM | POA: Diagnosis not present

## 2017-07-08 DIAGNOSIS — G4700 Insomnia, unspecified: Secondary | ICD-10-CM

## 2017-07-08 DIAGNOSIS — F0391 Unspecified dementia with behavioral disturbance: Secondary | ICD-10-CM | POA: Diagnosis not present

## 2017-07-08 DIAGNOSIS — J449 Chronic obstructive pulmonary disease, unspecified: Secondary | ICD-10-CM | POA: Diagnosis not present

## 2017-07-08 MED ORDER — TRAMADOL HCL 50 MG PO TABS: 50.0000 mg | ORAL_TABLET | Freq: Two times a day (BID) | ORAL | 0 refills | Status: AC | PRN

## 2017-07-08 NOTE — Progress Notes (Addendum)
DATE:  07/08/2017   MRN:  [id number]  BIRTHDAY: 17-May-1923  Facility:  Nursing Home Location:  [facility] Room Number: 220-A  LEVEL OF CARE:  SNF (31)  Contact Information    Name Relation Home Work Mobile   Miles,Tiffany Niece   [phone]       Code Status History    Date Active Date Inactive Code Status Order ID Comments User Context   06/11/2017 18:26 06/15/2017 18:26 Full Code [id number]  Dorothea Ogle, MD Inpatient   04/15/2016 20:57 04/16/2016 22:59 Full Code [id number]  Briscoe Deutscher, MD ED   03/18/2016 18:14 03/21/2016 18:46 Full Code [id number]  Eddie North, MD Inpatient   02/22/2015 00:46 02/25/2015 17:30 Full Code [id number]  Ron Parker, MD Inpatient   10/03/2014 19:22 10/06/2014 17:50 Full Code [id number]  Leroy Sea, MD Inpatient   07/18/2014 22:44 07/20/2014 19:38 Full Code [id number]  Lynden Oxford, MD Inpatient   01/14/2014 08:02 01/18/2014 17:41 Full Code [id number]  Catarina Hartshorn, MD Inpatient   10/07/2013 17:23 10/17/2013 22:07 Full Code [id number]  Marinda Elk, MD Inpatient   06/09/2013 18:57 06/14/2013 19:15 Full Code [id number]  Leroy Sea, MD ED   12/02/2012 19:28 12/06/2012 20:48 Full Code [id number]  Clydia Llano, MD Inpatient    Advance Directive Documentation     Most Recent Value  Type of Advance Directive  Out of facility DNR (pink MOST or yellow form)  Pre-existing out of facility DNR order (yellow form or pink MOST form)  No data  "MOST" Form in Place?  No data       Chief Complaint  Patient presents with  . Discharge Note    Patient is transferring to an ALF    HISTORY OF PRESENT ILLNESS:  This is a 89-YO female seen for discharge.  She is transferring to [facility] on 07/12/17 with home health PT, OT, and nursing services.    She has been admitted to [facility] on  06/15/17 from the hospital with abscess on left knee which was drained in the ER.  She has now completed her antibiotic therapy  Patient was admitted to this facility for short-term rehabilitation after the patient's recent hospitalization.  Patient has completed SNF rehabilitation and therapy has cleared the patient for discharge.   PAST MEDICAL HISTORY:  Past Medical History:  Diagnosis Date  . Arthritis   . Chronic bronchitis (HCC)   . COPD (chronic obstructive pulmonary disease) (HCC)   . Dementia   . GERD (gastroesophageal reflux disease)   . Gout   . H/O hiatal hernia   . High cholesterol   . Hypertension      CURRENT MEDICATIONS: Reviewed    Medication List        Accurate as of 07/08/17  2:51 PM. Always use your most recent med list.          acetaminophen 325 MG tablet Commonly known as:  TYLENOL   albuterol 108 (90 Base) MCG/ACT inhaler Commonly known as:  PROVENTIL HFA;VENTOLIN HFA Inhale 2 puffs into the lungs every 4 (four) hours as needed for wheezing or shortness of breath.   atorvastatin 20 MG tablet Commonly known as:  LIPITOR   docusate sodium 100 MG capsule Commonly known as:  COLACE Take 1 capsule (100 mg total) by mouth every 12 (twelve) hours.   ipratropium-albuterol 0.5-2.5 (3) MG/3ML Soln Commonly known as:  DUONEB Take 3 mLs by nebulization  every 6 (six) hours as needed.   Melatonin 3 MG Tabs   * NUTRITIONAL SUPPLEMENT Liqd   * NUTRITIONAL SUPPLEMENT PO   OLANZapine 5 MG tablet Commonly known as:  ZYPREXA   pantoprazole 40 MG tablet Commonly known as:  PROTONIX TAKE 1 TABLET BY MOUTH EVERY DAY 30-60 MINUTES BEFORE FIRST MEAL OF THE DAY   polyethylene glycol packet Commonly known as:  MIRALAX Take 17 g by mouth daily.   traMADol 50 MG tablet Commonly known as:  ULTRAM      * This list has 2 medication(s) that are the same as other medications prescribed for you. Read the directions carefully, and ask your doctor or other care provider to review them with you.           Allergies  Allergen Reactions   . Penicillins Rash    Tolerated ceftriaxone/cefepime Has patient had a PCN reaction causing immediate rash, facial/tongue/throat swelling, SOB or lightheadedness with hypotension: Yes Has patient had a PCN reaction causing severe rash involving mucus membranes or skin necrosis: Unk Has patient had a PCN reaction that required hospitalization: Unk Has patient had a PCN reaction occurring within the last 10 years: Unk if all of the above answers are "NO", then may proceed with Cephalosporin use.      REVIEW OF SYSTEMS:  Unable to obtain due to dementia     PHYSICAL EXAMINATION  GENERAL APPEARANCE: Well nourished. In no acute distress. Normal body habitus SKIN:  Left knee with scab, covered with dry dressing  MOUTH and THROAT: Lips are without lesions. Oral mucosa is moist and without lesions.  RESPIRATORY: breathing is even & unlabored, BS CTAB CARDIAC: RRR, no murmur,no extra heart sounds, no edema GI: abdomen soft, normal BS, no masses, no tenderness, no hepatomegaly, no splenomegaly EXTREMITIES:  Able to move X 4 extremities PSYCHIATRIC: Alert to self, disoriented to time and place. Affect and behavior are appropriate    LABS/RADIOLOGY: Labs reviewed: Basic Metabolic Panel: Recent Labs    06/13/17 0346 06/14/17 0425 06/15/17 0423  NA 141 143 139  K 4.2 3.9 4.5  CL 111 108 103  CO2 24 26 26   GLUCOSE 100* 82 88  BUN 36* 29* 28*  CREATININE 1.18* 0.87 1.17*  CALCIUM 8.7* 9.0 8.9   CBC: Recent Labs    12/01/16 1055 06/02/17 0500 06/11/17 1353  06/13/17 0346 06/14/17 0425 06/15/17 0423  WBC 16.9* 7.8 7.5   < > 7.4 8.9 8.3  NEUTROABS 14.6* 5.5 4.9  --   --   --   --   HGB 11.1* 11.1* 10.2*   < > 9.3* 10.0* 10.6*  HCT 34.1* 34.8* 31.3*   < > 29.0* 30.4* 32.1*  MCV 91.9 88.8 88.7   < > 88.7 87.1 87.7  PLT 233 212 358   < > 319 345 327   < > = values in this interval not displayed.    Dg Knee Complete 4 Views Left  Result Date: 06/14/2017 CLINICAL DATA:   Nonhealing wound of the anterior left knee. History of arthritis. EXAM: LEFT KNEE - COMPLETE 4+ VIEW COMPARISON:  Radiographs 06/02/2017. FINDINGS: The bones are demineralized. There is no evidence of acute fracture, dislocation or bone destruction. Small soft tissue defect is seen anterior to the patella on the lateral view. There is no evidence of foreign body or soft tissue emphysema. There is no significant joint effusion. Diffuse vascular calcifications are noted. IMPRESSION: Soft tissue defect anterior to the patella. No evidence  of foreign body or osteomyelitis. Electronically Signed   By: Carey BullocksWilliam  Veazey M.D.   On: 06/14/2017 13:32    ASSESSMENT/PLAN:  1. Physical deconditioning -  home health PT and OT for therapeutic strengthening exercises, fall precautions   2. Abscess of left knee - S/P drainage, completed course of antibiotic, continue tramadol 50 mg 1 tab twice a day when necessary   3. COPD GOLD I with reversibility  - no SOB nor wheezing, continue pro-air when necessary   4. Bradycardia - atenolol was stopped   5. Insomnia, unspecified type - continue melatonin 3 mg 1 tab daily at bedtime   6. Dementia with behavioral disturbance, unspecified dementia type - continue olanzapine 5 mg 1 tab daily at bedtime, supportive care and fall precautions   7. Gastroesophageal reflux disease without esophagitis - stable, continue Protonix 40 mg daily     I have filled out patient's discharge paperwork and written prescriptions.  Patient will receive home health PT, OT,  And Nursing.  DME provided:  wheelchair  Total discharge time: Greater than 30 minutes Greater than 50% was spent in counseling and coordination of care.   Discharge time involved coordination of the discharge process with social worker, nursing staff and therapy department. Medical justification for home health services/DME verified.    Tyton Abdallah C. Medina-Vargas - NP   [facility] [phone]

## 2017-08-29 IMAGING — DX DG CHEST 2V
2 series · 2 of 2 positions shown · non-contrast
Comparison: Radiographs February 23, 2016.

CLINICAL DATA: Shortness of breath.

EXAM:
CHEST  2 VIEW

[chest lat]
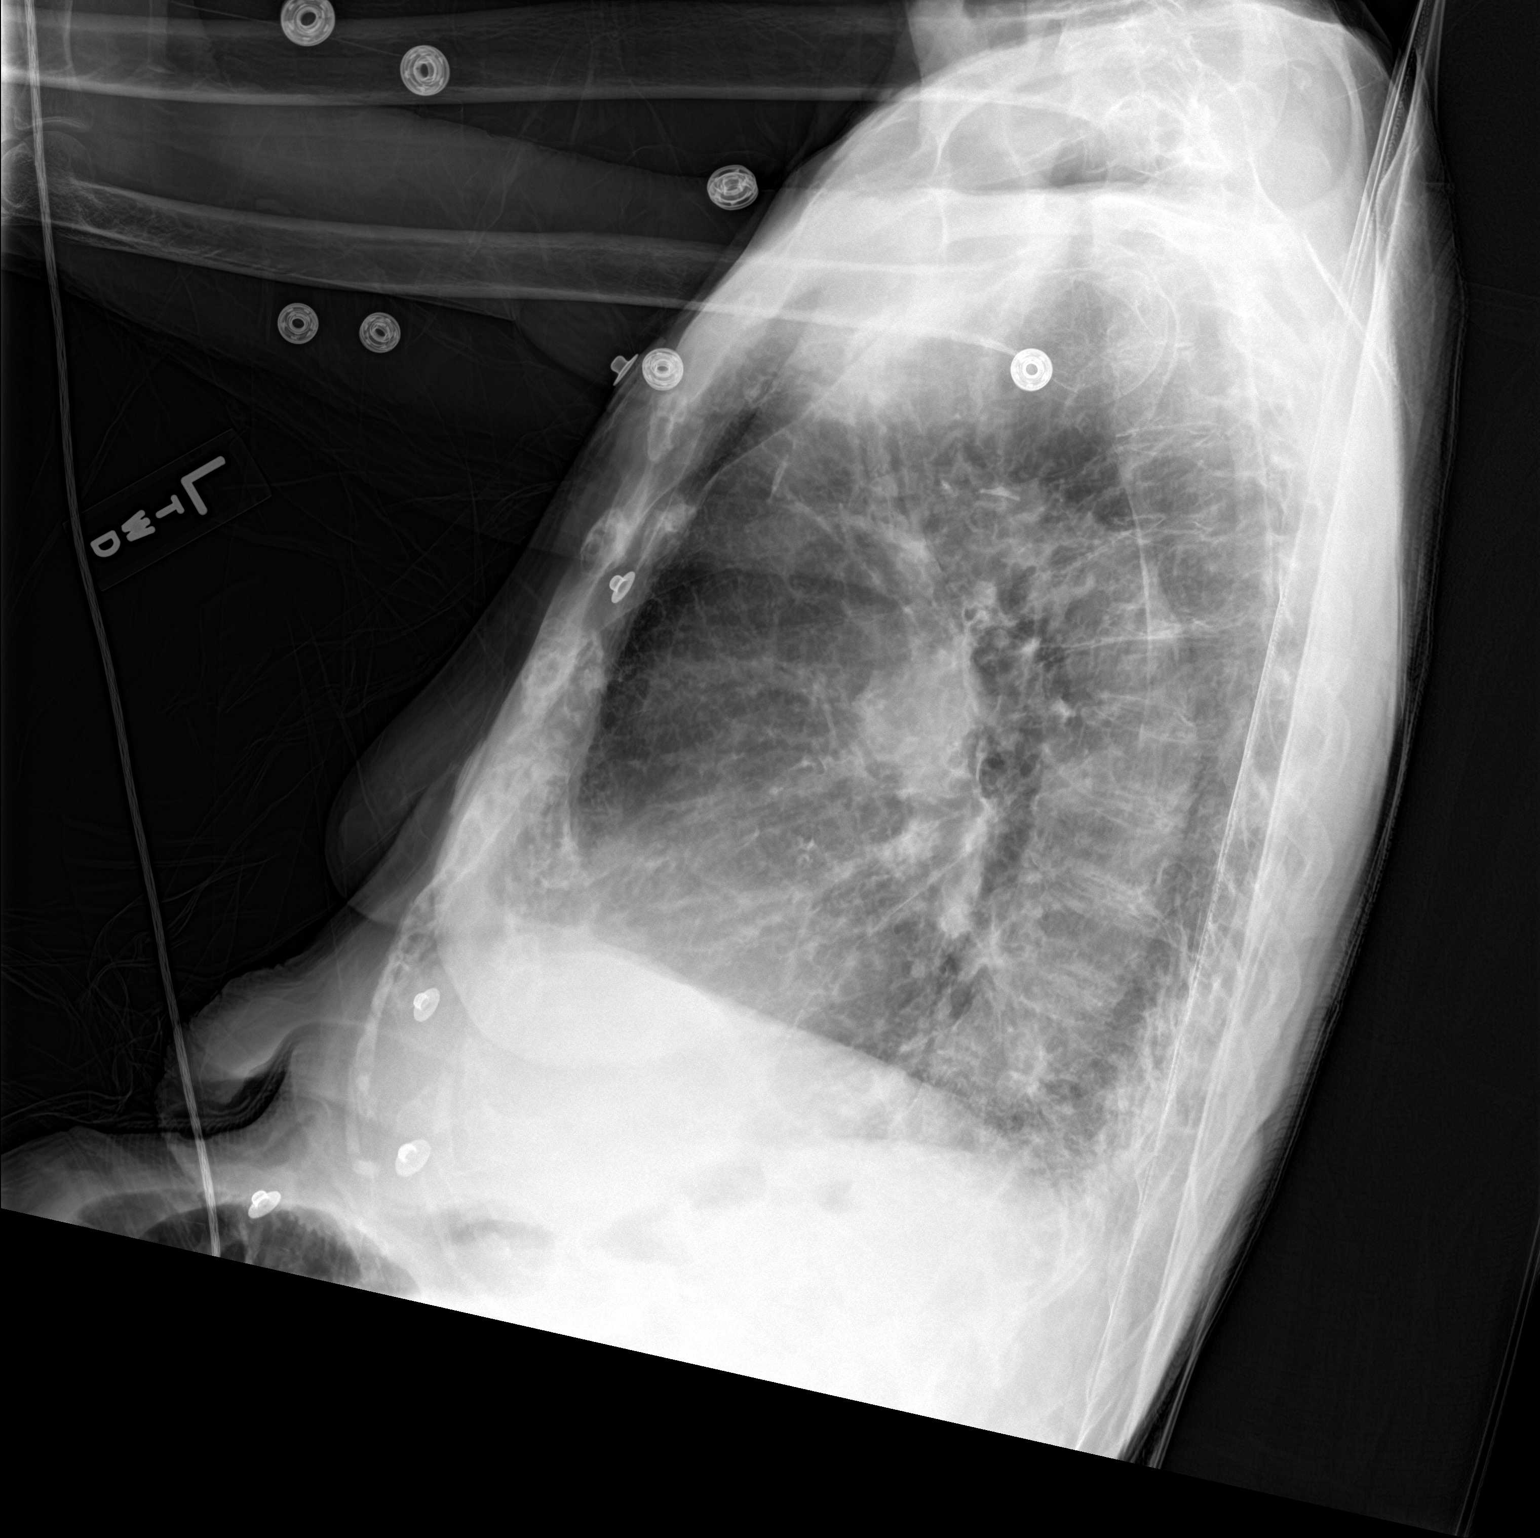

[chest ap]
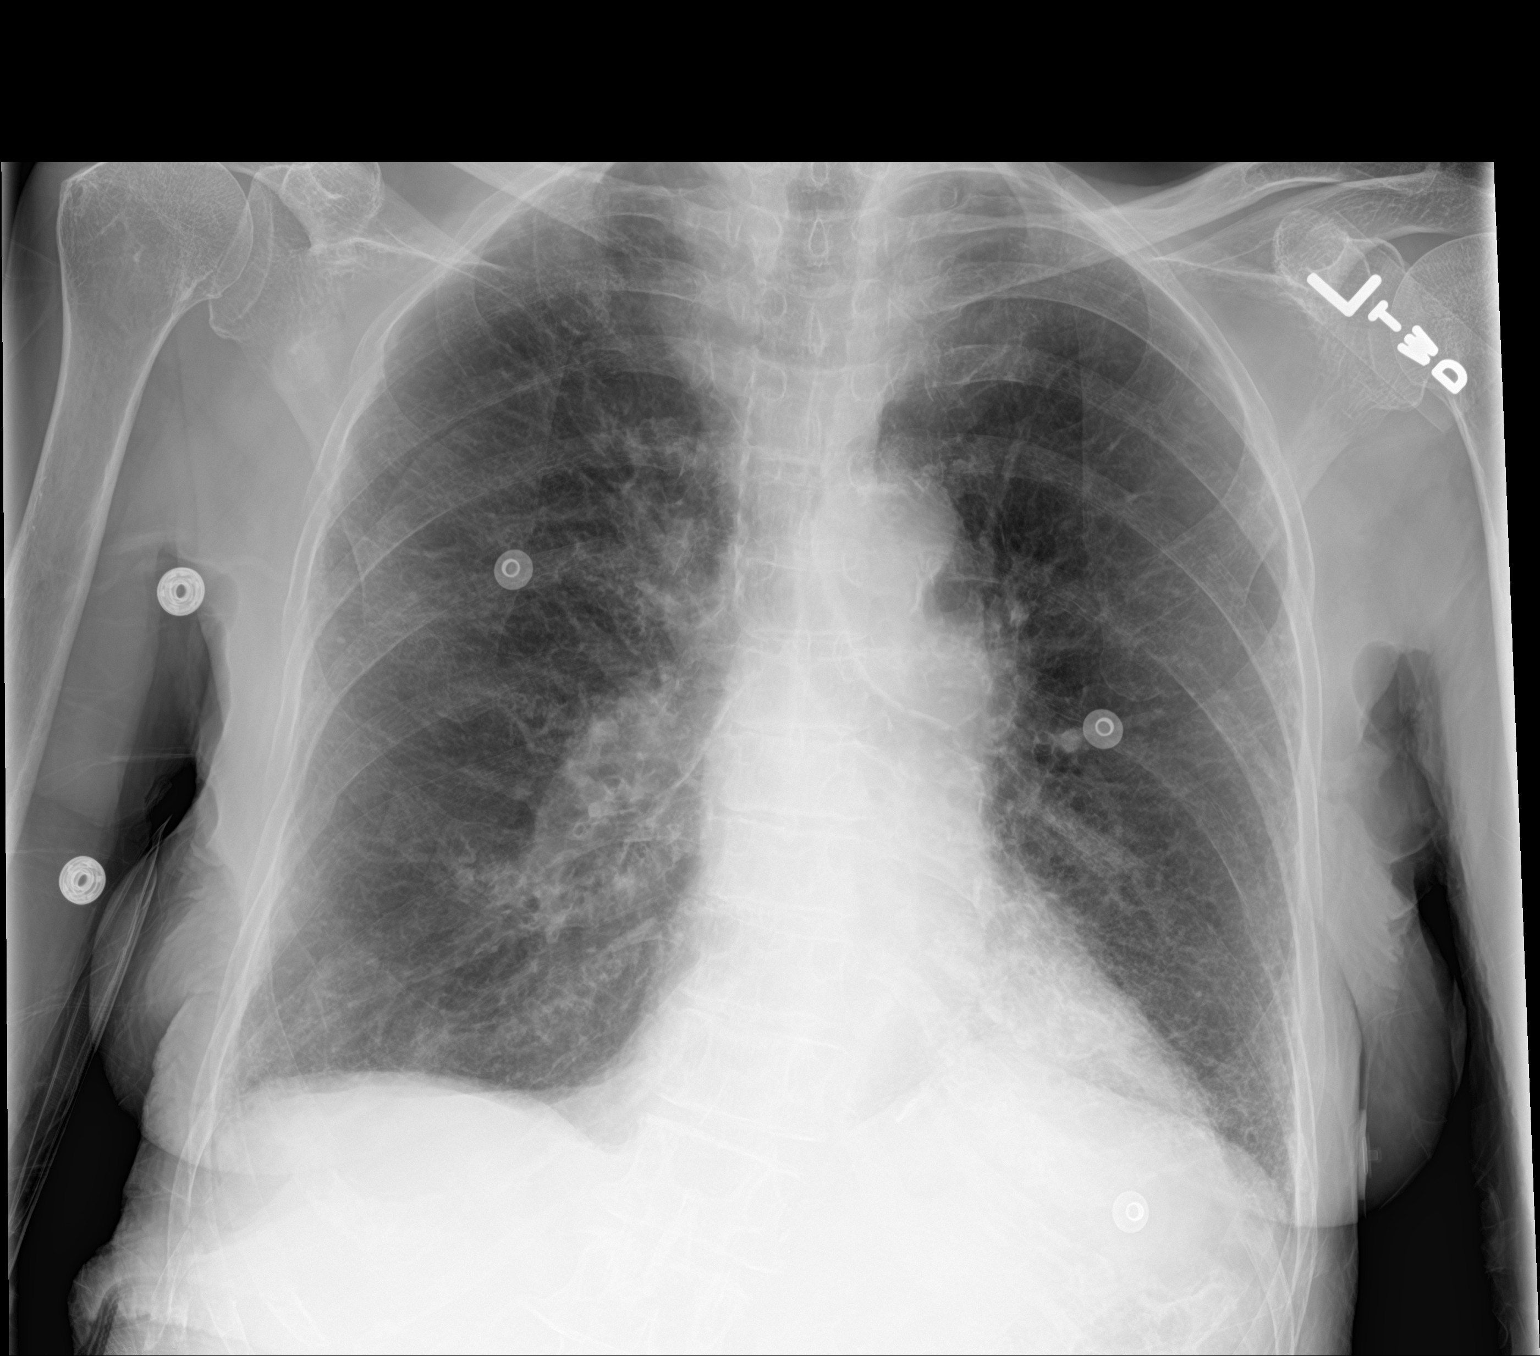

[2 of 2 positions shown; findings below may reference images not displayed]

FINDINGS: The heart size and mediastinal contours are within normal limits.
Atherosclerosis of thoracic aorta is noted. No pneumothorax or
pleural effusion is noted. Mild interstitial densities are noted in
the perihilar and basilar regions which may represent edema. The
visualized skeletal structures are unremarkable.
IMPRESSION: Possible mild bilateral perihilar and basilar edema. Aortic
atherosclerosis.

## 2017-09-26 IMAGING — CR DG CHEST 2V
3 series · 3 of 3 positions shown · non-contrast
Comparison: 03/20/2016 chest radiograph.

CLINICAL DATA: [AGE]/o F; weakness today. History of chronic
bronchitis, COPD, hiatal hernia.

EXAM:
CHEST  2 VIEW

[w chest lat (1 of 2)]
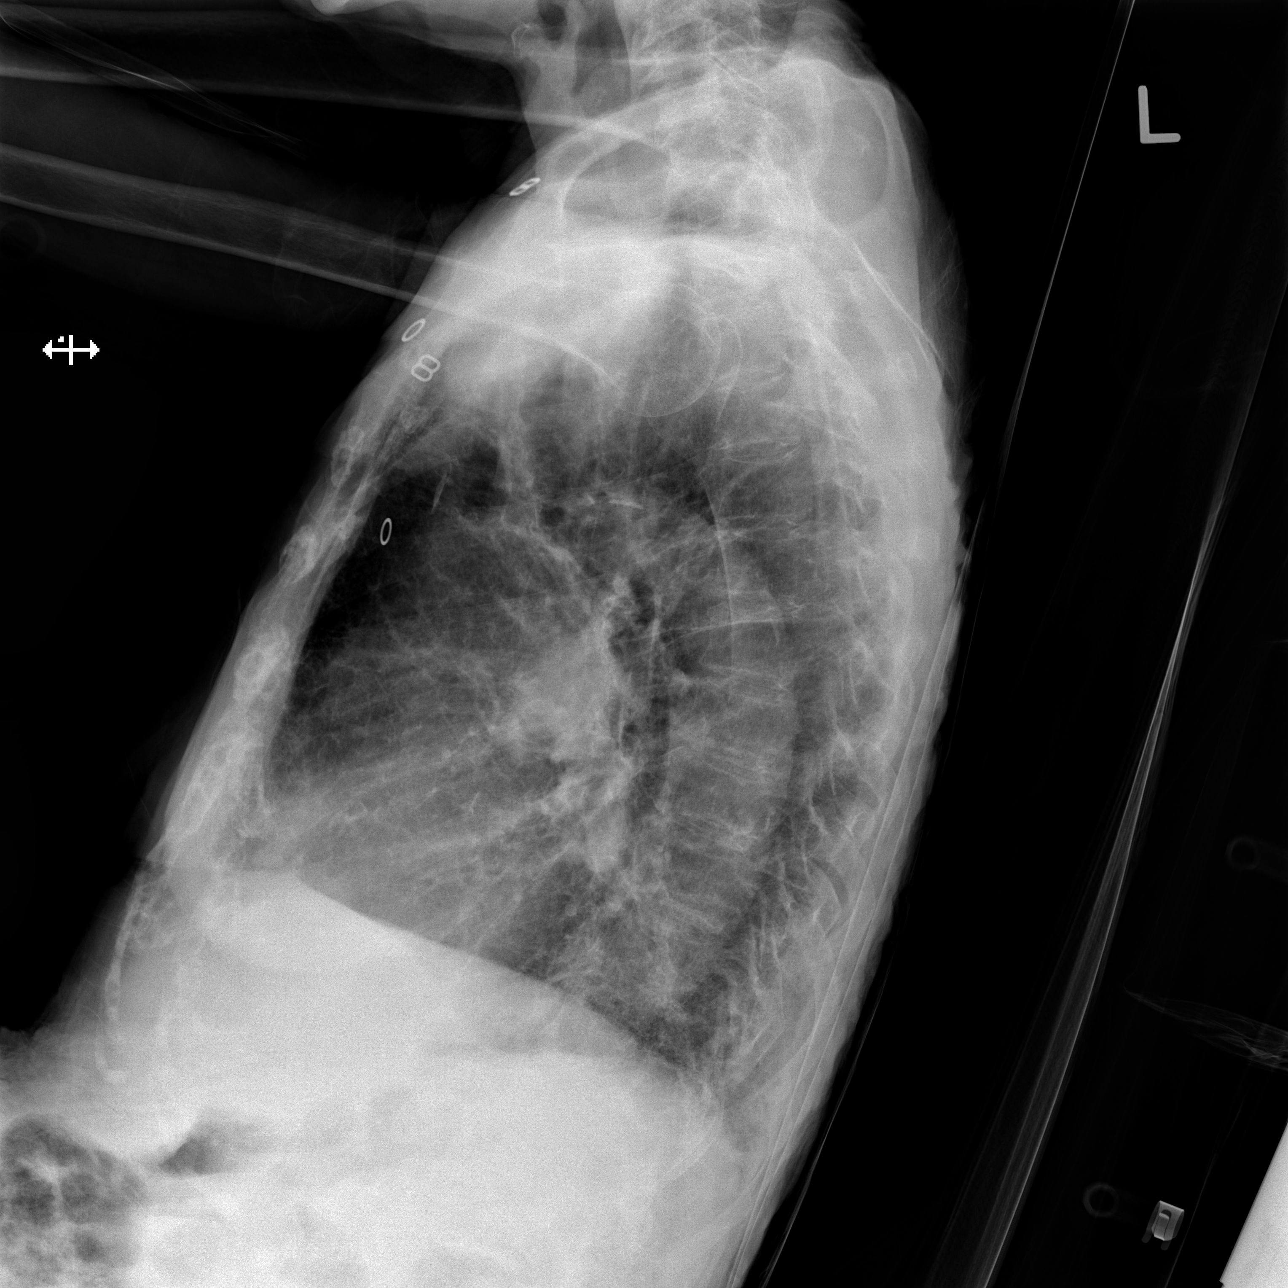

[w chest lat (2 of 2)]
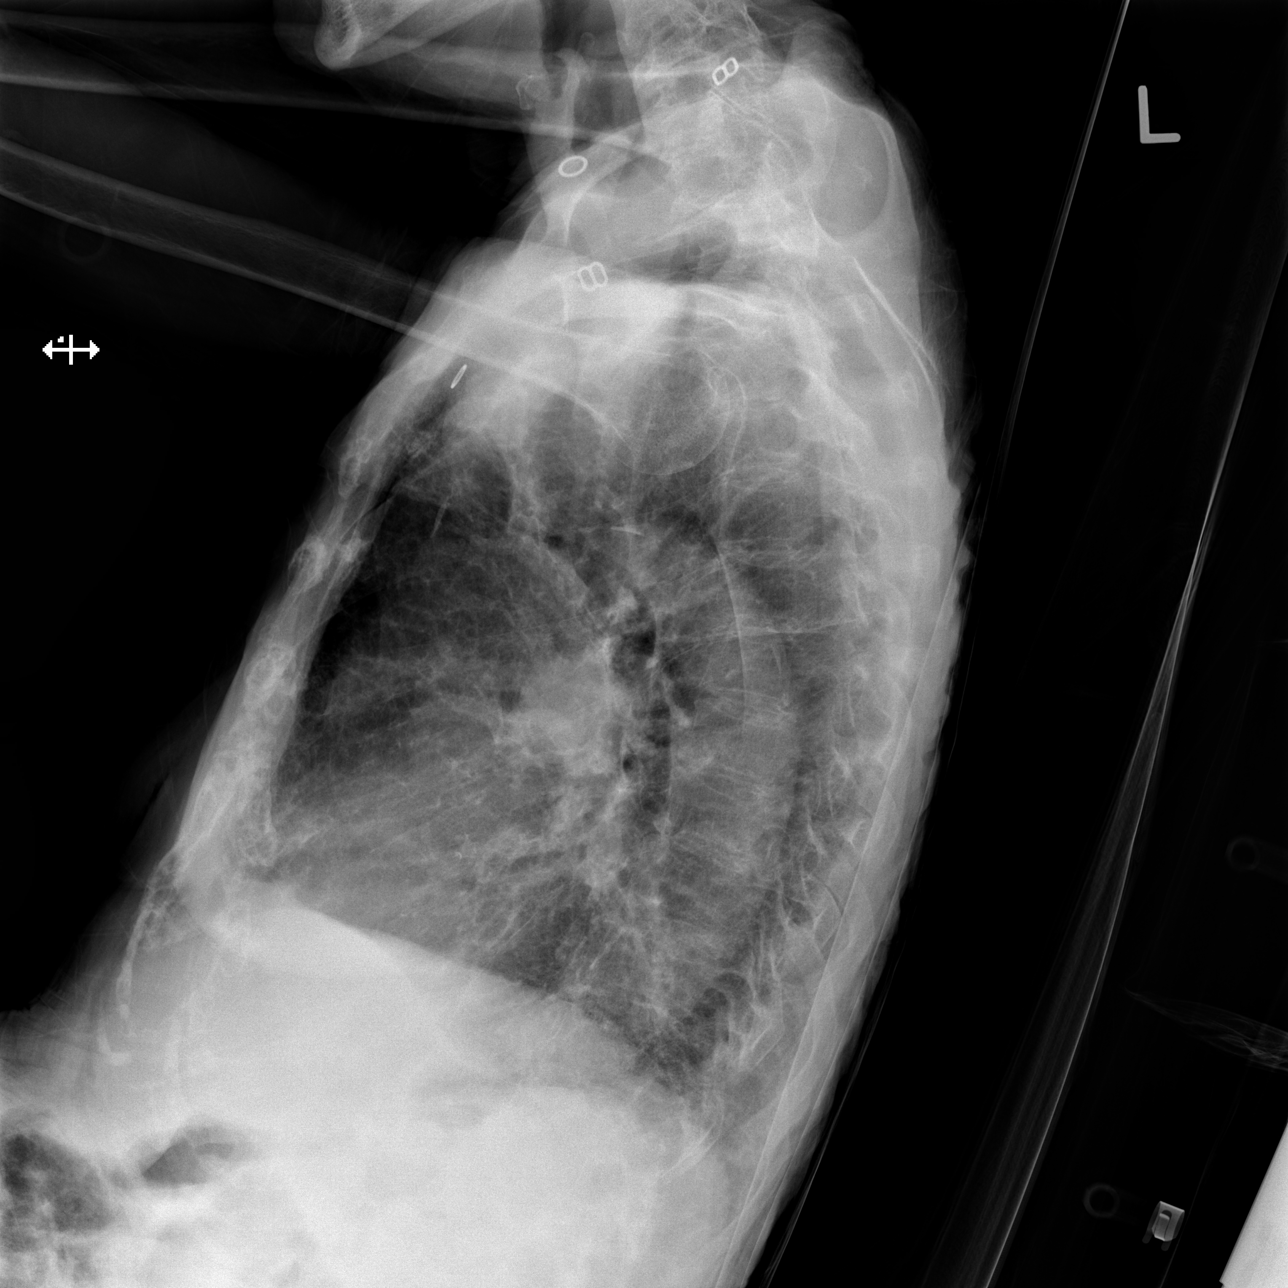

[x chest ap]
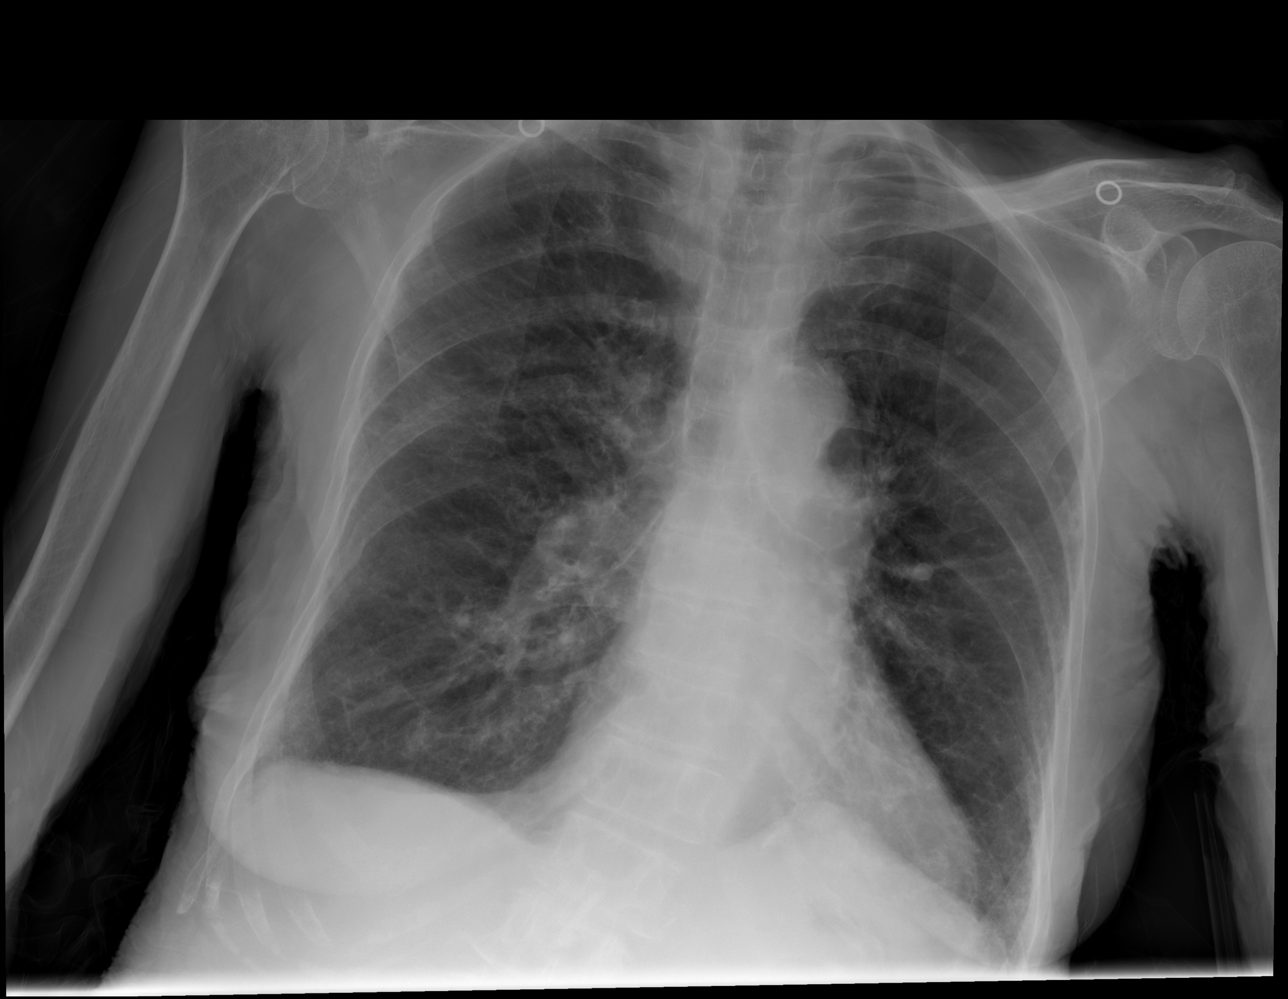

[3 of 3 positions shown; findings below may reference images not displayed]

FINDINGS: No consolidation, pneumothorax, or pleural effusion. Calcific aortic
atherosclerosis. Stable cardiac silhouette given projection.
Hyperinflated lungs, coarse lung markings, and flattened diaphragms
compatible with history COPD. No acute osseous abnormality is
evident.
IMPRESSION: No active cardiopulmonary disease. Findings of COPD. Aortic
atherosclerosis.

By: Janusz Trigo M.D.

## 2017-10-16 DEATH — deceased
